# Patient Record
Sex: Female | Born: 1945
Health system: Southern US, Community
[De-identification: ages and names within clinical notes are randomized; demographics above are authoritative.]

## PROBLEM LIST (undated history)

## (undated) DIAGNOSIS — E669 Obesity, unspecified: Secondary | ICD-10-CM

## (undated) DIAGNOSIS — I350 Nonrheumatic aortic (valve) stenosis: Secondary | ICD-10-CM

## (undated) DIAGNOSIS — Z953 Presence of xenogenic heart valve: Secondary | ICD-10-CM

## (undated) DIAGNOSIS — M810 Age-related osteoporosis without current pathological fracture: Secondary | ICD-10-CM

## (undated) DIAGNOSIS — I10 Essential (primary) hypertension: Secondary | ICD-10-CM

## (undated) DIAGNOSIS — R0600 Dyspnea, unspecified: Secondary | ICD-10-CM

## (undated) DIAGNOSIS — E119 Type 2 diabetes mellitus without complications: Secondary | ICD-10-CM

## (undated) DIAGNOSIS — H269 Unspecified cataract: Secondary | ICD-10-CM

## (undated) HISTORY — DX: Essential (primary) hypertension: I10

## (undated) HISTORY — PX: WRIST SURGERY: SHX841

## (undated) HISTORY — DX: Age-related osteoporosis without current pathological fracture: M81.0

## (undated) HISTORY — PX: FRACTURE SURGERY: SHX138

## (undated) HISTORY — DX: Type 2 diabetes mellitus without complications: E11.9

## (undated) HISTORY — DX: Nonrheumatic aortic (valve) stenosis: I35.0

## (undated) HISTORY — DX: Unspecified cataract: H26.9

## (undated) HISTORY — PX: ABDOMINAL HYSTERECTOMY: SHX81

## (undated) HISTORY — PX: APPENDECTOMY: SHX54

## (undated) HISTORY — PX: TOTAL ABDOMINAL HYSTERECTOMY: SHX209

---

## 1998-06-06 ENCOUNTER — Encounter: Admission: RE | Admit: 1998-06-06 | Discharge: 1998-09-04 | Payer: Self-pay | Admitting: Orthopedic Surgery

## 1999-05-01 ENCOUNTER — Other Ambulatory Visit: Admission: RE | Admit: 1999-05-01 | Discharge: 1999-05-01 | Payer: Self-pay | Admitting: Obstetrics and Gynecology

## 2000-05-16 ENCOUNTER — Other Ambulatory Visit: Admission: RE | Admit: 2000-05-16 | Discharge: 2000-05-16 | Payer: Self-pay | Admitting: Family Medicine

## 2002-05-20 ENCOUNTER — Other Ambulatory Visit: Admission: RE | Admit: 2002-05-20 | Discharge: 2002-05-20 | Payer: Self-pay | Admitting: Family Medicine

## 2003-07-13 ENCOUNTER — Other Ambulatory Visit: Admission: RE | Admit: 2003-07-13 | Discharge: 2003-07-13 | Payer: Self-pay | Admitting: Family Medicine

## 2006-05-06 ENCOUNTER — Other Ambulatory Visit: Admission: RE | Admit: 2006-05-06 | Discharge: 2006-05-06 | Payer: Self-pay | Admitting: Family Medicine

## 2008-12-09 ENCOUNTER — Encounter: Admission: RE | Admit: 2008-12-09 | Discharge: 2008-12-09 | Payer: Self-pay | Admitting: Orthopedic Surgery

## 2008-12-24 ENCOUNTER — Ambulatory Visit (HOSPITAL_COMMUNITY): Admission: RE | Admit: 2008-12-24 | Discharge: 2008-12-26 | Payer: Self-pay | Admitting: Orthopedic Surgery

## 2009-02-08 ENCOUNTER — Encounter: Admission: RE | Admit: 2009-02-08 | Discharge: 2009-05-09 | Payer: Self-pay | Admitting: Orthopedic Surgery

## 2009-05-10 ENCOUNTER — Encounter: Admission: RE | Admit: 2009-05-10 | Discharge: 2009-05-18 | Payer: Self-pay | Admitting: Orthopedic Surgery

## 2010-06-05 ENCOUNTER — Encounter: Admission: RE | Admit: 2010-06-05 | Discharge: 2010-06-05 | Payer: Self-pay | Admitting: Orthopedic Surgery

## 2010-07-11 ENCOUNTER — Encounter: Admission: RE | Admit: 2010-07-11 | Discharge: 2010-10-09 | Payer: Self-pay | Admitting: Orthopedic Surgery

## 2011-03-06 LAB — GLUCOSE, CAPILLARY
Glucose-Capillary: 147 mg/dL — ABNORMAL HIGH (ref 70–99)
Glucose-Capillary: 163 mg/dL — ABNORMAL HIGH (ref 70–99)
Glucose-Capillary: 176 mg/dL — ABNORMAL HIGH (ref 70–99)
Glucose-Capillary: 181 mg/dL — ABNORMAL HIGH (ref 70–99)
Glucose-Capillary: 187 mg/dL — ABNORMAL HIGH (ref 70–99)
Glucose-Capillary: 197 mg/dL — ABNORMAL HIGH (ref 70–99)

## 2011-03-06 LAB — CBC
HCT: 37.9 % (ref 36.0–46.0)
Hemoglobin: 11.4 g/dL — ABNORMAL LOW (ref 12.0–15.0)
Hemoglobin: 12.8 g/dL (ref 12.0–15.0)
MCV: 91.6 fL (ref 78.0–100.0)
RBC: 4.19 MIL/uL (ref 3.87–5.11)
RDW: 13.7 % (ref 11.5–15.5)

## 2011-03-06 LAB — URINALYSIS, ROUTINE W REFLEX MICROSCOPIC
Hgb urine dipstick: NEGATIVE
Ketones, ur: 15 mg/dL — AB
Nitrite: NEGATIVE
Protein, ur: NEGATIVE mg/dL
Specific Gravity, Urine: 1.03 (ref 1.005–1.030)
pH: 7 (ref 5.0–8.0)

## 2011-03-06 LAB — COMPREHENSIVE METABOLIC PANEL
AST: 16 U/L (ref 0–37)
BUN: 17 mg/dL (ref 6–23)
Sodium: 141 mEq/L (ref 135–145)

## 2011-03-06 LAB — BASIC METABOLIC PANEL
BUN: 14 mg/dL (ref 6–23)
CO2: 29 mEq/L (ref 19–32)
Calcium: 8.8 mg/dL (ref 8.4–10.5)
Creatinine, Ser: 0.8 mg/dL (ref 0.4–1.2)
Creatinine, Ser: 0.85 mg/dL (ref 0.4–1.2)
GFR calc Af Amer: >60 mL/min (ref >60)
Glucose, Bld: 201 mg/dL — ABNORMAL HIGH (ref 70–99)
Sodium: 130 mEq/L — ABNORMAL LOW (ref 135–145)

## 2011-03-06 LAB — TYPE AND SCREEN: ABO/RH(D): A POS

## 2011-03-06 LAB — PROTIME-INR
INR: 1 (ref 0.00–1.49)
Prothrombin Time: 13.9 seconds (ref 11.6–15.2)

## 2011-03-06 LAB — APTT: aPTT: 31 seconds (ref 24–37)

## 2011-03-30 ENCOUNTER — Other Ambulatory Visit (INDEPENDENT_AMBULATORY_CARE_PROVIDER_SITE_OTHER): Payer: Medicare Other

## 2011-03-30 DIAGNOSIS — R0989 Other specified symptoms and signs involving the circulatory and respiratory systems: Secondary | ICD-10-CM

## 2011-04-03 NOTE — Op Note (Signed)
NAMEXIANNA, SIVERLING NO.:  [id number]   MEDICAL RECORD NO.:  [id number]          PATIENT TYPE:  INP   LOCATION:  5016                         FACILITY:  [facility]   PHYSICIAN:  [name], M.D. DATE OF BIRTH:  05-30-1946   DATE OF PROCEDURE:  12/24/2008  DATE OF DISCHARGE:                               OPERATIVE REPORT   PREOPERATIVE DIAGNOSIS:  Right shoulder proximal humerus fracture,  nonunion.   POSTOPERATIVE DIAGNOSIS:  Right shoulder proximal humerus fracture,  nonunion.   PROCEDURE PERFORMED:  Right shoulder hemiarthroplasty using [employer]  Fx System.   ATTENDING SURGEON:  [name], MD.   ASSISTANT:  Donnie Coffin. Dixon, PA-C   ANESTHESIA:  General anesthesia was used plus interscalene block.   ESTIMATED BLOOD LOSS:  Minimal.   FLUID REPLACEMENT:  1500 mL of crystalloid.   INSTRUMENT COUNTS:  Correct.   COMPLICATIONS:  None.   Preoperative antibiotics were given.   INDICATIONS:  The patient is a 65 year old female with a history of  right shoulder injury 10 years ago.  The patient has had a manipulation  under anesthesia.  At that time, has had some pain in the shoulder and  difficulty lying on since that time, but recently fell last week and had  an immediate decline in her function and had significant increase in her  pain where she is essentially debilitated by her pain.  She presented to  Orthopedics where she was noted to have what looked to be a nonunion of  the proximal humerus fracture with significant erosion of the  subchondral bone up under the humeral head and malrotation of the  humeral head both in cocking into  more varus and also into  retroversion.  Based on the poor position of the patient's humeral head  and the fact that it appeared she had an established nonunion, we talked  to the patient regarding options for treatment and recommended surgical  management.  She agreed and informed consent was obtained.   DESCRIPTION OF PROCEDURE:  After adequate level of anesthesia was  achieved, the patient was positioned in the modified beach chair  position.  The right shoulder was sterilely prepped and draped in the  usual manner.  We utilized a deltopectoral approach, starting at the  coracoid process, extended down to the anterior humeral shaft using a 10  blade.  Dissection down through subcutaneous using Bovie.  Cephalic vein  identified, taken laterally with the deltoid, pectoralis taken medially,  __________ released.  Conjoined tendon taken medially and identified the  bicipital groove.  We incised the soft tissue before the biceps groove  and then osteotomized the lesser tuberosity with the subscapularis  attached to it.  We freed up the subscapularis from surrounding capsular  tissue and the undersurface of the coracoid.  Placed two #2 FiberWire  sutures in the modified W stitch with suture limbs coming out over the  top of the greater tuberosity at the junction of the tendon and the  bone.  At this point, we went ahead and identified the greater  tuberosity and osteotomized that from the humeral head and placed two #2  FiberWire sutures in the modified W stitch in that at the tendon bone  junction as well, coming out over the top of the greater tuberosity.  Next, we went ahead and removed the humeral head.  We sized it to size  44.18.  We then prepared the humerus with sequential reaming up to size  10 and then placed a 10 body with a 44.18 head, and noted that to be  appropriately balanced and appropriate length at 5-1/2 laser marks to  the bicipital groove bone interface.  At this point, we went ahead and  thoroughly irrigated and removed all extraneous debris.  We went ahead  at this point and cemented the stem into place after placing two #2  FiberWire sutures through drill holes in the anterior humeral shaft.  Once the cement was allowed to harden, we placed the 44.18 head and  impacted  that into place.  The anterior fin had been aligned with the  biceps groove.  We then extensively bone grafted the anterior portion of  prosthesis.  We brought thearound the world suture through the medial  fin and lateral to the greater tuberosity and medial to the lesser  tuberosity.  We then placed two #2 FiberWire sutures in a figure-of-  eight into the rotator interval to secure the rotator interval and to  reinforce the upper portion of the tuberosity-to-tuberosity repair.  We  tied our tuberosity-to-tuberosity sutures.  We placed our shaft sutures  up through the subscapularis into the rotator cuff.  We tied around the  world stitch and we tied our figure-of-eight sutures, reinforcing the  rotator interval area.  We were happy with the soft tissue closure,  which was very tight.  Everything moved as a unit with arm in extension.  In external rotation, I could externally rotate to 30 degrees with no  problem.  I can forward flex up easily pass 90 degrees and internal  rotation easily to the abdomen.  We thoroughly irrigated and closed the  deltopectoral interval with 0 Vicryl suture followed by 2-0 Vicryl  subcutaneous closure and 4-0 Monocryl for skin.  Steri-Strips were  applied followed by sterile dressing.  The patient tolerated the surgery  well.      [name], M.D.  Electronically Signed     SRN/MEDQ  D:  12/24/2008  T:  12/25/2008  Job:  [id number]

## 2011-04-03 NOTE — H&P (Signed)
Toni Cooper, Toni Cooper NO.:  [id number]   MEDICAL RECORD NO.:  [id number]          PATIENT TYPE:  INP   LOCATION:  NA                           FACILITY:  [facility]   PHYSICIAN:  Almedia Balls. Toni Cooper, M.D. DATE OF BIRTH:  28-Jun-1946   DATE OF ADMISSION:  DATE OF DISCHARGE:                              HISTORY & PHYSICAL   CHIEF COMPLAINT:  Right shoulder pain.   HISTORY OF PRESENT ILLNESS:  The patient is a pleasant 65 year old  female with history of several injuries to the right shoulder.  The  patient states she had a recent fall on December 02, 2008, when she  landed directly on her right shoulder, complained about immediate pain  and inability to move that right shoulder secondary to pain, was  referred to our office, seen by Dr. Lequita Cooper and seen by Dr. Ranell Cooper.  After a CT scan demonstrated nonunion of a proximal humerus fracture  along with a recent humerus fracture, the patient elected to have  surgical management for that right shoulder.   MEDICAL HISTORY:  Hypertension and diabetes.   SURGICAL HISTORY:  The patient has a history of hysterectomy and  appendectomy.   CURRENT MEDICATIONS:  Lisinopril, hydrochlorothiazide, Glucophage,  Niaspan, Lipitor, Flonase, lorazepam, insulin, Ecotrin, and Ocuvite.   ALLERGIES:  SULFA.   SOCIAL HISTORY:  The patient does not smoke or use alcohol.  The patient  of [facility].   REVIEW OF SYSTEMS:  Pain with range of motion of the right upper  extremity.   PHYSICAL EXAMINATION:  Pulse 90, respirations 18, and blood pressure  142/82 here in the office today.  The patient is a healthy and alert,  appropriate 65 year old female, in no acute distress.  Pleasant mood and  affect, oriented x3.  Examination of the cervical spine shows no  tenderness to palpation over the paraspinal muscle or spinous process.  She has full range of motion and difficulty.  Cranial nerves II-XII  grossly intact.  Examination of  the right upper extremity shows moderate  edema and ecchymosis extending down to the right elbow and right hand  secondary to known fracture.  This was not taken through tremendous  range of motion secondary to known fracture.  Capillary fill was 2  seconds.  Breath sounds are equal bilaterally.  Abdomen is nontender and  nondistended with active bowel sounds.  She has no rashes or edema.  The  rest of physical exam is negative.   X-ray showed nonunion of an old proximal humerus fracture along with a  recent fracture of the nonunion area.   IMPRESSION:  Nonunion right proximal humerus fracture.   PLAN:  Dr.  Ranell Cooper __________ treatment plan which is recommending  surgical management for this humerus.  The patient elected to have  surgery, and we will get her cleared by her medical doctor and plan on  having surgery on December 24, 2008.      Toni Cooper, P.A.      Almedia Balls. Toni Cooper, M.D.  Electronically Signed    TBD/MEDQ  D:  12/16/2008  T:  12/16/2008  Job:  [id number]

## 2011-04-05 NOTE — Procedures (Unsigned)
CAROTID DUPLEX EXAM  INDICATION:  Bilateral carotid bruits.  HISTORY: Diabetes:  Yes. Cardiac:  Murmur. Hypertension:  Yes. Smoking:  No. Previous Surgery:  No. CV History:  Currently asymptomatic. Amaurosis Fugax No, Paresthesias No, Hemiparesis No                                      RIGHT             LEFT Brachial systolic pressure:         158               148 Brachial Doppler waveforms:         Normal            Normal Vertebral direction of flow:        Antegrade         Antegrade DUPLEX VELOCITIES (cm/sec) CCA peak systolic                   65                68 ECA peak systolic                   94                92 ICA peak systolic                   50                62 ICA end diastolic                   14                14 PLAQUE MORPHOLOGY: PLAQUE AMOUNT:                      None              None PLAQUE LOCATION:  IMPRESSION:  No hemodynamically significant stenoses of the bilateral carotid arteries noted.  ___________________________________________ Larina Earthly, M.D.  CH/MEDQ  D:  03/30/2011  T:  03/30/2011  Job:  [id number]

## 2012-08-28 LAB — HM DEXA SCAN

## 2012-09-16 ENCOUNTER — Ambulatory Visit: Payer: Medicare Other | Attending: Orthopedic Surgery | Admitting: Physical Therapy

## 2012-09-16 DIAGNOSIS — M25539 Pain in unspecified wrist: Secondary | ICD-10-CM | POA: Insufficient documentation

## 2012-09-16 DIAGNOSIS — IMO0001 Reserved for inherently not codable concepts without codable children: Secondary | ICD-10-CM | POA: Insufficient documentation

## 2012-09-16 DIAGNOSIS — S42309S Unspecified fracture of shaft of humerus, unspecified arm, sequela: Secondary | ICD-10-CM | POA: Insufficient documentation

## 2012-09-16 DIAGNOSIS — W19XXXS Unspecified fall, sequela: Secondary | ICD-10-CM | POA: Insufficient documentation

## 2012-09-16 DIAGNOSIS — R5381 Other malaise: Secondary | ICD-10-CM | POA: Insufficient documentation

## 2012-09-18 ENCOUNTER — Ambulatory Visit: Payer: Medicare Other | Admitting: Physical Therapy

## 2012-09-22 ENCOUNTER — Ambulatory Visit: Payer: Medicare Other | Attending: Orthopedic Surgery | Admitting: Physical Therapy

## 2012-09-22 DIAGNOSIS — W19XXXS Unspecified fall, sequela: Secondary | ICD-10-CM | POA: Insufficient documentation

## 2012-09-22 DIAGNOSIS — IMO0001 Reserved for inherently not codable concepts without codable children: Secondary | ICD-10-CM | POA: Insufficient documentation

## 2012-09-22 DIAGNOSIS — M25539 Pain in unspecified wrist: Secondary | ICD-10-CM | POA: Insufficient documentation

## 2012-09-22 DIAGNOSIS — R5381 Other malaise: Secondary | ICD-10-CM | POA: Insufficient documentation

## 2012-09-25 ENCOUNTER — Ambulatory Visit: Payer: Medicare Other | Admitting: Physical Therapy

## 2012-09-29 ENCOUNTER — Ambulatory Visit: Payer: Medicare Other | Admitting: Physical Therapy

## 2012-10-01 ENCOUNTER — Ambulatory Visit: Payer: Medicare Other | Admitting: Physical Therapy

## 2012-10-06 ENCOUNTER — Ambulatory Visit: Payer: Medicare Other | Admitting: Physical Therapy

## 2012-10-09 ENCOUNTER — Ambulatory Visit: Payer: Medicare Other | Admitting: Physical Therapy

## 2012-10-13 ENCOUNTER — Ambulatory Visit: Payer: Medicare Other | Admitting: Physical Therapy

## 2012-10-15 ENCOUNTER — Encounter: Payer: Medicare Other | Admitting: Physical Therapy

## 2012-10-20 ENCOUNTER — Ambulatory Visit: Payer: Medicare Other | Attending: Orthopedic Surgery | Admitting: Physical Therapy

## 2012-10-20 DIAGNOSIS — IMO0001 Reserved for inherently not codable concepts without codable children: Secondary | ICD-10-CM | POA: Insufficient documentation

## 2012-10-20 DIAGNOSIS — M25539 Pain in unspecified wrist: Secondary | ICD-10-CM | POA: Insufficient documentation

## 2012-10-20 DIAGNOSIS — R5381 Other malaise: Secondary | ICD-10-CM | POA: Insufficient documentation

## 2012-10-20 DIAGNOSIS — W19XXXS Unspecified fall, sequela: Secondary | ICD-10-CM | POA: Insufficient documentation

## 2012-10-23 ENCOUNTER — Ambulatory Visit: Payer: Medicare Other | Admitting: Physical Therapy

## 2012-10-27 ENCOUNTER — Ambulatory Visit: Payer: Medicare Other | Admitting: Physical Therapy

## 2012-10-29 ENCOUNTER — Ambulatory Visit: Payer: Medicare Other | Admitting: Physical Therapy

## 2012-11-03 ENCOUNTER — Encounter: Payer: Medicare Other | Admitting: Physical Therapy

## 2012-11-06 ENCOUNTER — Encounter: Payer: Medicare Other | Admitting: Physical Therapy

## 2013-02-17 ENCOUNTER — Encounter: Payer: Self-pay | Admitting: Family Medicine

## 2013-02-26 ENCOUNTER — Encounter: Payer: Self-pay | Admitting: Nurse Practitioner

## 2013-03-02 ENCOUNTER — Other Ambulatory Visit: Payer: Self-pay

## 2013-08-13 ENCOUNTER — Other Ambulatory Visit: Payer: Self-pay | Admitting: Nurse Practitioner

## 2013-08-14 NOTE — Telephone Encounter (Signed)
Lat seen 10/13  MMM 

## 2013-09-07 ENCOUNTER — Ambulatory Visit (INDEPENDENT_AMBULATORY_CARE_PROVIDER_SITE_OTHER): Payer: PRIVATE HEALTH INSURANCE | Admitting: Nurse Practitioner

## 2013-09-07 ENCOUNTER — Encounter (INDEPENDENT_AMBULATORY_CARE_PROVIDER_SITE_OTHER): Payer: Self-pay

## 2013-09-07 ENCOUNTER — Encounter: Payer: Self-pay | Admitting: Nurse Practitioner

## 2013-09-07 VITALS — BP 144/73 | HR 75 | Temp 98.6°F | Ht 65.0 in | Wt 209.0 lb

## 2013-09-07 DIAGNOSIS — Z23 Encounter for immunization: Secondary | ICD-10-CM

## 2013-09-07 DIAGNOSIS — E785 Hyperlipidemia, unspecified: Secondary | ICD-10-CM

## 2013-09-07 DIAGNOSIS — E1169 Type 2 diabetes mellitus with other specified complication: Secondary | ICD-10-CM | POA: Insufficient documentation

## 2013-09-07 DIAGNOSIS — I10 Essential (primary) hypertension: Secondary | ICD-10-CM

## 2013-09-07 DIAGNOSIS — E119 Type 2 diabetes mellitus without complications: Secondary | ICD-10-CM | POA: Insufficient documentation

## 2013-09-07 DIAGNOSIS — R0989 Other specified symptoms and signs involving the circulatory and respiratory systems: Secondary | ICD-10-CM

## 2013-09-07 DIAGNOSIS — M810 Age-related osteoporosis without current pathological fracture: Secondary | ICD-10-CM

## 2013-09-07 MED ORDER — ALENDRONATE SODIUM 70 MG PO TABS: 70.0000 mg | ORAL_TABLET | ORAL | Status: DC

## 2013-09-07 NOTE — Patient Instructions (Signed)

## 2013-09-07 NOTE — Addendum Note (Signed)
Addended by: Azalee Course on: 09/07/2013 09:29 AM   Modules accepted: Orders

## 2013-09-07 NOTE — Progress Notes (Addendum)
Subjective:    Patient ID: Toni Cooper, female    DOB: 1946/10/26, 67 y.o.   MRN: [id number]  Hypertension This is a chronic problem. The current episode started more than 1 year ago. The problem is unchanged. The problem is controlled. Pertinent negatives include no blurred vision, chest pain, headaches, neck pain, palpitations, peripheral edema or shortness of breath. There are no associated agents to hypertension. Risk factors for coronary artery disease include diabetes mellitus, dyslipidemia, family history, obesity and post-menopausal state. Past treatments include ACE inhibitors and diuretics. The current treatment provides no improvement. There are no compliance problems.   Hyperlipidemia This is a chronic problem. The current episode started more than 1 year ago. The problem is controlled. Recent lipid tests were reviewed and are normal. Exacerbating diseases include diabetes and obesity. She has no history of hypothyroidism. There are no known factors aggravating her hyperlipidemia. Pertinent negatives include no chest pain or shortness of breath. Current antihyperlipidemic treatment includes statins. The current treatment provides moderate improvement of lipids. Compliance problems include adherence to diet and adherence to exercise.  Risk factors for coronary artery disease include diabetes mellitus, family history, hypertension, obesity and post-menopausal.  Diabetes She presents for her follow-up diabetic visit. She has type 2 diabetes mellitus. No MedicAlert identification noted. Her disease course has been fluctuating. Pertinent negatives for hypoglycemia include no headaches. Pertinent negatives for diabetes include no blurred vision and no chest pain. Symptoms are improving. Risk factors for coronary artery disease include dyslipidemia, family history, hypertension, obesity and post-menopausal. Current diabetic treatment includes oral agent (monotherapy). Her weight is stable. She is  following a diabetic diet. When asked about meal planning, she reported none. She has not had a previous visit with a dietician. She rarely participates in exercise. There is no change in her home blood glucose trend. Her breakfast blood glucose is taken between 8-9 am. Her breakfast blood glucose range is generally 110-130 mg/dl. Her lunch blood glucose range is generally 130-140 mg/dl. Her dinner blood glucose range is generally 130-140 mg/dl. Her highest blood glucose is >200 mg/dl. Her overall blood glucose range is 130-140 mg/dl. She does not see a podiatrist.Eye exam is current.  Osteoporosis Doing well on fosamax   Review of Systems  Eyes: Negative for blurred vision.  Respiratory: Negative for shortness of breath.   Cardiovascular: Negative for chest pain and palpitations.  Musculoskeletal: Negative for neck pain.  Neurological: Negative for headaches.       Objective:   Physical Exam  Constitutional: She is oriented to person, place, and time. She appears well-developed and well-nourished.  HENT:  Nose: Nose normal.  Mouth/Throat: Oropharynx is clear and moist.  Eyes: EOM are normal.  Neck: Trachea normal, normal range of motion and full passive range of motion without pain. Neck supple. No JVD present. Carotid bruit is not present. No thyromegaly present.  bil 2/6 carotid bruits  Cardiovascular: Normal rate, regular rhythm and intact distal pulses.  Exam reveals no gallop and no friction rub.   Murmur (2/6 systolic) heard. Pulmonary/Chest: Effort normal and breath sounds normal.  Abdominal: Soft. Bowel sounds are normal. She exhibits no distension and no mass. There is no tenderness.  Musculoskeletal: Normal range of motion.  Lymphadenopathy:    She has no cervical adenopathy.  Neurological: She is alert and oriented to person, place, and time. She has normal reflexes.  Skin: Skin is warm and dry.  Psychiatric: She has a normal mood and affect. Her behavior is normal.  Judgment  and thought content normal.     BP 144/73  Pulse 75  Temp(Src) 98.6 F (37 C) (Oral)  Ht 5\' 5"  (1.651 m)  Wt 209 lb (94.802 kg)  BMI 34.78 kg/m2      Assessment & Plan:   1. Hypertension   2. Hyperlipidemia   3. Type II or unspecified type diabetes mellitus without mention of complication, not stated as uncontrolled   4. Osteoporosis   5. Carotid bruit    Orders Placed This Encounter  Procedures  . Carotid duplex    Standing Status: Future     Number of Occurrences:      Standing Expiration Date: 09/07/2014    Order Specific Question:  Laterality    Answer:  Bilateral    Order Specific Question:  Where should this test be performed:    Answer:  [facility]    Continue all meds Labs reviewed - brought copy of labs to visit Diet and exercise encouraged Health maintenance reviewed Follow up in 3 months  Mary-Margaret Daphine Deutscher, FNP

## 2013-09-07 NOTE — Addendum Note (Signed)
Addended by: Bennie Pierini on: 09/07/2013 09:15 AM   Modules accepted: Orders

## 2013-10-27 ENCOUNTER — Other Ambulatory Visit: Payer: Self-pay | Admitting: Dermatology

## 2013-11-19 DIAGNOSIS — I350 Nonrheumatic aortic (valve) stenosis: Secondary | ICD-10-CM

## 2013-11-19 HISTORY — DX: Nonrheumatic aortic (valve) stenosis: I35.0

## 2013-12-08 ENCOUNTER — Encounter: Payer: Self-pay | Admitting: General Practice

## 2013-12-08 ENCOUNTER — Ambulatory Visit (INDEPENDENT_AMBULATORY_CARE_PROVIDER_SITE_OTHER): Payer: PRIVATE HEALTH INSURANCE | Admitting: General Practice

## 2013-12-08 VITALS — BP 133/70 | HR 82 | Temp 98.1°F | Ht 65.0 in | Wt 208.5 lb

## 2013-12-08 DIAGNOSIS — J019 Acute sinusitis, unspecified: Secondary | ICD-10-CM

## 2013-12-08 MED ORDER — AZITHROMYCIN 250 MG PO TABS: ORAL_TABLET | ORAL | Status: DC

## 2013-12-08 NOTE — Patient Instructions (Signed)

## 2013-12-08 NOTE — Progress Notes (Signed)
   Subjective:    Patient ID: Toni Cooper, female    DOB: Sep 10, 1946, 68 y.o.   MRN: [id number]  Cough This is a new problem. The current episode started in the past 7 days. The problem has been unchanged. The cough is productive of sputum. Associated symptoms include nasal congestion and postnasal drip. Pertinent negatives include no chest pain, chills, fever, headaches, sore throat, shortness of breath or wheezing. The symptoms are aggravated by lying down. She has tried OTC cough suppressant for the symptoms. Her past medical history is significant for bronchitis. There is no history of asthma or pneumonia.  Sinusitis This is a new problem. The current episode started in the past 7 days. The problem has been gradually worsening since onset. Associated symptoms include congestion, coughing and sinus pressure. Pertinent negatives include no chills, headaches, shortness of breath or sore throat. Past treatments include acetaminophen. The treatment provided mild relief.      Review of Systems  Constitutional: Negative for fever and chills.  HENT: Positive for congestion, postnasal drip and sinus pressure. Negative for sore throat.   Respiratory: Positive for cough. Negative for chest tightness, shortness of breath and wheezing.   Cardiovascular: Negative for chest pain and palpitations.  Neurological: Negative for dizziness, weakness and headaches.       Objective:   Physical Exam  Constitutional: She appears well-developed and well-nourished.  HENT:  Head: Normocephalic and atraumatic.  Nose: Right sinus exhibits maxillary sinus tenderness and frontal sinus tenderness. Left sinus exhibits maxillary sinus tenderness and frontal sinus tenderness.  Mouth/Throat: Posterior oropharyngeal erythema present.  Cardiovascular: Normal rate, regular rhythm and normal heart sounds.   No murmur heard. Pulmonary/Chest: Effort normal and breath sounds normal. No respiratory distress. She exhibits no  tenderness.  Neurological: She is alert.  Skin: Skin is warm and dry. No rash noted.  Psychiatric: She has a normal mood and affect.          Assessment & Plan:  1. Sinusitis, acute azithromycin (ZITHROMAX) 250 MG tablet; Take as directed  Dispense: 6 tablet; Refill: 0 Increase fluids RTO if symptoms worsen or unresolved Patient verbalized understanding Erby Pian, FNP-C

## 2014-02-25 LAB — HEMOGLOBIN A1C: HEMOGLOBIN A1C: 6.9 % — AB (ref 4.0–6.0)

## 2014-02-25 LAB — LIPID PANEL WITH LDL/HDL RATIO
ALT: 32
AST: 33 U/L
BUN / CREAT RATIO: 27
BUN, Bld: 27
Cholesterol: 100 mg/dL (ref 0–200)
Creatine, Serum: 1
GLUCOSE: 90
HDL: 41 mg/dL (ref 35–70)
LDL Calculated: 44 mg/dL
LDL/HDL RATIO: 1.1
Potassium: 4.3 mmol/L
Sodium: 142 mmol/L (ref 137–147)
Triglycerides: 74

## 2014-03-08 ENCOUNTER — Encounter: Payer: Self-pay | Admitting: Nurse Practitioner

## 2014-03-08 ENCOUNTER — Ambulatory Visit (INDEPENDENT_AMBULATORY_CARE_PROVIDER_SITE_OTHER): Payer: PRIVATE HEALTH INSURANCE | Admitting: Nurse Practitioner

## 2014-03-08 VITALS — BP 144/76 | HR 76 | Temp 98.0°F | Ht 65.0 in | Wt 211.0 lb

## 2014-03-08 DIAGNOSIS — E785 Hyperlipidemia, unspecified: Secondary | ICD-10-CM

## 2014-03-08 DIAGNOSIS — E119 Type 2 diabetes mellitus without complications: Secondary | ICD-10-CM

## 2014-03-08 DIAGNOSIS — M810 Age-related osteoporosis without current pathological fracture: Secondary | ICD-10-CM

## 2014-03-08 DIAGNOSIS — I10 Essential (primary) hypertension: Secondary | ICD-10-CM

## 2014-03-08 DIAGNOSIS — R011 Cardiac murmur, unspecified: Secondary | ICD-10-CM

## 2014-03-08 NOTE — Patient Instructions (Signed)

## 2014-03-08 NOTE — Progress Notes (Addendum)
Subjective:    Patient ID: Toni Cooper, female    DOB: 03/24/1946, 68 y.o.   MRN: [id number]  Patient here today for follow up of chronic medical problems. Patient just saw endocrinologist and had labs work done.   Hypertension This is a chronic problem. The current episode started more than 1 year ago. The problem is unchanged. The problem is controlled. Pertinent negatives include no blurred vision, chest pain, headaches, neck pain, palpitations, peripheral edema or shortness of breath. There are no associated agents to hypertension. Risk factors for coronary artery disease include diabetes mellitus, dyslipidemia, family history, obesity and post-menopausal state. Past treatments include ACE inhibitors and diuretics. The current treatment provides no improvement. There are no compliance problems.   Hyperlipidemia This is a chronic problem. The current episode started more than 1 year ago. The problem is controlled. Recent lipid tests were reviewed and are normal. Exacerbating diseases include diabetes and obesity. She has no history of hypothyroidism. There are no known factors aggravating her hyperlipidemia. Pertinent negatives include no chest pain or shortness of breath. Current antihyperlipidemic treatment includes statins. The current treatment provides moderate improvement of lipids. Compliance problems include adherence to diet and adherence to exercise.  Risk factors for coronary artery disease include diabetes mellitus, family history, hypertension, obesity and post-menopausal.  Diabetes She presents for her follow-up diabetic visit. She has type 2 diabetes mellitus. No MedicAlert identification noted. Her disease course has been fluctuating. Pertinent negatives for hypoglycemia include no headaches. Pertinent negatives for diabetes include no blurred vision and no chest pain. Symptoms are improving. Risk factors for coronary artery disease include dyslipidemia, family history, hypertension,  obesity and post-menopausal. Current diabetic treatment includes oral agent (monotherapy). Her weight is stable. She is following a diabetic diet. When asked about meal planning, she reported none. She has not had a previous visit with a dietician. She rarely participates in exercise. There is no change in her home blood glucose trend. Her breakfast blood glucose is taken between 8-9 am. Her breakfast blood glucose range is generally 110-130 mg/dl. Her lunch blood glucose range is generally 130-140 mg/dl. Her dinner blood glucose range is generally 130-140 mg/dl. Her highest blood glucose is >200 mg/dl. Her overall blood glucose range is 130-140 mg/dl. She does not see a podiatrist.Eye exam is current.  Osteoporosis Doing well on fosamax   Review of Systems  Eyes: Negative for blurred vision.  Respiratory: Negative for shortness of breath.   Cardiovascular: Negative for chest pain and palpitations.  Musculoskeletal: Negative for neck pain.  Neurological: Negative for headaches.       Objective:   Physical Exam  Constitutional: She is oriented to person, place, and time. She appears well-developed and well-nourished.  HENT:  Nose: Nose normal.  Mouth/Throat: Oropharynx is clear and moist.  Eyes: EOM are normal.  Neck: Trachea normal, normal range of motion and full passive range of motion without pain. Neck supple. No JVD present. Carotid bruit is not present. No thyromegaly present.  bil 2/6 carotid bruits  Cardiovascular: Normal rate, regular rhythm and intact distal pulses.  Exam reveals no gallop and no friction rub.   Murmur (3/6 systolic) heard. Pulmonary/Chest: Effort normal and breath sounds normal.  Abdominal: Soft. Bowel sounds are normal. She exhibits no distension and no mass. There is no tenderness.  Musculoskeletal: Normal range of motion.  Lymphadenopathy:    She has no cervical adenopathy.  Neurological: She is alert and oriented to person, place, and time. She has normal  reflexes.  Skin: Skin is warm and dry.  Psychiatric: She has a normal mood and affect. Her behavior is normal. Judgment and thought content normal.     BP 144/76  Pulse 76  Temp(Src) 98 F (36.7 C) (Oral)  Ht 5\' 5"  (1.651 m)  Wt 211 lb (95.709 kg)  BMI 35.11 kg/m2      Assessment & Plan:   1. Type II or unspecified type diabetes mellitus without mention of complication, not stated as uncontrolled   2. Osteoporosis   3. Hypertension   4. Hyperlipidemia    Cardiac referral Labs reviewed at [place] maintenance reviewed Diet and exercise encouraged Continue all meds Follow up  In 3 months   [place], FNP

## 2014-03-08 NOTE — Addendum Note (Signed)
Addended by: [name] on: 03/08/2014 09:12 AM   Modules accepted: Orders

## 2014-05-04 ENCOUNTER — Encounter: Payer: Self-pay | Admitting: *Deleted

## 2014-05-12 ENCOUNTER — Encounter: Payer: Self-pay | Admitting: Cardiology

## 2014-05-12 ENCOUNTER — Ambulatory Visit (INDEPENDENT_AMBULATORY_CARE_PROVIDER_SITE_OTHER): Payer: Medicare Other | Admitting: Cardiology

## 2014-05-12 VITALS — BP 140/77 | HR 69 | Ht 64.5 in | Wt 202.0 lb

## 2014-05-12 DIAGNOSIS — R011 Cardiac murmur, unspecified: Secondary | ICD-10-CM | POA: Insufficient documentation

## 2014-05-12 DIAGNOSIS — E785 Hyperlipidemia, unspecified: Secondary | ICD-10-CM

## 2014-05-12 DIAGNOSIS — I2510 Atherosclerotic heart disease of native coronary artery without angina pectoris: Secondary | ICD-10-CM | POA: Insufficient documentation

## 2014-05-12 NOTE — Patient Instructions (Signed)
The current medical regimen is effective;  continue present plan and medications.  Your physician has requested that you have an echocardiogram. Echocardiography is a painless test that uses sound waves to create images of your heart. It provides your doctor with information about the size and shape of your heart and how well your heart's chambers and valves are working. This procedure takes approximately one hour. There are no restrictions for this procedure.  Follow up in 1 year with Dr Percival Spanish in [place].  You will receive a letter in the mail 2 months before you are due.  Please call us when you receive this letter to schedule your follow up appointment.

## 2014-05-12 NOTE — Progress Notes (Signed)
HPI The patient presents for evaluation of possible carotid bruit. She does have a heart murmur. She said she had carotid Dopplers three or four years ago that demonstrated no disease.  She has been told she's had a heart murmur.  She does not report any echocardiogram. She's never been told she had any coronary disease and doesn't report that she's ever had a stress test. She denies any cardiovascular symptoms. The patient denies any new symptoms such as chest discomfort, neck or arm discomfort. There has been no new shortness of breath, PND or orthopnea. There have been no reported palpitations, presyncope or syncope.  Allergies  Allergen Reactions  . Sulfa Antibiotics     Current Outpatient Prescriptions  Medication Sig Dispense Refill  . alendronate (FOSAMAX) 70 MG tablet Take 1 tablet (70 mg total) by mouth every 7 (seven) days. Take with a full glass of water on an empty stomach.  12 tablet  3  . aspirin 325 MG EC tablet Take 325 mg by mouth daily.      [name] atorvastatin (LIPITOR) 10 MG tablet 10 mg. Take one half pill daily      . beta carotene w/minerals (OCUVITE) tablet Take 1 tablet by mouth daily.      . calcium carbonate (OS-CAL) 600 MG TABS tablet Take 600 mg by mouth 2 (two) times daily with a meal.      . cholecalciferol (VITAMIN D) 1000 UNITS tablet Take 1,000 Units by mouth daily.      [name] diltiazem (CARDIZEM CD) 120 MG 24 hr capsule Take 120 mg by mouth daily.       [name] HUMALOG 100 UNIT/ML injection Using sliding scale.      . hydrochlorothiazide (HYDRODIURIL) 25 MG tablet Take 25 mg by mouth daily.       [name] LANTUS 100 UNIT/ML injection Inject 35 Units into the skin at bedtime.       [name] lisinopril (PRINIVIL,ZESTRIL) 40 MG tablet Take 40 mg by mouth daily.       . metFORMIN (GLUCOPHAGE-XR) 500 MG 24 hr tablet Take 500 mg by mouth 2 (two) times daily.       . Multiple Vitamin (MULTIVITAMIN) capsule Take 1 capsule by mouth daily.      . Omega 3 1000 MG CAPS Take 1,000 mg by mouth  daily.      [name] pyridOXINE (VITAMIN B-6) 100 MG tablet Take 100 mg by mouth daily.      [name] Specialty Vitamins Products (MAGNESIUM, AMINO ACID CHELATE,) 133 MG tablet Take 1 tablet by mouth 2 (two) times daily.      . vitamin C (ASCORBIC ACID) 500 MG tablet Take 500 mg by mouth daily.      [name] TEST test strip Test four times daily.       No current facility-administered medications for this visit.    Past Medical History  Diagnosis Date  . Hypertension   . Diabetes mellitus without complication     Past Surgical History  Procedure Laterality Date  . Abdominal hysterectomy    . Appendectomy    . Fracture surgery      No family history on file.  History   Social History  . Marital Status: Married    Spouse Name: N/A    Number of Children: N/A  . Years of Education: N/A   Occupational History  . Not on file.   Social History Main Topics  . Smoking status: Never Smoker   . Smokeless tobacco:  Not on file  . Alcohol Use: No  . Drug Use: No  . Sexual Activity: Not on file   Other Topics Concern  . Not on file   Social History Narrative  . No narrative on file    ROS:  I saw him, reflux. Otherwise as stated in the HPI and negative for all other systems.  PHYSICAL EXAM BP 140/77  Pulse 69  Ht 5' 4.5" (1.638 m)  Wt 202 lb (91.627 kg)  BMI 34.15 kg/m2 GENERAL:  Well appearing HEENT:  Pupils equal round and reactive, fundi not visualized, oral mucosa unremarkable NECK:  No jugular venous distention, waveform within normal limits, carotid upstroke brisk and symmetric, no bruits, no thyromegaly LYMPHATICS:  No cervical, inguinal adenopathy LUNGS:  Clear to auscultation bilaterally BACK:  No CVA tenderness CHEST:  Unremarkable HEART:  PMI not displaced or sustained,S1 and S2 within normal limits, no S3, no S4, no clicks, no rubs, apical systolic murmur 3/6, mid peaking, radiating out the aortic outflow tract and into the carotids, no change with Valsalva or  position, no diastolicmurmurs ABD:  Flat, positive bowel sounds normal in frequency in pitch, no bruits, no rebound, no guarding, no midline pulsatile mass, no hepatomegaly, no splenomegaly EXT:  2 plus pulses throughout, no edema, no cyanosis no clubbing SKIN:  No rashes no nodules NEURO:  Cranial nerves II through XII grossly intact, motor grossly intact throughout PSYCH:  Cognitively intact, oriented to person place and time   EKG:  Sinus rhythm, rate 65, axis within normal limits, intervals within normal limits, no acute ST-T wave changes.  05/12/2014   ASSESSMENT AND PLAN  MURMUR:  I don't suspect carotid stenosis. This is probably radiation of her murmur. I suspect some aortic stenosis. There was no dynamic change so this is less likely to be hypertrophic cardiomyopathy. She doesn't really have any symptoms at this point. I will likely follow this clinically based on the results of the echo.  OVERWEIGHT:  We did discuss treatment for this. I gave her specific instructions about activity. She has lost several pounds by cutting back on her calories.  HTN:  This is borderline and I would like to threat this with weight loss and activity.

## 2014-06-02 ENCOUNTER — Ambulatory Visit (HOSPITAL_COMMUNITY): Payer: Medicare Other | Attending: Cardiology | Admitting: Radiology

## 2014-06-02 DIAGNOSIS — I079 Rheumatic tricuspid valve disease, unspecified: Secondary | ICD-10-CM | POA: Insufficient documentation

## 2014-06-02 DIAGNOSIS — E119 Type 2 diabetes mellitus without complications: Secondary | ICD-10-CM | POA: Insufficient documentation

## 2014-06-02 DIAGNOSIS — R011 Cardiac murmur, unspecified: Secondary | ICD-10-CM | POA: Insufficient documentation

## 2014-06-02 DIAGNOSIS — I10 Essential (primary) hypertension: Secondary | ICD-10-CM | POA: Insufficient documentation

## 2014-06-02 DIAGNOSIS — E669 Obesity, unspecified: Secondary | ICD-10-CM | POA: Insufficient documentation

## 2014-06-02 DIAGNOSIS — I517 Cardiomegaly: Secondary | ICD-10-CM | POA: Insufficient documentation

## 2014-06-02 NOTE — Progress Notes (Signed)
Echocardiogram performed.  

## 2014-06-09 ENCOUNTER — Telehealth: Payer: Self-pay | Admitting: Cardiology

## 2014-06-09 NOTE — Telephone Encounter (Signed)
I will see her in [place].

## 2014-06-09 NOTE — Telephone Encounter (Signed)
This pt. Saw you yesterday in [place] and was told you wanted to see her back in 4-6 weeks, but the scheduler told her you don't have anything over there until October. Do you want have her wait that long or do you want to see her in the [place] office sooner than October, I talked to the pt. And she stated she was willing to make the travel over here

## 2014-06-09 NOTE — Telephone Encounter (Signed)
Please make this pt. An appt.  To see Dr. Percival Spanish here in the [place] office sometime in the next 4 to 6 weeks; she normally sees him in [place] but he didn't have anything until OCT.

## 2014-06-09 NOTE — Telephone Encounter (Signed)
New message         Pt returning call about f/u visit for echo in [place]

## 2014-06-11 ENCOUNTER — Telehealth: Payer: Self-pay | Admitting: Cardiology

## 2014-06-11 NOTE — Telephone Encounter (Signed)
Closed encounter °

## 2014-07-06 ENCOUNTER — Telehealth: Payer: Self-pay | Admitting: Nurse Practitioner

## 2014-07-06 NOTE — Telephone Encounter (Signed)
Pt sees Dr Chalmers Cater Endocrinologist for diabetes

## 2014-07-14 ENCOUNTER — Encounter: Payer: Self-pay | Admitting: *Deleted

## 2014-07-29 ENCOUNTER — Telehealth: Payer: Self-pay | Admitting: Nurse Practitioner

## 2014-07-29 NOTE — Telephone Encounter (Signed)
Please schedule for dexa

## 2014-07-30 ENCOUNTER — Ambulatory Visit (INDEPENDENT_AMBULATORY_CARE_PROVIDER_SITE_OTHER): Payer: Medicare Other | Admitting: Cardiology

## 2014-07-30 VITALS — BP 137/66 | HR 61 | Ht 64.0 in | Wt 207.1 lb

## 2014-07-30 DIAGNOSIS — R011 Cardiac murmur, unspecified: Secondary | ICD-10-CM

## 2014-07-30 NOTE — Progress Notes (Signed)
HPI Toni Cooper presented for evaluation of possible carotid bruit in June 2015. Was noted to have a heart murmur at that time. She said she had carotid Dopplers three or four years ago that demonstrated no disease.  She does not report any echocardiogram. She's never been told she had any coronary disease and doesn't report that she's ever had a stress test.   She presents today for follow up. ECHO 7/15 showed EF 60-65 with grade 1 diastolic dysfunction and severe AS (possibly bicuspid valve) with mildly dialted left atrium. Denies any new symptoms such as chest discomfort, neck or arm discomfort. There has been no new shortness of breath, PND or orthopnea. There have been no reported palpitations, presyncope or syncope. Does not lay flat on her back for MSK problems. Otherwise feels like her normal self. Does not take BP at home. Weight is unchanged from previous. Does a lot of gardening and housework. Does not walk on a regular basis.   ECHO 06/02/14 Aortic valve- VTI ratio of LVOT to aortic valve: 0.21. Valve area (VTI): 0.67 cm^2. Indexed valve area (VTI): 0.32 cm^2/m^2. Valve area (Vmax): 0.72 cm^2. Indexed valve area (Vmax): 0.35 cm^2/m^2. Mean gradient (S): 51 mm Hg. Peak gradient (S): 82 mm Hg.   Allergies  Allergen Reactions  . Sulfa Antibiotics     Current Outpatient Prescriptions  Medication Sig Dispense Refill  . alendronate (FOSAMAX) 70 MG tablet Take 1 tablet (70 mg total) by mouth every 7 (seven) days. Take with a full glass of water on an empty stomach.  12 tablet  3  . aspirin 325 MG EC tablet Take 325 mg by mouth daily.      [name] atorvastatin (LIPITOR) 10 MG tablet 10 mg. Take one half pill daily      . beta carotene w/minerals (OCUVITE) tablet Take 1 tablet by mouth daily.      . calcium carbonate (OS-CAL) 600 MG TABS tablet Take 600 mg by mouth 2 (two) times daily with a meal.      . cholecalciferol (VITAMIN D) 1000 UNITS tablet Take 1,000 Units by mouth daily.      [name]  diltiazem (CARDIZEM CD) 120 MG 24 hr capsule Take 120 mg by mouth daily.       [name] HUMALOG 100 UNIT/ML injection Using sliding scale.      . hydrochlorothiazide (HYDRODIURIL) 25 MG tablet Take 25 mg by mouth daily.       [name] LANTUS 100 UNIT/ML injection Inject 35 Units into the skin at bedtime.       [name] lisinopril (PRINIVIL,ZESTRIL) 40 MG tablet Take 40 mg by mouth daily.       . Magnesium 250 MG TABS Take 1 tablet by mouth daily.      . metFORMIN (GLUCOPHAGE-XR) 500 MG 24 hr tablet Take 500 mg by mouth 2 (two) times daily.       . Multiple Vitamin (MULTIVITAMIN) capsule Take 1 capsule by mouth daily.      . Omega 3 1000 MG CAPS Take 1,000 mg by mouth daily.      [name] pyridOXINE (VITAMIN B-6) 100 MG tablet Take 100 mg by mouth daily.      [name] TEST test strip Test four times daily.      . vitamin C (ASCORBIC ACID) 500 MG tablet Take 500 mg by mouth daily.       No current facility-administered medications for this visit.    Past Medical History  Diagnosis Date  .  Hypertension   . Diabetes mellitus without complication     Past Surgical History  Procedure Laterality Date  . Abdominal hysterectomy    . Appendectomy    . Fracture surgery      PHYSICAL EXAM BP 137/66  Pulse 61  Ht 5\' 4"  (1.626 m)  Wt 207 lb 1.6 oz (93.94 kg)  BMI 35.53 kg/m2 GENERAL:  Well appearing HEENT:  Pupils equal round and reactive, fundi not visualized, oral mucosa unremarkable NECK:  No JVD, carotid upstroke brisk and symmetric, no bruits, no thyromegaly LYMPHATICS:  No cervical, inguinal adenopathy LUNGS:  Clear to auscultation bilaterally BACK:  No CVA tenderness CHEST:  Unremarkable HEART:  PMI not displaced or sustained,S1 and S2 within normal limits, no S3, no S4, no clicks, no rubs, apical systolic murmur 3/6 cres-desc radiating to the carotids bilat (not worsened with valsalva)  ABD:  Flat, positive bowel sounds normal in frequency in pitch, no bruits, no rebound, no guarding, no midline  pulsatile mass, no hepatomegaly, no splenomegaly EXT:  2 plus pulses throughout, no edema, no cyanosis no clubbing SKIN:  No rashes no nodules NEURO:  Cranial nerves II through XII grossly intact, motor grossly intact throughout PSYCH:  Cognitively intact, oriented to person place and time  EKG:  Sinus rhythm, rate 65, axis within normal limits, intervals within normal limits, no acute ST-T wave changes.  (04/2014)   ASSESSMENT AND PLAN  Severe AS: Largely symptomatic at this time no associated HF; will follow with serial ECHOs; eventually may need to consider valve replacement      Obesity:  We did discuss treatment for this at last visit; Weight is up 5 lbs since that time. Recommend again cutting back on calories and increasing physical activity .Needs to attempt to walk (with ultimate goal of 30 min 5 days a week)   HTN: Systolic 315Q (does not monitor at home). Currently on lisinopril and hctz. Would be aggressive with BP control. Currently as goal.  Diabetes: Last A1C 6.9 Average 117. Managed by Endo.   Follow up in 6 months for repeat ECHO  History and all data above reviewed.  Patient examined.  I agree with the findings as above.   She has no new SOB.  She denies all symptoms.  The patient exam reveals COR:RRR, systolic murmur unchanged  ,  Lungs: Clear  ,  Abd: Positive bowel sounds, no rebound no guarding, Ext No edema   .  All available labs, radiology testing, previous records reviewed. Agree with documented assessment and plan. AS:  She has a significant gradient on his initial echo but she denies any symptoms. We had a long discussion about her AS. At this point I would suggest repeating an echo in 6 months. Indeed she does have a gradient that measures with a mean of of 50 I would likely put her on a treadmill to make sure she is is asymptomatic as she think she is. We gave up about her eventual need for valve replacement.  Jeneen Rinks Hochrein  1:14 PM  07/30/2014

## 2014-07-30 NOTE — Patient Instructions (Signed)
Your physician wants you to follow-up in: 6 months You will receive a reminder letter in the mail two months in advance. If you don't receive a letter, please call our office to schedule the follow-up appointment.  We have ordered an Echo

## 2014-08-03 LAB — HEMOGLOBIN A1C: Hgb A1c MFr Bld: 6.3 % — AB (ref 4.0–6.0)

## 2014-08-04 LAB — HM DIABETES EYE EXAM

## 2014-08-04 LAB — FECAL OCCULT BLOOD, GUAIAC: Fecal Occult Blood: NEGATIVE

## 2014-08-30 ENCOUNTER — Encounter: Payer: Self-pay | Admitting: *Deleted

## 2014-09-13 ENCOUNTER — Ambulatory Visit (INDEPENDENT_AMBULATORY_CARE_PROVIDER_SITE_OTHER): Payer: PRIVATE HEALTH INSURANCE | Admitting: Nurse Practitioner

## 2014-09-13 ENCOUNTER — Encounter: Payer: Self-pay | Admitting: Nurse Practitioner

## 2014-09-13 VITALS — BP 147/67 | HR 74 | Temp 97.8°F | Ht 64.0 in | Wt 208.6 lb

## 2014-09-13 DIAGNOSIS — I10 Essential (primary) hypertension: Secondary | ICD-10-CM

## 2014-09-13 DIAGNOSIS — M810 Age-related osteoporosis without current pathological fracture: Secondary | ICD-10-CM

## 2014-09-13 DIAGNOSIS — R011 Cardiac murmur, unspecified: Secondary | ICD-10-CM

## 2014-09-13 DIAGNOSIS — Z23 Encounter for immunization: Secondary | ICD-10-CM

## 2014-09-13 DIAGNOSIS — E785 Hyperlipidemia, unspecified: Secondary | ICD-10-CM

## 2014-09-13 DIAGNOSIS — E119 Type 2 diabetes mellitus without complications: Secondary | ICD-10-CM

## 2014-09-13 NOTE — Patient Instructions (Signed)

## 2014-09-13 NOTE — Progress Notes (Signed)
Subjective:    Patient ID: Toni Cooper, female    DOB: Nov 06, 1946, 68 y.o.   MRN: [id number]  Patient here today for follow up of chronic medical problems.   Hypertension This is a chronic problem. The current episode started more than 1 year ago. The problem is unchanged. The problem is controlled. Pertinent negatives include no blurred vision, chest pain, headaches, neck pain, palpitations, peripheral edema or shortness of breath. There are no associated agents to hypertension. Risk factors for coronary artery disease include diabetes mellitus, dyslipidemia, family history, obesity and post-menopausal state. Past treatments include ACE inhibitors and diuretics. The current treatment provides no improvement. There are no compliance problems.   Hyperlipidemia This is a chronic problem. The current episode started more than 1 year ago. The problem is controlled. Recent lipid tests were reviewed and are normal. Exacerbating diseases include diabetes and obesity. She has no history of hypothyroidism. There are no known factors aggravating her hyperlipidemia. Pertinent negatives include no chest pain or shortness of breath. Current antihyperlipidemic treatment includes statins. The current treatment provides moderate improvement of lipids. Compliance problems include adherence to diet and adherence to exercise.  Risk factors for coronary artery disease include diabetes mellitus, family history, hypertension, obesity and post-menopausal.  Diabetes She presents for her follow-up diabetic visit. She has type 2 diabetes mellitus. No MedicAlert identification noted. Her disease course has been fluctuating. Pertinent negatives for hypoglycemia include no headaches. Pertinent negatives for diabetes include no blurred vision and no chest pain. Symptoms are improving. Risk factors for coronary artery disease include dyslipidemia, family history, hypertension, obesity and post-menopausal. Current diabetic treatment  includes oral agent (monotherapy). Her weight is stable. She is following a diabetic diet. When asked about meal planning, she reported none. She has not had a previous visit with a dietician. She rarely participates in exercise. There is no change in her home blood glucose trend. Her breakfast blood glucose is taken between 8-9 am. Her breakfast blood glucose range is generally 110-130 mg/dl. Her lunch blood glucose range is generally 130-140 mg/dl. Her dinner blood glucose range is generally 130-140 mg/dl. Her highest blood glucose is >200 mg/dl. Her overall blood glucose range is 130-140 mg/dl. She does not see a podiatrist.Eye exam is current.  Osteoporosis Doing well on fosamax   Review of Systems  Eyes: Negative for blurred vision.  Respiratory: Negative for shortness of breath.   Cardiovascular: Negative for chest pain and palpitations.  Musculoskeletal: Negative for neck pain.  Neurological: Negative for headaches.       Objective:   Physical Exam  Constitutional: She is oriented to person, place, and time. She appears well-developed and well-nourished.  HENT:  Nose: Nose normal.  Mouth/Throat: Oropharynx is clear and moist.  Eyes: EOM are normal.  Neck: Trachea normal, normal range of motion and full passive range of motion without pain. Neck supple. No JVD present. Carotid bruit is not present. No thyromegaly present.  bil 2/6 carotid bruits  Cardiovascular: Normal rate, regular rhythm and intact distal pulses.  Exam reveals no gallop and no friction rub.   Murmur (2/6 systolic) heard. Pulmonary/Chest: Effort normal and breath sounds normal.  Abdominal: Soft. Bowel sounds are normal. She exhibits no distension and no mass. There is no tenderness.  Musculoskeletal: Normal range of motion.  Lymphadenopathy:    She has no cervical adenopathy.  Neurological: She is alert and oriented to person, place, and time. She has normal reflexes.  Skin: Skin is warm and dry.  Psychiatric:  She has a normal mood and affect. Her behavior is normal. Judgment and thought content normal.     BP 147/67  Pulse 74  Temp(Src) 97.8 F (36.6 C) (Oral)  Ht 5\' 4"  (1.626 m)  Wt 208 lb 9.6 oz (94.62 kg)  BMI 35.79 kg/m2  Results for orders placed in visit on 07/29/14  HM DIABETES EYE EXAM      Result Value Ref Range   HM Diabetic Eye Exam No Retinopathy  No Retinopathy         Assessment & Plan:   1. Type 2 diabetes mellitus without complication Count carbs  2. Hyperlipidemia Low fat diet  3. Essential hypertension Low NA diet  4. Osteoporosis Keep appointment for dexa scan  5. Murmur Keep follow up with Dr. Warren Lacy    Labs pending Health maintenance reviewed Diet and exercise encouraged Continue all meds Follow up  In 3 month   [place], Ava

## 2014-09-15 ENCOUNTER — Encounter: Payer: Self-pay | Admitting: Pharmacist

## 2014-09-15 ENCOUNTER — Ambulatory Visit (INDEPENDENT_AMBULATORY_CARE_PROVIDER_SITE_OTHER): Payer: PRIVATE HEALTH INSURANCE

## 2014-09-15 ENCOUNTER — Ambulatory Visit (INDEPENDENT_AMBULATORY_CARE_PROVIDER_SITE_OTHER): Payer: PRIVATE HEALTH INSURANCE | Admitting: Pharmacist

## 2014-09-15 VITALS — Ht 64.0 in | Wt 208.0 lb

## 2014-09-15 DIAGNOSIS — M810 Age-related osteoporosis without current pathological fracture: Secondary | ICD-10-CM

## 2014-09-15 LAB — HM DEXA SCAN

## 2014-09-15 NOTE — Progress Notes (Signed)
Patient ID: Toni Cooper, female   DOB: 08/16/1946, 68 y.o.   MRN: [id number] Subjective:    Toni Cooper is a 68 y.o. female who presents for Medicare Initial Wellness Visit and Review DEXA.  However upon further questioning patient reports that in September 2015 a nurse from [employer] visited her in her home and believes that she performed an annual wellness visit.  Preventive Screening-Counseling & Management  Tobacco History  Smoking status  . Never Smoker   Smokeless tobacco  . Not on file     Current Problems (verified) Patient Active Problem List   Diagnosis Date Noted  . Murmur 05/12/2014  . Hypertension 09/07/2013  . Hyperlipidemia 09/07/2013  . Diabetes 09/07/2013  . Osteoporosis 09/07/2013    Medications Prior to Visit Current Outpatient Prescriptions on File Prior to Visit  Medication Sig Dispense Refill  . alendronate (FOSAMAX) 70 MG tablet Take 1 tablet (70 mg total) by mouth every 7 (seven) days. Take with a full glass of water on an empty stomach.  12 tablet  3  . aspirin 325 MG EC tablet Take 325 mg by mouth daily.      [name] atorvastatin (LIPITOR) 10 MG tablet Take 5 mg by mouth daily at 6 PM. Take one half pill daily      . beta carotene w/minerals (OCUVITE) tablet Take 1 tablet by mouth daily.      . calcium carbonate (OS-CAL) 600 MG TABS tablet Take 600 mg by mouth 2 (two) times daily with a meal.      . cholecalciferol (VITAMIN D) 1000 UNITS tablet Take 1,000 Units by mouth daily.      [name] diltiazem (CARDIZEM CD) 120 MG 24 hr capsule Take 120 mg by mouth daily.       [name] LANTUS 100 UNIT/ML injection Inject 35 Units into the skin at bedtime.       [name] lisinopril (PRINIVIL,ZESTRIL) 40 MG tablet Take 40 mg by mouth daily.       . Magnesium 250 MG TABS Take 1 tablet by mouth daily.      . metFORMIN (GLUCOPHAGE-XR) 500 MG 24 hr tablet Take 500 mg by mouth 2 (two) times daily.       . Multiple Vitamin (MULTIVITAMIN) capsule Take 1 capsule by mouth daily.      . Omega 3  1000 MG CAPS Take 1,000 mg by mouth daily.      [name] pyridOXINE (VITAMIN B-6) 100 MG tablet Take 100 mg by mouth daily.      [name] TEST test strip Test four times daily.      . vitamin C (ASCORBIC ACID) 500 MG tablet Take 500 mg by mouth daily.      [name] HUMALOG 100 UNIT/ML injection 3 (three) times daily with meals. Using sliding scale.      . hydrochlorothiazide (HYDRODIURIL) 25 MG tablet Take 25 mg by mouth daily.        No current facility-administered medications on file prior to visit.    Current Medications (verified) Current Outpatient Prescriptions  Medication Sig Dispense Refill  . alendronate (FOSAMAX) 70 MG tablet Take 1 tablet (70 mg total) by mouth every 7 (seven) days. Take with a full glass of water on an empty stomach.  12 tablet  3  . aspirin 325 MG EC tablet Take 325 mg by mouth daily.      [name] atorvastatin (LIPITOR) 10 MG tablet Take 5 mg by mouth daily at 6 PM. Take one half  pill daily      . beta carotene w/minerals (OCUVITE) tablet Take 1 tablet by mouth daily.      . calcium carbonate (OS-CAL) 600 MG TABS tablet Take 600 mg by mouth 2 (two) times daily with a meal.      . cholecalciferol (VITAMIN D) 1000 UNITS tablet Take 1,000 Units by mouth daily.      [name] diltiazem (CARDIZEM CD) 120 MG 24 hr capsule Take 120 mg by mouth daily.       [name] LANTUS 100 UNIT/ML injection Inject 35 Units into the skin at bedtime.       [name] lisinopril (PRINIVIL,ZESTRIL) 40 MG tablet Take 40 mg by mouth daily.       . Magnesium 250 MG TABS Take 1 tablet by mouth daily.      . metFORMIN (GLUCOPHAGE-XR) 500 MG 24 hr tablet Take 500 mg by mouth 2 (two) times daily.       . Multiple Vitamin (MULTIVITAMIN) capsule Take 1 capsule by mouth daily.      . Omega 3 1000 MG CAPS Take 1,000 mg by mouth daily.      [name] pyridOXINE (VITAMIN B-6) 100 MG tablet Take 100 mg by mouth daily.      [name] TEST test strip Test four times daily.      . vitamin C (ASCORBIC ACID) 500 MG tablet Take 500 mg by mouth  daily.      [name] HUMALOG 100 UNIT/ML injection 3 (three) times daily with meals. Using sliding scale.      . hydrochlorothiazide (HYDRODIURIL) 25 MG tablet Take 25 mg by mouth daily.        No current facility-administered medications for this visit.     Allergies (verified) Sulfa antibiotics   PAST HISTORY  Family History Family History  Problem Relation Age of Onset  . Stroke Father 81  . Diabetes Father   . Retinopathy of prematurity Father   . Heart failure Mother   . Diabetes Paternal Aunt   . Diabetes Paternal Uncle   . Heart attack Maternal Grandmother   . Heart attack Paternal Grandmother     Social History History  Substance Use Topics  . Smoking status: Never Smoker   . Smokeless tobacco: Not on file  . Alcohol Use: No      HPI: Patient with osteoporosis currently taking alendronate 70mg  1 tablet weekly.  She has taken alendronate since 03/2010.  Tried Forteo but was unable to tolerate - caused bone pain.  She has a history of shoulder fracture, wrist fracture 05/2010 and another wrist fracture 06/2012.  Back Pain?  Yes       Kyphosis?  No                                                              Calcium Assessment Calcium Intake  # of servings/day  Calcium mg  Milk (8 oz) 0.2  x  300  = 150mg   Yogurt (4 oz) 1 x  200 = 200mg   Cheese (1 oz) 1 x  200 = 200mg   Other Calcium sources Almonds, oatmeal, broccoli  300mg   Ca supplement Calcium supplement 600mg  BID and MVI = 1600mg    Estimated calcium intake per day 2450mg       Objective:  Body  mass index is 35.69 kg/(m^2). Ht 5\' 4"  (1.626 m)  Wt 208 lb (94.348 kg)  BMI 35.69 kg/m2    DEXA Results Date of Test T-Score for AP Spine L1-L4 T-Score for Total Left Hip T-Score for Total Right Hip  09/15/2014 -2.9 -0.9 -1.2  08/20/2012 -2.1 -0.8 -1.3  02/22/2010 -3.1 -1.1 -1.9          Assessment:   Assessment: Osteoporosis - decreased in BMD over last 2 years despite treatment with alendronate.  Also has  been on alendroante approaching 5 years.       Plan:     Recommendations: 1.  Continue alendronate (FOSAMAX) 70mg  1 tablet weekly for now. Will consult with patient's endocrinologist about possible treatment alternatives.   2.  recommend calcium 1200mg  daily through supplementation or diet. Since patient is taking MVI and gets calcium from dietary sources recommended she stop calcium supplement but continue MVI and dietary calcium 3.  recommend weight bearing exercise - 30 minutes at least 4 days per week.   4.  Counseled and educated about fall risk and prevention.  Recheck DEXA:  2 years  Time spent counseling patient:  40 minutes  Cherre Robins, PharmD, CPP

## 2014-09-15 NOTE — Patient Instructions (Signed)
Fall Prevention and Home Safety Falls cause injuries and can affect all age groups. It is possible to use preventive measures to significantly decrease the likelihood of falls. There are many simple measures which can make your home safer and prevent falls. OUTDOORS  Repair cracks and edges of walkways and driveways.  Remove high doorway thresholds.  Trim shrubbery on the main path into your home.  Have good outside lighting.  Clear walkways of tools, rocks, debris, and clutter.  Check that handrails are not broken and are securely fastened. Both sides of steps should have handrails.  Have leaves, snow, and ice cleared regularly.  Use sand or salt on walkways during winter months.  In the garage, clean up grease or oil spills. BATHROOM  Install night lights.  Install grab bars by the toilet and in the tub and shower.  Use non-skid mats or decals in the tub or shower.  Place a plastic non-slip stool in the shower to sit on, if needed.  Keep floors dry and clean up all water on the floor immediately.  Remove soap buildup in the tub or shower on a regular basis.  Secure bath mats with non-slip, double-sided rug tape.  Remove throw rugs and tripping hazards from the floors. BEDROOMS  Install night lights.  Make sure a bedside light is easy to reach.  Do not use oversized bedding.  Keep a telephone by your bedside.  Have a firm chair with side arms to use for getting dressed.  Remove throw rugs and tripping hazards from the floor. KITCHEN  Keep handles on pots and pans turned toward the center of the stove. Use back burners when possible.  Clean up spills quickly and allow time for drying.  Avoid walking on wet floors.  Avoid hot utensils and knives.  Position shelves so they are not too high or low.  Place commonly used objects within easy reach.  If necessary, use a sturdy step stool with a grab bar when reaching.  Keep electrical cables out of the  way.  Do not use floor polish or wax that makes floors slippery. If you must use wax, use non-skid floor wax.  Remove throw rugs and tripping hazards from the floor. STAIRWAYS  Never leave objects on stairs.  Place handrails on both sides of stairways and use them. Fix any loose handrails. Make sure handrails on both sides of the stairways are as long as the stairs.  Check carpeting to make sure it is firmly attached along stairs. Make repairs to worn or loose carpet promptly.  Avoid placing throw rugs at the top or bottom of stairways, or properly secure the rug with carpet tape to prevent slippage. Get rid of throw rugs, if possible.  Have an electrician put in a light switch at the top and bottom of the stairs. OTHER FALL PREVENTION TIPS  Wear low-heel or rubber-soled shoes that are supportive and fit well. Wear closed toe shoes.  When using a stepladder, make sure it is fully opened and both spreaders are firmly locked. Do not climb a closed stepladder.  Add color or contrast paint or tape to grab bars and handrails in your home. Place contrasting color strips on first and last steps.  Learn and use mobility aids as needed. Install an electrical emergency response system.  Turn on lights to avoid dark areas. Replace light bulbs that burn out immediately. Get light switches that glow.  Arrange furniture to create clear pathways. Keep furniture in the same place.    Firmly attach carpet with non-skid or double-sided tape.  Eliminate uneven floor surfaces.  Select a carpet pattern that does not visually hide the edge of steps.  Be aware of all pets. OTHER HOME SAFETY TIPS  Set the water temperature for 120 F (48.8 C).  Keep emergency numbers on or near the telephone.  Keep smoke detectors on every level of the home and near sleeping areas. Document Released: 10/26/2002 Document Revised: 05/06/2012 Document Reviewed: 01/25/2012 ExitCare Patient Information 2015  [employer]. This information is not intended to replace advice given to you by your health care provider. Make sure you discuss any questions you have with your health care provider.                Exercise for Strong Bones  Exercise is important to build and maintain strong bones / bone density.  There are 2 types of exercises that are important to building and maintaining strong bones:  Weight- bearing and muscle-stregthening.  Weight-bearing Exercises  These exercises include activities that make you move against gravity while staying upright. Weight-bearing exercises can be high-impact or low-impact.  High-impact weight-bearing exercises help build bones and keep them strong. If you have broken a bone due to osteoporosis or are at risk of breaking a bone, you may need to avoid high-impact exercises. If you're not sure, you should check with your healthcare provider.  Examples of high-impact weight-bearing exercises are: Dancing  Doing high-impact aerobics  Hiking  Jogging/running  Jumping Rope  Stair climbing  Tennis  Low-impact weight-bearing exercises can also help keep bones strong and are a safe alternative if you cannot do high-impact exercises.   Examples of low-impact weight-bearing exercises are: Using elliptical training machines  Doing low-impact aerobics  Using stair-step machines  Fast walking on a treadmill or outside   Muscle-Strengthening Exercises These exercises include activities where you move your body, a weight or some other resistance against gravity. They are also known as resistance exercises and include: Lifting weights  Using elastic exercise bands  Using weight machines  Lifting your own body weight  Functional movements, such as standing and rising up on your toes  Yoga and Pilates can also improve strength, balance and flexibility. However, certain positions may not be safe for people with osteoporosis or those at increased risk of broken  bones. For example, exercises that have you bend forward may increase the chance of breaking a bone in the spine.   Non-Impact Exercises There are other types of exercises that can help prevent falls.  Non-impact exercises can help you to improve balance, posture and how well you move in everyday activities. Some of these exercises include: Balance exercises that strengthen your legs and test your balance, such as Tai Chi, can decrease your risk of falls.  Posture exercises that improve your posture and reduce rounded or "sloping" shoulders can help you decrease the chance of breaking a bone, especially in the spine.  Functional exercises that improve how well you move can help you with everyday activities and decrease your chance of falling and breaking a bone. For example, if you have trouble getting up from a chair or climbing stairs, you should do these activities as exercises.   **A physical therapist can teach you balance, posture and functional exercises. He/she can also help you learn which exercises are safe and appropriate for you.  [facility] has a physical therapy office in [place] in front of our office and referrals can be made for assessments   and treatment as needed and strength and balance training.  If you would like to have an assessment with [facility] and our physical therapy team please let a nurse or provider know.    

## 2014-10-15 ENCOUNTER — Ambulatory Visit (INDEPENDENT_AMBULATORY_CARE_PROVIDER_SITE_OTHER): Payer: PRIVATE HEALTH INSURANCE

## 2014-10-15 DIAGNOSIS — Z23 Encounter for immunization: Secondary | ICD-10-CM

## 2015-01-04 ENCOUNTER — Inpatient Hospital Stay (HOSPITAL_COMMUNITY): Admission: RE | Admit: 2015-01-04 | Payer: Medicare Other | Source: Ambulatory Visit

## 2015-01-06 ENCOUNTER — Telehealth (HOSPITAL_COMMUNITY): Payer: Self-pay | Admitting: *Deleted

## 2015-01-10 ENCOUNTER — Ambulatory Visit (HOSPITAL_COMMUNITY)
Admission: RE | Admit: 2015-01-10 | Discharge: 2015-01-10 | Disposition: A | Discharge status: Home / Self Care | Payer: Medicare Other | Source: Ambulatory Visit | Attending: Cardiology | Admitting: Cardiology

## 2015-01-10 DIAGNOSIS — R011 Cardiac murmur, unspecified: Secondary | ICD-10-CM | POA: Insufficient documentation

## 2015-01-10 NOTE — Progress Notes (Signed)
2D Echo Performed 01/10/2015    Marygrace Drought, RCS

## 2015-01-27 ENCOUNTER — Telehealth: Payer: Self-pay | Admitting: Cardiology

## 2015-01-27 NOTE — Telephone Encounter (Signed)
Pt would like echo results from 01-10-15 please.

## 2015-01-27 NOTE — Telephone Encounter (Signed)
Informed pt I don't see result note on this yet, would defer to [place].

## 2015-01-28 NOTE — Telephone Encounter (Signed)
Pt. Called and informed of her results of her echo

## 2015-02-01 ENCOUNTER — Telehealth: Payer: Self-pay | Admitting: Cardiology

## 2015-02-01 NOTE — Telephone Encounter (Signed)
Closed encounter °

## 2015-02-14 ENCOUNTER — Encounter: Payer: Self-pay | Admitting: Cardiology

## 2015-02-14 ENCOUNTER — Ambulatory Visit (INDEPENDENT_AMBULATORY_CARE_PROVIDER_SITE_OTHER): Payer: Medicare Other | Admitting: Cardiology

## 2015-02-14 VITALS — BP 140/70 | HR 66 | Ht 64.0 in | Wt 214.7 lb

## 2015-02-14 DIAGNOSIS — I10 Essential (primary) hypertension: Secondary | ICD-10-CM

## 2015-02-14 NOTE — Progress Notes (Signed)
HPI The patient presents for evaluation of heart murmur. At the last appt she was found to have severe AS on echo. I repeated his echocardiogram from September to February of this year and her mean gradient though elevated it is unchanged. She continues to deny any new symptoms. She goes up and down stairs in her home routinely and she has no new dyspnea. She was just doing yard work without shortness of breath chest discomfort or presyncope. She has no PND or orthopnea. She has no palpitations.  Allergies  Allergen Reactions  . Sulfa Antibiotics     Current Outpatient Prescriptions  Medication Sig Dispense Refill  . alendronate (FOSAMAX) 70 MG tablet Take 1 tablet (70 mg total) by mouth every 7 (seven) days. Take with a full glass of water on an empty stomach. 12 tablet 3  . aspirin 325 MG EC tablet Take 325 mg by mouth daily.    [name] atorvastatin (LIPITOR) 10 MG tablet Take 5 mg by mouth daily at 6 PM. Take one half pill daily    . beta carotene w/minerals (OCUVITE) tablet Take 1 tablet by mouth daily.    . calcium carbonate (OS-CAL) 600 MG TABS tablet Take 600 mg by mouth daily with breakfast.     . cholecalciferol (VITAMIN D) 1000 UNITS tablet Take 1,000 Units by mouth daily.    [name] diltiazem (CARDIZEM CD) 120 MG 24 hr capsule Take 120 mg by mouth daily.     [name] HUMALOG 100 UNIT/ML injection 3 (three) times daily with meals. Using sliding scale.    . hydrochlorothiazide (HYDRODIURIL) 25 MG tablet Take 25 mg by mouth daily.     [name] LANTUS 100 UNIT/ML injection Inject 35 Units into the skin at bedtime.     [name] lisinopril (PRINIVIL,ZESTRIL) 40 MG tablet Take 40 mg by mouth daily.     . Magnesium 250 MG TABS Take 1 tablet by mouth daily.    . metFORMIN (GLUCOPHAGE-XR) 500 MG 24 hr tablet Take 500 mg by mouth 2 (two) times daily.     . Multiple Vitamin (MULTIVITAMIN) capsule Take 1 capsule by mouth daily.    . Omega 3 1000 MG CAPS Take 1,000 mg by mouth daily.    [name] pyridOXINE (VITAMIN B-6) 100 MG  tablet Take 100 mg by mouth daily.    [name] TEST test strip Test four times daily.    . vitamin C (ASCORBIC ACID) 500 MG tablet Take 500 mg by mouth daily.     No current facility-administered medications for this visit.    Past Medical History  Diagnosis Date  . Hypertension   . Diabetes mellitus without complication   . Aortic stenosis, severe 2015  . Osteoporosis   . Cataract     Past Surgical History  Procedure Laterality Date  . Abdominal hysterectomy    . Appendectomy    . Fracture surgery      shoulder   ROS:  As  stated in the HPI and negative for all other systems.  PHYSICAL EXAM BP 140/70 mmHg  Pulse 66  Ht 5\' 4"  (1.626 m)  Wt 214 lb 11.2 oz (97.387 kg)  BMI 36.83 kg/m2 GENERAL:  Well appearing HEENT:  Pupils equal round and reactive, fundi not visualized, oral mucosa unremarkable NECK:  No jugular venous distention, waveform within normal limits, carotid upstroke brisk and symmetric, no bruits, no thyromegaly LYMPHATICS:  No cervical, inguinal adenopathy LUNGS:  Clear to auscultation bilaterally BACK:  No CVA tenderness CHEST:  Unremarkable HEART:  PMI not displaced or sustained,S1 and S2 within normal limits, no S3, no S4, no clicks, no rubs, apical systolic murmur 3/6, mid to late peaking, radiating out the aortic outflow tract and into the carotids, no change with Valsalva or position, no diastolicmurmurs ABD:  Flat, positive bowel sounds normal in frequency in pitch, no bruits, no rebound, no guarding, no midline pulsatile mass, no hepatomegaly, no splenomegaly EXT:  2 plus pulses throughout, no edema, no cyanosis no clubbing  EKG:  Sinus rhythm, rate 66, axis within normal limits, intervals within normal limits, no acute ST-T wave changes.  02/14/2015   ASSESSMENT AND PLAN  AORTIC STENOSIS:  I had a long discussion with her today again. She is asymptomatic with this and although she has severe stenosis with gradient has not changed. We will continue  to follow this clinically. I will see her back in 6 months and probably repeat echocardiography will be performed in 12 months.   OVERWEIGHT:  We did discuss treatment for this. I gave her specific instructions about activity.   HTN:  This is borderline and I would like to treat this with weight loss and activity.  She will continue meds as above.

## 2015-02-14 NOTE — Patient Instructions (Signed)
Your physician wants you to follow-up in: 6 MONTHS WITH DR. HOCHREIN. You will receive a reminder letter in the mail two months in advance. If you don't receive a letter, please call our office to schedule the follow-up appointment.  

## 2015-03-11 LAB — HEMOGLOBIN A1C
Hgb A1c MFr Bld: 6.9 % — AB (ref 4.0–6.0)
Hgb A1c MFr Bld: 7.2 % — AB (ref 4.0–6.0)

## 2015-03-16 ENCOUNTER — Encounter: Payer: Self-pay | Admitting: Nurse Practitioner

## 2015-03-16 ENCOUNTER — Ambulatory Visit (INDEPENDENT_AMBULATORY_CARE_PROVIDER_SITE_OTHER): Payer: 59 | Admitting: Nurse Practitioner

## 2015-03-16 VITALS — BP 122/88 | HR 73 | Temp 98.0°F | Ht 64.0 in | Wt 212.0 lb

## 2015-03-16 DIAGNOSIS — M810 Age-related osteoporosis without current pathological fracture: Secondary | ICD-10-CM

## 2015-03-16 DIAGNOSIS — Z0000 Encounter for general adult medical examination without abnormal findings: Secondary | ICD-10-CM

## 2015-03-16 DIAGNOSIS — E119 Type 2 diabetes mellitus without complications: Secondary | ICD-10-CM

## 2015-03-16 DIAGNOSIS — I10 Essential (primary) hypertension: Secondary | ICD-10-CM

## 2015-03-16 DIAGNOSIS — E785 Hyperlipidemia, unspecified: Secondary | ICD-10-CM

## 2015-03-16 MED ORDER — LISINOPRIL 40 MG PO TABS: 40.0000 mg | ORAL_TABLET | Freq: Every day | ORAL | Status: DC

## 2015-03-16 MED ORDER — ATORVASTATIN CALCIUM 10 MG PO TABS: 5.0000 mg | ORAL_TABLET | Freq: Every day | ORAL | Status: DC

## 2015-03-16 MED ORDER — HYDROCHLOROTHIAZIDE 25 MG PO TABS: 25.0000 mg | ORAL_TABLET | Freq: Every day | ORAL | Status: DC

## 2015-03-16 MED ORDER — ATORVASTATIN CALCIUM 10 MG PO TABS
5.0000 mg | ORAL_TABLET | Freq: Every day | ORAL | Status: DC
Start: 2015-03-16 — End: 2015-03-16

## 2015-03-16 MED ORDER — ALENDRONATE SODIUM 70 MG PO TABS: 70.0000 mg | ORAL_TABLET | ORAL | Status: DC

## 2015-03-16 MED ORDER — LISINOPRIL 40 MG PO TABS
40.0000 mg | ORAL_TABLET | Freq: Every day | ORAL | Status: DC
Start: 2015-03-16 — End: 2015-10-25

## 2015-03-16 MED ORDER — DILTIAZEM HCL ER COATED BEADS 120 MG PO CP24: 120.0000 mg | ORAL_CAPSULE | Freq: Every day | ORAL | Status: DC

## 2015-03-16 NOTE — Patient Instructions (Signed)
Bone Health Our bones do many things. They provide structure, protect organs, anchor muscles, and store calcium. Adequate calcium in your diet and weight-bearing physical activity help build strong bones, improve bone amounts, and may reduce the risk of weakening of bones (osteoporosis) later in life. PEAK BONE MASS By age 70, the average woman has acquired most of her skeletal bone mass. A large decline occurs in older adults which increases the risk of osteoporosis. In women this occurs around the time of menopause. It is important for young girls to reach their peak bone mass in order to maintain bone health throughout life. A person with high bone mass as a young adult will be more likely to have a higher bone mass later in life. Not enough calcium consumption and physical activity early on could result in a failure to achieve optimum bone mass in adulthood. OSTEOPOROSIS Osteoporosis is a disease of the bones. It is defined as low bone mass with deterioration of bone structure. Osteoporosis leads to an increase risk of fractures with falls. These fractures commonly happen in the wrist, hip, and spine. While men and women of all ages and background can develop osteoporosis, some of the risk factors for osteoporosis are:  Female.  White.  Postmenopausal.  Older adults.  Small in body size.  Eating a diet low in calcium.  Physically inactive.  Smoking.  Use of some medications.  Family history. CALCIUM Calcium is a mineral needed by the body for healthy bones, teeth, and proper function of the heart, muscles, and nerves. The body cannot produce calcium so it must be absorbed through food. Good sources of calcium include:  Dairy products (low fat or nonfat milk, cheese, and yogurt).  Dark green leafy vegetables (bok choy and broccoli).  Calcium fortified foods (orange juice, cereal, bread, soy beverages, and tofu products).  Nuts (almonds). Recommended amounts of calcium vary  for individuals. RECOMMENDED CALCIUM INTAKES Age and Amount in mg per day  Children 1 to 3 years / 700 mg  Children 4 to 8 years / 1,000 mg  Children 9 to 13 years / 1,300 mg  Teens 14 to 18 years / 1,300 mg  Adults 19 to 50 years / 1,000 mg  Adult women 51 to 70 years / 1,200 mg  Adults 71 years and older / 1,200 mg  Pregnant and breastfeeding teens / 1,300 mg  Pregnant and breastfeeding adults / 1,000 mg Vitamin D also plays an important role in healthy bone development. Vitamin D helps in the absorption of calcium. WEIGHT-BEARING PHYSICAL ACTIVITY Regular physical activity has many positive health benefits. Benefits include strong bones. Weight-bearing physical activity early in life is important in reaching peak bone mass. Weight-bearing physical activities cause muscles and bones to work against gravity. Some examples of weight bearing physical activities include:  Walking, jogging, or running.  Field Hockey.  Jumping rope.  Dancing.  Soccer.  Tennis or Racquetball.  Stair climbing.  Basketball.  Hiking.  Weight lifting.  Aerobic fitness classes. Including weight-bearing physical activity into an exercise plan is a great way to keep bones healthy. Adults: Engage in at least 30 minutes of moderate physical activity on most, preferably all, days of the week. Children: Engage in at least 60 minutes of moderate physical activity on most, preferably all, days of the week. FOR MORE INFORMATION [place] Department of Agriculture, Center for Nutrition Policy and Promotion: www.cnpp.usda.gov National Osteoporosis Foundation: www.nof.org Document Released: 01/26/2004 Document Revised: 03/02/2013 Document Reviewed: 04/27/2009 ExitCare Patient Information   2015 [employer]. This information is not intended to replace advice given to you by your health care provider. Make sure you discuss any questions you have with your health care provider.  

## 2015-03-16 NOTE — Progress Notes (Signed)
Subjective:    Patient ID: Toni Cooper, female    DOB: Nov 28, 1945, 69 y.o.   MRN: [id number]  Patient here today for annual physical exam and follow up of chronic medical problems. No complaints today. Had labs drawn in [place] and HGBA1c was 7.2.   Hypertension This is a chronic problem. The current episode started more than 1 year ago. The problem is unchanged. The problem is controlled. Pertinent negatives include no chest pain, headaches, neck pain, palpitations or shortness of breath. Risk factors for coronary artery disease include diabetes mellitus, dyslipidemia, obesity, post-menopausal state and sedentary lifestyle. Past treatments include ACE inhibitors and diuretics. The current treatment provides moderate improvement. Compliance problems include diet and exercise.  Hypertensive end-organ damage includes CAD/MI.  Hyperlipidemia This is a chronic problem. The current episode started more than 1 year ago. Recent lipid tests were reviewed and are variable. Exacerbating diseases include diabetes and obesity. She has no history of hypothyroidism. Pertinent negatives include no chest pain or shortness of breath. Current antihyperlipidemic treatment includes statins. The current treatment provides moderate improvement of lipids. Compliance problems include adherence to diet and adherence to exercise.  Risk factors for coronary artery disease include diabetes mellitus, dyslipidemia, hypertension, obesity and post-menopausal.  Diabetes She presents for her follow-up diabetic visit. She has type 2 diabetes mellitus. No MedicAlert identification noted. Her disease course has been stable. Pertinent negatives for hypoglycemia include no headaches. There are no diabetic associated symptoms. Pertinent negatives for diabetes include no chest pain. Her weight is stable. When asked about meal planning, she reported none. She has not had a previous visit with a dietitian. She rarely participates in exercise.  Her breakfast blood glucose is taken between 9-10 am. Her breakfast blood glucose range is generally 130-140 mg/dl. Her overall blood glucose range is 130-140 mg/dl. An ACE inhibitor/angiotensin II receptor blocker is being taken. She does not see a podiatrist.Eye exam is not current.  Osteoporosis Doing well on fosamax CAD Saw Dr. Warren Lacy 1 month ago- EKG unchanged and echo cardiogram was normal- on diltiazem without side effects.   Review of Systems  Constitutional: Negative.   HENT: Negative.   Respiratory: Negative for shortness of breath.   Cardiovascular: Negative for chest pain and palpitations.  Endocrine: Negative.   Genitourinary: Negative.   Musculoskeletal: Negative for neck pain.  Neurological: Negative for headaches.  Psychiatric/Behavioral: Negative.        Objective:   Physical Exam  Constitutional: She is oriented to person, place, and time. She appears well-developed and well-nourished.  HENT:  Head: Normocephalic.  Right Ear: Hearing, tympanic membrane, external ear and ear canal normal.  Left Ear: Hearing, tympanic membrane, external ear and ear canal normal.  Nose: Nose normal.  Mouth/Throat: Uvula is midline, oropharynx is clear and moist and mucous membranes are normal.  Eyes: Conjunctivae and EOM are normal. Pupils are equal, round, and reactive to light.  Neck: Trachea normal, normal range of motion and full passive range of motion without pain. Neck supple. No JVD present. Carotid bruit is not present. No thyromegaly present.  bil 2/6 carotid bruits  Cardiovascular: Normal rate, regular rhythm and intact distal pulses.  Exam reveals no gallop and no friction rub.   Murmur (2/6 systolic) heard. Pulmonary/Chest: Effort normal and breath sounds normal. She has no wheezes. She has no rales.   Right breast exhibits no inverted nipple, no mass, no nipple discharge, no skin change and no tenderness. Left breast exhibits no inverted nipple, no mass, no nipple  discharge, no skin change and no tenderness.  Abdominal: Soft. Bowel sounds are normal. She exhibits no distension and no mass. There is no tenderness.  Musculoskeletal: Normal range of motion.  Lymphadenopathy:    She has no cervical adenopathy.  Neurological: She is alert and oriented to person, place, and time. She has normal reflexes.  Skin: Skin is warm and dry.  Psychiatric: She has a normal mood and affect. Her behavior is normal. Judgment and thought content normal.    BP 122/88 mmHg  Pulse 73  Temp(Src) 98 F (36.7 C) (Oral)  Ht 5\' 4"  (1.626 m)  Wt 212 lb (96.163 kg)  BMI 36.37 kg/m2            Assessment & Plan:   1. Essential hypertension Do not add salt to diet - lisinopril (PRINIVIL,ZESTRIL) 40 MG tablet; Take 1 tablet (40 mg total) by mouth daily.  Dispense: 90 tablet; Refill: 1 - hydrochlorothiazide (HYDRODIURIL) 25 MG tablet; Take 1 tablet (25 mg total) by mouth daily.  Dispense: 90 tablet; Refill: 1 - diltiazem (CARDIZEM CD) 120 MG 24 hr capsule; Take 1 capsule (120 mg total) by mouth daily.  Dispense: 90 capsule; Refill: 1  2. Type 2 diabetes mellitus without complication *2continue carb counting Keep follow up appointmnet with Dr. Chalmers Cater  3. Osteoporosis Will discuss stopping fosamax with clinical pharmacist - alendronate (FOSAMAX) 70 MG tablet; Take 1 tablet (70 mg total) by mouth every 7 (seven) days. Take with a full glass of water on an empty stomach.  Dispense: 12 tablet; Refill: 3  4. Hyperlipidemia Low fta idte - atorvastatin (LIPITOR) 10 MG tablet; Take 0.5 tablets (5 mg total) by mouth daily at 6 PM. Take one half pill daily  Dispense: 90 tablet; Refill: 1  5. Annual physical exam    Labs pending Health maintenance reviewed Diet and exercise encouraged Continue all meds Follow up  In 3 months   [place], FNP

## 2015-04-13 ENCOUNTER — Encounter: Payer: Self-pay | Admitting: Family Medicine

## 2015-07-27 ENCOUNTER — Encounter: Payer: Self-pay | Admitting: Cardiology

## 2015-07-27 ENCOUNTER — Ambulatory Visit (INDEPENDENT_AMBULATORY_CARE_PROVIDER_SITE_OTHER): Payer: Medicare Other | Admitting: Cardiology

## 2015-07-27 VITALS — BP 106/62 | HR 73 | Ht 64.0 in | Wt 208.0 lb

## 2015-07-27 DIAGNOSIS — R011 Cardiac murmur, unspecified: Secondary | ICD-10-CM | POA: Diagnosis not present

## 2015-07-27 DIAGNOSIS — I10 Essential (primary) hypertension: Secondary | ICD-10-CM | POA: Diagnosis not present

## 2015-07-27 NOTE — Patient Instructions (Signed)
Medication Instructions:  Your physician recommends that you continue on your current medications as directed. Please refer to the Current Medication list given to you today.  Testing/Procedures: Your physician has requested that you have an echocardiogram. Echocardiography is a painless test that uses sound waves to create images of your heart. It provides your doctor with information about the size and shape of your heart and how well your heart's chambers and valves are working. This procedure takes approximately one hour. There are no restrictions for this procedure.  Follow-Up: Follow up in February 2017 with Dr Percival Spanish in [place].  Thank you for choosing [facility]!!

## 2015-07-27 NOTE — Progress Notes (Signed)
HPI The patient presents for follow up of severe AS on echo.  She continues to deny any new symptoms. She goes up and down 12 stairs in her home routinely and she has no new dyspnea. She was just doing yard work without shortness of breath chest discomfort or presyncope.  She walks on a treadmill and an elliptical. She has no PND or orthopnea. She has no palpitations.   Allergies  Allergen Reactions  . Sulfa Antibiotics     Current Outpatient Prescriptions  Medication Sig Dispense Refill  . aspirin 325 MG EC tablet Take 325 mg by mouth daily.    [name] atorvastatin (LIPITOR) 10 MG tablet Take 0.5 tablets (5 mg total) by mouth daily at 6 PM. Take one half pill daily 90 tablet 1  . beta carotene w/minerals (OCUVITE) tablet Take 1 tablet by mouth daily.    . calcium carbonate (OS-CAL) 600 MG TABS tablet Take 600 mg by mouth daily with breakfast.     . cholecalciferol (VITAMIN D) 1000 UNITS tablet Take 1,000 Units by mouth daily.    [name] diltiazem (CARDIZEM CD) 120 MG 24 hr capsule Take 1 capsule (120 mg total) by mouth daily. 90 capsule 1  . HUMALOG 100 UNIT/ML injection 3 (three) times daily with meals. Using sliding scale.    . hydrochlorothiazide (HYDRODIURIL) 25 MG tablet Take 1 tablet (25 mg total) by mouth daily. 90 tablet 1  . LANTUS 100 UNIT/ML injection Inject 35 Units into the skin at bedtime.     [name] lisinopril (PRINIVIL,ZESTRIL) 40 MG tablet Take 1 tablet (40 mg total) by mouth daily. 90 tablet 1  . Magnesium 250 MG TABS Take 1 tablet by mouth daily.    . metFORMIN (GLUCOPHAGE-XR) 500 MG 24 hr tablet Take 500 mg by mouth 2 (two) times daily.     . Multiple Vitamin (MULTIVITAMIN) capsule Take 1 capsule by mouth daily.    . Omega 3 1000 MG CAPS Take 1,000 mg by mouth daily.    [name] pyridOXINE (VITAMIN B-6) 100 MG tablet Take 100 mg by mouth daily.    [name] TEST test strip Test four times daily.    . vitamin C (ASCORBIC ACID) 500 MG tablet Take 500 mg by mouth daily.     No current  facility-administered medications for this visit.    Past Medical History  Diagnosis Date  . Hypertension   . Diabetes mellitus without complication   . Aortic stenosis, severe 2015  . Osteoporosis   . Cataract     Past Surgical History  Procedure Laterality Date  . Abdominal hysterectomy    . Appendectomy    . Fracture surgery      shoulder   ROS:  As  stated in the HPI and negative for all other systems.  PHYSICAL EXAM BP 106/62 mmHg  Pulse 73  Ht 5\' 4"  (1.626 m)  Wt 208 lb (94.348 kg)  BMI 35.69 kg/m2  SpO2 95% GENERAL:  Well appearing NECK:  No jugular venous distention, waveform within normal limits, carotid upstroke brisk and symmetric, no bruits, no thyromegaly LUNGS:  Clear to auscultation bilaterally HEART:  PMI not displaced or sustained,S1 and S2 within normal limits, no S3, no S4, no clicks, no rubs, apical systolic murmur 3/6, mid to late peaking, radiating out the aortic outflow tract and into the carotids, no change with Valsalva or position, no diastolicmurmurs ABD:  Flat, positive bowel sounds normal in frequency in pitch, no bruits, no rebound, no guarding,  no midline pulsatile mass, no hepatomegaly, no splenomegaly EXT:  2 plus pulses throughout, no edema, no cyanosis no clubbing  EKG:  Sinus rhythm, rate 70, axis within normal limits, intervals within normal limits, no acute ST-T wave changes.  07/27/2015   ASSESSMENT AND PLAN  AORTIC STENOSIS:  I had a long discussion with her today again. She is asymptomatic with this as above.  I will see her back in 6 months and repeat an echo at that time.   OVERWEIGHT: I gave her specific instructions about activity.   HTN:  The blood pressure is at target. No change in medications is indicated. We will continue with therapeutic lifestyle changes (TLC).

## 2015-08-10 ENCOUNTER — Telehealth: Payer: Self-pay | Admitting: Nurse Practitioner

## 2015-09-16 ENCOUNTER — Ambulatory Visit: Payer: 59 | Admitting: Nurse Practitioner

## 2015-09-29 ENCOUNTER — Encounter: Payer: Self-pay | Admitting: Nurse Practitioner

## 2015-09-29 ENCOUNTER — Ambulatory Visit (INDEPENDENT_AMBULATORY_CARE_PROVIDER_SITE_OTHER): Payer: Medicare Other | Admitting: Nurse Practitioner

## 2015-09-29 VITALS — BP 133/64 | HR 63 | Temp 97.5°F | Ht 64.0 in | Wt 208.0 lb

## 2015-09-29 DIAGNOSIS — I10 Essential (primary) hypertension: Secondary | ICD-10-CM

## 2015-09-29 DIAGNOSIS — E119 Type 2 diabetes mellitus without complications: Secondary | ICD-10-CM

## 2015-09-29 DIAGNOSIS — E785 Hyperlipidemia, unspecified: Secondary | ICD-10-CM

## 2015-09-29 DIAGNOSIS — M810 Age-related osteoporosis without current pathological fracture: Secondary | ICD-10-CM

## 2015-09-29 NOTE — Progress Notes (Signed)
  Subjective:    Patient ID: LEMON SUPPA, female    DOB: 1946/05/15, 69 y.o.   MRN: [id number]  Patient here today for follow up of chronic medical problems. States she recently saw her cardiologist and endocrinologist. No complaints today   Hypertension Pertinent negatives include no chest pain, headaches, neck pain, palpitations or shortness of breath.  Hyperlipidemia Pertinent negatives include no chest pain or shortness of breath.  Diabetes Pertinent negatives for hypoglycemia include no headaches. Pertinent negatives for diabetes include no chest pain.  Osteoporosis Not currently taking fosamax but does Oscal 500/200 daily   Review of Systems  Respiratory: Negative for shortness of breath.   Cardiovascular: Negative for chest pain and palpitations.  Musculoskeletal: Negative for neck pain.  Neurological: Negative for headaches.       Objective:   Physical Exam  Constitutional: She is oriented to person, place, and time. She appears well-developed and well-nourished.  HENT:  Nose: Nose normal.  Mouth/Throat: Oropharynx is clear and moist.  Eyes: EOM are normal.  Neck: Trachea normal, normal range of motion and full passive range of motion without pain. Neck supple. No JVD present. Carotid bruit is not present. No thyromegaly present.  bil 2/6 carotid bruits  Cardiovascular: Normal rate, regular rhythm and intact distal pulses.  Exam reveals no gallop and no friction rub.   Murmur (2/6 systolic) heard. Pulmonary/Chest: Effort normal and breath sounds normal.  Abdominal: Soft. Bowel sounds are normal. She exhibits no distension and no mass. There is no tenderness.  Musculoskeletal: Normal range of motion.  Lymphadenopathy:    She has no cervical adenopathy.  Neurological: She is alert and oriented to person, place, and time. She has normal reflexes.  Skin: Skin is warm and dry.  Psychiatric: She has a normal mood and affect. Her behavior is normal. Judgment and thought  content normal.    BP 133/64 mmHg  Pulse 63  Temp(Src) 97.5 F (36.4 C) (Oral)  Ht 5\' 4"  (1.626 m)  Wt 208 lb (94.348 kg)  BMI 35.69 kg/m2      Assessment & Plan:  1. Essential hypertension No added salt to diet, and continue with exercise  2. Type 2 diabetes mellitus without complication, without long-term current use of insulin ([place]) Recent visit to Endocrinologist who manages diabetic medications  3. Osteoporosis Weight bearing exercises, and OTC medications  4. Hyperlipidemia Low fat diet  *Labs completed last week at different office, copies brought to office at this visit   Continue all meds Health Maintenance reviewed Diet and exercise encouraged RTO 3 months  Mary-Margaret Hassell Done, FNP

## 2015-09-29 NOTE — Patient Instructions (Signed)

## 2015-10-19 ENCOUNTER — Encounter: Payer: Self-pay | Admitting: Nurse Practitioner

## 2015-10-25 ENCOUNTER — Other Ambulatory Visit: Payer: Self-pay | Admitting: Nurse Practitioner

## 2016-01-11 ENCOUNTER — Encounter: Payer: Self-pay | Admitting: Cardiology

## 2016-01-11 ENCOUNTER — Ambulatory Visit (INDEPENDENT_AMBULATORY_CARE_PROVIDER_SITE_OTHER): Payer: Medicare Other | Admitting: Cardiology

## 2016-01-11 VITALS — BP 128/60 | HR 72 | Ht 65.0 in | Wt 212.0 lb

## 2016-01-11 DIAGNOSIS — I350 Nonrheumatic aortic (valve) stenosis: Secondary | ICD-10-CM | POA: Diagnosis not present

## 2016-01-11 DIAGNOSIS — R011 Cardiac murmur, unspecified: Secondary | ICD-10-CM

## 2016-01-11 DIAGNOSIS — I10 Essential (primary) hypertension: Secondary | ICD-10-CM

## 2016-01-11 NOTE — Progress Notes (Signed)
HPI The patient presents for follow up of severe AS on echo.  She continues to deny any new symptoms. She still goes up and down 12 stairs in her home routinely and she has no new dyspnea.  The patient denies any new symptoms such as chest discomfort, neck or arm discomfort. There has been no new shortness of breath, PND or orthopnea. There have been no reported palpitations, presyncope or syncope.  Allergies  Allergen Reactions  . Sulfa Antibiotics     Current Outpatient Prescriptions  Medication Sig Dispense Refill  . aspirin 325 MG EC tablet Take 325 mg by mouth daily.    [name] atorvastatin (LIPITOR) 10 MG tablet Take 0.5 tablets (5 mg total) by mouth daily at 6 PM. Take one half pill daily 90 tablet 1  . beta carotene w/minerals (OCUVITE) tablet Take 1 tablet by mouth daily.    . calcium carbonate (OS-CAL) 600 MG TABS tablet Take 600 mg by mouth daily with breakfast.     . cholecalciferol (VITAMIN D) 1000 UNITS tablet Take 1,000 Units by mouth daily.    [name] diltiazem (CARDIZEM CD) 120 MG 24 hr capsule Take 1 capsule by mouth  daily 90 capsule 1  . HUMALOG 100 UNIT/ML injection 3 (three) times daily with meals. Using sliding scale.    . hydrochlorothiazide (HYDRODIURIL) 25 MG tablet Take 1 tablet by mouth  daily 90 tablet 1  . LANTUS 100 UNIT/ML injection Inject 35 Units into the skin at bedtime.     [name] lisinopril (PRINIVIL,ZESTRIL) 40 MG tablet Take 1 tablet by mouth  daily 90 tablet 1  . Magnesium 250 MG TABS Take 1 tablet by mouth daily.    . metFORMIN (GLUCOPHAGE-XR) 500 MG 24 hr tablet Take 500 mg by mouth 2 (two) times daily.     . Multiple Vitamin (MULTIVITAMIN) capsule Take 1 capsule by mouth daily.    . Omega 3 1000 MG CAPS Take 1,000 mg by mouth daily.    [name] pyridOXINE (VITAMIN B-6) 100 MG tablet Take 100 mg by mouth daily.    . vitamin C (ASCORBIC ACID) 500 MG tablet Take 500 mg by mouth daily.    [name] TEST test strip Test four times daily.     No current  facility-administered medications for this visit.    Past Medical History  Diagnosis Date  . Hypertension   . Diabetes mellitus without complication ([place])   . Aortic stenosis, severe 2015  . Osteoporosis   . Cataract     Past Surgical History  Procedure Laterality Date  . Abdominal hysterectomy    . Appendectomy    . Fracture surgery      shoulder   ROS:  As  stated in the HPI and negative for all other systems.  PHYSICAL EXAM BP 128/60 mmHg  Pulse 72  Ht 5\' 5"  (1.651 m)  Wt 212 lb (96.163 kg)  BMI 35.28 kg/m2 GENERAL:  Well appearing NECK:  No jugular venous distention, waveform within normal limits, carotid upstroke brisk and symmetric, no bruits, no thyromegaly LUNGS:  Clear to auscultation bilaterally HEART:  PMI not displaced or sustained,S1 and S2 within normal limits, no S3, no S4, no clicks, no rubs, apical systolic murmur 3/6, mid to late peaking, radiating out the aortic outflow tract and into the carotids, no change with Valsalva or position, no diastolic murmurs ABD:  Flat, positive bowel sounds normal in frequency in pitch, no bruits, no rebound, no guarding, no midline pulsatile mass, no  hepatomegaly, no splenomegaly EXT:  2 plus pulses throughout, trace ankle edema, no cyanosis no clubbing  EKG:  Sinus rhythm, rate 69  axis within normal limits, intervals within normal limits, no acute ST-T wave changes.  01/11/2016   ASSESSMENT AND PLAN  AORTIC STENOSIS:  I had a long discussion with her today again. She is asymptomatic with this as above.  I will see her back in 6 months. I will repeat an echo now as it has been one year.    OVERWEIGHT:   The patient understands the need to lose weight with diet and exercise. We have discussed specific strategies for this.  HTN:  The blood pressure is at target. No change in medications is indicated. We will continue with therapeutic lifestyle changes (TLC).

## 2016-01-11 NOTE — Patient Instructions (Signed)
Medication Instructions:  The current medical regimen is effective;  continue present plan and medications.  Testing/Procedures: Your physician has requested that you have an echocardiogram. Echocardiography is a painless test that uses sound waves to create images of your heart. It provides your doctor with information about the size and shape of your heart and how well your heart's chambers and valves are working. This procedure takes approximately one hour. There are no restrictions for this procedure.  Follow-Up: Follow up in 6 months with Dr. Percival Spanish in [place].  You will receive a letter in the mail 2 months before you are due.  Please call us when you receive this letter to schedule your follow up appointment.  If you need a refill on your cardiac medications before your next appointment, please call your pharmacy.  Thank you for choosing [facility]!!

## 2016-01-12 ENCOUNTER — Encounter: Payer: Self-pay | Admitting: Cardiology

## 2016-01-12 DIAGNOSIS — I350 Nonrheumatic aortic (valve) stenosis: Secondary | ICD-10-CM | POA: Insufficient documentation

## 2016-01-24 ENCOUNTER — Ambulatory Visit (HOSPITAL_COMMUNITY): Payer: Medicare Other | Attending: Cardiology

## 2016-01-24 ENCOUNTER — Other Ambulatory Visit: Payer: Self-pay

## 2016-01-24 DIAGNOSIS — R011 Cardiac murmur, unspecified: Secondary | ICD-10-CM

## 2016-01-24 DIAGNOSIS — E119 Type 2 diabetes mellitus without complications: Secondary | ICD-10-CM | POA: Diagnosis not present

## 2016-01-24 DIAGNOSIS — I119 Hypertensive heart disease without heart failure: Secondary | ICD-10-CM | POA: Diagnosis not present

## 2016-01-24 DIAGNOSIS — I350 Nonrheumatic aortic (valve) stenosis: Secondary | ICD-10-CM | POA: Diagnosis not present

## 2016-01-24 DIAGNOSIS — I10 Essential (primary) hypertension: Secondary | ICD-10-CM

## 2016-01-30 ENCOUNTER — Telehealth: Payer: Self-pay | Admitting: Cardiology

## 2016-01-30 NOTE — Telephone Encounter (Signed)
Returning call about her Echo test results . Please call   Thanks

## 2016-01-30 NOTE — Telephone Encounter (Signed)
Reviewed results with pt and schedule appt for f/u 5/24 in [place] with Dr Percival Spanish.

## 2016-01-30 NOTE — Telephone Encounter (Signed)
Message routed to P. Raul Del, RN

## 2016-02-15 LAB — HM DIABETES EYE EXAM

## 2016-02-22 ENCOUNTER — Other Ambulatory Visit: Payer: Self-pay | Admitting: Nurse Practitioner

## 2016-02-28 ENCOUNTER — Other Ambulatory Visit: Payer: Self-pay | Admitting: Nurse Practitioner

## 2016-03-21 LAB — HM MAMMOGRAPHY

## 2016-03-22 ENCOUNTER — Encounter: Payer: Self-pay | Admitting: *Deleted

## 2016-04-02 ENCOUNTER — Encounter: Payer: Self-pay | Admitting: Nurse Practitioner

## 2016-04-02 ENCOUNTER — Ambulatory Visit (INDEPENDENT_AMBULATORY_CARE_PROVIDER_SITE_OTHER): Payer: Medicare Other | Admitting: Nurse Practitioner

## 2016-04-02 VITALS — BP 142/80 | HR 65 | Temp 97.3°F | Ht 65.0 in | Wt 193.0 lb

## 2016-04-02 DIAGNOSIS — Z1212 Encounter for screening for malignant neoplasm of rectum: Secondary | ICD-10-CM

## 2016-04-02 DIAGNOSIS — I10 Essential (primary) hypertension: Secondary | ICD-10-CM | POA: Diagnosis not present

## 2016-04-02 DIAGNOSIS — I350 Nonrheumatic aortic (valve) stenosis: Secondary | ICD-10-CM

## 2016-04-02 DIAGNOSIS — E119 Type 2 diabetes mellitus without complications: Secondary | ICD-10-CM | POA: Diagnosis not present

## 2016-04-02 DIAGNOSIS — Z1159 Encounter for screening for other viral diseases: Secondary | ICD-10-CM | POA: Diagnosis not present

## 2016-04-02 DIAGNOSIS — E785 Hyperlipidemia, unspecified: Secondary | ICD-10-CM

## 2016-04-02 LAB — BAYER DCA HB A1C WAIVED: HB A1C: 7 % — AB (ref <7.0)

## 2016-04-02 MED ORDER — ATORVASTATIN CALCIUM 10 MG PO TABS: ORAL_TABLET | ORAL | Status: DC

## 2016-04-02 MED ORDER — DILTIAZEM HCL ER COATED BEADS 120 MG PO CP24: ORAL_CAPSULE | ORAL | Status: DC

## 2016-04-02 MED ORDER — HYDROCHLOROTHIAZIDE 25 MG PO TABS: ORAL_TABLET | ORAL | Status: DC

## 2016-04-02 MED ORDER — LISINOPRIL 40 MG PO TABS: ORAL_TABLET | ORAL | Status: DC

## 2016-04-02 NOTE — Progress Notes (Signed)
Subjective:    Patient ID: Toni Cooper, female    DOB: Sep 23, 1946, 70 y.o.   MRN: [id number]  Patient here today for follow up of chronic medical problems.  Outpatient Encounter Prescriptions as of 04/02/2016  Medication Sig  . aspirin 325 MG EC tablet Take 325 mg by mouth daily.  [name] atorvastatin (LIPITOR) 10 MG tablet Take 1/2 tablet by mouth  daily at 6pm  . beta carotene w/minerals (OCUVITE) tablet Take 1 tablet by mouth daily.  . calcium carbonate (OS-CAL) 600 MG TABS tablet Take 600 mg by mouth daily with breakfast.   . cholecalciferol (VITAMIN D) 1000 UNITS tablet Take 1,000 Units by mouth daily.  [name] diltiazem (CARDIZEM CD) 120 MG 24 hr capsule Take 1 capsule by mouth  daily  . HUMALOG 100 UNIT/ML injection 3 (three) times daily with meals. Using sliding scale.  . hydrochlorothiazide (HYDRODIURIL) 25 MG tablet Take 1 tablet by mouth  daily  . LANTUS 100 UNIT/ML injection Inject 35 Units into the skin at bedtime.   [name] lisinopril (PRINIVIL,ZESTRIL) 40 MG tablet Take 1 tablet by mouth  daily  . Magnesium 250 MG TABS Take 1 tablet by mouth daily.  . metFORMIN (GLUCOPHAGE-XR) 500 MG 24 hr tablet Take 500 mg by mouth 2 (two) times daily.   . Multiple Vitamin (MULTIVITAMIN) capsule Take 1 capsule by mouth daily.  . Omega 3 1000 MG CAPS Take 1,000 mg by mouth daily.  [name] pyridOXINE (VITAMIN B-6) 100 MG tablet Take 100 mg by mouth daily.  [name] TEST test strip Test four times daily.  . vitamin C (ASCORBIC ACID) 500 MG tablet Take 500 mg by mouth daily.   No facility-administered encounter medications on file as of 04/02/2016.    * Is seeing cardiologist with aortic valve stenosis- had a change on her last echo 3  Months ago and she has follow up with him next week.  Hypertension This is a chronic problem. The problem has been waxing and waning since onset. The problem is resistant. Pertinent negatives include no chest pain, headaches, neck pain, palpitations or shortness of breath. Risk  factors for coronary artery disease include dyslipidemia, diabetes mellitus, obesity, post-menopausal state and sedentary lifestyle. Past treatments include ACE inhibitors, diuretics and calcium channel blockers. The current treatment provides significant improvement. Compliance problems include diet and exercise.  There is no history of CAD/MI or CVA.  Hyperlipidemia This is a chronic problem. The current episode started more than 1 year ago. Recent lipid tests were reviewed and are variable. Factors aggravating her hyperlipidemia include thiazides. Pertinent negatives include no chest pain or shortness of breath. Current antihyperlipidemic treatment includes statins. The current treatment provides moderate improvement of lipids. Compliance problems include adherence to diet and adherence to exercise.  Risk factors for coronary artery disease include diabetes mellitus, dyslipidemia, hypertension, post-menopausal and obesity.  Diabetes She presents for her follow-up diabetic visit. She has type 2 (sees Dr.Balan) diabetes mellitus. No MedicAlert identification noted. Her disease course has been stable. Pertinent negatives for hypoglycemia include no headaches. Pertinent negatives for diabetes include no chest pain. Pertinent negatives for diabetic complications include no CVA. Risk factors for coronary artery disease include diabetes mellitus, dyslipidemia, hypertension, obesity, post-menopausal and sedentary lifestyle. Current diabetic treatment includes oral agent (dual therapy) and insulin injections. She is compliant with treatment some of the time. Her weight is stable. She is following a generally healthy diet. When asked about meal planning, she reported none. She has not had a previous visit  with a dietitian. She rarely participates in exercise. Home blood sugar record trend: patient does not check blood sugars at home. An ACE inhibitor/angiotensin II receptor blocker is being taken. She does not see a  podiatrist.Eye exam is not current.  Osteoporosis Not currently taking fosamax but does Oscal 500/200 daily   Review of Systems  Constitutional: Negative.   HENT: Negative.   Respiratory: Negative for shortness of breath.   Cardiovascular: Negative for chest pain and palpitations.  Genitourinary: Negative.   Musculoskeletal: Negative for neck pain.  Neurological: Negative for headaches.  Psychiatric/Behavioral: Negative.   All other systems reviewed and are negative.      Objective:   Physical Exam  Constitutional: She is oriented to person, place, and time. She appears well-developed and well-nourished.  HENT:  Nose: Nose normal.  Mouth/Throat: Oropharynx is clear and moist.  Eyes: EOM are normal.  Neck: Trachea normal, normal range of motion and full passive range of motion without pain. Neck supple. No JVD present. Carotid bruit is not present. No thyromegaly present.  bil 2/6 carotid bruits  Cardiovascular: Normal rate, regular rhythm and intact distal pulses.  Exam reveals no gallop and no friction rub.   Murmur (3/6 systolic) heard. Pulmonary/Chest: Effort normal and breath sounds normal.  Abdominal: Soft. Bowel sounds are normal. She exhibits no distension and no mass. There is no tenderness.  Musculoskeletal: Normal range of motion.  Lymphadenopathy:    She has no cervical adenopathy.  Neurological: She is alert and oriented to person, place, and time. She has normal reflexes.  Skin: Skin is warm and dry.  Psychiatric: She has a normal mood and affect. Her behavior is normal. Judgment and thought content normal.    BP 142/80 mmHg  Pulse 65  Temp(Src) 97.3 F (36.3 C) (Oral)  Ht _0  (1.651 m)  Wt 193 lb (87.544 kg)  BMI 32.12 kg/m2  HgBA1c- 7.0% today- up from 6.9% in November    Assessment & Plan:  1. Essential hypertension Do not add saltto diet - CMP14+EGFR - lisinopril (PRINIVIL,ZESTRIL) 40 MG tablet; Take 1 tablet by mouth  daily  Dispense: 90  tablet; Refill: 1 - hydrochlorothiazide (HYDRODIURIL) 25 MG tablet; Take 1 tablet by mouth  daily  Dispense: 90 tablet; Refill: 1  2. Type 2 diabetes mellitus without complication, without long-term current use of insulin ([facility]) Continue to watch carbs in diet - Bayer DCA Hb A1c Waived - Microalbumin / creatinine urine ratio  3. Hyperlipidemia Low fat diet - Lipid panel - atorvastatin (LIPITOR) 10 MG tablet; Take 1/2 tablet by mouth  daily at 6pm  Dispense: 45 tablet; Refill: 1  4. Aortic stenosis Keep follow up with cardiologist - diltiazem (CARDIZEM CD) 120 MG 24 hr capsule; Take 1 capsule by mouth  daily  Dispense: 90 capsule; Refill: 1    Labs pending Health maintenance reviewed Diet and exercise encouraged Continue all meds Follow up  In 3 months   [place]ell [place], FNP

## 2016-04-02 NOTE — Addendum Note (Signed)
Addended by: [name] on: 04/02/2016 08:57 AM   Modules accepted: Orders

## 2016-04-02 NOTE — Patient Instructions (Signed)
Aortic Stenosis Aortic stenosis is a narrowing of the aortic valve. The aortic valve is a gate-like structure that is located between the lower left chamber of the heart (left ventricle) and the blood vessel that leads away from the heart (aorta). When the aortic valve is narrowed, it does not open all the way. This makes it hard for the heart to pump blood into the aorta and causes the heart to work harder. The extra work can weaken the heart over time and lead to heart failure. CAUSES  Causes of aortic stenosis include:  Calcium deposits on the aortic valve that have made the valve stiff. This condition generally affects those over the age of 8. It is the most common cause of aortic stenosis.  A birth defect.  Rheumatic fever. This is a problem that may occur after a strep throat infection that was not treated adequately. Rheumatic fever can cause permanent damage to heart valves. SIGNS AND SYMPTOMS  People with aortic stenosis usually have no symptoms until the condition becomes severe. It may take 10-20 years for mild or moderate aortic stenosis to become severe. Symptoms may include:   Shortness of breath, especially with physical activity.   Feeling weak and tired (fatigued) or getting tired easily.  Chest discomfort (angina). This may occur with minimal activity if the aortic stenosis is severe.  An irregular or faster-than-normal heartbeat.  Dizziness or fainting that happens with exertion or after taking certain heart medicines (such as nitroglycerin). DIAGNOSIS  Aortic stenosis is usually diagnosed with a physical exam and with a type of imaging test called echocardiography. During echocardiography, sound waves are used to evaluate how blood flows through the heart. If your health care provider suspects aortic stenosis but the test does not clearly show it, a procedure called cardiac catheterization may be done to diagnose the condition. Tests may also be done to evaluate heart  function. They may include:  Electrocardiography. During this test, the electrical impulses of the heart are recorded while you are lying down and sticky patches are placed on your chest, arms, and legs.  Stress tests. There is more than one type of stress test. If a stress test is needed, ask your health care provider about which type is best for you.  Blood tests. TREATMENT  Treatment depends on how severe the aortic stenosis is, your symptoms, and the problems it is causing.   Observation. If the aortic stenosis is mild, no treatment may be needed. However, you will need to have the condition checked regularly to make sure it is not getting worse or causing serious problems.  Surgery. Surgery to repair or replace the aortic valve is the most common treatment for aortic stenosis. Several types of surgeries are available. The most common are open-heart surgery and transcutaneous aortic valve replacement (TAVR). TAVR does not require that the chest be opened. It is usually performed on elderly patients and those who are not able to have open-heart surgery.  Medicines. Medicines may be given to keep symptoms from getting worse. Medicines cannot reverse aortic stenosis. HOME CARE INSTRUCTIONS   You may need to avoid certain types of physical activity. If your aortic stenosis is mild, you may need to avoid only strenuous activity. The more severe your aortic stenosis, the more activities you will need to avoid. Talk with your health care provider about the types of activity you should avoid.  Take medicines only as directed by your health care provider.  If you are a woman with  aortic valve stenosis and want to get pregnant, talk to your health care provider before you become pregnant.  If you are a woman with aortic valve stenosis and are pregnant, keep all follow-up visits with all recommended health care providers.  Keep all follow-up visits for tests, exams, and treatments as directed by  your health care provider. SEEK IMMEDIATE MEDICAL CARE IF:  You develop chest pain or tightness.   You develop shortness of breath or difficulty breathing.   You develop light-headedness or faint.   It feels like your heartbeat is irregular or faster than normal.  You have a fever.   This information is not intended to replace advice given to you by your health care provider. Make sure you discuss any questions you have with your health care provider.   Document Released: 08/04/2003 Document Revised: 07/27/2015 Document Reviewed: 10/30/2012 Elsevier Interactive Patient Education [employer].

## 2016-04-03 ENCOUNTER — Telehealth: Payer: Self-pay | Admitting: *Deleted

## 2016-04-03 LAB — CMP14+EGFR
ALT: 18 IU/L (ref 0–32)
AST: 15 IU/L (ref 0–40)
Albumin/Globulin Ratio: 1.8 (ref 1.2–2.2)
Albumin: 4.1 g/dL (ref 3.5–4.8)
Alkaline Phosphatase: 70 IU/L (ref 39–117)
BILIRUBIN TOTAL: 0.5 mg/dL (ref 0.0–1.2)
BUN/Creatinine Ratio: 25 (ref 12–28)
BUN: 20 mg/dL (ref 8–27)
CALCIUM: 9.5 mg/dL (ref 8.7–10.3)
CHLORIDE: 103 mmol/L (ref 96–106)
CO2: 25 mmol/L (ref 18–29)
Creatinine, Ser: 0.8 mg/dL (ref 0.57–1.00)
GFR calc Af Amer: 86 mL/min/{1.73_m2} (ref >59)
GFR calc non Af Amer: 75 mL/min/{1.73_m2} (ref >59)
GLUCOSE: 127 mg/dL — AB (ref 65–99)
Globulin, Total: 2.3 g/dL (ref 1.5–4.5)
Potassium: 4.6 mmol/L (ref 3.5–5.2)
Sodium: 143 mmol/L (ref 134–144)
Total Protein: 6.4 g/dL (ref 6.0–8.5)

## 2016-04-03 LAB — HEPATITIS C ANTIBODY: Hep C Virus Ab: <0.1 s/co ratio (ref 0.0–0.9)

## 2016-04-03 LAB — MICROALBUMIN / CREATININE URINE RATIO
Creatinine, Urine: 100.7 mg/dL
MICROALB/CREAT RATIO: 6.9 mg/g creat (ref 0.0–30.0)
Microalbumin, Urine: 6.9 ug/mL

## 2016-04-03 LAB — LIPID PANEL
CHOLESTEROL TOTAL: 103 mg/dL (ref 100–199)
Chol/HDL Ratio: 2.8 ratio units (ref 0.0–4.4)
HDL: 37 mg/dL — AB (ref >39)
LDL Calculated: 47 mg/dL (ref 0–99)
Triglycerides: 96 mg/dL (ref 0–149)
VLDL CHOLESTEROL CAL: 19 mg/dL (ref 5–40)

## 2016-04-03 NOTE — Telephone Encounter (Signed)
-----   Message from [facility], [place] sent at 04/03/2016  7:49 AM EDT ----- Kidney and liver function stable Cholesterol looks good Hep c negative Hgba1c discussed at appointment Continue current meds- low fat diet and exercise and recheck in 6 months

## 2016-04-04 ENCOUNTER — Other Ambulatory Visit: Payer: Medicare Other

## 2016-04-04 DIAGNOSIS — Z1212 Encounter for screening for malignant neoplasm of rectum: Secondary | ICD-10-CM

## 2016-04-06 LAB — FECAL OCCULT BLOOD, IMMUNOCHEMICAL: FECAL OCCULT BLD: NEGATIVE

## 2016-04-11 ENCOUNTER — Encounter: Payer: Self-pay | Admitting: *Deleted

## 2016-04-11 ENCOUNTER — Ambulatory Visit (INDEPENDENT_AMBULATORY_CARE_PROVIDER_SITE_OTHER): Payer: Medicare Other | Admitting: Cardiology

## 2016-04-11 ENCOUNTER — Encounter: Payer: Self-pay | Admitting: Cardiology

## 2016-04-11 VITALS — BP 130/70 | HR 76 | Ht 64.0 in | Wt 212.0 lb

## 2016-04-11 DIAGNOSIS — I350 Nonrheumatic aortic (valve) stenosis: Secondary | ICD-10-CM | POA: Diagnosis not present

## 2016-04-11 NOTE — Progress Notes (Signed)
HPI The patient presents for follow up of severe AS on echo.  She had an echo this year that has progressed since last year with a mean gradient now in the 60s.  This was in the 80s last year.  She now does reports some symptoms of dyspnea walking up an incline.  This is new.  However,she still goes up and down 12 stairs in her home routinely and she has no new dyspnea.  The patient denies any new symptoms such as chest discomfort, neck or arm discomfort. There has been no new PND or orthopnea. There have been no reported palpitations, presyncope or syncope.  She is riding an exercise bike and walking outside for exercise.   Allergies  Allergen Reactions  . Sulfa Antibiotics Nausea And Vomiting    Current Outpatient Prescriptions  Medication Sig Dispense Refill  . aspirin 325 MG EC tablet Take 325 mg by mouth daily.    [name] atorvastatin (LIPITOR) 10 MG tablet Take 1/2 tablet by mouth  daily at 6pm 45 tablet 1  . beta carotene w/minerals (OCUVITE) tablet Take 1 tablet by mouth daily.    . calcium carbonate (OS-CAL) 600 MG TABS tablet Take 600 mg by mouth daily with breakfast.     . cholecalciferol (VITAMIN D) 1000 UNITS tablet Take 1,000 Units by mouth daily.    [name] diltiazem (CARDIZEM CD) 120 MG 24 hr capsule Take 1 capsule by mouth  daily 90 capsule 1  . HUMALOG 100 UNIT/ML injection 3 (three) times daily with meals. Using sliding scale.    . hydrochlorothiazide (HYDRODIURIL) 25 MG tablet Take 1 tablet by mouth  daily 90 tablet 1  . LANTUS 100 UNIT/ML injection Inject 35 Units into the skin at bedtime.     [name] lisinopril (PRINIVIL,ZESTRIL) 40 MG tablet Take 1 tablet by mouth  daily 90 tablet 1  . Magnesium 250 MG TABS Take 1 tablet by mouth daily.    . metFORMIN (GLUCOPHAGE-XR) 500 MG 24 hr tablet Take 500 mg by mouth 2 (two) times daily.     . Multiple Vitamin (MULTIVITAMIN) capsule Take 1 capsule by mouth daily.    . Omega 3 1000 MG CAPS Take 1,000 mg by mouth daily.    [name] pyridOXINE (VITAMIN  B-6) 100 MG tablet Take 100 mg by mouth daily.    . vitamin C (ASCORBIC ACID) 500 MG tablet Take 500 mg by mouth daily.    [name] TEST test strip Test four times daily.     No current facility-administered medications for this visit.    Past Medical History  Diagnosis Date  . Hypertension   . Diabetes mellitus without complication ([place])   . Aortic stenosis, severe 2015  . Osteoporosis   . Cataract     Past Surgical History  Procedure Laterality Date  . Abdominal hysterectomy    . Appendectomy    . Fracture surgery      shoulder   ROS:  As  stated in the HPI and negative for all other systems.  PHYSICAL EXAM BP 130/70 mmHg  Pulse 76  Ht 5\' 4"  (1.626 m)  Wt 212 lb (96.163 kg)  BMI 36.37 kg/m2 GENERAL:  Well appearing NECK:  No jugular venous distention, waveform within normal limits, carotid upstroke reduced and delayed and symmetric, no bruits, no thyromegaly LUNGS:  Clear to auscultation bilaterally HEART:  PMI not displaced or sustained,S1 and S2 within normal limits, no S3, no S4, no clicks, no rubs, apical systolic murmur 3/6,  mid to late peaking, radiating out the aortic outflow tract and into the carotids, no change with Valsalva or position, no diastolic murmurs ABD:  Flat, positive bowel sounds normal in frequency in pitch, no bruits, no rebound, no guarding, no midline pulsatile mass, no hepatomegaly, no splenomegaly EXT:  2 plus pulses throughout, trace ankle edema, no cyanosis no clubbing   ASSESSMENT AND PLAN  AORTIC STENOSIS:  I had a long discussion with her today again. She has had significant progression in her valve gradient and she has some symptoms.  I think she will need AVR this year.  I will start further evaluation with a TEE  OVERWEIGHT:   The patient understands the need to lose weight with diet and exercise.  I am glad that she has started some increased activity.   HTN:  The blood pressure is at target. No change in medications is indicated.  We will continue with therapeutic lifestyle changes (TLC).

## 2016-04-11 NOTE — Patient Instructions (Signed)
Medication Instructions:  The current medical regimen is effective;  continue present plan and medications.  Testing/Procedures: Your physician has requested that you have a TEE. During a TEE, sound waves are used to create images of your heart. It provides your doctor with information about the size and shape of your heart and how well your heart's chambers and valves are working. In this test, a transducer is attached to the end of a flexible tube that's guided down your throat and into your esophagus (the tube leading from you mouth to your stomach) to get a more detailed image of your heart. You are not awake for the procedure. Please see the instruction sheet given to you today. For further information please visit [email or web address].  Follow-Up: Follow up will be scheduled after your TEE.  If you need a refill on your cardiac medications before your next appointment, please call your pharmacy.  Thank you for choosing [facility]!!

## 2016-04-16 ENCOUNTER — Other Ambulatory Visit: Payer: Self-pay | Admitting: Cardiology

## 2016-04-16 DIAGNOSIS — I350 Nonrheumatic aortic (valve) stenosis: Secondary | ICD-10-CM

## 2016-04-18 ENCOUNTER — Ambulatory Visit (HOSPITAL_BASED_OUTPATIENT_CLINIC_OR_DEPARTMENT_OTHER): Payer: Medicare Other

## 2016-04-18 ENCOUNTER — Encounter (HOSPITAL_COMMUNITY)
Admission: RE | Disposition: A | Discharge status: Home / Self Care | Payer: Self-pay | Source: Ambulatory Visit | Attending: Cardiovascular Disease

## 2016-04-18 ENCOUNTER — Ambulatory Visit (HOSPITAL_COMMUNITY)
Admission: RE | Admit: 2016-04-18 | Discharge: 2016-04-18 | Disposition: A | Discharge status: Home / Self Care | Payer: Medicare Other | Source: Ambulatory Visit | Attending: Cardiovascular Disease | Admitting: Cardiovascular Disease

## 2016-04-18 ENCOUNTER — Encounter (HOSPITAL_COMMUNITY): Payer: Self-pay

## 2016-04-18 DIAGNOSIS — Z794 Long term (current) use of insulin: Secondary | ICD-10-CM | POA: Diagnosis not present

## 2016-04-18 DIAGNOSIS — E119 Type 2 diabetes mellitus without complications: Secondary | ICD-10-CM | POA: Diagnosis not present

## 2016-04-18 DIAGNOSIS — I350 Nonrheumatic aortic (valve) stenosis: Secondary | ICD-10-CM

## 2016-04-18 DIAGNOSIS — Q211 Atrial septal defect: Secondary | ICD-10-CM | POA: Diagnosis not present

## 2016-04-18 DIAGNOSIS — Z79899 Other long term (current) drug therapy: Secondary | ICD-10-CM | POA: Insufficient documentation

## 2016-04-18 DIAGNOSIS — E663 Overweight: Secondary | ICD-10-CM | POA: Diagnosis not present

## 2016-04-18 DIAGNOSIS — Z6836 Body mass index (BMI) 36.0-36.9, adult: Secondary | ICD-10-CM | POA: Insufficient documentation

## 2016-04-18 DIAGNOSIS — M810 Age-related osteoporosis without current pathological fracture: Secondary | ICD-10-CM | POA: Diagnosis not present

## 2016-04-18 DIAGNOSIS — Z7982 Long term (current) use of aspirin: Secondary | ICD-10-CM | POA: Insufficient documentation

## 2016-04-18 DIAGNOSIS — I10 Essential (primary) hypertension: Secondary | ICD-10-CM | POA: Insufficient documentation

## 2016-04-18 DIAGNOSIS — I358 Other nonrheumatic aortic valve disorders: Secondary | ICD-10-CM | POA: Diagnosis not present

## 2016-04-18 HISTORY — PX: TEE WITHOUT CARDIOVERSION: SHX5443

## 2016-04-18 LAB — GLUCOSE, CAPILLARY: Glucose-Capillary: 114 mg/dL — ABNORMAL HIGH (ref 65–99)

## 2016-04-18 SURGERY — ECHOCARDIOGRAM, TRANSESOPHAGEAL
Anesthesia: Moderate Sedation

## 2016-04-18 MED ORDER — MIDAZOLAM HCL 10 MG/2ML IJ SOLN
INTRAMUSCULAR | Status: DC | PRN
  Administered 2016-04-18: 2 mg via INTRAVENOUS
  Administered 2016-04-18: 1 mg via INTRAVENOUS

## 2016-04-18 MED ORDER — FENTANYL CITRATE (PF) 100 MCG/2ML IJ SOLN
INTRAMUSCULAR | Status: AC
  Filled 2016-04-18: qty 2

## 2016-04-18 MED ORDER — SODIUM CHLORIDE 0.9 % IV SOLN
INTRAVENOUS | Status: DC
  Administered 2016-04-18: 08:00:00 via INTRAVENOUS

## 2016-04-18 MED ORDER — MIDAZOLAM HCL 5 MG/ML IJ SOLN
INTRAMUSCULAR | Status: AC
  Filled 2016-04-18: qty 2

## 2016-04-18 MED ORDER — BUTAMBEN-TETRACAINE-BENZOCAINE 2-2-14 % EX AERO
INHALATION_SPRAY | CUTANEOUS | Status: DC | PRN
  Administered 2016-04-18: 2 via TOPICAL

## 2016-04-18 MED ORDER — FENTANYL CITRATE (PF) 100 MCG/2ML IJ SOLN
INTRAMUSCULAR | Status: DC | PRN
  Administered 2016-04-18 (×2): 25 ug via INTRAVENOUS

## 2016-04-18 NOTE — Progress Notes (Signed)
Echocardiogram Echocardiogram Transesophageal has been performed.  Tresa Res 04/18/2016, 11:01 AM

## 2016-04-18 NOTE — Discharge Instructions (Signed)

## 2016-04-18 NOTE — Interval H&P Note (Signed)
History and Physical Interval Note:  04/18/2016 9:11 AM  Toni Cooper  has presented today for surgery, with the diagnosis of aortic stenosis  The various methods of treatment have been discussed with the patient and family. After consideration of risks, benefits and other options for treatment, the patient has consented to  Procedure(s): TRANSESOPHAGEAL ECHOCARDIOGRAM (TEE) (N/A) as a surgical intervention .  The patient's history has been reviewed, patient examined, no change in status, stable for surgery.  I have reviewed the patient's chart and labs.  Questions were answered to the patient's satisfaction.     Mertie Moores

## 2016-04-18 NOTE — CV Procedure (Addendum)
    Transesophageal Echocardiogram Note  Toni Cooper [id number] 08-18-1946  Procedure: Transesophageal Echocardiogram Indications: aortic stenosis   Procedure Details Consent: Obtained Time Out: Verified patient identification, verified procedure, site/side was marked, verified correct patient position, special equipment/implants available, Radiology Safety Procedures followed,  medications/allergies/relevent history reviewed, required imaging and test results available.  Performed  Medications:  During this procedure the patient is administered a total of Versed 3  mg and Fentanyl 50  mcg  to achieve and maintain moderate conscious sedation.  The patient's heart rate, blood pressure, and oxygen saturation are monitored continuously during the procedure. The period of conscious sedation is 30 minutes  minutes, of which I was present face-to-face 100% of this time.  Left Ventrical:  Normal LV function.   + LVH  Mitral Valve: mild - moderate MR   Aortic Valve: severely calcified.   Limited leaflet mobility.  Was not able to measure a gradient but there is severe aortic stenosis   Tricuspid Valve: mild-moderate TR  Pulmonic Valve: normal   Left Atrium/ Left atrial appendage: no thrombus  Atrial septum: bows left to right.   + PFO with evidence of left to right shunting by color doppler   Aorta:  Mild calcification    Complications: No apparent complications Patient did tolerate procedure well.   Thayer Headings, Brooke Bonito., MD, [facility] 04/18/2016, 9:34 AM

## 2016-04-18 NOTE — H&P (View-Only) (Signed)
HPI The patient presents for follow up of severe AS on echo.  She had an echo this year that has progressed since last year with a mean gradient now in the 60s.  This was in the 55s last year.  She now does reports some symptoms of dyspnea walking up an incline.  This is new.  However,she still goes up and down 12 stairs in her home routinely and she has no new dyspnea.  The patient denies any new symptoms such as chest discomfort, neck or arm discomfort. There has been no new PND or orthopnea. There have been no reported palpitations, presyncope or syncope.  She is riding an exercise bike and walking outside for exercise.   Allergies  Allergen Reactions  . Sulfa Antibiotics Nausea And Vomiting    Current Outpatient Prescriptions  Medication Sig Dispense Refill  . aspirin 325 MG EC tablet Take 325 mg by mouth daily.    [name] atorvastatin (LIPITOR) 10 MG tablet Take 1/2 tablet by mouth  daily at 6pm 45 tablet 1  . beta carotene w/minerals (OCUVITE) tablet Take 1 tablet by mouth daily.    . calcium carbonate (OS-CAL) 600 MG TABS tablet Take 600 mg by mouth daily with breakfast.     . cholecalciferol (VITAMIN D) 1000 UNITS tablet Take 1,000 Units by mouth daily.    [name] diltiazem (CARDIZEM CD) 120 MG 24 hr capsule Take 1 capsule by mouth  daily 90 capsule 1  . HUMALOG 100 UNIT/ML injection 3 (three) times daily with meals. Using sliding scale.    . hydrochlorothiazide (HYDRODIURIL) 25 MG tablet Take 1 tablet by mouth  daily 90 tablet 1  . LANTUS 100 UNIT/ML injection Inject 35 Units into the skin at bedtime.     [name] lisinopril (PRINIVIL,ZESTRIL) 40 MG tablet Take 1 tablet by mouth  daily 90 tablet 1  . Magnesium 250 MG TABS Take 1 tablet by mouth daily.    . metFORMIN (GLUCOPHAGE-XR) 500 MG 24 hr tablet Take 500 mg by mouth 2 (two) times daily.     . Multiple Vitamin (MULTIVITAMIN) capsule Take 1 capsule by mouth daily.    . Omega 3 1000 MG CAPS Take 1,000 mg by mouth daily.    [name] pyridOXINE (VITAMIN  B-6) 100 MG tablet Take 100 mg by mouth daily.    . vitamin C (ASCORBIC ACID) 500 MG tablet Take 500 mg by mouth daily.    [name] TEST test strip Test four times daily.     No current facility-administered medications for this visit.    Past Medical History  Diagnosis Date  . Hypertension   . Diabetes mellitus without complication ([place])   . Aortic stenosis, severe 2015  . Osteoporosis   . Cataract     Past Surgical History  Procedure Laterality Date  . Abdominal hysterectomy    . Appendectomy    . Fracture surgery      shoulder   ROS:  As  stated in the HPI and negative for all other systems.  PHYSICAL EXAM BP 130/70 mmHg  Pulse 76  Ht 5\' 4"  (1.626 m)  Wt 212 lb (96.163 kg)  BMI 36.37 kg/m2 GENERAL:  Well appearing NECK:  No jugular venous distention, waveform within normal limits, carotid upstroke reduced and delayed and symmetric, no bruits, no thyromegaly LUNGS:  Clear to auscultation bilaterally HEART:  PMI not displaced or sustained,S1 and S2 within normal limits, no S3, no S4, no clicks, no rubs, apical systolic murmur 3/6,  mid to late peaking, radiating out the aortic outflow tract and into the carotids, no change with Valsalva or position, no diastolic murmurs ABD:  Flat, positive bowel sounds normal in frequency in pitch, no bruits, no rebound, no guarding, no midline pulsatile mass, no hepatomegaly, no splenomegaly EXT:  2 plus pulses throughout, trace ankle edema, no cyanosis no clubbing   ASSESSMENT AND PLAN  AORTIC STENOSIS:  I had a long discussion with her today again. She has had significant progression in her valve gradient and she has some symptoms.  I think she will need AVR this year.  I will start further evaluation with a TEE  OVERWEIGHT:   The patient understands the need to lose weight with diet and exercise.  I am glad that she has started some increased activity.   HTN:  The blood pressure is at target. No change in medications is indicated.  We will continue with therapeutic lifestyle changes (TLC).

## 2016-07-09 NOTE — Progress Notes (Signed)
HPI The patient presents for follow up of severe AS on echo.  She had an echo this year that has progressed since last year with a mean gradient now in the 60s.  This was in the 73s last year.  She now does reports some symptoms of dyspnea walking up an incline in the yard.  However,she still goes up and down 12 stairs in her home routinely and she has no new dyspnea.  I sent her for a TEE. This demonstrated severe AS. It was not critical.   I opted to follow her.  She returns for follow up.   She still reports that she is having no significant symptoms.  She also had ulcer stage III bicycle and she says she hasn't had any new symptoms with this.  Allergies  Allergen Reactions  . Sulfa Antibiotics Nausea And Vomiting    Current Outpatient Prescriptions  Medication Sig Dispense Refill  . aspirin 325 MG EC tablet Take 325 mg by mouth daily.    [name] atorvastatin (LIPITOR) 10 MG tablet Take 1/2 tablet by mouth  daily at 6pm 45 tablet 1  . calcium carbonate (OS-CAL) 600 MG TABS tablet Take 600 mg by mouth daily with breakfast.     . cholecalciferol (VITAMIN D) 1000 UNITS tablet Take 1,000 Units by mouth daily.    [name] diltiazem (CARDIZEM CD) 120 MG 24 hr capsule Take 1 capsule by mouth  daily 90 capsule 1  . HUMALOG 100 UNIT/ML injection 3 (three) times daily with meals. Using sliding scale.    . hydrochlorothiazide (HYDRODIURIL) 25 MG tablet Take 1 tablet by mouth  daily 90 tablet 1  . LANTUS 100 UNIT/ML injection Inject 35 Units into the skin at bedtime.     [name] lisinopril (PRINIVIL,ZESTRIL) 40 MG tablet Take 1 tablet by mouth  daily 90 tablet 1  . Magnesium 250 MG TABS Take 1 tablet by mouth daily.    . metFORMIN (GLUCOPHAGE-XR) 500 MG 24 hr tablet Take 500 mg by mouth 2 (two) times daily.     . Multiple Vitamin (MULTIVITAMIN) capsule Take 1 capsule by mouth daily.    . Omega 3 1000 MG CAPS Take 1,000 mg by mouth daily.    [name] pyridOXINE (VITAMIN B-6) 100 MG tablet Take 100 mg by mouth daily.    [name] TEST test strip Test four times daily.    . vitamin C (ASCORBIC ACID) 500 MG tablet Take 500 mg by mouth daily.    . beta carotene w/minerals (OCUVITE) tablet Take 1 tablet by mouth daily.     No current facility-administered medications for this visit.     Past Medical History:  Diagnosis Date  . Aortic stenosis, severe 2015  . Cataract   . Diabetes mellitus without complication ([place])   . Hypertension   . Osteoporosis     Past Surgical History:  Procedure Laterality Date  . ABDOMINAL HYSTERECTOMY    . APPENDECTOMY    . FRACTURE SURGERY     shoulder  . TEE WITHOUT CARDIOVERSION N/A 04/18/2016   Procedure: TRANSESOPHAGEAL ECHOCARDIOGRAM (TEE);  Surgeon: Thayer Headings, MD;  Location: [facility];  Service: Cardiovascular;  Laterality: N/A;   ROS:  Sprained ankle.   As  stated in the HPI and negative for all other systems.  PHYSICAL EXAM BP (!) 154/75   Pulse 73   Ht 5\' 4"  (1.626 m)   Wt 208 lb (94.3 kg)   BMI 35.70 kg/m  GENERAL:  Well appearing  NECK:  No jugular venous distention, waveform within normal limits, carotid upstroke reduced and delayed and symmetric, no bruits, no thyromegaly LUNGS:  Clear to auscultation bilaterally HEART:  PMI not displaced or sustained,S1 and S2 within normal limits, no S3, no S4, no clicks, no rubs, apical systolic murmur 3/6, mid to late peaking, radiating out the aortic outflow tract and into the carotids, no change with Valsalva or position, no diastolic murmurs ABD:  Flat, positive bowel sounds normal in frequency in pitch, no bruits, no rebound, no guarding, no midline pulsatile mass, no hepatomegaly, no splenomegaly EXT:  2 plus pulses throughout, trace ankle edema, no cyanosis no clubbing  EKG:  Sinus rhythm, rate 73, axis within normal limits, intervals within normal limits, no acute ST-T wave changes.  ASSESSMENT AND PLAN  AORTIC STENOSIS:  This is severe but not critical and she doesn't have any symptoms that would  make me suggest that surgical intervention is indicated but I'm going to follow very closely. We can gauge whether she is getting more short of breath walking or stairs refilling her stationary bicycle  OVERWEIGHT:   The patient understands the need to lose weight with diet and exercise.  I  HTN:  The blood pressure is slightly elevated.  She's going to keep a blood pressure diary. Further management based on this.

## 2016-07-11 ENCOUNTER — Encounter: Payer: Self-pay | Admitting: Cardiology

## 2016-07-11 ENCOUNTER — Ambulatory Visit (INDEPENDENT_AMBULATORY_CARE_PROVIDER_SITE_OTHER): Payer: Medicare Other | Admitting: Cardiology

## 2016-07-11 VITALS — BP 154/75 | HR 73 | Ht 64.0 in | Wt 208.0 lb

## 2016-07-11 DIAGNOSIS — I350 Nonrheumatic aortic (valve) stenosis: Secondary | ICD-10-CM

## 2016-07-11 NOTE — Patient Instructions (Signed)
Medication Instructions:  The current medical regimen is effective;  continue present plan and medications.  Follow-Up: Follow up in 6 months with Dr. Hochrein.  You will receive a letter in the mail 2 months before you are due.  Please call us when you receive this letter to schedule your follow up appointment.  If you need a refill on your cardiac medications before your next appointment, please call your pharmacy.  Thank you for choosing [facility]!!       

## 2016-08-06 LAB — HM DIABETES EYE EXAM

## 2016-10-04 ENCOUNTER — Ambulatory Visit (INDEPENDENT_AMBULATORY_CARE_PROVIDER_SITE_OTHER): Payer: Medicare Other | Admitting: Nurse Practitioner

## 2016-10-04 ENCOUNTER — Encounter: Payer: Self-pay | Admitting: Nurse Practitioner

## 2016-10-04 VITALS — BP 136/72 | HR 68 | Temp 97.1°F | Ht 64.0 in | Wt 210.0 lb

## 2016-10-04 DIAGNOSIS — E782 Mixed hyperlipidemia: Secondary | ICD-10-CM

## 2016-10-04 DIAGNOSIS — I350 Nonrheumatic aortic (valve) stenosis: Secondary | ICD-10-CM

## 2016-10-04 DIAGNOSIS — I10 Essential (primary) hypertension: Secondary | ICD-10-CM

## 2016-10-04 DIAGNOSIS — M810 Age-related osteoporosis without current pathological fracture: Secondary | ICD-10-CM

## 2016-10-04 MED ORDER — HYDROCHLOROTHIAZIDE 25 MG PO TABS: ORAL_TABLET | ORAL | 1 refills | Status: DC

## 2016-10-04 MED ORDER — DILTIAZEM HCL ER COATED BEADS 120 MG PO CP24: ORAL_CAPSULE | ORAL | 1 refills | Status: DC

## 2016-10-04 MED ORDER — ATORVASTATIN CALCIUM 10 MG PO TABS: ORAL_TABLET | ORAL | 1 refills | Status: DC

## 2016-10-04 MED ORDER — LISINOPRIL 40 MG PO TABS: ORAL_TABLET | ORAL | 1 refills | Status: DC

## 2016-10-04 NOTE — Progress Notes (Addendum)
Subjective:    Patient ID: Toni Cooper, female    DOB: [birth year], 70 y.o.   MRN: [id number]  Patient here today for follow up of chronic medical problems.  Outpatient Encounter Prescriptions as of 10/04/2016  Medication Sig  . aspirin 325 MG EC tablet Take 325 mg by mouth daily.  [name] atorvastatin (LIPITOR) 10 MG tablet Take 1/2 tablet by mouth  daily at 6pm  . beta carotene w/minerals (OCUVITE) tablet Take 1 tablet by mouth daily.  . calcium carbonate (OS-CAL) 600 MG TABS tablet Take 600 mg by mouth daily with breakfast.   . cholecalciferol (VITAMIN D) 1000 UNITS tablet Take 1,000 Units by mouth daily.  [name] diltiazem (CARDIZEM CD) 120 MG 24 hr capsule Take 1 capsule by mouth  daily  . HUMALOG 100 UNIT/ML injection 3 (three) times daily with meals. Using sliding scale.  . hydrochlorothiazide (HYDRODIURIL) 25 MG tablet Take 1 tablet by mouth  daily  . LANTUS 100 UNIT/ML injection Inject 35 Units into the skin at bedtime.   [name] lisinopril (PRINIVIL,ZESTRIL) 40 MG tablet Take 1 tablet by mouth  daily  . Magnesium 250 MG TABS Take 1 tablet by mouth daily.  . metFORMIN (GLUCOPHAGE-XR) 500 MG 24 hr tablet Take 500 mg by mouth 2 (two) times daily.   . Multiple Vitamin (MULTIVITAMIN) capsule Take 1 capsule by mouth daily.  . Omega 3 1000 MG CAPS Take 1,000 mg by mouth daily.  [name] pyridOXINE (VITAMIN B-6) 100 MG tablet Take 100 mg by mouth daily.  [name] TEST test strip Test four times daily.  . vitamin C (ASCORBIC ACID) 500 MG tablet Take 500 mg by mouth daily.   No facility-administered encounter medications on file as of 10/04/2016.       Hypertension  This is a chronic problem. The problem has been waxing and waning since onset. The problem is resistant. Pertinent negatives include no chest pain, headaches, neck pain, palpitations or shortness of breath. Risk factors for coronary artery disease include dyslipidemia, diabetes mellitus, obesity, post-menopausal state and sedentary lifestyle. Past  treatments include ACE inhibitors, diuretics and calcium channel blockers. The current treatment provides significant improvement. Compliance problems include diet and exercise.  There is no history of CAD/MI or CVA.  Hyperlipidemia  This is a chronic problem. The current episode started more than 1 year ago. Recent lipid tests were reviewed and are variable. Factors aggravating her hyperlipidemia include thiazides. Pertinent negatives include no chest pain or shortness of breath. Current antihyperlipidemic treatment includes statins. The current treatment provides moderate improvement of lipids. Compliance problems include adherence to diet and adherence to exercise.  Risk factors for coronary artery disease include diabetes mellitus, dyslipidemia, hypertension, post-menopausal and obesity.  Diabetes  She presents for her follow-up diabetic visit. She has type 2 (sees Dr.Balan) diabetes mellitus. No MedicAlert identification noted. Her disease course has been stable. Pertinent negatives for hypoglycemia include no headaches. Pertinent negatives for diabetes include no chest pain. Pertinent negatives for diabetic complications include no CVA. Risk factors for coronary artery disease include diabetes mellitus, dyslipidemia, hypertension, obesity, post-menopausal and sedentary lifestyle. Current diabetic treatment includes oral agent (dual therapy) and insulin injections. She is compliant with treatment some of the time. Her weight is stable. She is following a generally healthy diet. When asked about meal planning, she reported none. She has not had a previous visit with a dietitian. She rarely participates in exercise. An ACE inhibitor/angiotensin II receptor blocker is being taken. She does not see a podiatrist.Eye  exam is not current.  Osteoporosis Not currently taking fosamax but does Oscal 500/200 daily Aortic Valve stenosis She sees DR. Hochrien every 6 months- she will eventually have to have surgery.  SHe is feeling fine- no fatigue.   Review of Systems  Constitutional: Negative.   HENT: Negative.   Respiratory: Negative for shortness of breath.   Cardiovascular: Negative for chest pain and palpitations.  Genitourinary: Negative.   Musculoskeletal: Negative for neck pain.  Neurological: Negative for headaches.  Psychiatric/Behavioral: Negative.   All other systems reviewed and are negative.      Objective:   Physical Exam  Constitutional: She is oriented to person, place, and time. She appears well-developed and well-nourished.  HENT:  Nose: Nose normal.  Mouth/Throat: Oropharynx is clear and moist.  Eyes: EOM are normal.  Neck: Trachea normal, normal range of motion and full passive range of motion without pain. Neck supple. No JVD present. Carotid bruit is not present. No thyromegaly present.  bil 2/6 carotid bruits  Cardiovascular: Normal rate, regular rhythm and intact distal pulses.  Exam reveals no gallop and no friction rub.   Murmur (3/6 systolic) heard. Pulmonary/Chest: Effort normal and breath sounds normal.  Abdominal: Soft. Bowel sounds are normal. She exhibits no distension and no mass. There is no tenderness.  Musculoskeletal: Normal range of motion.  Lymphadenopathy:    She has no cervical adenopathy.  Neurological: She is alert and oriented to person, place, and time. She has normal reflexes.  Skin: Skin is warm and dry.  Psychiatric: She has a normal mood and affect. Her behavior is normal. Judgment and thought content normal.   BP 136/72 (BP Location: Left Arm, Cuff Size: Normal)   Pulse 68   Temp 97.1 F (36.2 C) (Oral)   Ht 5\' 4"  (1.626 m)   Wt 210 lb (95.3 kg)   BMI 36.05 kg/m    HgBA1c-  7.3 % at Dr. Porfirio Oar office   Assessment & Plan:  1. Nonrheumatic aortic valve stenosis Keep follow up with Dr. Warren Lacy - diltiazem (CARDIZEM CD) 120 MG 24 hr capsule; Take 1 capsule by mouth  daily  Dispense: 90 capsule; Refill: 1  2. Essential  hypertension Do nt add salt to diet - lisinopril (PRINIVIL,ZESTRIL) 40 MG tablet; Take 1 tablet by mouth  daily  Dispense: 90 tablet; Refill: 1 - hydrochlorothiazide (HYDRODIURIL) 25 MG tablet; Take 1 tablet by mouth  daily  Dispense: 90 tablet; Refill: 1  3. Mixed hyperlipidemia Low fat diet - atorvastatin (LIPITOR) 10 MG tablet; Take 1/2 tablet by mouth  daily at 6pm  Dispense: 45 tablet; Refill: 1    Labs discussed at appointment- results brought in from Dr. Porfirio Oar office Health maintenance reviewed Diet and exercise encouraged Continue all meds Follow up  In 3 months   [place], FNP

## 2016-10-04 NOTE — Patient Instructions (Signed)
Bone Health Introduction Bones protect organs, store calcium, and anchor muscles. Good health habits, such as eating nutritious foods and exercising regularly, are important for maintaining healthy bones. They can also help to prevent a condition that causes bones to lose density and become weak and brittle (osteoporosis). Why is bone mass important? Bone mass refers to the amount of bone tissue that you have. The higher your bone mass, the stronger your bones. An important step toward having healthy bones throughout life is to have strong and dense bones during childhood. A young adult who has a high bone mass is more likely to have a high bone mass later in life. Bone mass at its greatest it is called peak bone mass. A large decline in bone mass occurs in older adults. In women, it occurs about the time of menopause. During this time, it is important to practice good health habits, because if more bone is lost than what is replaced, the bones will become less healthy and more likely to break (fracture). If you find that you have a low bone mass, you may be able to prevent osteoporosis or further bone loss by changing your diet and lifestyle. How can I find out if my bone mass is low? Bone mass can be measured with an X-ray test that is called a bone mineral density (BMD) test. This test is recommended for all women who are age 65 or older. It may also be recommended for men who are age 70 or older, or for people who are more likely to develop osteoporosis due to:  Having bones that break easily.  Having a long-term disease that weakens bones, such as kidney disease or rheumatoid arthritis.  Having menopause earlier than normal.  Taking medicine that weakens bones, such as steroids, thyroid hormones, or hormone treatment for breast cancer or prostate cancer.  Smoking.  Drinking three or more alcoholic drinks each day. What are the nutritional recommendations for healthy bones? To have healthy  bones, you need to get enough of the right minerals and vitamins. Most nutrition experts recommend getting these nutrients from the foods that you eat. Nutritional recommendations vary from person to person. Ask your health care provider what is healthy for you. Here are some general guidelines. Calcium Recommendations  Calcium is the most important (essential) mineral for bone health. Most people can get enough calcium from their diet, but supplements may be recommended for people who are at risk for osteoporosis. Good sources of calcium include:  Dairy products, such as low-fat or nonfat milk, cheese, and yogurt.  Dark green leafy vegetables, such as bok choy and broccoli.  Calcium-fortified foods, such as orange juice, cereal, bread, soy beverages, and tofu products.  Nuts, such as almonds. Follow these recommended amounts for daily calcium intake:  Children, age 1?3: 700 mg.  Children, age 4?8: 1,000 mg.  Children, age 9?13: 1,300 mg.  Teens, age 14?18: 1,300 mg.  Adults, age 19?50: 1,000 mg.  Adults, age 51?70:  Men: 1,000 mg.  Women: 1,200 mg.  Adults, age 71 or older: 1,200 mg.  Pregnant and breastfeeding females:  Teens: 1,300 mg.  Adults: 1,000 mg. Vitamin D Recommendations  Vitamin D is the most essential vitamin for bone health. It helps the body to absorb calcium. Sunlight stimulates the skin to make vitamin D, so be sure to get enough sunlight. If you live in a cold climate or you do not get outside often, your health care provider may recommend that you take vitamin   D supplements. Good sources of vitamin D in your diet include:  Egg yolks.  Saltwater fish.  Milk and cereal fortified with vitamin D. Follow these recommended amounts for daily vitamin D intake:  Children and teens, age 1?18: 600 international units.  Adults, age 50 or younger: 400-800 international units.  Adults, age 51 or older: 800-1,000 international units. Other Nutrients  Other  nutrients for bone health include:  Phosphorus. This mineral is found in meat, poultry, dairy foods, nuts, and legumes. The recommended daily intake for adult men and adult women is 700 mg.  Magnesium. This mineral is found in seeds, nuts, dark green vegetables, and legumes. The recommended daily intake for adult men is 400?420 mg. For adult women, it is 310?320 mg.  Vitamin K. This vitamin is found in green leafy vegetables. The recommended daily intake is 120 mg for adult men and 90 mg for adult women. What type of physical activity is best for building and maintaining healthy bones? Weight-bearing and strength-building activities are important for building and maintaining peak bone mass. Weight-bearing activities cause muscles and bones to work against gravity. Strength-building activities increases muscle strength that supports bones. Weight-bearing and muscle-building activities include:  Walking and hiking.  Jogging and running.  Dancing.  Gym exercises.  Lifting weights.  Tennis and racquetball.  Climbing stairs.  Aerobics. Adults should get at least 30 minutes of moderate physical activity on most days. Children should get at least 60 minutes of moderate physical activity on most days. Ask your health care provide what type of exercise is best for you. Where can I find more information? For more information, check out the following websites:  National Osteoporosis Foundation: http://nof.org/learn/basics  National Institutes of Health: http://www.niams.nih.gov/Health_Info/Bone/Bone_Health/bone_health_for_life.asp This information is not intended to replace advice given to you by your health care provider. Make sure you discuss any questions you have with your health care provider. Document Released: 01/26/2004 Document Revised: 05/25/2016 Document Reviewed: 11/10/2014  2017 Elsevier  

## 2016-11-28 ENCOUNTER — Ambulatory Visit (INDEPENDENT_AMBULATORY_CARE_PROVIDER_SITE_OTHER): Payer: Medicare Other

## 2016-11-28 DIAGNOSIS — M810 Age-related osteoporosis without current pathological fracture: Secondary | ICD-10-CM

## 2016-12-06 ENCOUNTER — Ambulatory Visit: Payer: Medicare Other | Admitting: Pharmacist

## 2016-12-10 ENCOUNTER — Ambulatory Visit (INDEPENDENT_AMBULATORY_CARE_PROVIDER_SITE_OTHER): Payer: Medicare Other | Admitting: Pharmacist

## 2016-12-10 DIAGNOSIS — M810 Age-related osteoporosis without current pathological fracture: Secondary | ICD-10-CM

## 2016-12-10 MED ORDER — CALCIUM CARBONATE-VITAMIN D 500-200 MG-UNIT PO TABS: 1.0000 | ORAL_TABLET | Freq: Two times a day (BID) | ORAL | Status: DC

## 2016-12-10 NOTE — Progress Notes (Signed)
Patient ID: Toni Cooper, female   DOB: June 05, 1946, 71 y.o.   MRN: [id number]   HPI: Patient with osteoporosis.  She previously took alendronate 70mg  1 tablet weekly from 03/2010 until 11/2014.   She tried Forteo but was unable to tolerate - caused bone pain.   She has a history of shoulder fracture, wrist fracture 05/2010 and another wrist fracture 06/2012.  Back Pain?  Yes       Kyphosis?  No                                                               PMH: Hysterectomy?  Yes Oophorectomy?  Yes HRT? No Steroid Use?  No Thyroid med?  No History of cancer?  No History of digestive disorders (ie Crohn's)?  No Current or previous eating disorders?  No Last Vitamin D Result:  31.0 (03/14/2015) Last GFR Result:  75 (04/02/2016)   FH/SH: Family history of osteoporosis?  No Parent with history of hip fracture?  No Family history of breast cancer?  No Exercise?  Yes - stationary bike 15 minutes 1 or 2 times daily Smoking?  No Alcohol?  No      Calcium Assessment       Calcium Intake  # of servings/day  Calcium mg   Milk (8 oz) 0  x  300  = 0   Yogurt (4 oz) 0 x  200 = 0   Cheese (1 oz) 0 x  200 = 0   Other Calcium sources   250mg    Ca supplement Calcium supplement 600mg  qam = 600mg     Estimated calcium intake per day 850mg       Objective:  Body mass index is 35.69 kg/(m^2). Ht 5\' 4"  (1.626 m)  Wt 208 lb (94.348 kg)  BMI 35.69 kg/m2    DEXA Results Date of Test T-Score for AP Spine L1-L2 T-Score for Neck of Right Hip  11/28/2016 -3.4 -1.6  09/15/2014 -4.1 -1.7  08/20/2012 -2.6 -1.5  02/22/2010 -2.7 -2.3         Assessment:   Assessment: Osteoporosis - BMD improved over last 2.5 years.      Plan:     Recommendations: 1.  Continue to hold pharmacotherapy for osteoporosis. Will consult with patient's endocrinologist about treatment 2.  recommend calcium 1200mg  daily through supplementation or diet. Change to calcium 500mg  take 1 tablet BID  (patient is taking MVI but is Mulitbetic MVI which does not contain calcium. 3.  recommend weight bearing exercise - 30 minutes at least 4 days per week. Recommended she consider balance classes at [facility] to strengthen core and legs. 4.  Counseled and educated about fall risk and prevention.  Recheck DEXA:  2 years  Time spent counseling patient:  30 minutes  Cherre Robins, PharmD, CPP

## 2016-12-10 NOTE — Patient Instructions (Signed)
Exercise for Strong Bones  Exercise is important to build and maintain strong bones / bone density.  There are 2 types of exercises that are important to building and maintaining strong bones:  Weight- bearing and muscle-stregthening.  Weight-bearing Exercises  These exercises include activities that make you move against gravity while staying upright. Weight-bearing exercises can be high-impact or low-impact.  High-impact weight-bearing exercises help build bones and keep them strong. If you have broken a bone due to osteoporosis or are at risk of breaking a bone, you may need to avoid high-impact exercises. If you're not sure, you should check with your healthcare provider.  Examples of high-impact weight-bearing exercises are: Dancing  Doing high-impact aerobics  Hiking  Jogging/running  Jumping Rope  Stair climbing  Tennis  Low-impact weight-bearing exercises can also help keep bones strong and are a safe alternative if you cannot do high-impact exercises.   Examples of low-impact weight-bearing exercises are: Using elliptical training machines  Doing low-impact aerobics  Using stair-step machines  Fast walking on a treadmill or outside   Muscle-Strengthening Exercises These exercises include activities where you move your body, a weight or some other resistance against gravity. They are also known as resistance exercises and include: Lifting weights  Using elastic exercise bands  Using weight machines  Lifting your own body weight  Functional movements, such as standing and rising up on your toes  Yoga and Pilates can also improve strength, balance and flexibility. However, certain positions may not be safe for people with osteoporosis or those at increased risk of broken bones. For example, exercises that have you bend forward may increase the chance of breaking a bone in the spine.   Non-Impact Exercises There are other types of exercises that can help  prevent falls.  Non-impact exercises can help you to improve balance, posture and how well you move in everyday activities. Some of these exercises include: Balance exercises that strengthen your legs and test your balance, such as Tai Chi, can decrease your risk of falls.  Posture exercises that improve your posture and reduce rounded or "sloping" shoulders can help you decrease the chance of breaking a bone, especially in the spine.  Functional exercises that improve how well you move can help you with everyday activities and decrease your chance of falling and breaking a bone. For example, if you have trouble getting up from a chair or climbing stairs, you should do these activities as exercises.   **A physical therapist can teach you balance, posture and functional exercises. He/she can also help you learn which exercises are safe and appropriate for you.  [facility] has a physical therapy office in [place] in front of our office and referrals can be made for assessments and treatment as needed and strength and balance training.  If you would like to have an assessment with [facility] and our physical therapy team please let a nurse or provider know.   Calcium & Vitamin D: The Facts  Why is calcium and vitamin D consumption important? Calcium: . Most Americans do not consume adequate amounts of calcium! Calcium is required for proper muscle function, nerve communication, bone support, and many other functions in the body.  . The body uses bones as a source of calcium. Bones 'remodel' themselves continuously - the body constantly breaks bone down to release calcium and rebuilds bones by replacing calcium in the bone later.  . As we get older, the rate of bone breakdown occurs faster than bone rebuilding which  could lead to osteopenia, osteoporosis, and possible fractures.   Vitamin D: . People naturally make vitamin D in the body when sunlight hits the skin and triggers a process that leads to  vitamin D production. This natural vitamin D production requires about 10-15 minutes of sun exposure on the hands, arms, and face at least 2-3 times per week. However, due to decreased sun exposure and the use of sunscreen, most people will need to get additional vitamin D from foods or supplements. Your doctor can measure your body's vitamin D level through a simple blood test to determine your daily vitamin D needs.  . Vitamin D is used to help the body absorb calcium, maintain bone health, help the immune system, and reduce inflammation. It also plays a role in muscle performance, balance and risk of falling.  . Vitamin D deficiency can lead to osteomalacia or softening of the bones, bone pain, and muscle weakness.   The recommended daily allowance of Calcium and Vitamin D varies for different age groups. Age group Calcium (mg) Vitamin D (IU)  Females and Males: Age 35-50 1000 mg 600 IU  Females: Age 23- 43 1200 mg 600 IU  Males: Age 86-70 1000 mg 600 IU  Females and Males: Age 18+ 1200 mg 800 IU  Pregnant/lactating Females age 33-50 1000 mg 600 IU   How much Calcium do you get in your diet? Calcium Intake # of servings per day  Total calcium (mg)  Skim milk, 2% milk (1 cup) _________ x 300 mg   Yogurt (1 small container) _________ x 200 mg   Cheese (1oz) _________ x 200 mg   Cottage Cheese (1 cup)             ________ x 150 mg   Almond milk (1 cup) _________ x 450 mg   Fortified Orange Juice (1 cup) _________ x 300 mg   Broccoli or spinach ( 1 cup) _________ x 100 mg   Salmon (3 oz) _________ x 150 mg    Almonds (1/4 cup) _______ x 90 mg      How do we get Calcium and Vitamin D in our diet? Calcium: . Obtaining calcium from the diet is the most preferred way to reach the recommended daily goal. If this goal is not reached through diet, calcium supplements are available.  . Calcium is found in many foods including: dairy products, dark leafy vegetables (like broccoli, kale, and  spinach), fish, and fortified products like juices and cereals.  . The food label will have a %DV (percent daily value) listed showing the amount of calcium per serving. To determine the total mg per serving, simply replace the % with zero (0).  For example, Almond Breeze almond milk contains 45% DV of calcium or 471m per 1 cup.  . You can increase the amount of calcium in your diet by using more calcium products in your daily meals. Use yogurt and fruit to make smoothies or use yogurt to top baked potatoes or make whipped potatoes. Sprinkle low fat cheese onto salads or into egg white omelets. You can even add non-fat dry milk powder (3039mcalcium per 1/3 cup) to hot cereals, meat loaf, soups, or potatoes.  . Calcium supplements come in many forms including tablets, chewables, and gummies. Be sure to read the label to determine the correct number of tablets per serving and whether or not to take the supplement with food.  . Calcium carbonate products (Oscal, Caltrate, and Viactiv) are generally better  absorbed when taken with food while calcium citrate products like Citracal can be taken with or without food.  . The body can only absorb about 600 mg of calcium at one time. It is recommended to take calcium supplements in small amounts several times per day.  However, taking it all at once is better than not taking it at all. . Increasing your intake of calcium is essential for bone health, but may also lead to some side effects like constipation, increased gas, bloating or abdominal cramping. To help reduce these side effects, start with 1 tablet per day and slowly increase your intake of the supplement to the recommended doses. It is also recommended that you drink plenty of water each day. Vitamin D: . Very few foods naturally contain vitamin D. However, it is found in saltwater fish (like tuna, salmon and mackerel), beef liver, egg yolks, cheese and vitamin D fortified foods (like yogurt, cereals,  orange juice and milk) . The amount of vitamin D in each food or product is listed as %DV on the product label. To determine the total amount of vitamin D per serving, drop the % sign and multiply the number by 4. For example, 1 cup of Almond Breeze almond milk contains 25% DV vitamin D or 100 IU per serving (25 x 4 =100). . Vitamin D is also found in multivitamins and supplements and may be listed as ergocalciferol (vitamin D2) or cholecalciferol (vitamin D3). Each of these forms of vitamin D are equivalent and the daily recommended intake will vary based on your age and the vitamin D levels in your body. Follow your doctor's recommendation for vitamin D intake.        Fall Prevention in the Home Falls can cause injuries and can affect people from all age groups. There are many simple things that you can do to make your home safe and to help prevent falls. What can I do on the outside of my home? Regularly repair the edges of walkways and driveways and fix any cracks. Remove high doorway thresholds. Trim any shrubbery on the main path into your home. Use bright outdoor lighting. Clear walkways of debris and clutter, including tools and rocks. Regularly check that handrails are securely fastened and in good repair. Both sides of any steps should have handrails. Install guardrails along the edges of any raised decks or porches. Have leaves, snow, and ice cleared regularly. Use sand or salt on walkways during winter months. In the garage, clean up any spills right away, including grease or oil spills. What can I do in the bathroom? Use night lights. Install grab bars by the toilet and in the tub and shower. Do not use towel bars as grab bars. Use non-skid mats or decals on the floor of the tub or shower. If you need to sit down while you are in the shower, use a plastic, non-slip stool. Keep the floor dry. Immediately clean up any water that spills on the floor. Remove soap buildup in the tub  or shower on a regular basis. Attach bath mats securely with double-sided non-slip rug tape. Remove throw rugs and other tripping hazards from the floor. What can I do in the bedroom? Use night lights. Make sure that a bedside light is easy to reach. Do not use oversized bedding that drapes onto the floor. Have a firm chair that has side arms to use for getting dressed. Remove throw rugs and other tripping hazards from the floor. What can I  do in the kitchen? Clean up any spills right away. Avoid walking on wet floors. Place frequently used items in easy-to-reach places. If you need to reach for something above you, use a sturdy step stool that has a grab bar. Keep electrical cables out of the way. Do not use floor polish or wax that makes floors slippery. If you have to use wax, make sure that it is non-skid floor wax. Remove throw rugs and other tripping hazards from the floor. What can I do in the stairways? Do not leave any items on the stairs. Make sure that there are handrails on both sides of the stairs. Fix handrails that are broken or loose. Make sure that handrails are as long as the stairways. Check any carpeting to make sure that it is firmly attached to the stairs. Fix any carpet that is loose or worn. Avoid having throw rugs at the top or bottom of stairways, or secure the rugs with carpet tape to prevent them from moving. Make sure that you have a light switch at the top of the stairs and the bottom of the stairs. If you do not have them, have them installed. What are some other fall prevention tips? Wear closed-toe shoes that fit well and support your feet. Wear shoes that have rubber soles or low heels. When you use a stepladder, make sure that it is completely opened and that the sides are firmly locked. Have someone hold the ladder while you are using it. Do not climb a closed stepladder. Add color or contrast paint or tape to grab bars and handrails in your home. Place  contrasting color strips on the first and last steps. Use mobility aids as needed, such as canes, walkers, scooters, and crutches. Turn on lights if it is dark. Replace any light bulbs that burn out. Set up furniture so that there are clear paths. Keep the furniture in the same spot. Fix any uneven floor surfaces. Choose a carpet design that does not hide the edge of steps of a stairway. Be aware of any and all pets. Review your medicines with your healthcare provider. Some medicines can cause dizziness or changes in blood pressure, which increase your risk of falling. Talk with your health care provider about other ways that you can decrease your risk of falls. This may include working with a physical therapist or trainer to improve your strength, balance, and endurance. This information is not intended to replace advice given to you by your health care provider. Make sure you discuss any questions you have with your health care provider. Document Released: 10/26/2002 Document Revised: 04/03/2016 Document Reviewed: 12/10/2014 Elsevier Interactive Patient Education  2017 [employer].

## 2017-02-05 NOTE — Progress Notes (Signed)
HPI The patient presents for follow up of severe AS on echo.  She had an echo this year that has progressed since last year with a mean gradient now in the 60s.  This was in the 22s last year.  She now does reports some symptoms of dyspnea walking up an incline in the yard.  However,she still goes up and down 12 stairs in her home routinely and she has no new dyspnea.  I sent her for a TEE. This demonstrated severe AS. It was not critical.   I opted to follow her.  She returns for follow up. Since I last saw her she has done well.  She rides her stationary bike twice each day and has to go up and down the stairs and she denies any symptoms with this .  She did have to walk up a significant incline to go to a softball game and she had some dyspnea with this but she recovered quickly.    She denies any chest pain, presyncope or syncope.  Allergies  Allergen Reactions  . Sulfa Antibiotics Nausea And Vomiting    Current Outpatient Prescriptions  Medication Sig Dispense Refill  . aspirin 325 MG EC tablet Take 325 mg by mouth daily.    [name] atorvastatin (LIPITOR) 10 MG tablet Take 1/2 tablet by mouth  daily at 6pm 45 tablet 1  . beta carotene w/minerals (OCUVITE) tablet Take 1 tablet by mouth daily.    . calcium-vitamin D (OSCAL 500/200 D-3) 500-200 MG-UNIT tablet Take 1 tablet by mouth 2 (two) times daily.    . cholecalciferol (VITAMIN D) 1000 UNITS tablet Take 1,000 Units by mouth daily.    [name] diltiazem (CARDIZEM CD) 120 MG 24 hr capsule Take 1 capsule by mouth  daily 90 capsule 1  . HUMALOG 100 UNIT/ML injection 3 (three) times daily with meals. Using sliding scale.    . hydrochlorothiazide (HYDRODIURIL) 25 MG tablet Take 1 tablet by mouth  daily 90 tablet 1  . LANTUS 100 UNIT/ML injection Inject 35 Units into the skin at bedtime.     [name] lisinopril (PRINIVIL,ZESTRIL) 40 MG tablet Take 1 tablet by mouth  daily 90 tablet 1  . Magnesium 250 MG TABS Take 1 tablet by mouth daily.    . metFORMIN  (GLUCOPHAGE-XR) 500 MG 24 hr tablet Take 500 mg by mouth 2 (two) times daily.     . Multiple Vitamin (MULTIVITAMIN) capsule Take 1 capsule by mouth daily.    . Omega 3 1000 MG CAPS Take 1,000 mg by mouth daily.    [name] pyridOXINE (VITAMIN B-6) 100 MG tablet Take 100 mg by mouth daily.    . vitamin C (ASCORBIC ACID) 500 MG tablet Take 500 mg by mouth daily.    [name] TEST test strip Test four times daily.     No current facility-administered medications for this visit.     Past Medical History:  Diagnosis Date  . Aortic stenosis, severe 2015  . Cataract   . Diabetes mellitus without complication ([place])   . Hypertension   . Osteoporosis     Past Surgical History:  Procedure Laterality Date  . ABDOMINAL HYSTERECTOMY    . APPENDECTOMY    . FRACTURE SURGERY     shoulder  . TEE WITHOUT CARDIOVERSION N/A 04/18/2016   Procedure: TRANSESOPHAGEAL ECHOCARDIOGRAM (TEE);  Surgeon: Thayer Headings, MD;  Location: [facility] ENDOSCOPY;  Service: Cardiovascular;  Laterality: N/A;   ROS:  GERD.   As  stated in  the HPI and negative for all other systems.  PHYSICAL EXAM BP 124/62   Pulse 76   Ht 5\' 4"  (1.626 m)   Wt 214 lb (97.1 kg)   BMI 36.73 kg/m  GENERAL:  Well appearing NECK:  No jugular venous distention, waveform within normal limits, carotid upstroke reduced and delayed and symmetric, no bruits, no thyromegaly LUNGS:  Clear to auscultation bilaterally HEART:  PMI not displaced or sustained,S1 and S2 within normal limits, no S3, no S4, no clicks, no rubs, apical systolic murmur 3/6, mid to late peaking, radiating out the aortic outflow tract and into the carotids, no change with Valsalva or position, no diastolic murmurs ABD:  Flat, positive bowel sounds normal in frequency in pitch, no bruits, no rebound, no guarding, no midline pulsatile mass, no hepatomegaly, no splenomegaly EXT:  2 plus pulses throughout, trace ankle edema, no cyanosis no clubbing  EKG:  Sinus rhythm, rate 76, axis within  normal limits, intervals within normal limits, no acute ST-T wave changes.  ASSESSMENT AND PLAN  AORTIC STENOSIS:  This is severe but not critical and she doesn't have any symptoms that would make me suggest that surgical intervention is indicated but I'm going to follow very closely.   I will check an echo in May which will be one year from the most recent echo.   OVERWEIGHT:   The patient understands the need to lose weight with diet and exercise.  I discussed this with her today.   HTN:  The blood pressure is OK.  She will continue with the meds as listed.

## 2017-02-06 ENCOUNTER — Ambulatory Visit (INDEPENDENT_AMBULATORY_CARE_PROVIDER_SITE_OTHER): Payer: Medicare Other | Admitting: Cardiology

## 2017-02-06 ENCOUNTER — Encounter: Payer: Self-pay | Admitting: Cardiology

## 2017-02-06 VITALS — BP 124/62 | HR 76 | Ht 64.0 in | Wt 214.0 lb

## 2017-02-06 DIAGNOSIS — I350 Nonrheumatic aortic (valve) stenosis: Secondary | ICD-10-CM

## 2017-02-06 NOTE — Patient Instructions (Signed)
Medication Instructions:  The current medical regimen is effective;  continue present plan and medications.  Testing/Procedures: Your physician has requested that you have an echocardiogram. Echocardiography is a painless test that uses sound waves to create images of your heart. It provides your doctor with information about the size and shape of your heart and how well your heart's chambers and valves are working. This procedure takes approximately one hour. There are no restrictions for this procedure.  Follow-Up: Follow up in May with Dr Percival Spanish.  If you need a refill on your cardiac medications before your next appointment, please call your pharmacy.  Thank you for choosing [facility]!!

## 2017-02-07 ENCOUNTER — Encounter: Payer: Self-pay | Admitting: Cardiology

## 2017-03-21 ENCOUNTER — Other Ambulatory Visit: Payer: Self-pay | Admitting: Nurse Practitioner

## 2017-03-21 DIAGNOSIS — I10 Essential (primary) hypertension: Secondary | ICD-10-CM

## 2017-03-21 DIAGNOSIS — E782 Mixed hyperlipidemia: Secondary | ICD-10-CM

## 2017-03-21 DIAGNOSIS — I350 Nonrheumatic aortic (valve) stenosis: Secondary | ICD-10-CM

## 2017-04-04 ENCOUNTER — Ambulatory Visit (INDEPENDENT_AMBULATORY_CARE_PROVIDER_SITE_OTHER): Payer: Medicare Other | Admitting: Nurse Practitioner

## 2017-04-04 ENCOUNTER — Encounter: Payer: Self-pay | Admitting: Nurse Practitioner

## 2017-04-04 VITALS — BP 137/70 | HR 70 | Temp 97.1°F | Ht 64.0 in | Wt 213.0 lb

## 2017-04-04 DIAGNOSIS — E119 Type 2 diabetes mellitus without complications: Secondary | ICD-10-CM | POA: Diagnosis not present

## 2017-04-04 DIAGNOSIS — M810 Age-related osteoporosis without current pathological fracture: Secondary | ICD-10-CM | POA: Diagnosis not present

## 2017-04-04 DIAGNOSIS — I350 Nonrheumatic aortic (valve) stenosis: Secondary | ICD-10-CM

## 2017-04-04 DIAGNOSIS — I10 Essential (primary) hypertension: Secondary | ICD-10-CM

## 2017-04-04 DIAGNOSIS — E785 Hyperlipidemia, unspecified: Secondary | ICD-10-CM | POA: Diagnosis not present

## 2017-04-04 LAB — BAYER DCA HB A1C WAIVED: HB A1C: 7.1 % — AB (ref <7.0)

## 2017-04-04 NOTE — Patient Instructions (Signed)
Echocardiogram An echocardiogram, or echocardiography, uses sound waves (ultrasound) to produce an image of your heart. The echocardiogram is simple, painless, obtained within a short period of time, and offers valuable information to your health care provider. The images from an echocardiogram can provide information such as:  Evidence of coronary artery disease (CAD).  Heart size.  Heart muscle function.  Heart valve function.  Aneurysm detection.  Evidence of a past heart attack.  Fluid buildup around the heart.  Heart muscle thickening.  Assess heart valve function.  Tell a health care provider about:  Any allergies you have.  All medicines you are taking, including vitamins, herbs, eye drops, creams, and over-the-counter medicines.  Any problems you or family members have had with anesthetic medicines.  Any blood disorders you have.  Any surgeries you have had.  Any medical conditions you have.  Whether you are pregnant or may be pregnant. What happens before the procedure? No special preparation is needed. Eat and drink normally. What happens during the procedure?  In order to produce an image of your heart, gel will be applied to your chest and a wand-like tool (transducer) will be moved over your chest. The gel will help transmit the sound waves from the transducer. The sound waves will harmlessly bounce off your heart to allow the heart images to be captured in real-time motion. These images will then be recorded.  You may need an IV to receive a medicine that improves the quality of the pictures. What happens after the procedure? You may return to your normal schedule including diet, activities, and medicines, unless your health care provider tells you otherwise. This information is not intended to replace advice given to you by your health care provider. Make sure you discuss any questions you have with your health care provider. Document Released: 11/02/2000  Document Revised: 06/23/2016 Document Reviewed: 07/13/2013 Elsevier Interactive Patient Education  2017 Elsevier Inc.  

## 2017-04-04 NOTE — Progress Notes (Signed)
Subjective:    Patient ID: Toni Cooper, female    DOB: [birth year], 71 y.o.   MRN: [id number]  HPI  Toni Cooper is here today for follow up of chronic medical problem.  Outpatient Encounter Prescriptions as of 04/04/2017  Medication Sig  . aspirin 325 MG EC tablet Take 325 mg by mouth daily.  [name] atorvastatin (LIPITOR) 10 MG tablet TAKE 1/2 TABLET BY MOUTH  DAILY AT 6PM  . beta carotene w/minerals (OCUVITE) tablet Take 1 tablet by mouth daily.  . calcium-vitamin D (OSCAL 500/200 D-3) 500-200 MG-UNIT tablet Take 1 tablet by mouth 2 (two) times daily.  [name] CARTIA XT 120 MG 24 hr capsule TAKE 1 CAPSULE BY MOUTH  DAILY  . cholecalciferol (VITAMIN D) 1000 UNITS tablet Take 1,000 Units by mouth daily.  [name] HUMALOG 100 UNIT/ML injection 3 (three) times daily with meals. Using sliding scale.  . hydrochlorothiazide (HYDRODIURIL) 25 MG tablet TAKE 1 TABLET BY MOUTH  DAILY  . LANTUS 100 UNIT/ML injection Inject 35 Units into the skin at bedtime.   [name] lisinopril (PRINIVIL,ZESTRIL) 40 MG tablet TAKE 1 TABLET BY MOUTH  DAILY  . Magnesium 250 MG TABS Take 1 tablet by mouth daily.  . metFORMIN (GLUCOPHAGE-XR) 500 MG 24 hr tablet Take 500 mg by mouth 2 (two) times daily.   . Multiple Vitamin (MULTIVITAMIN) capsule Take 1 capsule by mouth daily.  . Omega 3 1000 MG CAPS Take 1,000 mg by mouth daily.  [name] pyridOXINE (VITAMIN B-6) 100 MG tablet Take 100 mg by mouth daily.  [name] TEST test strip Test four times daily.  . vitamin C (ASCORBIC ACID) 500 MG tablet Take 500 mg by mouth daily.   No facility-administered encounter medications on file as of 04/04/2017.     1. Type 2 diabetes mellitus without complication, without long-term current use of insulin (HCC) last HGBA1C was 7.0%- fasting blood sugars between 110-120. No hypoglycemia. Sees DR. Balan.  2. Hyperlipidemia, unspecified hyperlipidemia type  On lipitor- does not watch diet very closely  3. Essential hypertension  No c/o chest pain, SOB or HA- does  not check blood pressures at home  4. Nonrheumatic aortic valve stenosis  Sees cardiologist every 3 months- has echo cardiogram scheduled for next week  5. Age-related osteoporosis without current pathological fracture  Does not do designated weight bearing exercise    New complaints: None today     Review of Systems  Constitutional: Negative for diaphoresis.  Eyes: Negative for pain.  Respiratory: Negative for shortness of breath.   Cardiovascular: Negative for chest pain, palpitations and leg swelling.  Gastrointestinal: Negative for abdominal pain.  Endocrine: Negative for polydipsia.  Skin: Negative for rash.  Neurological: Negative for dizziness, weakness and headaches.  Hematological: Does not bruise/bleed easily.       Objective:   Physical Exam  Constitutional: She is oriented to person, place, and time. She appears well-developed and well-nourished.  HENT:  Nose: Nose normal.  Mouth/Throat: Oropharynx is clear and moist.  Eyes: EOM are normal.  Neck: Trachea normal, normal range of motion and full passive range of motion without pain. Neck supple. No JVD present. Carotid bruit is not present. No thyromegaly present.  Cardiovascular: Normal rate, regular rhythm and intact distal pulses.  Exam reveals no gallop and no friction rub.   Murmur (3/6 systolic murmur) heard. Pulmonary/Chest: Effort normal and breath sounds normal.  Abdominal: Soft. Bowel sounds are normal. She exhibits no distension and no mass. There is no  tenderness.  Musculoskeletal: Normal range of motion.  Lymphadenopathy:    She has no cervical adenopathy.  Neurological: She is alert and oriented to person, place, and time. She has normal reflexes.  Skin: Skin is warm and dry.  Psychiatric: She has a normal mood and affect. Her behavior is normal. Judgment and thought content normal.   BP 137/70   Pulse 70   Temp 97.1 F (36.2 C) (Oral)   Ht 5\' 4"  (1.626 m)   Wt 213 lb (96.6 kg)   BMI 36.56  kg/m          Assessment & Plan:   1. Type 2 diabetes mellitus without complication, without long-term current use of insulin ([place])   2. Hyperlipidemia, unspecified hyperlipidemia type   3. Essential hypertension   4. Nonrheumatic aortic valve stenosis   5. Age-related osteoporosis without current pathological fracture    Continue all meds Labs pending Diet and exercise encouraged Keep follow up appointments with dr. Warren Lacy RTO in 6 months follow up  [place], [employer]

## 2017-04-05 LAB — CMP14+EGFR
A/G RATIO: 1.9 (ref 1.2–2.2)
ALBUMIN: 4 g/dL (ref 3.5–4.8)
ALK PHOS: 66 IU/L (ref 39–117)
ALT: 16 IU/L (ref 0–32)
AST: 17 IU/L (ref 0–40)
BILIRUBIN TOTAL: 0.4 mg/dL (ref 0.0–1.2)
BUN / CREAT RATIO: 7 — AB (ref 12–28)
BUN: 7 mg/dL — ABNORMAL LOW (ref 8–27)
CHLORIDE: 105 mmol/L (ref 96–106)
CO2: 25 mmol/L (ref 18–29)
Calcium: 9.1 mg/dL (ref 8.7–10.3)
Creatinine, Ser: 1.02 mg/dL — ABNORMAL HIGH (ref 0.57–1.00)
GFR calc non Af Amer: 55 mL/min/{1.73_m2} — ABNORMAL LOW (ref >59)
GFR, EST AFRICAN AMERICAN: 64 mL/min/{1.73_m2} (ref >59)
GLOBULIN, TOTAL: 2.1 g/dL (ref 1.5–4.5)
GLUCOSE: 146 mg/dL — AB (ref 65–99)
Potassium: 4.2 mmol/L (ref 3.5–5.2)
SODIUM: 145 mmol/L — AB (ref 134–144)
Total Protein: 6.1 g/dL (ref 6.0–8.5)

## 2017-04-05 LAB — LIPID PANEL
CHOLESTEROL TOTAL: 98 mg/dL — AB (ref 100–199)
Chol/HDL Ratio: 2.6 ratio (ref 0.0–4.4)
HDL: 38 mg/dL — ABNORMAL LOW (ref >39)
LDL Calculated: 49 mg/dL (ref 0–99)
Triglycerides: 54 mg/dL (ref 0–149)
VLDL Cholesterol Cal: 11 mg/dL (ref 5–40)

## 2017-04-05 LAB — MICROALBUMIN / CREATININE URINE RATIO
CREATININE, UR: 133.7 mg/dL
MICROALB/CREAT RATIO: 9.1 mg/g{creat} (ref 0.0–30.0)
MICROALBUM., U, RANDOM: 12.1 ug/mL

## 2017-04-09 ENCOUNTER — Ambulatory Visit (HOSPITAL_COMMUNITY): Payer: Medicare Other | Attending: Cardiovascular Disease

## 2017-04-09 ENCOUNTER — Other Ambulatory Visit: Payer: Self-pay

## 2017-04-09 DIAGNOSIS — I350 Nonrheumatic aortic (valve) stenosis: Secondary | ICD-10-CM

## 2017-04-09 DIAGNOSIS — I081 Rheumatic disorders of both mitral and tricuspid valves: Secondary | ICD-10-CM | POA: Diagnosis not present

## 2017-04-15 NOTE — Progress Notes (Signed)
HPI The patient presents for follow up of severe AS on echo.  The recent echo again suggests severe AS.  This has been slowly progressive.  Her last meeting gradient was 59. Velocity was 4.9 m/s. She does have some mild dyspnea walking up stairs. She's noticed this up a slight incline. She's not had any presyncope or syncope. She has not had any chest pressure, neck or arm discomfort. She denies any weight gain or edema.  I did follow-up the recent echocardiogram which is about the same as it was a year ago.   Allergies  Allergen Reactions  . Sulfa Antibiotics Nausea And Vomiting    Current Outpatient Prescriptions  Medication Sig Dispense Refill  . aspirin EC 81 MG tablet Take 81 mg by mouth daily.    [name] atorvastatin (LIPITOR) 10 MG tablet TAKE 1/2 TABLET BY MOUTH  DAILY AT 6PM 45 tablet 1  . beta carotene w/minerals (OCUVITE) tablet Take 1 tablet by mouth daily.    . calcium-vitamin D (OSCAL 500/200 D-3) 500-200 MG-UNIT tablet Take 1 tablet by mouth 2 (two) times daily.    [name] CARTIA XT 120 MG 24 hr capsule TAKE 1 CAPSULE BY MOUTH  DAILY 90 capsule 1  . cholecalciferol (VITAMIN D) 1000 UNITS tablet Take 1,000 Units by mouth daily.    [name] HUMALOG 100 UNIT/ML injection 3 (three) times daily with meals. Using sliding scale.    . hydrochlorothiazide (HYDRODIURIL) 25 MG tablet TAKE 1 TABLET BY MOUTH  DAILY 90 tablet 1  . LANTUS 100 UNIT/ML injection Inject 35 Units into the skin at bedtime.     [name] lisinopril (PRINIVIL,ZESTRIL) 40 MG tablet TAKE 1 TABLET BY MOUTH  DAILY 90 tablet 1  . Magnesium 250 MG TABS Take 1 tablet by mouth daily.    . metFORMIN (GLUCOPHAGE-XR) 500 MG 24 hr tablet Take 500 mg by mouth 2 (two) times daily.     . Multiple Vitamin (MULTIVITAMIN) capsule Take 1 capsule by mouth daily.    . Omega 3 1000 MG CAPS Take 1,000 mg by mouth daily.    [name] pyridOXINE (VITAMIN B-6) 100 MG tablet Take 100 mg by mouth daily.    . vitamin C (ASCORBIC ACID) 500 MG tablet Take 500 mg by mouth  daily.    [name] TEST test strip Test four times daily.     No current facility-administered medications for this visit.     Past Medical History:  Diagnosis Date  . Aortic stenosis, severe 2015  . Cataract   . Diabetes mellitus without complication ([place])   . Hypertension   . Osteoporosis     Past Surgical History:  Procedure Laterality Date  . ABDOMINAL HYSTERECTOMY    . APPENDECTOMY    . FRACTURE SURGERY     shoulder  . TEE WITHOUT CARDIOVERSION N/A 04/18/2016   Procedure: TRANSESOPHAGEAL ECHOCARDIOGRAM (TEE);  Surgeon: Thayer Headings, MD;  Location: [facility] ENDOSCOPY;  Service: Cardiovascular;  Laterality: N/A;   ROS:  Positive for GERD.  Otherwise as stated in the HPI and negative for all other systems.  PHYSICAL EXAM BP (!) 142/72   Pulse 76   Ht 5\' 4"  (1.626 m)   Wt 213 lb (96.6 kg)   BMI 36.56 kg/m   GENERAL:  Well appearing NECK:  No jugular venous distention, waveform within normal limits, carotid upstroke brisk and symmetric, Transmitted systolic murmur, no bruits bruits, no thyromegaly LUNGS:  Clear to auscultation bilaterally BACK:  No CVA tenderness CHEST:  Unremarkable  HEART:  PMI not displaced or sustained,S1 and S2 within normal limits, no S3, no S4, no clicks, no rubs, 3 out of 6 late peaking systolic murmur radiating out the aortic outflow tract and into the carotids, no diastolic murmurs ABD:  Flat, positive bowel sounds normal in frequency in pitch, no bruits, no rebound, no guarding, no midline pulsatile mass, no hepatomegaly, no splenomegaly EXT:  2 plus pulses throughout, mild ankle edema, no cyanosis no clubbing   ASSESSMENT AND PLAN  AORTIC STENOSIS:   This is severe but still class C1.  The peak velocity is 4.9.   We discussed these measurements are a class  IIa indication for valve replacement even in a relatively asymptomatic person. Actually she's having some dyspnea which she wasn't having before and I don't really think she is asymptomatic  at this point. Therefore, it's time to plan elective valve replacement and she would like to do this in the fall. We will contact each other in late August and I will schedule cardiac catheterization surgical referral.   OVERWEIGHT:    The patient understands the need to lose weight with diet and exercise.  I discussed this with her again today.   DM:  A1C 7.1.  This is followed by [name], FNP  HTN:  The blood pressure is upper limits.  I would like her to lose weight.  She will continue current meds.

## 2017-04-16 ENCOUNTER — Other Ambulatory Visit: Payer: Medicare Other

## 2017-04-16 DIAGNOSIS — Z1212 Encounter for screening for malignant neoplasm of rectum: Secondary | ICD-10-CM

## 2017-04-17 ENCOUNTER — Encounter: Payer: Self-pay | Admitting: Cardiology

## 2017-04-17 ENCOUNTER — Ambulatory Visit (INDEPENDENT_AMBULATORY_CARE_PROVIDER_SITE_OTHER): Payer: Medicare Other | Admitting: Cardiology

## 2017-04-17 VITALS — BP 142/72 | HR 76 | Ht 64.0 in | Wt 213.0 lb

## 2017-04-17 DIAGNOSIS — E663 Overweight: Secondary | ICD-10-CM

## 2017-04-17 DIAGNOSIS — I10 Essential (primary) hypertension: Secondary | ICD-10-CM | POA: Diagnosis not present

## 2017-04-17 DIAGNOSIS — I350 Nonrheumatic aortic (valve) stenosis: Secondary | ICD-10-CM | POA: Diagnosis not present

## 2017-04-17 NOTE — Patient Instructions (Signed)
Medication Instructions:  The current medical regimen is effective;  continue present plan and medications.  Follow-Up: Follow up in the fall of 2018.   If you need a refill on your cardiac medications before your next appointment, please call your pharmacy.  Thank you for choosing [facility]!!

## 2017-04-19 ENCOUNTER — Other Ambulatory Visit: Payer: Self-pay | Admitting: Pharmacist

## 2017-04-19 LAB — FECAL OCCULT BLOOD, IMMUNOCHEMICAL: Fecal Occult Bld: NEGATIVE

## 2017-04-19 MED ORDER — ALENDRONATE SODIUM 70 MG PO TABS: 70.0000 mg | ORAL_TABLET | ORAL | 3 refills | Status: DC

## 2017-04-22 ENCOUNTER — Ambulatory Visit (INDEPENDENT_AMBULATORY_CARE_PROVIDER_SITE_OTHER): Payer: Medicare Other | Admitting: Nurse Practitioner

## 2017-04-22 ENCOUNTER — Encounter: Payer: Self-pay | Admitting: Nurse Practitioner

## 2017-04-22 VITALS — BP 113/67 | HR 81 | Temp 97.1°F | Ht 64.0 in | Wt 211.0 lb

## 2017-04-22 DIAGNOSIS — W57XXXA Bitten or stung by nonvenomous insect and other nonvenomous arthropods, initial encounter: Secondary | ICD-10-CM | POA: Diagnosis not present

## 2017-04-22 DIAGNOSIS — S30860A Insect bite (nonvenomous) of lower back and pelvis, initial encounter: Secondary | ICD-10-CM | POA: Diagnosis not present

## 2017-04-22 MED ORDER — DOXYCYCLINE HYCLATE 100 MG PO TABS: 100.0000 mg | ORAL_TABLET | Freq: Two times a day (BID) | ORAL | 0 refills | Status: DC

## 2017-04-22 NOTE — Patient Instructions (Signed)
Tick Bite Information Introduction Ticks are insects that attach themselves to the skin. There are many types of ticks. Common types include wood ticks and deer ticks. Sometimes, ticks carry diseases that can make a person very ill. The most common places for ticks to attach themselves are the scalp, neck, armpits, waist, and groin. HOW CAN YOU PREVENT TICK BITES? Take these steps to help prevent tick bites when you are outdoors:  Wear long sleeves and long pants.  Wear white clothes so you can see ticks more easily.  Tuck your pant legs into your socks.  If walking on a trail, stay in the middle of the trail to avoid brushing against bushes.  Avoid walking through areas with long grass.  Put bug spray on all skin that is showing and along boot tops, pant legs, and sleeve cuffs.  Check clothes, hair, and skin often and before going inside.  Brush off any ticks that are not attached.  Take a shower or bath as soon as possible after being outdoors.  HOW SHOULD YOU REMOVE A TICK? Ticks should be removed as soon as possible to help prevent diseases. 1. If latex gloves are available, put them on before trying to remove a tick. 2. Use tweezers to grasp the tick as close to the skin as possible. You may also use curved forceps or a tick removal tool. Grasp the tick as close to its head as possible. Avoid grasping the tick on its body. 3. Pull gently upward until the tick lets go. Do not twist the tick or jerk it suddenly. This may break off the tick's head or mouth parts. 4. Do not squeeze or crush the tick's body. This could force disease-carrying fluids from the tick into your body. 5. After the tick is removed, wash the bite area and your hands with soap and water or alcohol. 6. Apply a small amount of antiseptic cream or ointment to the bite site. 7. Wash any tools that were used.  Do not try to remove a tick by applying a hot match, petroleum jelly, or fingernail polish to the tick.  These methods do not work. They may also increase the chances of disease being spread from the tick bite. WHEN SHOULD YOU SEEK HELP? Contact your health care provider if you are unable to remove a tick or if a part of the tick breaks off in the skin. After a tick bite, you need to watch for signs and symptoms of diseases that can be spread by ticks. Contact your health care provider if you develop any of the following:  Fever.  Rash.  Redness and puffiness (swelling) in the area of the tick bite.  Tender, puffy lymph glands.  Watery poop (diarrhea).  Weight loss.  Cough.  Feeling more tired than normal (fatigue).  Muscle, joint, or bone pain.  Belly (abdominal) pain.  Headache.  Change in your level of consciousness.  Trouble walking or moving your legs.  Loss of feeling (numbness) in the legs.  Loss of movement (paralysis).  Shortness of breath.  Confusion.  Throwing up (vomiting) many times.  This information is not intended to replace advice given to you by your health care provider. Make sure you discuss any questions you have with your health care provider. Document Released: 01/30/2010 Document Revised: 04/12/2016 Document Reviewed: 04/15/2013 Elsevier Interactive Patient Education  2018 Elsevier Inc.  

## 2017-04-22 NOTE — Progress Notes (Signed)
   Subjective:    Patient ID: Toni Cooper, female    DOB: 01-11-1946, 71 y.o.   MRN: [id number]  HPI Patient comes in today c/o tick bite. Left red ring on her back where she removed it. Not sure how long was attached.    Review of Systems  Constitutional: Negative.  Negative for fatigue and fever.  Respiratory: Negative.   Cardiovascular: Negative.   Genitourinary: Negative.   Neurological: Negative.   Psychiatric/Behavioral: Negative.   All other systems reviewed and are negative.      Objective:   Physical Exam  Constitutional: She appears well-developed and well-nourished.  Cardiovascular: Normal rate.   Murmur (4/6 systolic murmur) heard. Pulmonary/Chest: Effort normal.  Neurological: She is alert.  Psychiatric: She has a normal mood and affect. Her behavior is normal. Judgment and thought content normal.   BP 113/67   Pulse 81   Temp 97.1 F (36.2 C) (Oral)   Ht 5\' 4"  (1.626 m)   Wt 211 lb (95.7 kg)   BMI 36.22 kg/m      Assessment & Plan:   1. Tick bite of back, initial encounter    Meds ordered this encounter  Medications  . doxycycline (VIBRA-TABS) 100 MG tablet    Sig: Take 1 tablet (100 mg total) by mouth 2 (two) times daily. 1 po bid    Dispense:  28 tablet    Refill:  0    Order Specific Question:   Supervising Provider    Answer:   Evette Doffing, CAROL L [[id number]]   Do not pick or scratch at area RTO prn  Mary-Margaret Hassell Done, FNP

## 2017-07-16 NOTE — Progress Notes (Signed)
HPI The patient presents for follow up of severe AS on echo.  The recent echo again suggests severe AS.  This has been slowly progressive.  Her last meeting gradient was 59. Velocity was 4.9 m/s.  She has at least a class IIa indication for aortic valve replacement.  She comes back to talk about further work up prior to elective valve surgery.  She had had continued dyspnea walking up an incline.  She has recently had two episodes of her heart racing.  One was on an exercise bike but faster than she thought it would be and one was at rest.  They lasted about 5 minutes.  She did not have worse SOB or pain with this.  She has a vague chest pressure only at night.  The patient denies any new symptoms such neck or arm discomfort. There has been no new PND or orthopnea. There has been no presyncope or syncope.   Allergies  Allergen Reactions  . Sulfa Antibiotics Nausea And Vomiting    Current Outpatient Prescriptions  Medication Sig Dispense Refill  . alendronate (FOSAMAX) 70 MG tablet Take 1 tablet (70 mg total) by mouth every 7 (seven) days. Take with a full glass of water on an empty stomach. 12 tablet 3  . aspirin EC 81 MG tablet Take 81 mg by mouth daily.    [name] atorvastatin (LIPITOR) 10 MG tablet TAKE 1/2 TABLET BY MOUTH  DAILY AT 6PM 45 tablet 1  . beta carotene w/minerals (OCUVITE) tablet Take 1 tablet by mouth daily.    . calcium-vitamin D (OSCAL 500/200 D-3) 500-200 MG-UNIT tablet Take 1 tablet by mouth 2 (two) times daily.    [name] CARTIA XT 120 MG 24 hr capsule TAKE 1 CAPSULE BY MOUTH  DAILY 90 capsule 1  . cholecalciferol (VITAMIN D) 1000 UNITS tablet Take 1,000 Units by mouth daily.    [name] HUMALOG 100 UNIT/ML injection 3 (three) times daily with meals. Using sliding scale.    . hydrochlorothiazide (HYDRODIURIL) 25 MG tablet TAKE 1 TABLET BY MOUTH  DAILY 90 tablet 1  . LANTUS 100 UNIT/ML injection Inject 35 Units into the skin at bedtime.     [name] lisinopril (PRINIVIL,ZESTRIL) 40 MG tablet  TAKE 1 TABLET BY MOUTH  DAILY 90 tablet 1  . Magnesium 250 MG TABS Take 1 tablet by mouth daily.    . metFORMIN (GLUCOPHAGE-XR) 500 MG 24 hr tablet Take 500 mg by mouth 2 (two) times daily.     . Multiple Vitamin (MULTIVITAMIN) capsule Take 1 capsule by mouth daily.    . Omega 3 1000 MG CAPS Take 1,000 mg by mouth daily.    [name] pyridOXINE (VITAMIN B-6) 100 MG tablet Take 100 mg by mouth daily.    . vitamin C (ASCORBIC ACID) 500 MG tablet Take 500 mg by mouth daily.    [name] TEST test strip Test four times daily.     No current facility-administered medications for this visit.     Past Medical History:  Diagnosis Date  . Aortic stenosis, severe 2015  . Cataract   . Diabetes mellitus without complication ([place])   . Hypertension   . Osteoporosis     Past Surgical History:  Procedure Laterality Date  . ABDOMINAL HYSTERECTOMY    . APPENDECTOMY    . FRACTURE SURGERY     shoulder  . TEE WITHOUT CARDIOVERSION N/A 04/18/2016   Procedure: TRANSESOPHAGEAL ECHOCARDIOGRAM (TEE);  Surgeon: Thayer Headings, MD;  Location: [facility]  ENDOSCOPY;  Service: Cardiovascular;  Laterality: N/A;   ROS:  Positive for GERD.   Otherwise as stated in the HPI and negative for all other systems.    PHYSICAL EXAM BP 122/60   Pulse 76   Ht 5\' 4"  (1.626 m)   Wt 212 lb (96.2 kg)   BMI 36.39 kg/m   GENERAL:  Well appearing NECK:  No jugular venous distention, waveform within normal limits, carotid upstroke brisk and symmetric, no bruits, no thyromegaly LUNGS:  Clear to auscultation bilaterally CHEST:  Unremarkable HEART:  PMI not displaced or sustained,S1 and S2 within normal limits, no S3, no S4, no clicks, no rubs, 3/6 apical systolic murmur radiating out the outflow tract, no diastolic murmurs ABD:  Flat, positive bowel sounds normal in frequency in pitch, no bruits, no rebound, no guarding, no midline pulsatile mass, no hepatomegaly, no splenomegaly EXT:  2 plus pulses throughout, no edema, no cyanosis no  clubbing   EKG:  NSR, rate 75, axis WNL intervals WNL, no acute ST T wave changes.    ASSESSMENT AND PLAN  AORTIC STENOSIS:    She has a class IIa indication for valve replacement.  I think she is starting to get some symptoms.  I am going to send her for right and left heart cath.  The patient understands that risks included but are not limited to stroke (1 in 1000), death (1 in 30), kidney failure [usually temporary] (1 in 500), bleeding (1 in 200), allergic reaction [possibly serious] (1 in 200).  The patient understands and agrees to proceed.   OVERWEIGHT:  She understands the reasons to lose weight with diet and exercise.   DM:  A1C 7.1.   No change in therapy.  This is followed by [name], FNP  HTN:  The blood pressure is WNL.  No change in therapy.   TACHYCARDIA:  I don't know the mechanism .  It is not frequent enough yet to even suggest event monitor.  I asked her to consider an Alive Cor

## 2017-07-17 ENCOUNTER — Ambulatory Visit (INDEPENDENT_AMBULATORY_CARE_PROVIDER_SITE_OTHER): Payer: Medicare Other | Admitting: Cardiology

## 2017-07-17 ENCOUNTER — Encounter: Payer: Self-pay | Admitting: Cardiology

## 2017-07-17 ENCOUNTER — Other Ambulatory Visit: Payer: Medicare Other

## 2017-07-17 VITALS — BP 122/60 | HR 76 | Ht 64.0 in | Wt 212.0 lb

## 2017-07-17 DIAGNOSIS — Z01818 Encounter for other preprocedural examination: Secondary | ICD-10-CM

## 2017-07-17 DIAGNOSIS — I350 Nonrheumatic aortic (valve) stenosis: Secondary | ICD-10-CM

## 2017-07-17 DIAGNOSIS — R002 Palpitations: Secondary | ICD-10-CM | POA: Diagnosis not present

## 2017-07-17 NOTE — Patient Instructions (Addendum)
Medication Instructions:  The current medical regimen is effective;  continue present plan and medications.  Labwork: Please have blood work today or in the next couple of days. (BMP, CBC and PT/INR)  Testing/Procedures: Your physician has requested that you have a cardiac catheterization. Cardiac catheterization is used to diagnose and/or treat various heart conditions. Doctors may recommend this procedure for a number of different reasons. The most common reason is to evaluate chest pain. Chest pain can be a symptom of coronary artery disease (CAD), and cardiac catheterization can show whether plaque is narrowing or blocking your heart's arteries. This procedure is also used to evaluate the valves, as well as measure the blood flow and oxygen levels in different parts of your heart. For further information please visit [email or web address]. Please follow instruction sheet, as given.  Follow-Up: Follow up with Dr Percival Spanish after your cardiac cath.  If you need a refill on your cardiac medications before your next appointment, please call your pharmacy.  Thank you for choosing [facility]!!     [facility] - phone app    [address] [place] [address] Dept: [phone] Loc: [place]  07/17/2017  You are scheduled for a cardiac cath on Thursday, September 6th with Dr. Dr Burt Knack.  1. Please arrive at the [facility] (Main Entrance A) at [facility]: [place], [place] [address] at 8 am (two hours before your procedure to ensure your preparation). Free valet parking service is available.   Special note: Every effort is made to have your procedure done on time. Please understand that emergencies sometimes delay scheduled procedures.  2. Diet: Nothing to eat/drink after midnight the night before.  3. Labs: please have blood work at [facility] as  discussed.  4. Medication instructions in preparation for your procedure:  DO NOT TAKE METFORMIN 24 HOURS BEFORE AND 48 HOURS AFTER YOUR PROCEDURE.  You may take 1/2 your normal dose of insulin this AM  You may take all other medications as listed.  5. Plan for one night stay--bring personal belongings. 6. Bring a current list of your medications and current insurance cards. 7. You MUST have a responsible person to drive you home. 8. Someone MUST be with you the first 24 hours after you arrive home or your discharge will be delayed. 9. Please wear clothes that are easy to get on and off and wear slip-on shoes.  Thank you for allowing [place] to care for you!   -- [facility]

## 2017-07-18 LAB — BASIC METABOLIC PANEL
BUN/Creatinine Ratio: 24 (ref 12–28)
BUN: 22 mg/dL (ref 8–27)
CO2: 24 mmol/L (ref 20–29)
Calcium: 9.7 mg/dL (ref 8.7–10.3)
Chloride: 103 mmol/L (ref 96–106)
Creatinine, Ser: 0.93 mg/dL (ref 0.57–1.00)
GFR calc Af Amer: 71 mL/min/{1.73_m2} (ref >59)
GFR, EST NON AFRICAN AMERICAN: 62 mL/min/{1.73_m2} (ref >59)
GLUCOSE: 203 mg/dL — AB (ref 65–99)
POTASSIUM: 4.3 mmol/L (ref 3.5–5.2)
SODIUM: 143 mmol/L (ref 134–144)

## 2017-07-18 LAB — PROTIME-INR
INR: 1 (ref 0.8–1.2)
Prothrombin Time: 10.7 s (ref 9.1–12.0)

## 2017-07-18 LAB — CBC
Hematocrit: 37.7 % (ref 34.0–46.6)
Hemoglobin: 12.3 g/dL (ref 11.1–15.9)
MCH: 29.4 pg (ref 26.6–33.0)
MCHC: 32.6 g/dL (ref 31.5–35.7)
MCV: 90 fL (ref 79–97)
Platelets: 278 10*3/uL (ref 150–379)
RBC: 4.19 x10E6/uL (ref 3.77–5.28)
RDW: 13.6 % (ref 12.3–15.4)
WBC: 11.8 10*3/uL — AB (ref 3.4–10.8)

## 2017-07-24 ENCOUNTER — Telehealth: Payer: Self-pay

## 2017-07-24 NOTE — Telephone Encounter (Signed)
Patient contacted pre-catheterization at [facility] scheduled for:  07/25/2017 @ 1030 Verified arrival time and place:  [facility] @ 0800 Confirmed AM meds to be taken pre-cath with sip of water: Take ASA Hold HCTZ, hold all insulin day of procedure Night prior to cath take 1/2 amount Lantus Hold Metformin after Tues evening dose-confirmed with Pt Confirmed patient has responsible person to drive home post procedure and observe patient for 24 hours:  yes Addl concerns:  None noted

## 2017-07-25 ENCOUNTER — Encounter (HOSPITAL_COMMUNITY)
Admission: RE | Disposition: A | Discharge status: Home / Self Care | Payer: Self-pay | Source: Ambulatory Visit | Attending: Cardiovascular Disease

## 2017-07-25 ENCOUNTER — Ambulatory Visit (HOSPITAL_COMMUNITY)
Admission: RE | Admit: 2017-07-25 | Discharge: 2017-07-25 | Disposition: A | Discharge status: Home / Self Care | Payer: Medicare Other | Source: Ambulatory Visit | Attending: Cardiovascular Disease | Admitting: Cardiovascular Disease

## 2017-07-25 DIAGNOSIS — K219 Gastro-esophageal reflux disease without esophagitis: Secondary | ICD-10-CM | POA: Diagnosis not present

## 2017-07-25 DIAGNOSIS — I350 Nonrheumatic aortic (valve) stenosis: Secondary | ICD-10-CM | POA: Insufficient documentation

## 2017-07-25 DIAGNOSIS — I10 Essential (primary) hypertension: Secondary | ICD-10-CM | POA: Diagnosis not present

## 2017-07-25 DIAGNOSIS — Z6836 Body mass index (BMI) 36.0-36.9, adult: Secondary | ICD-10-CM | POA: Insufficient documentation

## 2017-07-25 DIAGNOSIS — M810 Age-related osteoporosis without current pathological fracture: Secondary | ICD-10-CM | POA: Diagnosis not present

## 2017-07-25 DIAGNOSIS — Z882 Allergy status to sulfonamides status: Secondary | ICD-10-CM | POA: Diagnosis not present

## 2017-07-25 DIAGNOSIS — E119 Type 2 diabetes mellitus without complications: Secondary | ICD-10-CM | POA: Insufficient documentation

## 2017-07-25 DIAGNOSIS — Z794 Long term (current) use of insulin: Secondary | ICD-10-CM | POA: Diagnosis not present

## 2017-07-25 DIAGNOSIS — Z7982 Long term (current) use of aspirin: Secondary | ICD-10-CM | POA: Diagnosis not present

## 2017-07-25 DIAGNOSIS — R000 Tachycardia, unspecified: Secondary | ICD-10-CM | POA: Diagnosis not present

## 2017-07-25 DIAGNOSIS — E663 Overweight: Secondary | ICD-10-CM | POA: Insufficient documentation

## 2017-07-25 LAB — POCT I-STAT 3, VENOUS BLOOD GAS (G3P V)
ACID-BASE EXCESS: 3 mmol/L — AB (ref 0.0–2.0)
BICARBONATE: 27.8 mmol/L (ref 20.0–28.0)
O2 SAT: 75 %
PO2 VEN: 40 mmHg (ref 32.0–45.0)
TCO2: 29 mmol/L (ref 22–32)
pCO2, Ven: 43.3 mmHg — ABNORMAL LOW (ref 44.0–60.0)
pH, Ven: 7.415 (ref 7.250–7.430)

## 2017-07-25 LAB — GLUCOSE, CAPILLARY
Glucose-Capillary: 181 mg/dL — ABNORMAL HIGH (ref 65–99)
Glucose-Capillary: 142 mg/dL — ABNORMAL HIGH (ref 65–99)

## 2017-07-25 SURGERY — RIGHT HEART CATH AND CORONARY ANGIOGRAPHY
Anesthesia: LOCAL

## 2017-07-25 MED ORDER — MIDAZOLAM HCL 2 MG/2ML IJ SOLN
INTRAMUSCULAR | Status: DC | PRN
  Administered 2017-07-25: 2 mg via INTRAVENOUS
  Administered 2017-07-25: 1 mg via INTRAVENOUS

## 2017-07-25 MED ORDER — SODIUM CHLORIDE 0.9% FLUSH: 3.0000 mL | Freq: Two times a day (BID) | INTRAVENOUS | Status: DC

## 2017-07-25 MED ORDER — MIDAZOLAM HCL 2 MG/2ML IJ SOLN
INTRAMUSCULAR | Status: AC
  Filled 2017-07-25: qty 2

## 2017-07-25 MED ORDER — HEPARIN (PORCINE) IN NACL 2-0.9 UNIT/ML-% IJ SOLN
INTRAMUSCULAR | Status: DC | PRN
  Administered 2017-07-25: 12:00:00

## 2017-07-25 MED ORDER — SODIUM CHLORIDE 0.9 % IV SOLN: 250.0000 mL | INTRAVENOUS | Status: DC | PRN

## 2017-07-25 MED ORDER — IOPAMIDOL (ISOVUE-370) INJECTION 76%
INTRAVENOUS | Status: AC
  Filled 2017-07-25: qty 100

## 2017-07-25 MED ORDER — SODIUM CHLORIDE 0.9 % WEIGHT BASED INFUSION
3.0000 mL/kg/h | INTRAVENOUS | Status: AC
  Administered 2017-07-25: 3 mL/kg/h via INTRAVENOUS

## 2017-07-25 MED ORDER — ONDANSETRON HCL 4 MG/2ML IJ SOLN
4.0000 mg | Freq: Four times a day (QID) | INTRAMUSCULAR | Status: DC | PRN
Start: 2017-07-25 — End: 2017-07-25

## 2017-07-25 MED ORDER — SODIUM CHLORIDE 0.9 % WEIGHT BASED INFUSION: 1.0000 mL/kg/h | INTRAVENOUS | Status: DC

## 2017-07-25 MED ORDER — ASPIRIN 81 MG PO CHEW: 81.0000 mg | CHEWABLE_TABLET | ORAL | Status: DC

## 2017-07-25 MED ORDER — HEPARIN SODIUM (PORCINE) 1000 UNIT/ML IJ SOLN
INTRAMUSCULAR | Status: DC | PRN
  Administered 2017-07-25: 5000 [IU] via INTRAVENOUS

## 2017-07-25 MED ORDER — FENTANYL CITRATE (PF) 100 MCG/2ML IJ SOLN
INTRAMUSCULAR | Status: DC | PRN
  Administered 2017-07-25 (×2): 25 ug via INTRAVENOUS

## 2017-07-25 MED ORDER — IOPAMIDOL (ISOVUE-370) INJECTION 76%
INTRAVENOUS | Status: DC | PRN
  Administered 2017-07-25: 45 mL

## 2017-07-25 MED ORDER — FENTANYL CITRATE (PF) 100 MCG/2ML IJ SOLN
INTRAMUSCULAR | Status: AC
  Filled 2017-07-25: qty 2

## 2017-07-25 MED ORDER — HEPARIN SODIUM (PORCINE) 1000 UNIT/ML IJ SOLN
INTRAMUSCULAR | Status: AC
  Filled 2017-07-25: qty 1

## 2017-07-25 MED ORDER — VERAPAMIL HCL 2.5 MG/ML IV SOLN
INTRAVENOUS | Status: DC | PRN
  Administered 2017-07-25: 10 mL via INTRA_ARTERIAL

## 2017-07-25 MED ORDER — SODIUM CHLORIDE 0.9% FLUSH: 3.0000 mL | INTRAVENOUS | Status: DC | PRN

## 2017-07-25 MED ORDER — HEPARIN (PORCINE) IN NACL 2-0.9 UNIT/ML-% IJ SOLN
INTRAMUSCULAR | Status: AC
  Filled 2017-07-25: qty 1000

## 2017-07-25 MED ORDER — ACETAMINOPHEN 325 MG PO TABS: 650.0000 mg | ORAL_TABLET | ORAL | Status: DC | PRN

## 2017-07-25 MED ORDER — LIDOCAINE HCL (PF) 1 % IJ SOLN
INTRAMUSCULAR | Status: DC | PRN
  Administered 2017-07-25 (×2): 2 mL

## 2017-07-25 MED ORDER — LIDOCAINE HCL (PF) 1 % IJ SOLN
INTRAMUSCULAR | Status: AC
  Filled 2017-07-25: qty 60

## 2017-07-25 MED ORDER — VERAPAMIL HCL 2.5 MG/ML IV SOLN
INTRAVENOUS | Status: AC
  Filled 2017-07-25: qty 2

## 2017-07-25 SURGICAL SUPPLY — 11 items

## 2017-07-25 NOTE — Interval H&P Note (Signed)
History and Physical Interval Note:  07/25/2017 11:15 AM  Toni Cooper  has presented today for surgery, with the diagnosis of arotic stenosis  The various methods of treatment have been discussed with the patient and family. After consideration of risks, benefits and other options for treatment, the patient has consented to  Procedure(s): RIGHT/LEFT HEART CATH AND CORONARY ANGIOGRAPHY (N/A) as a surgical intervention .  The patient's history has been reviewed, patient examined, no change in status, stable for surgery.  I have reviewed the patient's chart and labs.  Questions were answered to the patient's satisfaction.     Sherren Mocha

## 2017-07-25 NOTE — H&P (View-Only) (Signed)
HPI The patient presents for follow up of severe AS on echo.  The recent echo again suggests severe AS.  This has been slowly progressive.  Her last meeting gradient was 59. Velocity was 4.9 m/s.  She has at least a class IIa indication for aortic valve replacement.  She comes back to talk about further work up prior to elective valve surgery.  She had had continued dyspnea walking up an incline.  She has recently had two episodes of her heart racing.  One was on an exercise bike but faster than she thought it would be and one was at rest.  They lasted about 5 minutes.  She did not have worse SOB or pain with this.  She has a vague chest pressure only at night.  The patient denies any new symptoms such neck or arm discomfort. There has been no new PND or orthopnea. There has been no presyncope or syncope.   Allergies  Allergen Reactions  . Sulfa Antibiotics Nausea And Vomiting    Current Outpatient Prescriptions  Medication Sig Dispense Refill  . alendronate (FOSAMAX) 70 MG tablet Take 1 tablet (70 mg total) by mouth every 7 (seven) days. Take with a full glass of water on an empty stomach. 12 tablet 3  . aspirin EC 81 MG tablet Take 81 mg by mouth daily.    [name] atorvastatin (LIPITOR) 10 MG tablet TAKE 1/2 TABLET BY MOUTH  DAILY AT 6PM 45 tablet 1  . beta carotene w/minerals (OCUVITE) tablet Take 1 tablet by mouth daily.    . calcium-vitamin D (OSCAL 500/200 D-3) 500-200 MG-UNIT tablet Take 1 tablet by mouth 2 (two) times daily.    [name] CARTIA XT 120 MG 24 hr capsule TAKE 1 CAPSULE BY MOUTH  DAILY 90 capsule 1  . cholecalciferol (VITAMIN D) 1000 UNITS tablet Take 1,000 Units by mouth daily.    [name] HUMALOG 100 UNIT/ML injection 3 (three) times daily with meals. Using sliding scale.    . hydrochlorothiazide (HYDRODIURIL) 25 MG tablet TAKE 1 TABLET BY MOUTH  DAILY 90 tablet 1  . LANTUS 100 UNIT/ML injection Inject 35 Units into the skin at bedtime.     [name] lisinopril (PRINIVIL,ZESTRIL) 40 MG tablet  TAKE 1 TABLET BY MOUTH  DAILY 90 tablet 1  . Magnesium 250 MG TABS Take 1 tablet by mouth daily.    . metFORMIN (GLUCOPHAGE-XR) 500 MG 24 hr tablet Take 500 mg by mouth 2 (two) times daily.     . Multiple Vitamin (MULTIVITAMIN) capsule Take 1 capsule by mouth daily.    . Omega 3 1000 MG CAPS Take 1,000 mg by mouth daily.    [name] pyridOXINE (VITAMIN B-6) 100 MG tablet Take 100 mg by mouth daily.    . vitamin C (ASCORBIC ACID) 500 MG tablet Take 500 mg by mouth daily.    [name] TEST test strip Test four times daily.     No current facility-administered medications for this visit.     Past Medical History:  Diagnosis Date  . Aortic stenosis, severe 2015  . Cataract   . Diabetes mellitus without complication ([place])   . Hypertension   . Osteoporosis     Past Surgical History:  Procedure Laterality Date  . ABDOMINAL HYSTERECTOMY    . APPENDECTOMY    . FRACTURE SURGERY     shoulder  . TEE WITHOUT CARDIOVERSION N/A 04/18/2016   Procedure: TRANSESOPHAGEAL ECHOCARDIOGRAM (TEE);  Surgeon: Thayer Headings, MD;  Location: [facility]  ENDOSCOPY;  Service: Cardiovascular;  Laterality: N/A;   ROS:  Positive for GERD.   Otherwise as stated in the HPI and negative for all other systems.    PHYSICAL EXAM BP 122/60   Pulse 76   Ht 5\' 4"  (1.626 m)   Wt 212 lb (96.2 kg)   BMI 36.39 kg/m   GENERAL:  Well appearing NECK:  No jugular venous distention, waveform within normal limits, carotid upstroke brisk and symmetric, no bruits, no thyromegaly LUNGS:  Clear to auscultation bilaterally CHEST:  Unremarkable HEART:  PMI not displaced or sustained,S1 and S2 within normal limits, no S3, no S4, no clicks, no rubs, 3/6 apical systolic murmur radiating out the outflow tract, no diastolic murmurs ABD:  Flat, positive bowel sounds normal in frequency in pitch, no bruits, no rebound, no guarding, no midline pulsatile mass, no hepatomegaly, no splenomegaly EXT:  2 plus pulses throughout, no edema, no cyanosis no  clubbing   EKG:  NSR, rate 75, axis WNL intervals WNL, no acute ST T wave changes.    ASSESSMENT AND PLAN  AORTIC STENOSIS:    She has a class IIa indication for valve replacement.  I think she is starting to get some symptoms.  I am going to send her for right and left heart cath.  The patient understands that risks included but are not limited to stroke (1 in 1000), death (1 in 51), kidney failure [usually temporary] (1 in 500), bleeding (1 in 200), allergic reaction [possibly serious] (1 in 200).  The patient understands and agrees to proceed.   OVERWEIGHT:  She understands the reasons to lose weight with diet and exercise.   DM:  A1C 7.1.   No change in therapy.  This is followed by [name], FNP  HTN:  The blood pressure is WNL.  No change in therapy.   TACHYCARDIA:  I don't know the mechanism .  It is not frequent enough yet to even suggest event monitor.  I asked her to consider an Alive Cor

## 2017-07-25 NOTE — Discharge Instructions (Signed)

## 2017-08-21 NOTE — Progress Notes (Signed)
HPI The patient presents for follow up of severe AS on echo.  The recent echo again suggests severe AS.  This has been slowly progressive.  Her last mean gradient was 59. Velocity was 4.9 m/s.  She has an indication for aortic valve replacement.  Cardiac cath demonstrated NL coronary arteries.  She presents today to discuss the results.  She has no new cardiovascular complaints.  The patient denies any new symptoms such as chest discomfort, neck or arm discomfort. There has been no new shortness of breath, PND or orthopnea. There have been no reported palpitations, presyncope or syncope.  Her arm was bruised after the procedure .   Allergies  Allergen Reactions  . Sulfa Antibiotics Nausea And Vomiting    Current Outpatient Prescriptions  Medication Sig Dispense Refill  . acetaminophen (TYLENOL) 500 MG tablet Take 500-1,000 mg by mouth daily as needed for moderate pain or headache.    . alendronate (FOSAMAX) 70 MG tablet Take 1 tablet (70 mg total) by mouth every 7 (seven) days. Take with a full glass of water on an empty stomach. (Patient taking differently: Take 70 mg by mouth every Saturday. Take with a full glass of water on an empty stomach.) 12 tablet 3  . aspirin EC 81 MG tablet Take 81 mg by mouth daily.    [name] atorvastatin (LIPITOR) 10 MG tablet TAKE 1/2 TABLET BY MOUTH  DAILY AT 6PM 45 tablet 1  . beta carotene w/minerals (OCUVITE) tablet Take 1 tablet by mouth daily.    . calcium carbonate (TUMS - DOSED IN MG ELEMENTAL CALCIUM) 500 MG chewable tablet Chew 1 tablet by mouth daily as needed for indigestion or heartburn.    . calcium-vitamin D (OSCAL 500/200 D-3) 500-200 MG-UNIT tablet Take 1 tablet by mouth 2 (two) times daily.    [name] CARTIA XT 120 MG 24 hr capsule TAKE 1 CAPSULE BY MOUTH  DAILY 90 capsule 1  . cholecalciferol (VITAMIN D) 1000 UNITS tablet Take 1,000 Units by mouth daily.    . fluticasone (FLONASE) 50 MCG/ACT nasal spray Place 1 spray into both nostrils daily as  needed for allergies or rhinitis.    [name] HUMALOG 100 UNIT/ML injection Inject 6-20 Units into the skin 3 (three) times daily with meals. Using sliding scale.    . hydrochlorothiazide (HYDRODIURIL) 25 MG tablet TAKE 1 TABLET BY MOUTH  DAILY 90 tablet 1  . ibuprofen (ADVIL,MOTRIN) 200 MG tablet Take 200-600 mg by mouth daily as needed for headache or moderate pain.    [name] LANTUS 100 UNIT/ML injection Inject 35 Units into the skin at bedtime.     [name] lisinopril (PRINIVIL,ZESTRIL) 40 MG tablet TAKE 1 TABLET BY MOUTH  DAILY 90 tablet 1  . Magnesium 250 MG TABS Take 250 mg by mouth daily.     . metFORMIN (GLUCOPHAGE-XR) 500 MG 24 hr tablet Take 500 mg by mouth 2 (two) times daily.     . Multiple Vitamin (MULTIVITAMIN) capsule Take 1 capsule by mouth daily.    . Omega 3 1000 MG CAPS Take 1,000 mg by mouth daily.    [name] pyridOXINE (VITAMIN B-6) 100 MG tablet Take 100 mg by mouth daily.    Toni Cooper TEST test strip Test four times daily.    . vitamin C (ASCORBIC ACID) 500 MG tablet Take 500 mg by mouth daily.     No current facility-administered medications for this visit.     Past Medical History:  Diagnosis Date  .  Aortic stenosis, severe 2015  . Cataract   . Diabetes mellitus without complication ([place])   . Hypertension   . Osteoporosis     Past Surgical History:  Procedure Laterality Date  . ABDOMINAL HYSTERECTOMY    . APPENDECTOMY    . FRACTURE SURGERY     shoulder  . TEE WITHOUT CARDIOVERSION N/A 04/18/2016   Procedure: TRANSESOPHAGEAL ECHOCARDIOGRAM (TEE);  Surgeon: Thayer Headings, MD;  Location: [facility] ENDOSCOPY;  Service: Cardiovascular;  Laterality: N/A;    ROS:  As stated in the HPI and negative for all other systems.   PHYSICAL EXAM BP 128/65   Pulse 75   Ht 5\' 4"  (1.626 m)   Wt 212 lb 3.2 oz (96.3 kg)   BMI 36.42 kg/m   GENERAL:  Well appearing NECK:  No jugular venous distention, waveform within normal limits, carotid upstroke brisk and symmetric, no bruits, no  thyromegaly LUNGS:  Clear to auscultation bilaterally CHEST:  Unremarkable HEART:  PMI not displaced or sustained,S1 and S2 within normal limits, no S3, no S4, no clicks, no rubs, 3/6 late peaking systolic murmur at the apex and radiating out the outflow tract, no diastolic murmurs ABD:  Flat, positive bowel sounds normal in frequency in pitch, no bruits, no rebound, no guarding, no midline pulsatile mass, no hepatomegaly, no splenomegaly EXT:  2 plus pulses throughout, trace ankle edema, no cyanosis no clubbing   ASSESSMENT AND PLAN  AORTIC STENOSIS:   This was severe by echo.  She does have some symptoms now (Stage D).  Her STS mortality risk is 3.86.  I will send her to see Dr. [name] and I spoke with him about this.  He will further evaluate her for SAVR vs TAVR.  No change in therapy is indicated.   OVERWEIGHT:  We have talked about the need to lose weight in the past.  She has not been particularly successful with this.   DM:  A1C 7.1. This is managed by Toni Pretty, FNP   HTN:  The blood pressure is WNL.  No change in therapy.   TACHYCARDIA:  She has had no further palpitations which she described previously.  n Alive Cor

## 2017-08-22 ENCOUNTER — Encounter: Payer: Self-pay | Admitting: Cardiology

## 2017-08-22 ENCOUNTER — Ambulatory Visit (INDEPENDENT_AMBULATORY_CARE_PROVIDER_SITE_OTHER): Payer: Medicare Other | Admitting: Cardiology

## 2017-08-22 VITALS — BP 128/65 | HR 75 | Ht 64.0 in | Wt 212.2 lb

## 2017-08-22 DIAGNOSIS — I350 Nonrheumatic aortic (valve) stenosis: Secondary | ICD-10-CM | POA: Diagnosis not present

## 2017-08-22 NOTE — Patient Instructions (Signed)
Medication Instructions:  Continue current medications  If you need a refill on your cardiac medications before your next appointment, please call your pharmacy.  Labwork: None Ordered HERE IN OUR OFFICE AT [facility]  Testing/Procedures: None Ordered  Follow-Up: You have been referred to Dr Darylene Price   Thank you for choosing [facility] at [facility]!!

## 2017-08-23 ENCOUNTER — Encounter: Payer: Self-pay | Admitting: Cardiology

## 2017-08-28 ENCOUNTER — Other Ambulatory Visit: Payer: Self-pay

## 2017-08-28 DIAGNOSIS — I350 Nonrheumatic aortic (valve) stenosis: Secondary | ICD-10-CM

## 2017-09-06 ENCOUNTER — Encounter (HOSPITAL_COMMUNITY): Payer: Self-pay

## 2017-09-06 ENCOUNTER — Ambulatory Visit (HOSPITAL_COMMUNITY)
Admission: RE | Admit: 2017-09-06 | Discharge: 2017-09-06 | Disposition: A | Discharge status: Home / Self Care | Payer: Medicare Other | Source: Ambulatory Visit | Attending: Cardiovascular Disease | Admitting: Cardiovascular Disease

## 2017-09-06 DIAGNOSIS — I350 Nonrheumatic aortic (valve) stenosis: Secondary | ICD-10-CM

## 2017-09-06 DIAGNOSIS — K449 Diaphragmatic hernia without obstruction or gangrene: Secondary | ICD-10-CM | POA: Diagnosis not present

## 2017-09-06 DIAGNOSIS — I700 Atherosclerosis of aorta: Secondary | ICD-10-CM | POA: Insufficient documentation

## 2017-09-06 DIAGNOSIS — K8689 Other specified diseases of pancreas: Secondary | ICD-10-CM | POA: Insufficient documentation

## 2017-09-06 LAB — POCT I-STAT CREATININE: CREATININE: 1 mg/dL (ref 0.44–1.00)

## 2017-09-06 MED ORDER — IOPAMIDOL (ISOVUE-370) INJECTION 76%
INTRAVENOUS | Status: AC
  Administered 2017-09-06: 80 mL
  Filled 2017-09-06: qty 100

## 2017-09-10 ENCOUNTER — Other Ambulatory Visit: Payer: Self-pay | Admitting: Nurse Practitioner

## 2017-09-10 DIAGNOSIS — E782 Mixed hyperlipidemia: Secondary | ICD-10-CM

## 2017-09-10 DIAGNOSIS — I350 Nonrheumatic aortic (valve) stenosis: Secondary | ICD-10-CM

## 2017-09-10 DIAGNOSIS — I10 Essential (primary) hypertension: Secondary | ICD-10-CM

## 2017-09-16 ENCOUNTER — Encounter: Payer: Self-pay | Admitting: Thoracic Surgery (Cardiothoracic Vascular Surgery)

## 2017-09-16 ENCOUNTER — Other Ambulatory Visit: Payer: Self-pay

## 2017-09-16 ENCOUNTER — Institutional Professional Consult (permissible substitution) (INDEPENDENT_AMBULATORY_CARE_PROVIDER_SITE_OTHER): Payer: Medicare Other | Admitting: Thoracic Surgery (Cardiothoracic Vascular Surgery)

## 2017-09-16 VITALS — BP 139/76 | HR 76 | Resp 16 | Ht 64.0 in | Wt 212.0 lb

## 2017-09-16 DIAGNOSIS — I350 Nonrheumatic aortic (valve) stenosis: Secondary | ICD-10-CM

## 2017-09-16 NOTE — Progress Notes (Signed)
[facility]  MULTIDISCIPLINARY HEART VALVE CLINIC  CARDIOTHORACIC SURGERY CONSULTATION REPORT  Referring Provider is [name], MD PCP is [name], FNP  Chief Complaint  Patient presents with  . Aortic Stenosis    Surgical eval for TAVR vs AVR, review all studies    HPI:  Patient is a 71 year old moderately obese female with history of aortic stenosis, hypertension, insulin-dependent type 2 diabetes mellitus without complications, and osteoporosis with been referred for surgical consultation to discuss treatment options for management of severe symptomatic aortic stenosis. The patient states that she has known about the presence of a heart murmur for several years. Transthoracic echocardiograms have demonstrated the presence of aortic stenosis, and she has been followed by Dr. Percival Spanish for several years. Over the past 2-3 years the patient has developed symptoms of exertional shortness of breath, decreased exercise tolerance, and a tendency to "give out". Follow-up echocardiogram performed 04/09/2017 revealed significant progression in the severity of aortic stenosis with peak velocity across the aortic valve measured 4.9 m/s corresponding to mean transvalvular gradient estimated 59 mmHg and the DVI only 0.14. Left ventricular systolic function remained normal with ejection fraction estimated 60-65%.  The patient was seen in follow-up by Dr. Percival Spanish on 07/17/2017 and scheduled for left and right heart catheterization that was performed by Dr. Burt Knack on 07/25/2017.  Catheterization revealed normal coronary arteries with normal right-sided heart pressures and left ventricular end-diastolic pressure.  CT angiography was performed and the patient was subsequently referred for elective surgical consultation.  Patient is married and lives with her husband in [place].  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 She has remained reasonably active and functionally independent. She complains that over the past 2-3 years she has developed worsening exercise tolerance, increased fatigue, exertional shortness of breath, and a tendency to "give out". She states that with ordinary activities she feels well but she is not able to keep up with chores as easily as he used to in the past. She denies any exertional chest pain or chest tightness. She denies any shortness of breath with low-level activities around the house. She has never had any resting shortness of breath, PND, orthopnea, or lower extremity edema.  Her mobility is not limited to any significant degree.  She states that her diabetes has been under good control and she checks her blood sugars regularly at home.  Past Medical History:  Diagnosis Date  . Aortic stenosis, severe 2015  . Cataract   . Diabetes mellitus without complication ([place])   . Hypertension   . Osteoporosis     Past Surgical History:  Procedure Laterality Date  . ABDOMINAL HYSTERECTOMY    . APPENDECTOMY    . FRACTURE SURGERY     shoulder  . TEE WITHOUT CARDIOVERSION N/A 04/18/2016   Procedure: TRANSESOPHAGEAL ECHOCARDIOGRAM (TEE);  Surgeon: Thayer Headings, MD;  Location: [facility] ENDOSCOPY;  Service: Cardiovascular;  Laterality: N/A;    Family History  Problem Relation Age of Onset  . Stroke Father 25  . Diabetes Father   . Retinopathy of prematurity Father   . Heart failure Mother   . Heart attack Maternal Grandmother   . Heart attack Paternal Grandmother   . Diabetes Paternal Aunt   . Diabetes Paternal Uncle     Social History   Social History  . Marital status: Married    Spouse name: N/A  . Number of children: N/A  .  Years of education: N/A   Occupational History  . Not on file.   Social History Main Topics  . Smoking status: Never Smoker  . Smokeless tobacco: Never Used  . Alcohol use No  . Drug use: No  . Sexual activity: Not on file    Other Topics Concern  . Not on file   Social History Narrative   Lives at home with husband wife.      Current Outpatient Prescriptions  Medication Sig Dispense Refill  . acetaminophen (TYLENOL) 500 MG tablet Take 500-1,000 mg by mouth daily as needed for moderate pain or headache.    . alendronate (FOSAMAX) 70 MG tablet Take 1 tablet (70 mg total) by mouth every 7 (seven) days. Take with a full glass of water on an empty stomach. (Patient taking differently: Take 70 mg by mouth every Saturday. Take with a full glass of water on an empty stomach.) 12 tablet 3  . aspirin EC 81 MG tablet Take 81 mg by mouth daily.    [name] atorvastatin (LIPITOR) 10 MG tablet TAKE ONE-HALF TABLET BY  MOUTH DAILY AT 6PM 45 tablet 0  . beta carotene w/minerals (OCUVITE) tablet Take 1 tablet by mouth daily.    . calcium carbonate (TUMS - DOSED IN MG ELEMENTAL CALCIUM) 500 MG chewable tablet Chew 1 tablet by mouth daily as needed for indigestion or heartburn.    . calcium-vitamin D (OSCAL 500/200 D-3) 500-200 MG-UNIT tablet Take 1 tablet by mouth 2 (two) times daily.    . cholecalciferol (VITAMIN D) 1000 UNITS tablet Take 1,000 Units by mouth daily.    [name] diltiazem (CARDIZEM CD) 120 MG 24 hr capsule TAKE 1 CAPSULE BY MOUTH  DAILY 90 capsule 0  . fluticasone (FLONASE) 50 MCG/ACT nasal spray Place 1 spray into both nostrils daily as needed for allergies or rhinitis.    [name] HUMALOG 100 UNIT/ML injection Inject 6-20 Units into the skin 3 (three) times daily with meals. Using sliding scale.    . hydrochlorothiazide (HYDRODIURIL) 25 MG tablet TAKE 1 TABLET BY MOUTH  DAILY 90 tablet 0  . ibuprofen (ADVIL,MOTRIN) 200 MG tablet Take 200-600 mg by mouth daily as needed for headache or moderate pain.    [name] LANTUS 100 UNIT/ML injection Inject 35 Units into the skin at bedtime.     [name] lisinopril (PRINIVIL,ZESTRIL) 40 MG tablet TAKE 1 TABLET BY MOUTH  DAILY 90 tablet 0  . Magnesium 250 MG TABS Take 250 mg by mouth daily.     .  metFORMIN (GLUCOPHAGE-XR) 500 MG 24 hr tablet Take 500 mg by mouth 2 (two) times daily.     . Multiple Vitamin (MULTIVITAMIN) capsule Take 1 capsule by mouth daily.    . Omega 3 1000 MG CAPS Take 1,000 mg by mouth daily.    [name] pyridOXINE (VITAMIN B-6) 100 MG tablet Take 100 mg by mouth daily.    [name] TEST test strip Test four times daily.    . vitamin C (ASCORBIC ACID) 500 MG tablet Take 500 mg by mouth daily.     No current facility-administered medications for this visit.     Allergies  Allergen Reactions  . Sulfa Antibiotics Nausea And Vomiting      Review of Systems:   General:  normal appetite, decreased energy, no weight gain, no weight loss, no fever  Cardiac:  no chest pain with exertion, no chest pain at rest, + SOB with exertion, no resting SOB, no PND, no orthopnea, no palpitations,  no arrhythmia, no atrial fibrillation, no LE edema, no dizzy spells, no syncope  Respiratory:  + exertional shortness of breath, no home oxygen, no productive cough, no dry cough, no bronchitis, no wheezing, no hemoptysis, no asthma, no pain with inspiration or cough, no sleep apnea, no CPAP at night  GI:   no difficulty swallowing, no reflux, no frequent heartburn, no hiatal hernia, no abdominal pain, no constipation, no diarrhea, no hematochezia, no hematemesis, no melena  GU:   no dysuria,  no frequency, no urinary tract infection, no hematuria, no kidney stones, no kidney disease  Vascular:  no pain suggestive of claudication, no pain in feet, no leg cramps, + varicose veins, no DVT, no non-healing foot ulcer  Neuro:   no stroke, no TIA's, no seizures, no headaches, no temporary blindness one eye,  no slurred speech, no peripheral neuropathy, no chronic pain, no instability of gait, no memory/cognitive dysfunction  Musculoskeletal: + arthritis, no joint swelling, no myalgias, no difficulty walking, normal mobility   Skin:   no rash, no itching, no skin infections, no pressure sores or  ulcerations  Psych:   no anxiety, no depression, no nervousness, no unusual recent stress  Eyes:   no blurry vision, no floaters, no recent vision changes, + wears glasses or contacts  ENT:   no hearing loss, no loose or painful teeth, no dentures, last saw dentist 04/08/2017  Hematologic:  no easy bruising, no abnormal bleeding, no clotting disorder, no frequent epistaxis  Endocrine:  + diabetes, does check CBG's at home           Physical Exam:   BP 139/76 (BP Location: Right Arm, Patient Position: Sitting, Cuff Size: Large)   Pulse 76   Resp 16   Ht 5\' 4"  (1.626 m)   Wt 212 lb (96.2 kg)   SpO2 96% Comment: RA  BMI 36.39 kg/m   General:  Moderately obese,  well-appearing  HEENT:  Unremarkable   Neck:   no JVD, no bruits, no adenopathy   Chest:   clear to auscultation, symmetrical breath sounds, no wheezes, no rhonchi   CV:   RRR, grade IV/VI crescendo/decrescendo murmur heard best at RUSB,  no diastolic murmur  Abdomen:  soft, non-tender, no masses   Extremities:  warm, well-perfused, pulses diminished, no LE edema  Rectal/GU  Deferred  Neuro:   Grossly non-focal and symmetrical throughout  Skin:   Clean and dry, no rashes, no breakdown   Diagnostic Tests:  Transthoracic Echocardiography  (Report amended )  Patient:    Kenasia, Scheller MR #:       [id number] Study Date: 04/09/2017 Gender:     F Age:        41 Height:     162.6 cm Weight:     96.6 kg BSA:        2.13 m^2 Pt. Status: Room:   ORDERING     [name], MD  REFERRING    [name], MD  SONOGRAPHER  Blondell Reveal  [place], [place]  PERFORMING   [facility], Outpatient  ATTENDING    Skeet Latch, MD  cc:  ------------------------------------------------------------------- LV EF: 60% -   65%  ------------------------------------------------------------------- Indications:       (I35.0).  ------------------------------------------------------------------- History:   PMH:  Acquired from the patient and from the patient&'s chart.  Dyspnea.  Severe aortic stenosis.  Risk factors: Hypertension. Diabetes mellitus. Obese.  ------------------------------------------------------------------- Study Conclusions  - Left ventricle: The cavity size  was normal. There was moderate   concentric hypertrophy. Systolic function was normal. The   estimated ejection fraction was in the range of 60% to 65%. Wall   motion was normal; there were no regional wall motion   abnormalities. Doppler parameters are consistent with abnormal   left ventricular relaxation (grade 1 diastolic dysfunction).   Doppler parameters are consistent with high ventricular filling   pressure. - Aortic valve: Valve mobility was restricted. There was critical   stenosis. There was no regurgitation. Peak velocity (S): 491   cm/s. Mean gradient (S): 59 mm Hg. - Mitral valve: Transvalvular velocity was within the normal range.   There was no evidence for stenosis. There was trivial   regurgitation. - Left atrium: The atrium was mildly dilated. - Right ventricle: The cavity size was normal. Wall thickness was   normal. Systolic function was normal. - Tricuspid valve: There was trivial regurgitation. - Pulmonary arteries: Systolic pressure was within the normal   range. PA peak pressure: 27 mm Hg (S).  ------------------------------------------------------------------- Study data:  Comparison was made to the study of 01/24/2016.  Study status:  Routine.  Procedure:  The patient reported no pain pre or post test. Transthoracic echocardiography for left ventricular function evaluation and for assessment of valvular function. Image quality was adequate.  Study completion:  There were no complications.          Transthoracic echocardiography.  M-mode, complete 2D, spectral Doppler, and color Doppler.   Birthdate: Patient birthdate: February 28, 1946.  Age:  Patient is 71 yr old.  Sex: Gender: female.    BMI: 36.5 kg/m^2.  Blood pressure:     124/62 Patient status:  Outpatient.  Study date:  Study date: 04/09/2017. Study time: 10:36 AM.  Location:  [facility]  -------------------------------------------------------------------  ------------------------------------------------------------------- Left ventricle:  The cavity size was normal. There was moderate concentric hypertrophy. Systolic function was normal. The estimated ejection fraction was in the range of 60% to 65%. Wall motion was normal; there were no regional wall motion abnormalities. Doppler parameters are consistent with abnormal left ventricular relaxation (grade 1 diastolic dysfunction). Doppler parameters are consistent with high ventricular filling pressure.  ------------------------------------------------------------------- Aortic valve:   Trileaflet; severely thickened, severely calcified leaflets. Valve mobility was restricted.  Doppler:   There was critical stenosis.   There was no regurgitation.    VTI ratio of LVOT to aortic valve: 0.14. Valve area (VTI): 0.47 cm^2. Indexed valve area (VTI): 0.22 cm^2/m^2. Peak velocity ratio of LVOT to aortic valve: 0.17. Valve area (Vmax): 0.58 cm^2. Indexed valve area (Vmax): 0.27 cm^2/m^2. Mean velocity ratio of LVOT to aortic valve: 0.15. Valve area (Vmean): 0.53 cm^2. Indexed valve area (Vmean): 0.25 cm^2/m^2.    Mean gradient (S): 59 mm Hg. Peak gradient (S): 96 mm Hg.  ------------------------------------------------------------------- Aorta:  Aortic root: The aortic root was normal in size.  ------------------------------------------------------------------- Mitral valve:   Structurally normal valve.   Mobility was not restricted.  Doppler:  Transvalvular velocity was within the normal range. There was no evidence for stenosis. There was  trivial regurgitation.    Peak gradient (D): 2 mm Hg.  ------------------------------------------------------------------- Left atrium:  The atrium was mildly dilated.  ------------------------------------------------------------------- Right ventricle:  The cavity size was normal. Wall thickness was normal. Systolic function was normal.  ------------------------------------------------------------------- Pulmonic valve:    Structurally normal valve.   Cusp separation was normal.  Doppler:  Transvalvular velocity was within the normal range. There was no evidence for stenosis. There was no regurgitation.  -------------------------------------------------------------------  Tricuspid valve:   Structurally normal valve.    Doppler: Transvalvular velocity was within the normal range. There was trivial regurgitation.  ------------------------------------------------------------------- Pulmonary artery:   The main pulmonary artery was normal-sized. Systolic pressure was within the normal range.  ------------------------------------------------------------------- Right atrium:  The atrium was normal in size.  ------------------------------------------------------------------- Pericardium:  There was no pericardial effusion.  ------------------------------------------------------------------- Systemic veins: Inferior vena cava: The vessel was normal in size. The respirophasic diameter changes were in the normal range (= 50%), consistent with normal central venous pressure.  ------------------------------------------------------------------- Measurements   Left ventricle                            Value          Reference  LV ID, ED, PLAX chordal                   48.4  mm       43 - 52  LV ID, ES, PLAX chordal                   32.9  mm       23 - 38  LV fx shortening, PLAX chordal            32    %        >=29  LV PW thickness, ED                       13.8  mm        ---------  IVS/LV PW ratio, ED                       1.07           <=1.3  Stroke volume, 2D                         58    ml       ---------  Stroke volume/bsa, 2D                     27    ml/m^2   ---------  LV ejection fraction, 1-p A4C             61    %        ---------  LV end-diastolic volume, 2-p              78    ml       ---------  LV end-systolic volume, 2-p               28    ml       ---------  LV ejection fraction, 2-p                 64    %        ---------  Stroke volume, 2-p                        50    ml       ---------  LV end-diastolic volume/bsa, 2-p          37    ml/m^2   ---------  LV end-systolic volume/bsa, 2-p           13    ml/m^2   ---------  Stroke volume/bsa,  2-p                    23.4  ml/m^2   ---------  LV e&', lateral                            10    cm/s     ---------  LV E/e&', lateral                          7.31           ---------  LV e&', medial                             3.9   cm/s     ---------  LV E/e&', medial                           18.74          ---------  LV e&', average                            6.95  cm/s     ---------  LV E/e&', average                          10.52          ---------    Ventricular septum                        Value          Reference  IVS thickness, ED                         14.7  mm       ---------    LVOT                                      Value          Reference  LVOT ID, S                                21    mm       ---------  LVOT area                                 3.46  cm^2     ---------  LVOT ID                                   21    mm       ---------  LVOT peak velocity, S                     82.7  cm/s     ---------  LVOT mean velocity, S                     55.8  cm/s     ---------  LVOT VTI, S                               16.9  cm       ---------  LVOT peak gradient, S                     3     mm Hg    ---------  Stroke volume (SV), LVOT DP               58.5  ml        ---------  Stroke index (SV/bsa), LVOT DP            27.4  ml/m^2   ---------    Aortic valve                              Value          Reference  Aortic valve peak velocity, S             491   cm/s     ---------  Aortic valve mean velocity, S             362   cm/s     ---------  Aortic valve VTI, S                       125   cm       ---------  Aortic mean gradient, S                   59    mm Hg    ---------  Aortic peak gradient, S                   96    mm Hg    ---------  VTI ratio, LVOT/AV                        0.14           ---------  Aortic valve area, VTI                    0.47  cm^2     ---------  Aortic valve area/bsa, VTI                0.22  cm^2/m^2 ---------  Velocity ratio, peak, LVOT/AV             0.17           ---------  Aortic valve area, peak velocity          0.58  cm^2     ---------  Aortic valve area/bsa, peak               0.27  cm^2/m^2 ---------  velocity  Velocity ratio, mean, LVOT/AV             0.15           ---------  Aortic valve area, mean velocity          0.53  cm^2     ---------  Aortic valve area/bsa, mean               0.25  cm^2/m^2 ---------  velocity    Aorta  Value          Reference  Aortic root ID, ED                        32    mm       ---------  Ascending aorta ID, A-P, S                33    mm       ---------    Left atrium                               Value          Reference  LA ID, A-P, ES                            41    mm       ---------  LA ID/bsa, A-P                            1.92  cm/m^2   <=2.2  LA volume, S                              60    ml       ---------  LA volume/bsa, S                          28.1  ml/m^2   ---------  LA volume, ES, 1-p A4C                    61    ml       ---------  LA volume/bsa, ES, 1-p A4C                28.6  ml/m^2   ---------  LA volume, ES, 1-p A2C                    59    ml       ---------  LA volume/bsa, ES, 1-p A2C                27.7   ml/m^2   ---------    Mitral valve                              Value          Reference  Mitral E-wave peak velocity               73.1  cm/s     ---------  Mitral A-wave peak velocity               116   cm/s     ---------  Mitral deceleration time          (H)     405   ms       150 - 230  Mitral peak gradient, D                   2     mm Hg    ---------  Mitral E/A ratio, peak  0.6            ---------    Pulmonary arteries                        Value          Reference  PA pressure, S, DP                        27    mm Hg    <=30    Tricuspid valve                           Value          Reference  Tricuspid regurg peak velocity            246   cm/s     ---------  Tricuspid peak RV-RA gradient             24    mm Hg    ---------    Systemic veins                            Value          Reference  Estimated CVP                             3     mm Hg    ---------    Right ventricle                           Value          Reference  RV pressure, S, DP                        27    mm Hg    <=30  RV s&', lateral, S                         12.8  cm/s     ---------  Legend: (L)  and  (H)  mark values outside specified reference range.  ------------------------------------------------------------------- Neena Rhymes, MD [id number]:20:36   RIGHT HEART CATH AND CORONARY ANGIOGRAPHY  Conclusion   1. Widely patent, normal coronary arteries 2. Normal right heart hemodynamics and normal LVEDP 3. Severely calcified and restricted aortic valve by plain fluoroscopy with known severe aortic stenosis by noninvasive assessment.  Indications   Severe aortic stenosis [I35.0 (ICD-10-CM)]  Procedural Details/Technique   Technical Details INDICATION: Severe aortic stenosis. Pre-operative study.   PROCEDURAL DETAILS: There was an indwelling IV in a right antecubital vein. Using normal sterile technique, the IV was changed out for a 5 Fr brachial  sheath over a 0.018 inch wire. The right wrist was then prepped, draped, and anesthetized with 1% lidocaine. Using the modified Seldinger technique a 5/6 French Slender sheath was placed in the right radial artery. Intra-arterial verapamil was administered through the radial artery sheath. IV heparin was administered after a JR4 catheter was advanced into the central aorta. A Swan-Ganz catheter was used for the right heart catheterization. Standard protocol was followed for recording of right heart pressures and sampling of oxygen saturations. Fick cardiac output was calculated. Standard Judkins catheters were used for selective coronary angiography.  The aortic valve was not crossed because of known critical aortic stenosis. There were no immediate procedural complications. The patient was transferred to the post catheterization recovery area for further monitoring.     Estimated blood loss <50 mL.  During this procedure the patient was administered the following to achieve and maintain moderate conscious sedation: Versed 3 mg, Fentanyl 50 mcg, while the patient's heart rate, blood pressure, and oxygen saturation were continuously monitored. The period of conscious sedation was 37 minutes, of which I was present face-to-face 100% of this time.    Coronary Findings   Dominance: Right  Left Main  Vessel is angiographically normal.  Left Anterior Descending  Vessel is angiographically normal.  First Diagonal Branch  Vessel is angiographically normal.  Left Circumflex  Vessel is angiographically normal.  First Obtuse Marginal Branch  Vessel is angiographically normal.  Second Obtuse Marginal Branch  Vessel is small in size.  Right Coronary Artery  Vessel is angiographically normal. This is a large, dominant vessel. The PDA and PLA branches are widely patent. The vessel is smooth throughout its course without significant stenosis.  Coronary Diagrams   Diagnostic Diagram       Implants      No implant documentation for this case.  MERGE Images   Show images for CARDIAC CATHETERIZATION   Link to Procedure Log   Procedure Log    Hemo Data    Most Recent Value  Fick Cardiac Output 6.91 L/min  Fick Cardiac Output Index 3.46 (L/min)/BSA  RA A Wave 7 mmHg  RA V Wave 4 mmHg  RA Mean 4 mmHg  RV Systolic Pressure 26 mmHg  RV Diastolic Pressure 0 mmHg  RV EDP 6 mmHg  PA Systolic Pressure 29 mmHg  PA Diastolic Pressure 6 mmHg  PA Mean 18 mmHg  PW A Wave 15 mmHg  PW V Wave 11 mmHg  PW Mean 10 mmHg  AO Systolic Pressure 409 mmHg  AO Diastolic Pressure 60 mmHg  AO Mean 88 mmHg  QP/QS 1  TPVR Index 5.21 HRUI  TSVR Index 25.45 HRUI  PVR SVR Ratio 0.1  TPVR/TSVR Ratio 0.2    Cardiac TAVR CT  TECHNIQUE: The patient was scanned on a [employer]. A 120 kV retrospective scan was triggered in the descending thoracic aorta at 111 HU's. Gantry rotation speed was 250 msecs and collimation was .6 mm. No beta blockade or nitro were given. The 3D data set was reconstructed in 5% intervals of the R-R cycle. Systolic and diastolic phases were analyzed on a dedicated work station using MPR, MIP and VRT modes. The patient received 80 cc of contrast.  FINDINGS: Aortic Valve: Functionally bicuspid, severely thickened and calcified aortic valve with severely restricted leaflet opening. Calcifications are asymmetric predominantly in the non-coronary and right coronary cusps. Minimal calcifications are extending into the LVOT.  Aorta:  Normal size, minimal calcifications, no dissection.  Sinotubular Junction:  28 x 24 mm  Ascending Thoracic Aorta:  31 x 31 mm  Aortic Arch:  27 x 24 mm  Descending Thoracic Aorta:  24 x 24 mm  Sinus of Valsalva Measurements:  Non-coronary:  30 mm  Right -coronary:  29 mm  Left -coronary:  32 mm  Sinus Height:  Non-coronary:  18 mm  Right -coronary:  18 mm  Left -coronary:  21 mm  Coronary Artery  Height above Annulus:  Left Main:  14 mm  Right Coronary:  12 mm  Virtual Basal Annulus Measurements:  Maximum/Minimum  Diameter:  25 mm x 22.1 mm  Average diameter:  22.7 mm  Perimeter:  72.6 mm  Area:  405 mm2  Coronary Arteries:  Normal origin.  Optimum Fluoroscopic Angle for Delivery:  LAO 0 CAU 0  IMPRESSION: 1. Functionally bicuspid, severely thickened and calcified aortic valve with severely restricted leaflet opening. Calcifications are asymmetric predominantly in the non-coronary and right coronary cusps. Minimal calcifications are extending into the LVOT. Annular measurements suitable for a 23 mm Edwards-SAPIEN 3 valve or a 26 mm Medtronic Evolut valve.  2. Sufficient annulus to coronary distance.  3. Optimum Fluoroscopic Angle for Delivery:  LAO 0 CAU 0  4. No thrombus in the left atrial appendage.  5. Dilated pulmonary artery measuring 33 mm suggestive of pulmonary hypertension.  Ena Dawley   Electronically Signed   By: Ena Dawley   On: 09/09/2017 19:22   CT ANGIOGRAPHY CHEST, ABDOMEN AND PELVIS  TECHNIQUE: Multidetector CT imaging through the chest, abdomen and pelvis was performed using the standard protocol during bolus administration of intravenous contrast. Multiplanar reconstructed images and MIPs were obtained and reviewed to evaluate the vascular anatomy.  CONTRAST:  80 mL of Isovue 370.  COMPARISON:  No priors.  FINDINGS: CTA CHEST FINDINGS  Cardiovascular: Heart size is borderline enlarged. There is no significant pericardial fluid, thickening or pericardial calcification. Aortic atherosclerosis (mild), without evidence of aneurysm or dissection in the thoracic vasculature. No definite coronary artery calcifications. Severe thickening and calcification of the aortic valve, compatible with the reported clinical history of aortic stenosis.  Mediastinum/Lymph Nodes: No pathologically enlarged  mediastinal or hilar lymph nodes. Moderate-sized hiatal hernia. No axillary lymphadenopathy.  Lungs/Pleura: Mild linear scarring in the right upper lobe anteriorly and in the inferior segment of the lingula. No acute consolidative airspace disease. No pleural effusions. No suspicious appearing pulmonary nodules or masses.  Musculoskeletal/Soft Tissues: Chronic appearing compression fracture of T8 with approximately 25% loss of anterior vertebral body height. There are no aggressive appearing lytic or blastic lesions noted in the visualized portions of the skeleton. Right shoulder arthroplasty incompletely imaged.  CTA ABDOMEN AND PELVIS FINDINGS  Hepatobiliary: No cystic or solid hepatic lesions. No intra or extrahepatic biliary ductal dilatation. Gallbladder is normal in appearance.  Pancreas: Severe fatty atrophy of the pancreas which is nearly completely replaced with fat. No pancreatic mass. No pancreatic ductal dilatation. No pancreatic or peripancreatic fluid or inflammatory changes.  Spleen: Unremarkable.  Adrenals/Urinary Tract: Bilateral kidneys and bilateral adrenal glands are normal in appearance. There is no hydroureteronephrosis. Urinary bladder is normal in appearance.  Stomach/Bowel: Normal appearance of the intraabdominal portion of the stomach. There is no pathologic dilatation of small bowel or colon. A few scattered colonic diverticulae are noted, without surrounding inflammatory changes to suggest acute diverticulitis at this time. The appendix is not confidently identified and may be surgically absent. Regardless, there are no inflammatory changes noted adjacent to the cecum to suggest the presence of an acute appendicitis at this time.  Vascular/Lymphatic: Aortic atherosclerosis with vascular findings and measurements pertinent to potential TAVR procedure, as detailed below. Celiac axis, superior mesenteric artery and inferior mesenteric  artery are all widely patent without hemodynamically significant stenosis. Single renal arteries bilaterally are both widely patent without hemodynamically significant stenosis. No lymphadenopathy noted in the abdomen or pelvis.  Reproductive: Status posthysterectomy. Ovaries are not confidently identified may be surgically absent or atrophic.  Other: No significant volume of ascites. No pneumoperitoneum. Tiny umbilical hernia containing only omental fat.  Musculoskeletal: There are  no aggressive appearing lytic or blastic lesions noted in the visualized portions of the skeleton.  VASCULAR MEASUREMENTS PERTINENT TO TAVR:  AORTA:  Minimal Aortic Diameter -  15 x 15 mm  Severity of Aortic Calcification -  mild  RIGHT PELVIS:  Right Common Iliac Artery -  Minimal Diameter - 10.5 x 9.6 mm  Tortuosity - mild  Calcification - none  Right External Iliac Artery -  Minimal Diameter - 7.1 x 7.6 mm  Tortuosity - mild  Calcification - none  Right Common Femoral Artery -  Minimal Diameter - 7.6 x 6.8 mm  Tortuosity - mild  Calcification - none  LEFT PELVIS:  Left Common Iliac Artery -  Minimal Diameter - 10.4 x 10.0 mm  Tortuosity - mild  Calcification - none  Left External Iliac Artery -  Minimal Diameter - 7.6 x 7.9 mm  Tortuosity - mild  Calcification - none  Left Common Femoral Artery -  Minimal Diameter - 6.9 x 7.6 mm  Tortuosity - mild  Calcification - none  Review of the MIP images confirms the above findings.  IMPRESSION: 1. Vascular findings and measurements pertinent to potential TAVR procedure, as detailed above. This patient does have suitable pelvic arterial access bilaterally. 2. Severe thickening calcification of the aortic valve, compatible with the reported clinical history of severe aortic stenosis. 3. Aortic atherosclerosis. 4. Severe fatty atrophy of the pancreas. 5. Moderate sized hiatal  hernia. 6. Additional incidental findings, as above. Aortic Atherosclerosis (ICD10-I70.0).   Electronically Signed   By: Vinnie Langton M.D.   On: 09/06/2017 14:50   STS Risk Calculator Procedure: AV Replacement  Risk of Mortality: 2.815%  Morbidity or Mortality: 16.574%  Long Length of Stay: 7.716%  Short Length of Stay: 31.232%  Permanent Stroke: 1.216%  Prolonged Ventilation: 11.203%  DSW Infection: 0.488%  Renal Failure: 5.027%  Reoperation: 6.381%     Impression:  Patient has stage D severe symptomatic aortic stenosis. She presents with slow gradual progressive history of exertional shortness of breath and fatigue consistent with chronic diastolic congestive heart failure, New York Heart Association functional class II. I have personally reviewed the patient's most recent transthoracic echocardiogram, diagnostic cardiac catheterization, and CT angiograms. Echocardiogram demonstrates what appears to be functioning bicuspid aortic valve with severe thickening and restricted leaflet mobility involving all 3 leaflets. Peak velocity across the aortic valve measured 4.9 m/s corresponding to mean transvalvular gradient estimated 59 mmHg. Left ventricular systolic function remains normal. Diagnostic cardiac catheterization is notable for the absence of significant coronary artery disease. CT angiography reveals no sign of aneurysmal enlargement nor significant calcification of the aortic root.  The patient's aortic annulus appears to be adequate size for placement of a 21 mm stented bioprosthetic tissue valve without need for root enlargement. Risks associated with conventional surgical aortic valve replacement are likely relatively low. Patient might be reasonable candidate for minimally invasive approach for surgery, although I would favor partial upper sternotomy rather than mini thoracotomy due to the patient's body habitus and the position of the heart within the  chest.   Plan:  The patient and her family were counseled at length regarding treatment alternatives for management of severe aortic stenosis including continued medical therapy versus proceeding with aortic valve replacement in the near future.  The natural history of aortic stenosis was reviewed, as was long term prognosis with medical therapy alone.  Surgical options were discussed at length including conventional surgical aortic valve replacement through either a full median sternotomy or  using minimally invasive techniques.  Other alternatives including rapid-deployment bioprosthetic tissue valve replacement, transcatheter aortic valve replacement, patch enlargement of the aortic root, and stentless porcine aortic root replacement were discussed.  The specific indications for transcatheter aortic valve replacement as an alternative to conventional surgery were reviewed.  Discussion was held comparing the relative risks of mechanical valve replacement with need for lifelong anticoagulation versus use of a bioprosthetic tissue valve and the associated potential for late structural valve deterioration and failure.  This discussion was placed in the context of the patient's particular circumstances, and as a result the patient specifically requests that their valve be replaced using a bioprosthetic tissue valve.   The patient understands and accepts all potential associated risks of surgery including but not limited to risk of death, stroke, myocardial infarction, congestive heart failure, respiratory failure, renal failure, pneumonia, bleeding requiring blood transfusion and or reexploration, arrhythmia, heart block or bradycardia requiring permanent pacemaker, aortic dissection or other major vascular complication, pleural effusions or other delayed complications related to continued congestive heart failure, and other late complications related to valve replacement including structural valve deterioration  and failure, thrombosis, endocarditis, or paravalvular leak.  We plan to proceed with surgery on 10/16/2017. The patient will return to our office for follow-up prior to surgery on 10/14/2017.   I spent in excess of 90 minutes during the conduct of this office consultation and >50% of this time involved direct face-to-face encounter with the patient for counseling and/or coordination of their care.    [name]. [name], MD 09/16/2017 9:48 AM

## 2017-09-16 NOTE — Patient Instructions (Signed)
Continue all previous medications without any changes at this time  

## 2017-09-24 ENCOUNTER — Ambulatory Visit (INDEPENDENT_AMBULATORY_CARE_PROVIDER_SITE_OTHER): Payer: Medicare Other

## 2017-09-24 DIAGNOSIS — Z23 Encounter for immunization: Secondary | ICD-10-CM

## 2017-10-07 ENCOUNTER — Encounter: Payer: Self-pay | Admitting: Nurse Practitioner

## 2017-10-07 ENCOUNTER — Ambulatory Visit: Payer: Medicare Other | Admitting: Nurse Practitioner

## 2017-10-07 VITALS — BP 133/71 | HR 67 | Temp 97.0°F | Ht 64.0 in | Wt 214.0 lb

## 2017-10-07 DIAGNOSIS — E119 Type 2 diabetes mellitus without complications: Secondary | ICD-10-CM | POA: Diagnosis not present

## 2017-10-07 DIAGNOSIS — M810 Age-related osteoporosis without current pathological fracture: Secondary | ICD-10-CM

## 2017-10-07 DIAGNOSIS — E782 Mixed hyperlipidemia: Secondary | ICD-10-CM | POA: Diagnosis not present

## 2017-10-07 DIAGNOSIS — E785 Hyperlipidemia, unspecified: Secondary | ICD-10-CM | POA: Diagnosis not present

## 2017-10-07 DIAGNOSIS — I350 Nonrheumatic aortic (valve) stenosis: Secondary | ICD-10-CM | POA: Diagnosis not present

## 2017-10-07 DIAGNOSIS — I10 Essential (primary) hypertension: Secondary | ICD-10-CM | POA: Diagnosis not present

## 2017-10-07 MED ORDER — HYDROCHLOROTHIAZIDE 25 MG PO TABS: 25.0000 mg | ORAL_TABLET | Freq: Every day | ORAL | 1 refills | Status: DC

## 2017-10-07 MED ORDER — LISINOPRIL 40 MG PO TABS: 40.0000 mg | ORAL_TABLET | Freq: Every day | ORAL | 1 refills | Status: DC

## 2017-10-07 MED ORDER — METFORMIN HCL ER 500 MG PO TB24: 500.0000 mg | ORAL_TABLET | Freq: Two times a day (BID) | ORAL | 1 refills | Status: DC

## 2017-10-07 MED ORDER — DILTIAZEM HCL ER COATED BEADS 120 MG PO CP24: 120.0000 mg | ORAL_CAPSULE | Freq: Every day | ORAL | 1 refills | Status: DC

## 2017-10-07 MED ORDER — ATORVASTATIN CALCIUM 10 MG PO TABS: ORAL_TABLET | ORAL | 1 refills | Status: DC

## 2017-10-07 NOTE — Patient Instructions (Signed)
Bone Health Bones protect organs, store calcium, and anchor muscles. Good health habits, such as eating nutritious foods and exercising regularly, are important for maintaining healthy bones. They can also help to prevent a condition that causes bones to lose density and become weak and brittle (osteoporosis). Why is bone mass important? Bone mass refers to the amount of bone tissue that you have. The higher your bone mass, the stronger your bones. An important step toward having healthy bones throughout life is to have strong and dense bones during childhood. A young adult who has a high bone mass is more likely to have a high bone mass later in life. Bone mass at its greatest it is called peak bone mass. A large decline in bone mass occurs in older adults. In women, it occurs about the time of menopause. During this time, it is important to practice good health habits, because if more bone is lost than what is replaced, the bones will become less healthy and more likely to break (fracture). If you find that you have a low bone mass, you may be able to prevent osteoporosis or further bone loss by changing your diet and lifestyle. How can I find out if my bone mass is low? Bone mass can be measured with an X-ray test that is called a bone mineral density (BMD) test. This test is recommended for all women who are age 65 or older. It may also be recommended for men who are age 70 or older, or for people who are more likely to develop osteoporosis due to:  Having bones that break easily.  Having a long-term disease that weakens bones, such as kidney disease or rheumatoid arthritis.  Having menopause earlier than normal.  Taking medicine that weakens bones, such as steroids, thyroid hormones, or hormone treatment for breast cancer or prostate cancer.  Smoking.  Drinking three or more alcoholic drinks each day.  What are the nutritional recommendations for healthy bones? To have healthy bones, you  need to get enough of the right minerals and vitamins. Most nutrition experts recommend getting these nutrients from the foods that you eat. Nutritional recommendations vary from person to person. Ask your health care provider what is healthy for you. Here are some general guidelines. Calcium Recommendations Calcium is the most important (essential) mineral for bone health. Most people can get enough calcium from their diet, but supplements may be recommended for people who are at risk for osteoporosis. Good sources of calcium include:  Dairy products, such as low-fat or nonfat milk, cheese, and yogurt.  Dark green leafy vegetables, such as bok choy and broccoli.  Calcium-fortified foods, such as orange juice, cereal, bread, soy beverages, and tofu products.  Nuts, such as almonds.  Follow these recommended amounts for daily calcium intake:  Children, age 1?3: 700 mg.  Children, age 4?8: 1,000 mg.  Children, age 9?13: 1,300 mg.  Teens, age 14?18: 1,300 mg.  Adults, age 19?50: 1,000 mg.  Adults, age 51?70: ? Men: 1,000 mg. ? Women: 1,200 mg.  Adults, age 71 or older: 1,200 mg.  Pregnant and breastfeeding females: ? Teens: 1,300 mg. ? Adults: 1,000 mg.  Vitamin D Recommendations Vitamin D is the most essential vitamin for bone health. It helps the body to absorb calcium. Sunlight stimulates the skin to make vitamin D, so be sure to get enough sunlight. If you live in a cold climate or you do not get outside often, your health care provider may recommend that you take vitamin   D supplements. Good sources of vitamin D in your diet include:  Egg yolks.  Saltwater fish.  Milk and cereal fortified with vitamin D.  Follow these recommended amounts for daily vitamin D intake:  Children and teens, age 1?18: 600 international units.  Adults, age 50 or younger: 400-800 international units.  Adults, age 51 or older: 800-1,000 international units.  Other Nutrients Other nutrients  for bone health include:  Phosphorus. This mineral is found in meat, poultry, dairy foods, nuts, and legumes. The recommended daily intake for adult men and adult women is 700 mg.  Magnesium. This mineral is found in seeds, nuts, dark green vegetables, and legumes. The recommended daily intake for adult men is 400?420 mg. For adult women, it is 310?320 mg.  Vitamin K. This vitamin is found in green leafy vegetables. The recommended daily intake is 120 mg for adult men and 90 mg for adult women.  What type of physical activity is best for building and maintaining healthy bones? Weight-bearing and strength-building activities are important for building and maintaining peak bone mass. Weight-bearing activities cause muscles and bones to work against gravity. Strength-building activities increases muscle strength that supports bones. Weight-bearing and muscle-building activities include:  Walking and hiking.  Jogging and running.  Dancing.  Gym exercises.  Lifting weights.  Tennis and racquetball.  Climbing stairs.  Aerobics.  Adults should get at least 30 minutes of moderate physical activity on most days. Children should get at least 60 minutes of moderate physical activity on most days. Ask your health care provide what type of exercise is best for you. Where can I find more information? For more information, check out the following websites:  National Osteoporosis Foundation: http://nof.org/learn/basics  National Institutes of Health: http://www.niams.nih.gov/Health_Info/Bone/Bone_Health/bone_health_for_life.asp  This information is not intended to replace advice given to you by your health care provider. Make sure you discuss any questions you have with your health care provider. Document Released: 01/26/2004 Document Revised: 05/25/2016 Document Reviewed: 11/10/2014 Elsevier Interactive Patient Education  2018 Elsevier Inc.  

## 2017-10-07 NOTE — Progress Notes (Signed)
Subjective:    Patient ID: Toni Cooper, female    DOB: 05-Jul-1946, 71 y.o.   MRN: [id number]  HPI  Toni Cooper is here today for follow up of chronic medical problem.  Outpatient Encounter Medications as of 10/07/2017  Medication Sig  . acetaminophen (TYLENOL) 500 MG tablet Take 500 mg 2 (two) times daily as needed by mouth for moderate pain or headache.   . alendronate (FOSAMAX) 70 MG tablet Take 1 tablet (70 mg total) by mouth every 7 (seven) days. Take with a full glass of water on an empty stomach. (Patient taking differently: Take 70 mg every 7 (seven) days by mouth. Take with a full glass of water on an empty stomach. On Sundays)  . Ascorbic Acid (VITAMIN C) 1000 MG tablet Take 1,000 mg daily by mouth.  [name] aspirin EC 81 MG tablet Take 81 mg by mouth daily.  [name] atorvastatin (LIPITOR) 10 MG tablet TAKE ONE-HALF TABLET BY  MOUTH DAILY AT 6PM  . beta carotene w/minerals (OCUVITE) tablet Take 1 tablet by mouth daily.  . calcium carbonate (TUMS - DOSED IN MG ELEMENTAL CALCIUM) 500 MG chewable tablet Chew 1 tablet by mouth daily as needed for indigestion or heartburn.  . calcium-vitamin D (OSCAL 500/200 D-3) 500-200 MG-UNIT tablet Take 1 tablet by mouth 2 (two) times daily.  . cholecalciferol (VITAMIN D) 1000 UNITS tablet Take 1,000 Units by mouth daily.  [name] diltiazem (CARDIZEM CD) 120 MG 24 hr capsule TAKE 1 CAPSULE BY MOUTH  DAILY  . fluticasone (FLONASE) 50 MCG/ACT nasal spray Place 1 spray into both nostrils daily as needed for allergies or rhinitis.  [name] HUMALOG 100 UNIT/ML injection Inject 6-20 Units into the skin 3 (three) times daily with meals. Using sliding scale.  . hydrochlorothiazide (HYDRODIURIL) 25 MG tablet TAKE 1 TABLET BY MOUTH  DAILY  . ibuprofen (ADVIL,MOTRIN) 200 MG tablet Take 400 mg 2 (two) times daily as needed by mouth for headache or moderate pain.   [name] LANTUS 100 UNIT/ML injection Inject 35 Units into the skin at bedtime.   [name] lisinopril (PRINIVIL,ZESTRIL) 40 MG tablet TAKE  1 TABLET BY MOUTH  DAILY  . Magnesium 250 MG TABS Take 250 mg by mouth daily.   . metFORMIN (GLUCOPHAGE-XR) 500 MG 24 hr tablet Take 500 mg by mouth 2 (two) times daily.   . Multiple Vitamins-Minerals (MULTI-BETIC DIABETES) TABS Take 1 tablet daily by mouth.  . Omega-3 Fatty Acids (FISH OIL) 1200 MG CAPS Take 1,200 mg daily by mouth.  . pyridOXINE (VITAMIN B-6) 100 MG tablet Take 100 mg by mouth daily.  [name] TEST test strip Test four times daily.   No facility-administered encounter medications on file as of 10/07/2017.     1. Essential hypertension  No c/o chest pain or headace. She doe shave some SOB due to her aortic stenosis. She does not check blood pressure at home  2. Nonrheumatic aortic valve stenosis  She is scheduled for minimally invasive aortic valve replacement on 10/16/17. She is a little nervous about her proceduere but her fatigue and SOB really warrant her having it done.  3. Type 2 diabetes mellitus without complication, without long-term current use of insulin (HCC)  Last hgba1c was 7.1%. SHe has really been trying to watch diet since she is getting ready to have surgery.  4. Age-related osteoporosis without current pathological fracture  Right now she is not doing any exercise. Hopes to start after she has surgery. Sh has stopped her  fosamax and is not to start back on it for 3 months after surgery.  5. Hyperlipidemia, unspecified hyperlipidemia type  Avoiding fried foods    New complaints: None today  Social history: Lives with husband- daughter is a Designer, jewellery hospitalist    Review of Systems  Constitutional: Negative for activity change and appetite change.  HENT: Negative.   Eyes: Negative for pain.  Respiratory: Negative for shortness of breath.   Cardiovascular: Negative for chest pain, palpitations and leg swelling.  Gastrointestinal: Negative for abdominal pain.  Endocrine: Negative for polydipsia.  Genitourinary: Negative.   Skin:  Negative for rash.  Neurological: Negative for dizziness, weakness and headaches.  Hematological: Does not bruise/bleed easily.  Psychiatric/Behavioral: Negative.   All other systems reviewed and are negative.      Objective:   Physical Exam  Constitutional: She is oriented to person, place, and time. She appears well-developed and well-nourished.  HENT:  Nose: Nose normal.  Mouth/Throat: Oropharynx is clear and moist.  Eyes: EOM are normal.  Neck: Trachea normal, normal range of motion and full passive range of motion without pain. Neck supple. No JVD present. Carotid bruit is not present. No thyromegaly present.  Cardiovascular: Normal rate, regular rhythm and intact distal pulses. Exam reveals no gallop and no friction rub.  Murmur (3/6 systolic murmur) heard. Pulmonary/Chest: Effort normal and breath sounds normal.  Abdominal: Soft. Bowel sounds are normal. She exhibits no distension and no mass. There is no tenderness.  Musculoskeletal: Normal range of motion.  Lymphadenopathy:    She has no cervical adenopathy.  Neurological: She is alert and oriented to person, place, and time. She has normal reflexes.  Skin: Skin is warm and dry.  Psychiatric: She has a normal mood and affect. Her behavior is normal. Judgment and thought content normal.   BP 133/71   Pulse 67   Temp (!) 97 F (36.1 C) (Oral)   Ht 5\' 4"  (1.626 m)   Wt 214 lb (97.1 kg)   BMI 36.73 kg/m       Assessment & Plan:  1. Essential hypertension Low sodium diet - lisinopril (PRINIVIL,ZESTRIL) 40 MG tablet; Take 1 tablet (40 mg total) daily by mouth.  Dispense: 90 tablet; Refill: 1 - hydrochlorothiazide (HYDRODIURIL) 25 MG tablet; Take 1 tablet (25 mg total) daily by mouth.  Dispense: 90 tablet; Refill: 1  2. Nonrheumatic aortic valve stenosis - diltiazem (CARDIZEM CD) 120 MG 24 hr capsule; Take 1 capsule (120 mg total) daily by mouth.  Dispense: 90 capsule; Refill: 1  3. Type 2 diabetes mellitus without  complication, without long-term current use of insulin (HCC) continue to watch carbs in diet - metFORMIN (GLUCOPHAGE-XR) 500 MG 24 hr tablet; Take 1 tablet (500 mg total) 2 (two) times daily by mouth.  Dispense: 180 tablet; Refill: 1  4. Age-related osteoporosis without current pathological fracture Weight bearing exercises to start after has valve replacement  5.  Mixed hyperlipidemia Low fat diet - atorvastatin (LIPITOR) 10 MG tablet; TAKE ONE-HALF TABLET BY  MOUTH DAILY AT 6PM  Dispense: 45 tablet; Refill: 1    Labs pending Health maintenance reviewed Diet and exercise encouraged Continue all meds Follow up  In 3 months   [place], FNP

## 2017-10-14 ENCOUNTER — Ambulatory Visit: Payer: Medicare Other | Admitting: Thoracic Surgery (Cardiothoracic Vascular Surgery)

## 2017-10-14 ENCOUNTER — Encounter (HOSPITAL_COMMUNITY)
Admission: RE | Admit: 2017-10-14 | Discharge: 2017-10-14 | Disposition: A | Discharge status: Home / Self Care | Payer: Medicare Other | Source: Ambulatory Visit | Attending: Thoracic Surgery (Cardiothoracic Vascular Surgery) | Admitting: Thoracic Surgery (Cardiothoracic Vascular Surgery)

## 2017-10-14 ENCOUNTER — Ambulatory Visit (HOSPITAL_BASED_OUTPATIENT_CLINIC_OR_DEPARTMENT_OTHER)
Admission: RE | Admit: 2017-10-14 | Discharge: 2017-10-14 | Disposition: A | Discharge status: Home / Self Care | Payer: Medicare Other | Source: Ambulatory Visit | Attending: Thoracic Surgery (Cardiothoracic Vascular Surgery) | Admitting: Thoracic Surgery (Cardiothoracic Vascular Surgery)

## 2017-10-14 ENCOUNTER — Ambulatory Visit (HOSPITAL_COMMUNITY)
Admission: RE | Admit: 2017-10-14 | Discharge: 2017-10-14 | Disposition: A | Discharge status: Home / Self Care | Payer: Medicare Other | Source: Ambulatory Visit | Attending: Thoracic Surgery (Cardiothoracic Vascular Surgery) | Admitting: Thoracic Surgery (Cardiothoracic Vascular Surgery)

## 2017-10-14 ENCOUNTER — Other Ambulatory Visit: Payer: Self-pay

## 2017-10-14 ENCOUNTER — Encounter (HOSPITAL_COMMUNITY): Payer: Self-pay

## 2017-10-14 ENCOUNTER — Encounter: Payer: Self-pay | Admitting: Thoracic Surgery (Cardiothoracic Vascular Surgery)

## 2017-10-14 VITALS — BP 136/74 | HR 80 | Ht 64.0 in | Wt 212.0 lb

## 2017-10-14 DIAGNOSIS — E119 Type 2 diabetes mellitus without complications: Secondary | ICD-10-CM

## 2017-10-14 DIAGNOSIS — M810 Age-related osteoporosis without current pathological fracture: Secondary | ICD-10-CM | POA: Insufficient documentation

## 2017-10-14 DIAGNOSIS — Z01810 Encounter for preprocedural cardiovascular examination: Secondary | ICD-10-CM

## 2017-10-14 DIAGNOSIS — Z01812 Encounter for preprocedural laboratory examination: Secondary | ICD-10-CM

## 2017-10-14 DIAGNOSIS — I10 Essential (primary) hypertension: Secondary | ICD-10-CM

## 2017-10-14 DIAGNOSIS — Z01818 Encounter for other preprocedural examination: Secondary | ICD-10-CM

## 2017-10-14 DIAGNOSIS — K449 Diaphragmatic hernia without obstruction or gangrene: Secondary | ICD-10-CM | POA: Insufficient documentation

## 2017-10-14 DIAGNOSIS — I350 Nonrheumatic aortic (valve) stenosis: Secondary | ICD-10-CM | POA: Diagnosis not present

## 2017-10-14 HISTORY — DX: Dyspnea, unspecified: R06.00

## 2017-10-14 LAB — PULMONARY FUNCTION TEST
DL/VA % pred: 106 %
DL/VA: 5.13 ml/min/mmHg/L
DLCO UNC % PRED: 73 %
DLCO unc: 17.92 ml/min/mmHg
FEF 25-75 POST: 2.52 L/s
FEF 25-75 PRE: 2.04 L/s
FEF2575-%CHANGE-POST: 23 %
FEF2575-%PRED-POST: 140 %
FEF2575-%Pred-Pre: 114 %
FEV1-%Change-Post: 5 %
FEV1-%PRED-POST: 78 %
FEV1-%Pred-Pre: 74 %
FEV1-POST: 1.73 L
FEV1-Pre: 1.65 L
FEV1FVC-%Change-Post: 0 %
FEV1FVC-%PRED-PRE: 113 %
FEV6-%Change-Post: 4 %
FEV6-%Pred-Post: 72 %
FEV6-%Pred-Pre: 69 %
FEV6-POST: 2.02 L
FEV6-Pre: 1.93 L
FEV6FVC-%PRED-POST: 104 %
FEV6FVC-%Pred-Pre: 104 %
FVC-%CHANGE-POST: 4 %
FVC-%PRED-PRE: 66 %
FVC-%Pred-Post: 69 %
FVC-PRE: 1.93 L
FVC-Post: 2.02 L
PRE FEV1/FVC RATIO: 85 %
Post FEV1/FVC ratio: 86 %
Post FEV6/FVC ratio: 100 %
Pre FEV6/FVC Ratio: 100 %
RV % PRED: 143 %
RV: 3.2 L
TLC % PRED: 107 %
TLC: 5.42 L

## 2017-10-14 LAB — URINALYSIS, ROUTINE W REFLEX MICROSCOPIC
Bilirubin Urine: NEGATIVE
GLUCOSE, UA: NEGATIVE mg/dL
Hgb urine dipstick: NEGATIVE
KETONES UR: NEGATIVE mg/dL
LEUKOCYTES UA: NEGATIVE
NITRITE: NEGATIVE
PROTEIN: NEGATIVE mg/dL
Specific Gravity, Urine: 1.016 (ref 1.005–1.030)
pH: 7 (ref 5.0–8.0)

## 2017-10-14 LAB — BLOOD GAS, ARTERIAL
Acid-Base Excess: 3.1 mmol/L — ABNORMAL HIGH (ref 0.0–2.0)
BICARBONATE: 26.7 mmol/L (ref 20.0–28.0)
Drawn by: 421801
FIO2: 21
O2 Saturation: 93.5 %
PCO2 ART: 37.7 mmHg (ref 32.0–48.0)
PH ART: 7.463 — AB (ref 7.350–7.450)
PO2 ART: 82.3 mmHg — AB (ref 83.0–108.0)
Patient temperature: 98.6

## 2017-10-14 LAB — CBC
HEMATOCRIT: 39 % (ref 36.0–46.0)
Hemoglobin: 12.7 g/dL (ref 12.0–15.0)
MCH: 30.1 pg (ref 26.0–34.0)
MCHC: 32.6 g/dL (ref 30.0–36.0)
MCV: 92.4 fL (ref 78.0–100.0)
Platelets: 212 10*3/uL (ref 150–400)
RBC: 4.22 MIL/uL (ref 3.87–5.11)
RDW: 14.5 % (ref 11.5–15.5)
WBC: 10.7 10*3/uL — ABNORMAL HIGH (ref 4.0–10.5)

## 2017-10-14 LAB — APTT: aPTT: 27 seconds (ref 24–36)

## 2017-10-14 LAB — COMPREHENSIVE METABOLIC PANEL
ALK PHOS: 58 U/L (ref 38–126)
ALT: 18 U/L (ref 14–54)
ANION GAP: 8 (ref 5–15)
AST: 24 U/L (ref 15–41)
Albumin: 3.6 g/dL (ref 3.5–5.0)
BILIRUBIN TOTAL: 0.6 mg/dL (ref 0.3–1.2)
BUN: 21 mg/dL — AB (ref 6–20)
CALCIUM: 9.5 mg/dL (ref 8.9–10.3)
CO2: 24 mmol/L (ref 22–32)
Chloride: 108 mmol/L (ref 101–111)
Creatinine, Ser: 0.87 mg/dL (ref 0.44–1.00)
GFR calc Af Amer: >60 mL/min (ref >60)
Glucose, Bld: 134 mg/dL — ABNORMAL HIGH (ref 65–99)
POTASSIUM: 3.7 mmol/L (ref 3.5–5.1)
Sodium: 140 mmol/L (ref 135–145)
TOTAL PROTEIN: 6.3 g/dL — AB (ref 6.5–8.1)

## 2017-10-14 LAB — GLUCOSE, CAPILLARY: Glucose-Capillary: 135 mg/dL — ABNORMAL HIGH (ref 65–99)

## 2017-10-14 LAB — PROTIME-INR
INR: 1.01
PROTHROMBIN TIME: 13.2 s (ref 11.4–15.2)

## 2017-10-14 LAB — SURGICAL PCR SCREEN
MRSA, PCR: NEGATIVE
STAPHYLOCOCCUS AUREUS: NEGATIVE

## 2017-10-14 LAB — HEMOGLOBIN A1C
HEMOGLOBIN A1C: 6.9 % — AB (ref 4.8–5.6)
Mean Plasma Glucose: 151.33 mg/dL

## 2017-10-14 MED ORDER — ALBUTEROL SULFATE (2.5 MG/3ML) 0.083% IN NEBU
2.5000 mg | INHALATION_SOLUTION | Freq: Once | RESPIRATORY_TRACT | Status: AC
  Administered 2017-10-14: 2.5 mg via RESPIRATORY_TRACT

## 2017-10-14 NOTE — Pre-Procedure Instructions (Signed)
NAHJAE HOEG  10/14/2017      [facility] [address] [place] [address] [place] [address] Phone: [phone] Fax: [phone]  [place], [place] [address] Phone: [phone] Fax: [phone]  [place], [place] [facility] [address] [place] [address] [place] [address] Phone: [phone] Fax: [phone]    Your procedure is scheduled on 10/16/17.  Report to [facility] Admitting at 530 A.M.  Call this number if you have problems the morning of surgery:  [phone]   Remember:  Do not eat food or drink liquids after midnight.  Take these medicines the morning of surgery with A SIP OF WATER --tylenol,cardizem,aspirin  Do not wear jewelry, make-up or nail polish.  Do not wear lotions, powders, or perfumes, or deoderant.  Do not shave 48 hours prior to surgery.  Men may shave face and neck.  Do not bring valuables to the hospital.  [facility] is not responsible for any belongings or valuables.  Contacts, dentures or bridgework may not be worn into surgery.  Leave your suitcase in the car.  After surgery it may be brought to your room.  For patients admitted to the hospital, discharge time will be determined by your treatment team.  Patients discharged the day of surgery will not be allowed to drive home.   Name and phone number of your driver:   Special instructions:     How to Manage Your Diabetes Before and After Surgery  Why is it important to control my blood sugar before and after surgery? . Improving blood sugar levels before and after surgery helps healing and can limit problems. . A way of improving blood sugar control is eating a healthy diet by: o  Eating less sugar and carbohydrates o  Increasing activity/exercise o  Talking with your doctor about reaching your blood sugar goals . High blood  sugars (greater than 180 mg/dL) can raise your risk of infections and slow your recovery, so you will need to focus on controlling your diabetes during the weeks before surgery. . Make sure that the doctor who takes care of your diabetes knows about your planned surgery including the date and location.  How do I manage my blood sugar before surgery? . Check your blood sugar at least 4 times a day, starting 2 days before surgery, to make sure that the level is not too high or low. o Check your blood sugar the morning of your surgery when you wake up and every 2 hours until you get to the Short Stay unit. . If your blood sugar is less than 70 mg/dL, you will need to treat for low blood sugar: o Do not take insulin. o Treat a low blood sugar (less than 70 mg/dL) with  cup of clear juice (cranberry or apple), 4 glucose tablets, OR glucose gel. Recheck blood sugar in 15 minutes after treatment (to make sure it is greater than 70 mg/dL). If your blood sugar is not greater than 70 mg/dL on recheck, call [phone] o  for further instructions. . Report your blood sugar to the short stay nurse when you get to Short Stay.  . If you are admitted to the hospital after surgery: o Your blood sugar will be checked by the staff and you will probably be given insulin after surgery (instead of oral  diabetes medicines) to make sure you have good blood sugar levels. o The goal for blood sugar control after surgery is 80-180 mg/dL.              WHAT DO I DO ABOUT MY DIABETES MEDICATION?   [name] Do not take oral diabetes medicines (pills) the morning of surgery.  . THE NIGHT BEFORE SURGERY, take ___________ units of ___________insulin.       [name] HE MORNING OF SURGERY, take _____________ units of __________insulin.  . The day of surgery, do not take other diabetes injectables, including Byetta (exenatide), Bydureon (exenatide ER), Victoza (liraglutide), or Trulicity (dulaglutide).  . If your CBG is  greater than 220 mg/dL, you may take  of your sliding scale (correction) dose of insulin.  Other Instructions:          Patient Signature:  Date:   Nurse Signature:  Date:   Reviewed and Endorsed by [facility] Patient Education Committee, August 2015  Please read over the following fact sheets that you were given. MRSA Information

## 2017-10-14 NOTE — Patient Instructions (Addendum)
Stop taking aspirin and metformin.  Continue taking other current medications without change through the day before surgery.  Have nothing to eat or drink after midnight the night before surgery.  On the morning of surgery do not take any of your regularly scheduled medications

## 2017-10-14 NOTE — Progress Notes (Signed)
Pre-op Cardiac Surgery  Carotid Findings:  1-39% ICA plaquing. Vertebral artery flow is antegrade.   Upper Extremity Right Left  Brachial Pressures 138T 126T  Radial Waveforms T T  Ulnar Waveforms T T  Palmar Arch (Allen's Test) WNL WNL   Findings:      Lower  Extremity Right Left  Dorsalis Pedis    Anterior Tibial    Posterior Tibial    Ankle/Brachial Indices      Findings:

## 2017-10-14 NOTE — Progress Notes (Signed)
[address] [facility]       [place] [address]             [phone]     CARDIOTHORACIC SURGERY OFFICE NOTE  Referring Provider is [name], MD PCP is [name], FNP   HPI:  Patient returns to the office today for follow-up of severe symptomatic aortic stenosis with tentative plans to proceed with aortic valve replacement on Wednesday, October 16, 2017.  She was originally seen in consultation on September 16, 2017.  She reports no new problems or complaints over the last few weeks.  She specifically denies any fevers, chills, or productive cough.  Appetite is stable.  Bowel function is normal.  She describes stable symptoms of exertional shortness of breath and fatigue unchanged from previously.   Current Outpatient Medications  Medication Sig Dispense Refill  . acetaminophen (TYLENOL) 500 MG tablet Take 500 mg 2 (two) times daily as needed by mouth for moderate pain or headache.     . Ascorbic Acid (VITAMIN C) 1000 MG tablet Take 1,000 mg daily by mouth.    [name] aspirin EC 81 MG tablet Take 81 mg by mouth daily.    [name] atorvastatin (LIPITOR) 10 MG tablet TAKE ONE-HALF TABLET BY  MOUTH DAILY AT 6PM 45 tablet 1  . beta carotene w/minerals (OCUVITE) tablet Take 1 tablet by mouth daily.    . calcium carbonate (TUMS - DOSED IN MG ELEMENTAL CALCIUM) 500 MG chewable tablet Chew 1 tablet by mouth daily as needed for indigestion or heartburn.    . calcium-vitamin D (OSCAL 500/200 D-3) 500-200 MG-UNIT tablet Take 1 tablet by mouth 2 (two) times daily.    . cholecalciferol (VITAMIN D) 1000 UNITS tablet Take 1,000 Units by mouth daily.    [name] diltiazem (CARDIZEM CD) 120 MG 24 hr capsule Take 1 capsule (120 mg total) daily by mouth. 90 capsule 1  . fluticasone (FLONASE) 50 MCG/ACT nasal spray Place 1 spray into both nostrils daily as needed for allergies or rhinitis.    [name] HUMALOG 100 UNIT/ML injection Inject 6-20 Units into the skin 3 (three) times daily with meals.  Using sliding scale.    . hydrochlorothiazide (HYDRODIURIL) 25 MG tablet Take 1 tablet (25 mg total) daily by mouth. 90 tablet 1  . ibuprofen (ADVIL,MOTRIN) 200 MG tablet Take 400 mg 2 (two) times daily as needed by mouth for headache or moderate pain.     [name] LANTUS 100 UNIT/ML injection Inject 35 Units into the skin at bedtime.     [name] lisinopril (PRINIVIL,ZESTRIL) 40 MG tablet Take 1 tablet (40 mg total) daily by mouth. 90 tablet 1  . Magnesium 250 MG TABS Take 250 mg by mouth daily.     . metFORMIN (GLUCOPHAGE-XR) 500 MG 24 hr tablet Take 1 tablet (500 mg total) 2 (two) times daily by mouth. 180 tablet 1  . Multiple Vitamins-Minerals (MULTI-BETIC DIABETES) TABS Take 1 tablet daily by mouth.    . Omega-3 Fatty Acids (FISH OIL) 1200 MG CAPS Take 1,200 mg daily by mouth.    . pyridOXINE (VITAMIN B-6) 100 MG tablet Take 100 mg by mouth daily.    [name] TEST test strip Test four times daily.    [name] alendronate (FOSAMAX) 70 MG tablet Take 1 tablet (70 mg total) by mouth every 7 (seven) days. Take with a full glass of water on an empty stomach. (Patient not taking: Reported on 10/07/2017) 12 tablet 3   No current  facility-administered medications for this visit.       Physical Exam:   BP 136/74 (BP Location: Left Arm, Patient Position: Sitting, Cuff Size: Large)   Pulse 80   Ht 5\' 4"  (1.626 m)   Wt 212 lb (96.2 kg)   SpO2 97%   BMI 36.39 kg/m   General:  Well-appearing  Chest:   Clear to auscultation with symmetrical breath sounds  CV:   Regular rate and rhythm with prominent harsh systolic murmur  Incisions:  n/a  Abdomen:  Soft nontender  Extremities:  Warm and well perfused  Diagnostic Tests:  Cardiac TAVR CT  TECHNIQUE: The patient was scanned on a [employer]. A 120 kV retrospective scan was triggered in the descending thoracic aorta at 111 HU's. Gantry rotation speed was 250 msecs and collimation was .6 mm. No beta blockade or nitro were given. The 3D data set  was reconstructed in 5% intervals of the R-R cycle. Systolic and diastolic phases were analyzed on a dedicated work station using MPR, MIP and VRT modes. The patient received 80 cc of contrast.  FINDINGS: Aortic Valve: Functionally bicuspid, severely thickened and calcified aortic valve with severely restricted leaflet opening. Calcifications are asymmetric predominantly in the non-coronary and right coronary cusps. Minimal calcifications are extending into the LVOT.  Aorta:  Normal size, minimal calcifications, no dissection.  Sinotubular Junction:  28 x 24 mm  Ascending Thoracic Aorta:  31 x 31 mm  Aortic Arch:  27 x 24 mm  Descending Thoracic Aorta:  24 x 24 mm  Sinus of Valsalva Measurements:  Non-coronary:  30 mm  Right -coronary:  29 mm  Left -coronary:  32 mm  Sinus Height:  Non-coronary:  18 mm  Right -coronary:  18 mm  Left -coronary:  21 mm  Coronary Artery Height above Annulus:  Left Main:  14 mm  Right Coronary:  12 mm  Virtual Basal Annulus Measurements:  Maximum/Minimum Diameter:  25 mm x 22.1 mm  Average diameter:  22.7 mm  Perimeter:  72.6 mm  Area:  405 mm2  Coronary Arteries:  Normal origin.  Optimum Fluoroscopic Angle for Delivery:  LAO 0 CAU 0  IMPRESSION: 1. Functionally bicuspid, severely thickened and calcified aortic valve with severely restricted leaflet opening. Calcifications are asymmetric predominantly in the non-coronary and right coronary cusps. Minimal calcifications are extending into the LVOT. Annular measurements suitable for a 23 mm Edwards-SAPIEN 3 valve or a 26 mm Medtronic Evolut valve.  2. Sufficient annulus to coronary distance.  3. Optimum Fluoroscopic Angle for Delivery:  LAO 0 CAU 0  4. No thrombus in the left atrial appendage.  5. Dilated pulmonary artery measuring 33 mm suggestive of pulmonary hypertension.  Ena Dawley   Electronically Signed   By: Ena Dawley   On: 09/09/2017 19:22  CT ANGIOGRAPHY CHEST, ABDOMEN AND PELVIS  TECHNIQUE: Multidetector CT imaging through the chest, abdomen and pelvis was performed using the standard protocol during bolus administration of intravenous contrast. Multiplanar reconstructed images and MIPs were obtained and reviewed to evaluate the vascular anatomy.  CONTRAST:  80 mL of Isovue 370.  COMPARISON:  No priors.  FINDINGS: CTA CHEST FINDINGS  Cardiovascular: Heart size is borderline enlarged. There is no significant pericardial fluid, thickening or pericardial calcification. Aortic atherosclerosis (mild), without evidence of aneurysm or dissection in the thoracic vasculature. No definite coronary artery calcifications. Severe thickening and calcification of the aortic valve, compatible with the reported clinical history of aortic stenosis.  Mediastinum/Lymph Nodes:  No pathologically enlarged mediastinal or hilar lymph nodes. Moderate-sized hiatal hernia. No axillary lymphadenopathy.  Lungs/Pleura: Mild linear scarring in the right upper lobe anteriorly and in the inferior segment of the lingula. No acute consolidative airspace disease. No pleural effusions. No suspicious appearing pulmonary nodules or masses.  Musculoskeletal/Soft Tissues: Chronic appearing compression fracture of T8 with approximately 25% loss of anterior vertebral body height. There are no aggressive appearing lytic or blastic lesions noted in the visualized portions of the skeleton. Right shoulder arthroplasty incompletely imaged.  CTA ABDOMEN AND PELVIS FINDINGS  Hepatobiliary: No cystic or solid hepatic lesions. No intra or extrahepatic biliary ductal dilatation. Gallbladder is normal in appearance.  Pancreas: Severe fatty atrophy of the pancreas which is nearly completely replaced with fat. No pancreatic mass. No pancreatic ductal dilatation. No pancreatic or peripancreatic fluid or inflammatory  changes.  Spleen: Unremarkable.  Adrenals/Urinary Tract: Bilateral kidneys and bilateral adrenal glands are normal in appearance. There is no hydroureteronephrosis. Urinary bladder is normal in appearance.  Stomach/Bowel: Normal appearance of the intraabdominal portion of the stomach. There is no pathologic dilatation of small bowel or colon. A few scattered colonic diverticulae are noted, without surrounding inflammatory changes to suggest acute diverticulitis at this time. The appendix is not confidently identified and may be surgically absent. Regardless, there are no inflammatory changes noted adjacent to the cecum to suggest the presence of an acute appendicitis at this time.  Vascular/Lymphatic: Aortic atherosclerosis with vascular findings and measurements pertinent to potential TAVR procedure, as detailed below. Celiac axis, superior mesenteric artery and inferior mesenteric artery are all widely patent without hemodynamically significant stenosis. Single renal arteries bilaterally are both widely patent without hemodynamically significant stenosis. No lymphadenopathy noted in the abdomen or pelvis.  Reproductive: Status posthysterectomy. Ovaries are not confidently identified may be surgically absent or atrophic.  Other: No significant volume of ascites. No pneumoperitoneum. Tiny umbilical hernia containing only omental fat.  Musculoskeletal: There are no aggressive appearing lytic or blastic lesions noted in the visualized portions of the skeleton.  VASCULAR MEASUREMENTS PERTINENT TO TAVR:  AORTA:  Minimal Aortic Diameter -  15 x 15 mm  Severity of Aortic Calcification -  mild  RIGHT PELVIS:  Right Common Iliac Artery -  Minimal Diameter - 10.5 x 9.6 mm  Tortuosity - mild  Calcification - none  Right External Iliac Artery -  Minimal Diameter - 7.1 x 7.6 mm  Tortuosity - mild  Calcification - none  Right Common Femoral Artery  -  Minimal Diameter - 7.6 x 6.8 mm  Tortuosity - mild  Calcification - none  LEFT PELVIS:  Left Common Iliac Artery -  Minimal Diameter - 10.4 x 10.0 mm  Tortuosity - mild  Calcification - none  Left External Iliac Artery -  Minimal Diameter - 7.6 x 7.9 mm  Tortuosity - mild  Calcification - none  Left Common Femoral Artery -  Minimal Diameter - 6.9 x 7.6 mm  Tortuosity - mild  Calcification - none  Review of the MIP images confirms the above findings.  IMPRESSION: 1. Vascular findings and measurements pertinent to potential TAVR procedure, as detailed above. This patient does have suitable pelvic arterial access bilaterally. 2. Severe thickening calcification of the aortic valve, compatible with the reported clinical history of severe aortic stenosis. 3. Aortic atherosclerosis. 4. Severe fatty atrophy of the pancreas. 5. Moderate sized hiatal hernia. 6. Additional incidental findings, as above. Aortic Atherosclerosis (ICD10-I70.0).   Electronically Signed   By: Mauri Brooklyn.D.  On: 09/06/2017 14:50     Impression:  Patient has stage D severe symptomatic aortic stenosis. She presents with slow gradual progressive history of exertional shortness of breath and fatigue consistent with chronic diastolic congestive heart failure, [place] Heart Association functional class II. I have personally reviewed the patient's most recent transthoracic echocardiogram, diagnostic cardiac catheterization, and CT angiograms. Echocardiogram demonstrates what appears to be functioning bicuspid aortic valve with severe thickening and restricted leaflet mobility involving all 3 leaflets. Peak velocity across the aortic valve measured 4.9 m/s corresponding to mean transvalvular gradient estimated 59 mmHg. Left ventricular systolic function remains normal. Diagnostic cardiac catheterization is notable for the absence of significant coronary artery  disease. CT angiography reveals no sign of aneurysmal enlargement nor significant calcification of the aortic root.  The patient's aortic annulus appears to be adequate size for placement of a 21 mm stented bioprosthetic tissue valve without need for root enlargement. Risks associated with conventional surgical aortic valve replacement are likely relatively low. Patient might be reasonable candidate for minimally invasive approach for surgery, although I would favor partial upper sternotomy rather than mini thoracotomy due to the patient's body habitus and the position of the heart within the chest.  CT angiography reveals no complicating features.  Aortic valve is functionally bicuspid and severely stenotic with heavy calcification in the noncoronary and right coronary leaflet.  The aortic annulus is relatively small in size but should be suitable for implantation of at least a 21 mm stented bioprosthetic tissue valve, possibly a 23 mm valve.    Plan:  The patient and her husband were again counseled at length regarding treatment alternatives for management of severe aortic stenosis including continued medical therapy versus proceeding with aortic valve replacement in the near future.  The natural history of aortic stenosis was reviewed, as was long term prognosis with medical therapy alone.  Surgical options were discussed at length including conventional surgical aortic valve replacement through either a full median sternotomy or using minimally invasive techniques.  Other alternatives including rapid-deployment bioprosthetic tissue valve replacement, transcatheter aortic valve replacement, patch enlargement of the aortic root, stentless porcine aortic root replacement, valve repair, the Ross autograft procedure, and homograft aortic root replacement were discussed.  Discussion was held comparing the relative risks of mechanical valve replacement with need for lifelong anticoagulation versus use of a  bioprosthetic tissue valve and the associated potential for late structural valve deterioration and failure.  This discussion was placed in the context of the patient's particular circumstances, and as a result the patient specifically requests that their valve be replaced using a bioprosthetic tissue valve .  The potential advantages and disadvantages associated with use of a rapid-deployment bioprosthetic aortic valve were discussed, including the risks of paravalvular leak, need for permanent pacemaker placement, and expectations for long-term durability. Alternative surgical approaches have been discussed including a comparison between conventional sternotomy and minimally-invasive techniques.  The relative risks and benefits of each have been reviewed as they pertain to the patient's specific circumstances, and all of their questions have been addressed.  Specific risks potentially related to the minimally-invasive approach were discussed at length, including but not limited to risk of conversion to full or partial sternotomy, aortic dissection or other major vascular complication, unilateral acute lung injury or pulmonary edema, phrenic nerve dysfunction or paralysis, rib fracture, chronic pain, lung hernia, or lymphocele.   The patient understands and accepts all potential associated risks of surgery including but not limited to risk of death, stroke, myocardial  infarction, congestive heart failure, respiratory failure, renal failure, pneumonia, bleeding requiring blood transfusion and or reexploration, arrhythmia, heart block or bradycardia requiring permanent pacemaker, aortic dissection or other major vascular complication, pleural effusions or other delayed complications related to continued congestive heart failure, and other late complications related to valve replacement including structural valve deterioration and failure, thrombosis, endocarditis, or paravalvular leak.  The patient has been  instructed to stop taking metformin and aspirin.  She will remain on all other medications tomorrow.  On the morning of surgery she will not take any of her regularly scheduled medications.    I spent in excess of 15 minutes during the conduct of this office consultation and >50% of this time involved direct face-to-face encounter with the patient for counseling and/or coordination of their care.   [name]. [name], MD 10/14/2017 10:58 AM

## 2017-10-15 ENCOUNTER — Encounter (HOSPITAL_COMMUNITY): Payer: Self-pay | Admitting: Certified Registered Nurse Anesthetist

## 2017-10-15 MED ORDER — VANCOMYCIN HCL 1000 MG IV SOLR
INTRAVENOUS | Status: AC
  Administered 2017-10-16: 1000 mL
  Filled 2017-10-15: qty 1000

## 2017-10-15 MED ORDER — KENNESTONE BLOOD CARDIOPLEGIA VIAL
13.0000 mL | Freq: Once | Status: DC
  Filled 2017-10-15: qty 1

## 2017-10-15 MED ORDER — EPINEPHRINE PF 1 MG/ML IJ SOLN
0.0000 ug/min | INTRAMUSCULAR | Status: DC
  Filled 2017-10-15: qty 4

## 2017-10-15 MED ORDER — SODIUM CHLORIDE 0.9 % IV SOLN
INTRAVENOUS | Status: AC
  Administered 2017-10-16: 3.1 [IU]/h via INTRAVENOUS
  Filled 2017-10-15: qty 1

## 2017-10-15 MED ORDER — TRANEXAMIC ACID 1000 MG/10ML IV SOLN
1.5000 mg/kg/h | INTRAVENOUS | Status: AC
  Administered 2017-10-16: 1.5 mg/kg/h via INTRAVENOUS
  Filled 2017-10-15: qty 25

## 2017-10-15 MED ORDER — SODIUM CHLORIDE 0.9 % IV SOLN
INTRAVENOUS | Status: DC
  Filled 2017-10-15: qty 30

## 2017-10-15 MED ORDER — NITROGLYCERIN IN D5W 200-5 MCG/ML-% IV SOLN
2.0000 ug/min | INTRAVENOUS | Status: DC
  Filled 2017-10-15: qty 250

## 2017-10-15 MED ORDER — TRANEXAMIC ACID (OHS) PUMP PRIME SOLUTION
2.0000 mg/kg | INTRAVENOUS | Status: DC
  Filled 2017-10-15: qty 1.93

## 2017-10-15 MED ORDER — POTASSIUM CHLORIDE 2 MEQ/ML IV SOLN
80.0000 meq | INTRAVENOUS | Status: DC
  Filled 2017-10-15: qty 40

## 2017-10-15 MED ORDER — PLASMA-LYTE 148 IV SOLN
INTRAVENOUS | Status: DC
  Filled 2017-10-15: qty 2.5

## 2017-10-15 MED ORDER — DEXTROSE 5 % IV SOLN
750.0000 mg | INTRAVENOUS | Status: DC
  Filled 2017-10-15: qty 750

## 2017-10-15 MED ORDER — MAGNESIUM SULFATE 50 % IJ SOLN
40.0000 meq | INTRAMUSCULAR | Status: DC
  Filled 2017-10-15: qty 9.85

## 2017-10-15 MED ORDER — SODIUM CHLORIDE 0.9 % IV SOLN
1250.0000 mg | INTRAVENOUS | Status: AC
  Administered 2017-10-16: 1250 mg via INTRAVENOUS
  Filled 2017-10-15: qty 1250

## 2017-10-15 MED ORDER — TRANEXAMIC ACID (OHS) BOLUS VIA INFUSION
15.0000 mg/kg | INTRAVENOUS | Status: AC
  Administered 2017-10-16: 1444.5 mg via INTRAVENOUS
  Filled 2017-10-15: qty 1445

## 2017-10-15 MED ORDER — SODIUM CHLORIDE 0.9 % IV SOLN
30.0000 ug/min | INTRAVENOUS | Status: AC
  Administered 2017-10-16: 15 ug/min via INTRAVENOUS
  Filled 2017-10-15: qty 2

## 2017-10-15 MED ORDER — DEXMEDETOMIDINE HCL IN NACL 400 MCG/100ML IV SOLN
0.1000 ug/kg/h | INTRAVENOUS | Status: AC
  Administered 2017-10-16: .3 ug/kg/h via INTRAVENOUS
  Filled 2017-10-15: qty 100

## 2017-10-15 MED ORDER — KENNESTONE BLOOD CARDIOPLEGIA (KBC) MANNITOL SYRINGE (20%, 32ML)
32.0000 mL | Freq: Once | INTRAVENOUS | Status: DC
  Filled 2017-10-15: qty 1

## 2017-10-15 MED ORDER — DEXTROSE 5 % IV SOLN
1.5000 g | INTRAVENOUS | Status: AC
  Administered 2017-10-16: .75 g via INTRAVENOUS
  Administered 2017-10-16: 1.5 g via INTRAVENOUS
  Filled 2017-10-15: qty 1.5

## 2017-10-15 MED ORDER — DOPAMINE-DEXTROSE 3.2-5 MG/ML-% IV SOLN
0.0000 ug/kg/min | INTRAVENOUS | Status: DC
  Filled 2017-10-15: qty 250

## 2017-10-15 NOTE — H&P (Signed)
[address]       [place] [address]             [phone]          CARDIOTHORACIC SURGERY HISTORY AND PHYSICAL EXAM  Referring Provider is [name], MD PCP is [name], FNP      Chief Complaint  Patient presents with  . Aortic Stenosis    Surgical eval for TAVR vs AVR, review all studies    HPI:  Patient is a 71 year old moderately obese female with history of aortic stenosis, hypertension, insulin-dependent type 2 diabetes mellitus without complications, and osteoporosis with been referred for surgical consultation to discuss treatment options for management of severe symptomatic aortic stenosis. The patient states that she has known about the presence of a heart murmur for several years. Transthoracic echocardiograms have demonstrated the presence of aortic stenosis, and she has been followed by Dr. Percival Spanish for several years. Over the past 2-3 years the patient has developed symptoms of exertional shortness of breath, decreased exercise tolerance, and a tendency to "give out". Follow-up echocardiogram performed 04/09/2017 revealed significant progression in the severity of aortic stenosis with peak velocity across the aortic valve measured 4.9 m/s corresponding to mean transvalvular gradient estimated 59 mmHg and the DVI only 0.14. Left ventricular systolic function remained normal with ejection fraction estimated 60-65%.  The patient was seen in follow-up by Dr. Percival Spanish on 07/17/2017 and scheduled for left and right heart catheterization that was performed by Dr. Burt Knack on 07/25/2017.  Catheterization revealed normal coronary arteries with normal right-sided heart pressures and left ventricular end-diastolic pressure.  CT angiography was performed and the patient was subsequently referred for elective surgical consultation.  Patient is married and lives with her husband in [place].  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 She has remained reasonably active and functionally independent. She complains that over the past 2-3 years she has developed worsening exercise tolerance, increased fatigue, exertional shortness of breath, and a tendency to "give out". She states that with ordinary activities she feels well but she is not able to keep up with chores as easily as he used to in the past. She denies any exertional chest pain or chest tightness. She denies any shortness of breath with low-level activities around the house. She has never had any resting shortness of breath, PND, orthopnea, or lower extremity edema.  Her mobility is not limited to any significant degree.  She states that her diabetes has been under good control and she checks her blood sugars regularly at home.    Past Medical History:  Diagnosis Date  . Aortic stenosis, severe 2015  . Cataract   . Diabetes mellitus without complication ([place])   . Dyspnea   . Hypertension   . Osteoporosis     Past Surgical History:  Procedure Laterality Date  . ABDOMINAL HYSTERECTOMY    . APPENDECTOMY    . FRACTURE SURGERY     shoulder  . TEE WITHOUT CARDIOVERSION N/A 04/18/2016   Procedure: TRANSESOPHAGEAL ECHOCARDIOGRAM (TEE);  Surgeon: Thayer Headings, MD;  Location: [facility];  Service: Cardiovascular;  Laterality: N/A;  . WRIST SURGERY      Family History  Problem Relation Age of Onset  . Stroke Father 63  . Diabetes Father   . Retinopathy of prematurity Father   . Heart failure Mother   . Heart attack Maternal Grandmother   .  Heart attack Paternal Grandmother   . Diabetes Paternal Aunt   . Diabetes Paternal Uncle     Social History Social History   Tobacco Use  . Smoking status: Never Smoker  . Smokeless tobacco: Never Used  Substance Use Topics  . Alcohol use: No    Alcohol/week: 0.0 oz  . Drug use: No    Prior to Admission medications   Medication Sig Start  Date End Date Taking? Authorizing Provider  acetaminophen (TYLENOL) 500 MG tablet Take 500 mg 2 (two) times daily as needed by mouth for moderate pain or headache.    Yes [provider]  alendronate (FOSAMAX) 70 MG tablet Take 1 tablet (70 mg total) by mouth every 7 (seven) days. Take with a full glass of water on an empty stomach. Patient not taking: Reported on 10/07/2017 04/19/17  Yes Hassell Done, Mary-Margaret, FNP  Ascorbic Acid (VITAMIN C) 1000 MG tablet Take 1,000 mg daily by mouth.   Yes [provider]  aspirin EC 81 MG tablet Take 81 mg by mouth daily.   Yes [provider]  beta carotene w/minerals (OCUVITE) tablet Take 1 tablet by mouth daily.   Yes [provider]  calcium carbonate (TUMS - DOSED IN MG ELEMENTAL CALCIUM) 500 MG chewable tablet Chew 1 tablet by mouth daily as needed for indigestion or heartburn.   Yes [provider]  calcium-vitamin D (OSCAL 500/200 D-3) 500-200 MG-UNIT tablet Take 1 tablet by mouth 2 (two) times daily. 12/10/16  Yes Cherre Robins, PharmD  cholecalciferol (VITAMIN D) 1000 UNITS tablet Take 1,000 Units by mouth daily.   Yes [provider]  fluticasone (FLONASE) 50 MCG/ACT nasal spray Place 1 spray into both nostrils daily as needed for allergies or rhinitis.   Yes [provider]  HUMALOG 100 UNIT/ML injection Inject 6-20 Units into the skin 3 (three) times daily with meals. Using sliding scale. 08/27/13  Yes [provider]  ibuprofen (ADVIL,MOTRIN) 200 MG tablet Take 400 mg 2 (two) times daily as needed by mouth for headache or moderate pain.    Yes [provider]  LANTUS 100 UNIT/ML injection Inject 35 Units into the skin at bedtime.  07/25/13  Yes [provider]  Magnesium 250 MG TABS Take 250 mg by mouth daily.    Yes [provider]  Multiple Vitamins-Minerals (MULTI-BETIC DIABETES) TABS Take 1 tablet daily by mouth.   Yes [provider]  Omega-3  Fatty Acids (FISH OIL) 1200 MG CAPS Take 1,200 mg daily by mouth.   Yes [provider]  pyridOXINE (VITAMIN B-6) 100 MG tablet Take 100 mg by mouth daily.   Yes [provider]  atorvastatin (LIPITOR) 10 MG tablet TAKE ONE-HALF TABLET BY  MOUTH DAILY AT [facility] 10/07/17   [name], FNP  diltiazem (CARDIZEM CD) 120 MG 24 hr capsule Take 1 capsule (120 mg total) daily by mouth. 10/07/17   Hassell Done, Mary-Margaret, FNP  hydrochlorothiazide (HYDRODIURIL) 25 MG tablet Take 1 tablet (25 mg total) daily by mouth. 10/07/17   Hassell Done, Mary-Margaret, FNP  lisinopril (PRINIVIL,ZESTRIL) 40 MG tablet Take 1 tablet (40 mg total) daily by mouth. 10/07/17   Hassell Done, Mary-Margaret, FNP  metFORMIN (GLUCOPHAGE-XR) 500 MG 24 hr tablet Take 1 tablet (500 mg total) 2 (two) times daily by mouth. 10/07/17   Hassell Done, Mary-Margaret, FNP  TRUETRACK TEST test strip Test four times daily. 08/19/13   [provider]    Allergies  Allergen Reactions  . Sulfa Antibiotics Nausea And Vomiting  Review of Systems:              General:                      normal appetite, decreased energy, no weight gain, no weight loss, no fever             Cardiac:                       no chest pain with exertion, no chest pain at rest, + SOB with exertion, no resting SOB, no PND, no orthopnea, no palpitations, no arrhythmia, no atrial fibrillation, no LE edema, no dizzy spells, no syncope             Respiratory:                 + exertional shortness of breath, no home oxygen, no productive cough, no dry cough, no bronchitis, no wheezing, no hemoptysis, no asthma, no pain with inspiration or cough, no sleep apnea, no CPAP at night             GI:                               no difficulty swallowing, no reflux, no frequent heartburn, no hiatal hernia, no abdominal pain, no constipation, no diarrhea, no hematochezia, no hematemesis, no melena             GU:                              no dysuria,  no  frequency, no urinary tract infection, no hematuria, no kidney stones, no kidney disease             Vascular:                     no pain suggestive of claudication, no pain in feet, no leg cramps, + varicose veins, no DVT, no non-healing foot ulcer             Neuro:                         no stroke, no TIA's, no seizures, no headaches, no temporary blindness one eye,  no slurred speech, no peripheral neuropathy, no chronic pain, no instability of gait, no memory/cognitive dysfunction             Musculoskeletal:         + arthritis, no joint swelling, no myalgias, no difficulty walking, normal mobility              Skin:                            no rash, no itching, no skin infections, no pressure sores or ulcerations             Psych:                         no anxiety, no depression, no nervousness, no unusual recent stress             Eyes:  no blurry vision, no floaters, no recent vision changes, + wears glasses or contacts             ENT:                            no hearing loss, no loose or painful teeth, no dentures, last saw dentist 04/08/2017             Hematologic:               no easy bruising, no abnormal bleeding, no clotting disorder, no frequent epistaxis             Endocrine:                   + diabetes, does check CBG's at home                                                       Physical Exam:              BP 139/76 (BP Location: Right Arm, Patient Position: Sitting, Cuff Size: Large)   Pulse 76   Resp 16   Ht 5\' 4"  (1.626 m)   Wt 212 lb (96.2 kg)   SpO2 96% Comment: RA  BMI 36.39 kg/m              General:                      Moderately obese,  well-appearing             HEENT:                       Unremarkable              Neck:                           no JVD, no bruits, no adenopathy              Chest:                          clear to auscultation, symmetrical breath sounds, no wheezes, no rhonchi              CV:                               RRR, grade IV/VI crescendo/decrescendo murmur heard best at RUSB,  no diastolic murmur             Abdomen:                    soft, non-tender, no masses              Extremities:                 warm, well-perfused, pulses diminished, no LE edema             Rectal/GU                   Deferred  Neuro:                         Grossly non-focal and symmetrical throughout             Skin:                            Clean and dry, no rashes, no breakdown   Diagnostic Tests:  Transthoracic Echocardiography  (Report amended )  Patient: Toni Cooper, Toni Cooper MR #: [id number] Study Date: 04/09/2017 Gender: F Age: 82 Height: 162.6 cm Weight: 96.6 kg BSA: 2.13 m^2 Pt. Status: Room:  ORDERING [name], MD REFERRING [name], MD SONOGRAPHER Blondell Reveal [place], [place] PERFORMING [facility] Outpatient ATTENDING Skeet Latch, MD  cc:  ------------------------------------------------------------------- LV EF: 60% - 65%  ------------------------------------------------------------------- Indications: (I35.0).  ------------------------------------------------------------------- History: PMH: Acquired from the patient and from the patient&'s chart. Dyspnea. Severe aortic stenosis. Risk factors: Hypertension. Diabetes mellitus. Obese.  ------------------------------------------------------------------- Study Conclusions  - Left ventricle: The cavity size was normal. There was moderate concentric hypertrophy. Systolic function was normal. The estimated ejection fraction was in the range of 60% to 65%. Wall motion was normal; there were no regional wall motion abnormalities. Doppler parameters are consistent with abnormal left ventricular relaxation (grade 1 diastolic dysfunction). Doppler parameters are consistent with high  ventricular filling pressure. - Aortic valve: Valve mobility was restricted. There was critical stenosis. There was no regurgitation. Peak velocity (S): 491 cm/s. Mean gradient (S): 59 mm Hg. - Mitral valve: Transvalvular velocity was within the normal range. There was no evidence for stenosis. There was trivial regurgitation. - Left atrium: The atrium was mildly dilated. - Right ventricle: The cavity size was normal. Wall thickness was normal. Systolic function was normal. - Tricuspid valve: There was trivial regurgitation. - Pulmonary arteries: Systolic pressure was within the normal range. PA peak pressure: 27 mm Hg (S).  ------------------------------------------------------------------- Study data: Comparison was made to the study of 01/24/2016. Study status: Routine. Procedure: The patient reported no pain pre or post test. Transthoracic echocardiography for left ventricular function evaluation and for assessment of valvular function. Image quality was adequate. Study completion: There were no complications. Transthoracic echocardiography. M-mode, complete 2D, spectral Doppler, and color Doppler. Birthdate: Patient birthdate: September 12, 1946. Age: Patient is 71 yr old. Sex: Gender: female. BMI: 36.5 kg/m^2. Blood pressure: 124/62 Patient status: Outpatient. Study date: Study date: 04/09/2017. Study time: 10:36 AM. Location:  Site [facility]  -------------------------------------------------------------------  ------------------------------------------------------------------- Left ventricle: The cavity size was normal. There was moderate concentric hypertrophy. Systolic function was normal. The estimated ejection fraction was in the range of 60% to 65%. Wall motion was normal; there were no regional wall motion abnormalities. Doppler parameters are consistent with abnormal left ventricular relaxation (grade 1 diastolic  dysfunction). Doppler parameters are consistent with high ventricular filling pressure.  ------------------------------------------------------------------- Aortic valve: Trileaflet; severely thickened, severely calcified leaflets. Valve mobility was restricted. Doppler: There was critical stenosis. There was no regurgitation. VTI ratio of LVOT to aortic valve: 0.14. Valve area (VTI): 0.47 cm^2. Indexed valve area (VTI): 0.22 cm^2/m^2. Peak velocity ratio of LVOT to aortic valve: 0.17. Valve area (Vmax): 0.58 cm^2. Indexed valve area (Vmax): 0.27 cm^2/m^2. Mean velocity ratio of LVOT to aortic valve: 0.15. Valve area (Vmean): 0.53 cm^2. Indexed valve area (Vmean): 0.25 cm^2/m^2. Mean gradient (S): 59 mm Hg. Peak gradient (S): 96 mm Hg.  ------------------------------------------------------------------- Aorta: Aortic root: The aortic root was normal  in size.  ------------------------------------------------------------------- Mitral valve: Structurally normal valve. Mobility was not restricted. Doppler: Transvalvular velocity was within the normal range. There was no evidence for stenosis. There was trivial regurgitation. Peak gradient (D): 2 mm Hg.  ------------------------------------------------------------------- Left atrium: The atrium was mildly dilated.  ------------------------------------------------------------------- Right ventricle: The cavity size was normal. Wall thickness was normal. Systolic function was normal.  ------------------------------------------------------------------- Pulmonic valve: Structurally normal valve. Cusp separation was normal. Doppler: Transvalvular velocity was within the normal range. There was no evidence for stenosis. There was no regurgitation.  ------------------------------------------------------------------- Tricuspid valve: Structurally normal valve. Doppler: Transvalvular velocity was  within the normal range. There was trivial regurgitation.  ------------------------------------------------------------------- Pulmonary artery: The main pulmonary artery was normal-sized. Systolic pressure was within the normal range.  ------------------------------------------------------------------- Right atrium: The atrium was normal in size.  ------------------------------------------------------------------- Pericardium: There was no pericardial effusion.  ------------------------------------------------------------------- Systemic veins: Inferior vena cava: The vessel was normal in size. The respirophasic diameter changes were in the normal range (= 50%), consistent with normal central venous pressure.  ------------------------------------------------------------------- Measurements  Left ventricle Value Reference LV ID, ED, PLAX chordal 48.4 mm 43 - 52 LV ID, ES, PLAX chordal 32.9 mm 23 - 38 LV fx shortening, PLAX chordal 32 % >=29 LV PW thickness, ED 13.8 mm --------- IVS/LV PW ratio, ED 1.07 <=1.3 Stroke volume, 2D 58 ml --------- Stroke volume/bsa, 2D 27 ml/m^2 --------- LV ejection fraction, 1-p A4C 61 % --------- LV end-diastolic volume, 2-p 78 ml --------- LV end-systolic volume, 2-p 28 ml --------- LV ejection fraction, 2-p 64 % --------- Stroke volume, 2-p 50 ml --------- LV end-diastolic volume/bsa, 2-p 37 ml/m^2 --------- LV end-systolic volume/bsa, 2-p 13 ml/m^2 --------- Stroke volume/bsa, 2-p 23.4 ml/m^2 --------- LV e&', lateral  10 cm/s --------- LV E/e&', lateral 7.31 --------- LV e&', medial 3.9 cm/s --------- LV E/e&', medial 18.74 --------- LV e&', average 6.95 cm/s --------- LV E/e&', average 10.52 ---------  Ventricular septum Value Reference IVS thickness, ED 14.7 mm ---------  LVOT Value Reference LVOT ID, S 21 mm --------- LVOT area 3.46 cm^2 --------- LVOT ID 21 mm --------- LVOT peak velocity, S 82.7 cm/s --------- LVOT mean velocity, S 55.8 cm/s --------- LVOT VTI, S 16.9 cm --------- LVOT peak gradient, S 3 mm Hg --------- Stroke volume (SV), LVOT DP 58.5 ml --------- Stroke index (SV/bsa), LVOT DP 27.4 ml/m^2 ---------  Aortic valve Value Reference Aortic valve peak velocity, S 491 cm/s --------- Aortic valve mean velocity, S 362 cm/s --------- Aortic valve VTI, S 125 cm --------- Aortic mean gradient, S 59 mm Hg --------- Aortic peak gradient, S 96 mm Hg --------- VTI ratio, LVOT/AV 0.14 --------- Aortic valve area, VTI 0.47 cm^2 --------- Aortic valve area/bsa, VTI 0.22 cm^2/m^2 --------- Velocity ratio, peak, LVOT/AV 0.17 --------- Aortic valve area,  peak velocity 0.58 cm^2 --------- Aortic valve area/bsa, peak 0.27 cm^2/m^2 --------- velocity Velocity ratio, mean, LVOT/AV 0.15 --------- Aortic valve area, mean velocity 0.53 cm^2 --------- Aortic valve area/bsa, mean 0.25 cm^2/m^2 --------- velocity  Aorta Value Reference Aortic root ID, ED 32 mm --------- Ascending aorta ID, A-P, S 33 mm ---------  Left atrium Value Reference LA ID, A-P, ES 41 mm --------- LA ID/bsa, A-P 1.92 cm/m^2 <=2.2 LA volume, S 60 ml --------- LA volume/bsa, S 28.1 ml/m^2 --------- LA volume, ES, 1-p A4C 61 ml --------- LA volume/bsa, ES, 1-p A4C 28.6 ml/m^2 --------- LA volume, ES, 1-p A2C 59 ml --------- LA volume/bsa, ES, 1-p A2C 27.7 ml/m^2 ---------  Mitral valve Value Reference Mitral E-wave peak velocity  73.1 cm/s --------- Mitral A-wave peak velocity 116 cm/s --------- Mitral deceleration time (H) 405 ms 150 - 230 Mitral peak gradient, D 2 mm Hg --------- Mitral E/A ratio, peak 0.6 ---------  Pulmonary arteries Value Reference PA pressure, S, DP 27 mm Hg <=30  Tricuspid valve Value Reference Tricuspid regurg peak velocity 246 cm/s --------- Tricuspid peak RV-RA gradient 24 mm Hg  ---------  Systemic veins Value Reference Estimated CVP 3 mm Hg ---------  Right ventricle Value Reference RV pressure, S, DP 27 mm Hg <=30 RV s&', lateral, S 12.8 cm/s ---------  Legend: (L) and (H) mark values outside specified reference range.  ------------------------------------------------------------------- Neena Rhymes, MD [id number]:20:36   RIGHT HEART CATH AND CORONARY ANGIOGRAPHY  Conclusion   1. Widely patent, normal coronary arteries 2. Normal right heart hemodynamics and normal LVEDP 3. Severely calcified and restricted aortic valve by plain fluoroscopy with known severe aortic stenosis by noninvasive assessment.  Indications   Severe aortic stenosis [I35.0 (ICD-10-CM)]  Procedural Details/Technique   Technical Details INDICATION: Severe aortic stenosis. Pre-operative study.   PROCEDURAL DETAILS: There was an indwelling IV in a right antecubital vein. Using normal sterile technique, the IV was changed out for a 5 Fr brachial sheath over a 0.018 inch wire. The right wrist was then prepped, draped, and anesthetized with 1% lidocaine. Using the modified Seldinger technique a 5/6 French Slender sheath was placed in the right radial artery. Intra-arterial verapamil was administered through the radial artery sheath. IV heparin was administered after a JR4 catheter was advanced into the central aorta. A Swan-Ganz catheter was used for the right heart catheterization. Standard protocol was followed for recording of right heart pressures and sampling of oxygen saturations. Fick cardiac output was calculated. Standard Judkins catheters were used for selective coronary angiography. The aortic valve was not crossed because of known critical aortic stenosis. There were no immediate  procedural complications. The patient was transferred to the post catheterization recovery area for further monitoring.     Estimated blood loss <50 mL.  During this procedure the patient was administered the following to achieve and maintain moderate conscious sedation: Versed 3 mg, Fentanyl 50 mcg, while the patient's heart rate, blood pressure, and oxygen saturation were continuously monitored. The period of conscious sedation was 37 minutes, of which I was present face-to-face 100% of this time.    Coronary Findings   Dominance: Right  Left Main  Vessel is angiographically normal.  Left Anterior Descending  Vessel is angiographically normal.  First Diagonal Branch  Vessel is angiographically normal.  Left Circumflex  Vessel is angiographically normal.  First Obtuse Marginal Branch  Vessel is angiographically normal.  Second Obtuse Marginal Branch  Vessel is small in size.  Right Coronary Artery  Vessel is angiographically normal. This is a large, dominant vessel. The PDA and PLA branches are widely patent. The vessel is smooth throughout its course without significant stenosis.  Coronary Diagrams   Diagnostic Diagram       Implants        No implant documentation for this case.  MERGE Images   Show images for CARDIAC CATHETERIZATION   Link to Procedure Log   Procedure Log    Hemo Data    Most Recent Value  Fick Cardiac Output 6.91 L/min  Fick Cardiac Output Index 3.46 (L/min)/BSA  RA A Wave 7 mmHg  RA V Wave 4 mmHg  RA Mean 4 mmHg  RV Systolic Pressure 26 mmHg  RV Diastolic Pressure 0 mmHg  RV EDP 6 mmHg  PA Systolic Pressure 29 mmHg  PA Diastolic Pressure 6 mmHg  PA Mean 18 mmHg  PW A Wave 15 mmHg  PW V Wave 11 mmHg  PW Mean 10 mmHg  AO Systolic Pressure 829 mmHg  AO Diastolic Pressure 60 mmHg  AO Mean 88 mmHg  QP/QS 1  TPVR Index 5.21 HRUI  TSVR Index 25.45 HRUI  PVR SVR Ratio 0.1  TPVR/TSVR Ratio 0.2    Cardiac TAVR  CT  TECHNIQUE: The patient was scanned on a [employer]. A 120 kV retrospective scan was triggered in the descending thoracic aorta at 111 HU's. Gantry rotation speed was 250 msecs and collimation was .6 mm. No beta blockade or nitro were given. The 3D data set was reconstructed in 5% intervals of the R-R cycle. Systolic and diastolic phases were analyzed on a dedicated work station using MPR, MIP and VRT modes. The patient received 80 cc of contrast.  FINDINGS: Aortic Valve: Functionally bicuspid, severely thickened and calcified aortic valve with severely restricted leaflet opening. Calcifications are asymmetric predominantly in the non-coronary and right coronary cusps. Minimal calcifications are extending into the LVOT.  Aorta: Normal size, minimal calcifications, no dissection.  Sinotubular Junction: 28 x 24 mm  Ascending Thoracic Aorta: 31 x 31 mm  Aortic Arch: 27 x 24 mm  Descending Thoracic Aorta: 24 x 24 mm  Sinus of Valsalva Measurements:  Non-coronary: 30 mm  Right -coronary: 29 mm  Left -coronary: 32 mm  Sinus Height:  Non-coronary: 18 mm  Right -coronary: 18 mm  Left -coronary: 21 mm  Coronary Artery Height above Annulus:  Left Main: 14 mm  Right Coronary: 12 mm  Virtual Basal Annulus Measurements:  Maximum/Minimum Diameter: 25 mm x 22.1 mm  Average diameter: 22.7 mm  Perimeter: 72.6 mm  Area: 405 mm2  Coronary Arteries: Normal origin.  Optimum Fluoroscopic Angle for Delivery: LAO 0 CAU 0  IMPRESSION: 1. Functionally bicuspid, severely thickened and calcified aortic valve with severely restricted leaflet opening. Calcifications are asymmetric predominantly in the non-coronary and right coronary cusps. Minimal calcifications are extending into the LVOT. Annular measurements suitable for a 23 mm Edwards-SAPIEN 3 valve or a 26 mm Medtronic Evolut valve.  2. Sufficient annulus to  coronary distance.  3. Optimum Fluoroscopic Angle for Delivery: LAO 0 CAU 0  4. No thrombus in the left atrial appendage.  5. Dilated pulmonary artery measuring 33 mm suggestive of pulmonary hypertension.  Ena Dawley   Electronically Signed By: Ena Dawley On: 09/09/2017 19:22   CT ANGIOGRAPHY CHEST, ABDOMEN AND PELVIS  TECHNIQUE: Multidetector CT imaging through the chest, abdomen and pelvis was performed using the standard protocol during bolus administration of intravenous contrast. Multiplanar reconstructed images and MIPs were obtained and reviewed to evaluate the vascular anatomy.  CONTRAST: 80 mL of Isovue 370.  COMPARISON: No priors.  FINDINGS: CTA CHEST FINDINGS  Cardiovascular: Heart size is borderline enlarged. There is no significant pericardial fluid, thickening or pericardial calcification. Aortic atherosclerosis (mild), without evidence of aneurysm or dissection in the thoracic vasculature. No definite coronary artery calcifications. Severe thickening and calcification of the aortic valve, compatible with the reported clinical history of aortic stenosis.  Mediastinum/Lymph Nodes: No pathologically enlarged mediastinal or hilar lymph nodes. Moderate-sized hiatal hernia. No axillary lymphadenopathy.  Lungs/Pleura: Mild linear scarring in the right upper lobe anteriorly and in the inferior segment of the lingula. No acute consolidative airspace disease. No pleural effusions. No suspicious appearing pulmonary nodules or masses.  Musculoskeletal/Soft Tissues: Chronic appearing compression fracture of T8 with approximately 25% loss of anterior vertebral body height. There are no aggressive appearing lytic or blastic lesions noted in the visualized portions of the skeleton. Right shoulder arthroplasty incompletely imaged.  CTA ABDOMEN AND PELVIS FINDINGS  Hepatobiliary: No cystic or solid hepatic lesions. No intra  or extrahepatic biliary ductal dilatation. Gallbladder is normal in appearance.  Pancreas: Severe fatty atrophy of the pancreas which is nearly completely replaced with fat. No pancreatic mass. No pancreatic ductal dilatation. No pancreatic or peripancreatic fluid or inflammatory changes.  Spleen: Unremarkable.  Adrenals/Urinary Tract: Bilateral kidneys and bilateral adrenal glands are normal in appearance. There is no hydroureteronephrosis. Urinary bladder is normal in appearance.  Stomach/Bowel: Normal appearance of the intraabdominal portion of the stomach. There is no pathologic dilatation of small bowel or colon. A few scattered colonic diverticulae are noted, without surrounding inflammatory changes to suggest acute diverticulitis at this time. The appendix is not confidently identified and may be surgically absent. Regardless, there are no inflammatory changes noted adjacent to the cecum to suggest the presence of an acute appendicitis at this time.  Vascular/Lymphatic: Aortic atherosclerosis with vascular findings and measurements pertinent to potential TAVR procedure, as detailed below. Celiac axis, superior mesenteric artery and inferior mesenteric artery are all widely patent without hemodynamically significant stenosis. Single renal arteries bilaterally are both widely patent without hemodynamically significant stenosis. No lymphadenopathy noted in the abdomen or pelvis.  Reproductive: Status posthysterectomy. Ovaries are not confidently identified may be surgically absent or atrophic.  Other: No significant volume of ascites. No pneumoperitoneum. Tiny umbilical hernia containing only omental fat.  Musculoskeletal: There are no aggressive appearing lytic or blastic lesions noted in the visualized portions of the skeleton.  VASCULAR MEASUREMENTS PERTINENT TO TAVR:  AORTA:  Minimal Aortic Diameter - 15 x 15 mm  Severity of Aortic Calcification -  mild  RIGHT PELVIS:  Right Common Iliac Artery -  Minimal Diameter - 10.5 x 9.6 mm  Tortuosity - mild  Calcification - none  Right External Iliac Artery -  Minimal Diameter - 7.1 x 7.6 mm  Tortuosity - mild  Calcification - none  Right Common Femoral Artery -  Minimal Diameter - 7.6 x 6.8 mm  Tortuosity - mild  Calcification - none  LEFT PELVIS:  Left Common Iliac Artery -  Minimal Diameter - 10.4 x 10.0 mm  Tortuosity - mild  Calcification - none  Left External Iliac Artery -  Minimal Diameter - 7.6 x 7.9 mm  Tortuosity - mild  Calcification - none  Left Common Femoral Artery -  Minimal Diameter - 6.9 x 7.6 mm  Tortuosity - mild  Calcification - none  Review of the MIP images confirms the above findings.  IMPRESSION: 1. Vascular findings and measurements pertinent to potential TAVR procedure, as detailed above. This patient does have suitable pelvic arterial access bilaterally. 2. Severe thickening calcification of the aortic valve, compatible with the reported clinical history of severe aortic stenosis. 3. Aortic atherosclerosis. 4. Severe fatty atrophy of the pancreas. 5. Moderate sized hiatal hernia. 6. Additional incidental findings, as above. Aortic Atherosclerosis (ICD10-I70.0).   Electronically Signed By: Vinnie Langton M.D. On: 09/06/2017 14:50   STS Risk Calculator Procedure: AV Replacement  Risk of Mortality: 2.815%  Morbidity or Mortality: 16.574%  Long Length of Stay: 7.716%  Short Length of Stay: 31.232%  Permanent Stroke: 1.216%  Prolonged Ventilation: 11.203%  DSW Infection: 0.488%  Renal Failure: 5.027%  Reoperation: 6.381%  Cardiac TAVR CT  TECHNIQUE: The patient was scanned on a [employer]. A 120 kV retrospective scan was triggered in the descending thoracic aorta at 111 HU's. Gantry rotation speed was 250 msecs and collimation was .6 mm. No beta  blockade or nitro were given. The 3D data set was reconstructed in 5% intervals of the R-R cycle. Systolic and diastolic phases were analyzed on a dedicated work station using MPR, MIP and VRT modes. The patient received 80 cc of contrast.  FINDINGS: Aortic Valve: Functionally bicuspid, severely thickened and calcified aortic valve with severely restricted leaflet opening. Calcifications are asymmetric predominantly in the non-coronary and right coronary cusps. Minimal calcifications are extending into the LVOT.  Aorta: Normal size, minimal calcifications, no dissection.  Sinotubular Junction: 28 x 24 mm  Ascending Thoracic Aorta: 31 x 31 mm  Aortic Arch: 27 x 24 mm  Descending Thoracic Aorta: 24 x 24 mm  Sinus of Valsalva Measurements:  Non-coronary: 30 mm  Right -coronary: 29 mm  Left -coronary: 32 mm  Sinus Height:  Non-coronary: 18 mm  Right -coronary: 18 mm  Left -coronary: 21 mm  Coronary Artery Height above Annulus:  Left Main: 14 mm  Right Coronary: 12 mm  Virtual Basal Annulus Measurements:  Maximum/Minimum Diameter: 25 mm x 22.1 mm  Average diameter: 22.7 mm  Perimeter: 72.6 mm  Area: 405 mm2  Coronary Arteries: Normal origin.  Optimum Fluoroscopic Angle for Delivery: LAO 0 CAU 0  IMPRESSION: 1. Functionally bicuspid, severely thickened and calcified aortic valve with severely restricted leaflet opening. Calcifications are asymmetric predominantly in the non-coronary and right coronary cusps. Minimal calcifications are extending into the LVOT. Annular measurements suitable for a 23 mm Edwards-SAPIEN 3 valve or a 26 mm Medtronic Evolut valve.  2. Sufficient annulus to coronary distance.  3. Optimum Fluoroscopic Angle for Delivery: LAO 0 CAU 0  4. No thrombus in the left atrial appendage.  5. Dilated pulmonary artery measuring 33 mm suggestive of pulmonary hypertension.  Ena Dawley   Electronically Signed By: Ena Dawley On: 09/09/2017 19:22  CT ANGIOGRAPHY CHEST, ABDOMEN AND PELVIS  TECHNIQUE: Multidetector CT imaging through the chest, abdomen and pelvis was performed using the standard protocol during bolus administration of intravenous contrast. Multiplanar reconstructed images and MIPs were obtained and reviewed to evaluate the vascular anatomy.  CONTRAST: 80 mL of Isovue 370.  COMPARISON: No priors.  FINDINGS: CTA CHEST FINDINGS  Cardiovascular: Heart size is borderline enlarged. There is no significant pericardial fluid, thickening or pericardial calcification. Aortic atherosclerosis (mild), without evidence of aneurysm or dissection in the thoracic vasculature. No definite coronary artery calcifications. Severe thickening and calcification of the aortic valve, compatible with the reported clinical history of aortic stenosis.  Mediastinum/Lymph Nodes: No pathologically enlarged mediastinal or hilar lymph nodes. Moderate-sized hiatal hernia. No axillary lymphadenopathy.  Lungs/Pleura: Mild linear scarring in the right upper lobe anteriorly and in the inferior segment of the lingula. No acute consolidative airspace disease. No pleural effusions. No suspicious appearing pulmonary nodules or masses.  Musculoskeletal/Soft Tissues: Chronic appearing compression fracture of T8 with approximately 25% loss of anterior vertebral body height. There are no aggressive appearing lytic or blastic lesions noted in the visualized portions of the skeleton. Right shoulder arthroplasty incompletely imaged.  CTA ABDOMEN AND PELVIS FINDINGS  Hepatobiliary: No cystic or solid hepatic lesions. No intra or extrahepatic biliary ductal dilatation. Gallbladder is normal in appearance.  Pancreas: Severe fatty atrophy of the pancreas which is nearly completely replaced with fat. No  pancreatic mass. No pancreatic ductal dilatation.  No pancreatic or peripancreatic fluid or inflammatory changes.  Spleen: Unremarkable.  Adrenals/Urinary Tract: Bilateral kidneys and bilateral adrenal glands are normal in appearance. There is no hydroureteronephrosis. Urinary bladder is normal in appearance.  Stomach/Bowel: Normal appearance of the intraabdominal portion of the stomach. There is no pathologic dilatation of small bowel or colon. A few scattered colonic diverticulae are noted, without surrounding inflammatory changes to suggest acute diverticulitis at this time. The appendix is not confidently identified and may be surgically absent. Regardless, there are no inflammatory changes noted adjacent to the cecum to suggest the presence of an acute appendicitis at this time.  Vascular/Lymphatic: Aortic atherosclerosis with vascular findings and measurements pertinent to potential TAVR procedure, as detailed below. Celiac axis, superior mesenteric artery and inferior mesenteric artery are all widely patent without hemodynamically significant stenosis. Single renal arteries bilaterally are both widely patent without hemodynamically significant stenosis. No lymphadenopathy noted in the abdomen or pelvis.  Reproductive: Status posthysterectomy. Ovaries are not confidently identified may be surgically absent or atrophic.  Other: No significant volume of ascites. No pneumoperitoneum. Tiny umbilical hernia containing only omental fat.  Musculoskeletal: There are no aggressive appearing lytic or blastic lesions noted in the visualized portions of the skeleton.  VASCULAR MEASUREMENTS PERTINENT TO TAVR:  AORTA:  Minimal Aortic Diameter - 15 x 15 mm  Severity of Aortic Calcification - mild  RIGHT PELVIS:  Right Common Iliac Artery -  Minimal Diameter - 10.5 x 9.6 mm  Tortuosity - mild  Calcification - none  Right External Iliac Artery -  Minimal Diameter - 7.1 x 7.6 mm  Tortuosity -  mild  Calcification - none  Right Common Femoral Artery -  Minimal Diameter - 7.6 x 6.8 mm  Tortuosity - mild  Calcification - none  LEFT PELVIS:  Left Common Iliac Artery -  Minimal Diameter - 10.4 x 10.0 mm  Tortuosity - mild  Calcification - none  Left External Iliac Artery -  Minimal Diameter - 7.6 x 7.9 mm  Tortuosity - mild  Calcification - none  Left Common Femoral Artery -  Minimal Diameter - 6.9 x 7.6 mm  Tortuosity - mild  Calcification - none  Review of the MIP images confirms the above findings.  IMPRESSION: 1. Vascular findings and measurements pertinent to potential TAVR procedure, as detailed above. This patient does have suitable pelvic arterial access bilaterally. 2. Severe thickening calcification of the aortic valve, compatible with the reported clinical history of severe aortic stenosis. 3. Aortic atherosclerosis. 4. Severe fatty atrophy of the pancreas. 5. Moderate sized hiatal hernia. 6. Additional incidental findings, as above. Aortic Atherosclerosis (ICD10-I70.0).   Electronically Signed By: Vinnie Langton M.D. On: 09/06/2017 14:50     Impression:  Patient has stage D severe symptomatic aortic stenosis. She presents with slow gradual progressive history of exertional shortness of breath and fatigue consistent with chronic diastolic congestive heart failure, [place] Heart Association functional class II. I have personally reviewed the patient's most recent transthoracic echocardiogram, diagnostic cardiac catheterization, and CT angiograms. Echocardiogram demonstrates what appears to be functioning bicuspid aortic valve with severe thickening and restricted leaflet mobility involving all 3 leaflets. Peak velocity across the aortic valve measured 4.9 m/s corresponding to mean transvalvular gradient estimated 59 mmHg. Left ventricular systolic function remains normal. Diagnostic cardiac catheterization  is notable for the absence of significant coronary artery disease. CT angiography reveals no sign of aneurysmal enlargementnor significant calcification of the aortic root. The patient's aortic  annulus appears to be adequate size for placement of a 21 mm stented bioprosthetic tissue valve without need for root enlargement. Risks associated with conventional surgical aortic valve replacement are likely relatively low. Patient might be reasonable candidate for minimally invasive approach for surgery, although I would favor partial upper sternotomy rather than mini thoracotomy due to the patient's body habitus and the position of the heart within the chest.  CT angiography reveals no complicating features.  Aortic valve is functionally bicuspid and severely stenotic with heavy calcification in the noncoronary and right coronary leaflet.  The aortic annulus is relatively small in size but should be suitable for implantation of at least a 21 mm stented bioprosthetic tissue valve, possibly a 23 mm valve.    Plan:  The patient and her husband were again counseled at length regarding treatment alternatives for management of severe aortic stenosis including continued medical therapy versus proceeding with aortic valve replacement in the near future.  The natural history of aortic stenosis was reviewed, as was long term prognosis with medical therapy alone.  Surgical options were discussed at length including conventional surgical aortic valve replacement through either a full median sternotomy or using minimally invasive techniques.  Other alternatives including rapid-deployment bioprosthetic tissue valve replacement, transcatheter aortic valve replacement, patch enlargement of the aortic root, stentless porcine aortic root replacement, valve repair, the Ross autograft procedure, and homograft aortic root replacement were discussed.  Discussion was held comparing the relative risks of mechanical valve replacement  with need for lifelong anticoagulation versus use of a bioprosthetic tissue valve and the associated potential for late structural valve deterioration and failure.  This discussion was placed in the context of the patient's particular circumstances, and as a result the patient specifically requests that their valve be replaced using a bioprosthetic tissue valve .  The potential advantages and disadvantages associated with use of a rapid-deployment bioprosthetic aortic valve were discussed, including the risks of paravalvular leak, need for permanent pacemaker placement, and expectations for long-term durability. Alternative surgical approaches have been discussed including a comparison between conventional sternotomy and minimally-invasive techniques.  The relative risks and benefits of each have been reviewed as they pertain to the patient's specific circumstances, and all of their questions have been addressed.  Specific risks potentially related to the minimally-invasive approach were discussed at length, including but not limited to risk of conversion to full or partial sternotomy, aortic dissection or other major vascular complication, unilateral acute lung injury or pulmonary edema, phrenic nerve dysfunction or paralysis, rib fracture, chronic pain, lung hernia, or lymphocele.   The patient understands and accepts all potential associated risks of surgery including but not limited to risk of death, stroke, myocardial infarction, congestive heart failure, respiratory failure, renal failure, pneumonia, bleeding requiring blood transfusion and or reexploration, arrhythmia, heart block or bradycardia requiring permanent pacemaker, aortic dissection or other major vascular complication, pleural effusions or other delayed complications related to continued congestive heart failure, and other late complications related to valve replacement including structural valve deterioration and failure, thrombosis,  endocarditis, or paravalvular leak.  The patient has been instructed to stop taking metformin and aspirin.  She will remain on all other medications tomorrow.  On the morning of surgery she will not take any of her regularly scheduled medications.     [name]. [name], MD 10/14/2017 10:58 AM

## 2017-10-16 ENCOUNTER — Inpatient Hospital Stay (HOSPITAL_COMMUNITY): Payer: Medicare Other

## 2017-10-16 ENCOUNTER — Other Ambulatory Visit: Payer: Self-pay

## 2017-10-16 ENCOUNTER — Encounter (HOSPITAL_COMMUNITY)
Admission: RE | Disposition: A | Discharge status: Home / Self Care | Payer: Self-pay | Source: Ambulatory Visit | Attending: Thoracic Surgery (Cardiothoracic Vascular Surgery)

## 2017-10-16 ENCOUNTER — Inpatient Hospital Stay (HOSPITAL_COMMUNITY): Payer: Medicare Other | Admitting: Certified Registered Nurse Anesthetist

## 2017-10-16 ENCOUNTER — Inpatient Hospital Stay (HOSPITAL_COMMUNITY)
Admission: RE | Admit: 2017-10-16 | Discharge: 2017-10-21 | DRG: 220 | Disposition: A | Discharge status: Home / Self Care | Payer: Medicare Other | Source: Ambulatory Visit | Attending: Thoracic Surgery (Cardiothoracic Vascular Surgery) | Admitting: Thoracic Surgery (Cardiothoracic Vascular Surgery)

## 2017-10-16 ENCOUNTER — Encounter (HOSPITAL_COMMUNITY): Payer: Self-pay | Admitting: *Deleted

## 2017-10-16 ENCOUNTER — Ambulatory Visit (HOSPITAL_COMMUNITY)
Admission: RE | Admit: 2017-10-16 | Discharge: 2017-10-16 | Disposition: A | Discharge status: Home / Self Care | Payer: Medicare Other | Source: Ambulatory Visit | Attending: Thoracic Surgery (Cardiothoracic Vascular Surgery) | Admitting: Thoracic Surgery (Cardiothoracic Vascular Surgery)

## 2017-10-16 DIAGNOSIS — K449 Diaphragmatic hernia without obstruction or gangrene: Secondary | ICD-10-CM | POA: Diagnosis present

## 2017-10-16 DIAGNOSIS — E119 Type 2 diabetes mellitus without complications: Secondary | ICD-10-CM

## 2017-10-16 DIAGNOSIS — I700 Atherosclerosis of aorta: Secondary | ICD-10-CM | POA: Diagnosis present

## 2017-10-16 DIAGNOSIS — Z7982 Long term (current) use of aspirin: Secondary | ICD-10-CM | POA: Diagnosis not present

## 2017-10-16 DIAGNOSIS — E669 Obesity, unspecified: Secondary | ICD-10-CM | POA: Diagnosis present

## 2017-10-16 DIAGNOSIS — I350 Nonrheumatic aortic (valve) stenosis: Principal | ICD-10-CM | POA: Diagnosis present

## 2017-10-16 DIAGNOSIS — Z90710 Acquired absence of both cervix and uterus: Secondary | ICD-10-CM | POA: Diagnosis not present

## 2017-10-16 DIAGNOSIS — R490 Dysphonia: Secondary | ICD-10-CM | POA: Diagnosis not present

## 2017-10-16 DIAGNOSIS — M810 Age-related osteoporosis without current pathological fracture: Secondary | ICD-10-CM | POA: Diagnosis present

## 2017-10-16 DIAGNOSIS — Z7983 Long term (current) use of bisphosphonates: Secondary | ICD-10-CM | POA: Diagnosis not present

## 2017-10-16 DIAGNOSIS — Z794 Long term (current) use of insulin: Secondary | ICD-10-CM | POA: Diagnosis not present

## 2017-10-16 DIAGNOSIS — I440 Atrioventricular block, first degree: Secondary | ICD-10-CM | POA: Diagnosis not present

## 2017-10-16 DIAGNOSIS — Z6839 Body mass index (BMI) 39.0-39.9, adult: Secondary | ICD-10-CM

## 2017-10-16 DIAGNOSIS — I447 Left bundle-branch block, unspecified: Secondary | ICD-10-CM | POA: Diagnosis not present

## 2017-10-16 DIAGNOSIS — I10 Essential (primary) hypertension: Secondary | ICD-10-CM | POA: Diagnosis present

## 2017-10-16 DIAGNOSIS — Z953 Presence of xenogenic heart valve: Secondary | ICD-10-CM

## 2017-10-16 DIAGNOSIS — Z8249 Family history of ischemic heart disease and other diseases of the circulatory system: Secondary | ICD-10-CM

## 2017-10-16 DIAGNOSIS — Z7951 Long term (current) use of inhaled steroids: Secondary | ICD-10-CM | POA: Diagnosis not present

## 2017-10-16 DIAGNOSIS — D62 Acute posthemorrhagic anemia: Secondary | ICD-10-CM | POA: Diagnosis not present

## 2017-10-16 DIAGNOSIS — E1136 Type 2 diabetes mellitus with diabetic cataract: Secondary | ICD-10-CM | POA: Diagnosis present

## 2017-10-16 DIAGNOSIS — Z881 Allergy status to other antibiotic agents status: Secondary | ICD-10-CM

## 2017-10-16 DIAGNOSIS — Z833 Family history of diabetes mellitus: Secondary | ICD-10-CM

## 2017-10-16 DIAGNOSIS — I110 Hypertensive heart disease with heart failure: Secondary | ICD-10-CM | POA: Diagnosis present

## 2017-10-16 DIAGNOSIS — Z79899 Other long term (current) drug therapy: Secondary | ICD-10-CM

## 2017-10-16 DIAGNOSIS — I5032 Chronic diastolic (congestive) heart failure: Secondary | ICD-10-CM | POA: Diagnosis present

## 2017-10-16 DIAGNOSIS — K8689 Other specified diseases of pancreas: Secondary | ICD-10-CM | POA: Diagnosis present

## 2017-10-16 DIAGNOSIS — D696 Thrombocytopenia, unspecified: Secondary | ICD-10-CM | POA: Diagnosis not present

## 2017-10-16 DIAGNOSIS — J9811 Atelectasis: Secondary | ICD-10-CM

## 2017-10-16 HISTORY — DX: Obesity, unspecified: E66.9

## 2017-10-16 HISTORY — PX: TEE WITHOUT CARDIOVERSION: SHX5443

## 2017-10-16 HISTORY — DX: Presence of xenogenic heart valve: Z95.3

## 2017-10-16 LAB — POCT I-STAT, CHEM 8
BUN: 26 mg/dL — ABNORMAL HIGH (ref 6–20)
BUN: 24 mg/dL — AB (ref 6–20)
BUN: 20 mg/dL (ref 6–20)
BUN: 24 mg/dL — ABNORMAL HIGH (ref 6–20)
BUN: 23 mg/dL — ABNORMAL HIGH (ref 6–20)
BUN: 19 mg/dL (ref 6–20)
CALCIUM ION: 1.26 mmol/L (ref 1.15–1.40)
CALCIUM ION: 0.95 mmol/L — AB (ref 1.15–1.40)
CALCIUM ION: 1.12 mmol/L — AB (ref 1.15–1.40)
CHLORIDE: 105 mmol/L (ref 101–111)
CHLORIDE: 105 mmol/L (ref 101–111)
CHLORIDE: 102 mmol/L (ref 101–111)
Calcium, Ion: 1.26 mmol/L (ref 1.15–1.40)
Calcium, Ion: 1 mmol/L — ABNORMAL LOW (ref 1.15–1.40)
Calcium, Ion: 1.09 mmol/L — ABNORMAL LOW (ref 1.15–1.40)
Chloride: 101 mmol/L (ref 101–111)
Chloride: 102 mmol/L (ref 101–111)
Chloride: 106 mmol/L (ref 101–111)
Creatinine, Ser: 0.7 mg/dL (ref 0.44–1.00)
Creatinine, Ser: 0.7 mg/dL (ref 0.44–1.00)
Creatinine, Ser: 0.5 mg/dL (ref 0.44–1.00)
Creatinine, Ser: 0.6 mg/dL (ref 0.44–1.00)
Creatinine, Ser: 0.7 mg/dL (ref 0.44–1.00)
Creatinine, Ser: 0.7 mg/dL (ref 0.44–1.00)
GLUCOSE: 217 mg/dL — AB (ref 65–99)
GLUCOSE: 156 mg/dL — AB (ref 65–99)
GLUCOSE: 137 mg/dL — AB (ref 65–99)
Glucose, Bld: 192 mg/dL — ABNORMAL HIGH (ref 65–99)
Glucose, Bld: 153 mg/dL — ABNORMAL HIGH (ref 65–99)
Glucose, Bld: 187 mg/dL — ABNORMAL HIGH (ref 65–99)
HCT: 23 % — ABNORMAL LOW (ref 36.0–46.0)
HCT: 24 % — ABNORMAL LOW (ref 36.0–46.0)
HEMATOCRIT: 31 % — AB (ref 36.0–46.0)
HEMATOCRIT: 30 % — AB (ref 36.0–46.0)
HEMATOCRIT: 22 % — AB (ref 36.0–46.0)
HEMATOCRIT: 33 % — AB (ref 36.0–46.0)
HEMOGLOBIN: 10.5 g/dL — AB (ref 12.0–15.0)
HEMOGLOBIN: 7.8 g/dL — AB (ref 12.0–15.0)
HEMOGLOBIN: 8.2 g/dL — AB (ref 12.0–15.0)
HEMOGLOBIN: 7.5 g/dL — AB (ref 12.0–15.0)
HEMOGLOBIN: 11.2 g/dL — AB (ref 12.0–15.0)
Hemoglobin: 10.2 g/dL — ABNORMAL LOW (ref 12.0–15.0)
POTASSIUM: 3.9 mmol/L (ref 3.5–5.1)
POTASSIUM: 3.8 mmol/L (ref 3.5–5.1)
POTASSIUM: 4.4 mmol/L (ref 3.5–5.1)
Potassium: 3.7 mmol/L (ref 3.5–5.1)
Potassium: 3.9 mmol/L (ref 3.5–5.1)
Potassium: 4.3 mmol/L (ref 3.5–5.1)
SODIUM: 141 mmol/L (ref 135–145)
SODIUM: 142 mmol/L (ref 135–145)
SODIUM: 140 mmol/L (ref 135–145)
SODIUM: 140 mmol/L (ref 135–145)
Sodium: 142 mmol/L (ref 135–145)
Sodium: 141 mmol/L (ref 135–145)
TCO2: 29 mmol/L (ref 22–32)
TCO2: 28 mmol/L (ref 22–32)
TCO2: 26 mmol/L (ref 22–32)
TCO2: 29 mmol/L (ref 22–32)
TCO2: 26 mmol/L (ref 22–32)
TCO2: 24 mmol/L (ref 22–32)

## 2017-10-16 LAB — APTT: aPTT: 36 seconds (ref 24–36)

## 2017-10-16 LAB — POCT I-STAT 3, ART BLOOD GAS (G3+)
ACID-BASE DEFICIT: 3 mmol/L — AB (ref 0.0–2.0)
ACID-BASE DEFICIT: 3 mmol/L — AB (ref 0.0–2.0)
Acid-Base Excess: 2 mmol/L (ref 0.0–2.0)
Acid-Base Excess: 5 mmol/L — ABNORMAL HIGH (ref 0.0–2.0)
Acid-base deficit: 4 mmol/L — ABNORMAL HIGH (ref 0.0–2.0)
BICARBONATE: 26.4 mmol/L (ref 20.0–28.0)
BICARBONATE: 22.6 mmol/L (ref 20.0–28.0)
Bicarbonate: 25.7 mmol/L (ref 20.0–28.0)
Bicarbonate: 29.8 mmol/L — ABNORMAL HIGH (ref 20.0–28.0)
Bicarbonate: 23.1 mmol/L (ref 20.0–28.0)
Bicarbonate: 23.4 mmol/L (ref 20.0–28.0)
O2 SAT: 97 %
O2 SAT: 96 %
O2 Saturation: 100 %
O2 Saturation: 99 %
O2 Saturation: 98 %
O2 Saturation: 94 %
PCO2 ART: 37.5 mmHg (ref 32.0–48.0)
PCO2 ART: 43.5 mmHg (ref 32.0–48.0)
PCO2 ART: 46.9 mmHg (ref 32.0–48.0)
PCO2 ART: 43.1 mmHg (ref 32.0–48.0)
PCO2 ART: 44.9 mmHg (ref 32.0–48.0)
PH ART: 7.444 (ref 7.350–7.450)
PH ART: 7.354 (ref 7.350–7.450)
PH ART: 7.334 — AB (ref 7.350–7.450)
PH ART: 7.313 — AB (ref 7.350–7.450)
PO2 ART: 335 mmHg — AB (ref 83.0–108.0)
PO2 ART: 128 mmHg — AB (ref 83.0–108.0)
PO2 ART: 88 mmHg (ref 83.0–108.0)
PO2 ART: 78 mmHg — AB (ref 83.0–108.0)
Patient temperature: 36.1
Patient temperature: 36.6
TCO2: 27 mmol/L (ref 22–32)
TCO2: 31 mmol/L (ref 22–32)
TCO2: 28 mmol/L (ref 22–32)
TCO2: 24 mmol/L (ref 22–32)
TCO2: 25 mmol/L (ref 22–32)
TCO2: 24 mmol/L (ref 22–32)
pCO2 arterial: 44.4 mmHg (ref 32.0–48.0)
pH, Arterial: 7.444 (ref 7.350–7.450)
pH, Arterial: 7.323 — ABNORMAL LOW (ref 7.350–7.450)
pO2, Arterial: 111 mmHg — ABNORMAL HIGH (ref 83.0–108.0)
pO2, Arterial: 84 mmHg (ref 83.0–108.0)

## 2017-10-16 LAB — PROTIME-INR
INR: 1.51
Prothrombin Time: 18.1 seconds — ABNORMAL HIGH (ref 11.4–15.2)

## 2017-10-16 LAB — CBC
HCT: 35.8 % — ABNORMAL LOW (ref 36.0–46.0)
HEMATOCRIT: 26.9 % — AB (ref 36.0–46.0)
HEMOGLOBIN: 8.8 g/dL — AB (ref 12.0–15.0)
Hemoglobin: 11.8 g/dL — ABNORMAL LOW (ref 12.0–15.0)
MCH: 30 pg (ref 26.0–34.0)
MCH: 29.7 pg (ref 26.0–34.0)
MCHC: 32.7 g/dL (ref 30.0–36.0)
MCHC: 33 g/dL (ref 30.0–36.0)
MCV: 91.8 fL (ref 78.0–100.0)
MCV: 90.2 fL (ref 78.0–100.0)
Platelets: 127 10*3/uL — ABNORMAL LOW (ref 150–400)
Platelets: 119 10*3/uL — ABNORMAL LOW (ref 150–400)
RBC: 2.93 MIL/uL — ABNORMAL LOW (ref 3.87–5.11)
RBC: 3.97 MIL/uL (ref 3.87–5.11)
RDW: 14.4 % (ref 11.5–15.5)
RDW: 14.5 % (ref 11.5–15.5)
WBC: 17.9 10*3/uL — AB (ref 4.0–10.5)
WBC: 17.1 10*3/uL — ABNORMAL HIGH (ref 4.0–10.5)

## 2017-10-16 LAB — GLUCOSE, CAPILLARY
GLUCOSE-CAPILLARY: 136 mg/dL — AB (ref 65–99)
GLUCOSE-CAPILLARY: 132 mg/dL — AB (ref 65–99)
GLUCOSE-CAPILLARY: 122 mg/dL — AB (ref 65–99)
GLUCOSE-CAPILLARY: 123 mg/dL — AB (ref 65–99)
GLUCOSE-CAPILLARY: 119 mg/dL — AB (ref 65–99)
GLUCOSE-CAPILLARY: 165 mg/dL — AB (ref 65–99)
GLUCOSE-CAPILLARY: 143 mg/dL — AB (ref 65–99)
Glucose-Capillary: 184 mg/dL — ABNORMAL HIGH (ref 65–99)
Glucose-Capillary: 117 mg/dL — ABNORMAL HIGH (ref 65–99)

## 2017-10-16 LAB — PREPARE RBC (CROSSMATCH)

## 2017-10-16 LAB — POCT I-STAT 4, (NA,K, GLUC, HGB,HCT)
GLUCOSE: 143 mg/dL — AB (ref 65–99)
HCT: 23 % — ABNORMAL LOW (ref 36.0–46.0)
Hemoglobin: 7.8 g/dL — ABNORMAL LOW (ref 12.0–15.0)
Potassium: 4.1 mmol/L (ref 3.5–5.1)
Sodium: 143 mmol/L (ref 135–145)

## 2017-10-16 LAB — HEMOGLOBIN AND HEMATOCRIT, BLOOD
HEMATOCRIT: 25.3 % — AB (ref 36.0–46.0)
HEMOGLOBIN: 8.4 g/dL — AB (ref 12.0–15.0)

## 2017-10-16 LAB — MAGNESIUM: MAGNESIUM: 3.1 mg/dL — AB (ref 1.7–2.4)

## 2017-10-16 LAB — CREATININE, SERUM
CREATININE: 0.67 mg/dL (ref 0.44–1.00)
GFR calc Af Amer: >60 mL/min (ref >60)
GFR calc non Af Amer: >60 mL/min (ref >60)

## 2017-10-16 LAB — PLATELET COUNT: PLATELETS: 132 10*3/uL — AB (ref 150–400)

## 2017-10-16 SURGERY — MINIMALLY INVASIVE AORTIC VALVE REPLACEMENT WITH PARTIAL STERNOTOMY
Anesthesia: General | Site: Chest

## 2017-10-16 MED ORDER — ONDANSETRON HCL 4 MG/2ML IJ SOLN
4.0000 mg | Freq: Four times a day (QID) | INTRAMUSCULAR | Status: DC | PRN
  Administered 2017-10-17: 4 mg via INTRAVENOUS
  Filled 2017-10-16: qty 2

## 2017-10-16 MED ORDER — SODIUM CHLORIDE 0.9 % IV SOLN: 250.0000 mL | INTRAVENOUS | Status: DC

## 2017-10-16 MED ORDER — METOPROLOL TARTRATE 25 MG/10 ML ORAL SUSPENSION: 12.5000 mg | Freq: Two times a day (BID) | ORAL | Status: DC

## 2017-10-16 MED ORDER — MIDAZOLAM HCL 2 MG/2ML IJ SOLN: 2.0000 mg | INTRAMUSCULAR | Status: DC | PRN

## 2017-10-16 MED ORDER — TRAMADOL HCL 50 MG PO TABS: 50.0000 mg | ORAL_TABLET | ORAL | Status: DC | PRN

## 2017-10-16 MED ORDER — ACETAMINOPHEN 160 MG/5ML PO SOLN: 1000.0000 mg | Freq: Four times a day (QID) | ORAL | Status: DC

## 2017-10-16 MED ORDER — LACTATED RINGERS IV SOLN: INTRAVENOUS | Status: DC

## 2017-10-16 MED ORDER — DOCUSATE SODIUM 100 MG PO CAPS
200.0000 mg | ORAL_CAPSULE | Freq: Every day | ORAL | Status: DC
  Administered 2017-10-17 – 2017-10-21 (×5): 200 mg via ORAL
  Filled 2017-10-16 (×5): qty 2

## 2017-10-16 MED ORDER — MAGNESIUM SULFATE 4 GM/100ML IV SOLN
4.0000 g | Freq: Once | INTRAVENOUS | Status: AC
  Administered 2017-10-16: 4 g via INTRAVENOUS
  Filled 2017-10-16: qty 100

## 2017-10-16 MED ORDER — PHENYLEPHRINE HCL 10 MG/ML IJ SOLN
0.0000 ug/min | INTRAMUSCULAR | Status: DC
  Filled 2017-10-16: qty 2

## 2017-10-16 MED ORDER — ORAL CARE MOUTH RINSE
15.0000 mL | Freq: Four times a day (QID) | OROMUCOSAL | Status: DC
  Administered 2017-10-16 – 2017-10-17 (×3): 15 mL via OROMUCOSAL

## 2017-10-16 MED ORDER — SODIUM CHLORIDE 0.45 % IV SOLN
INTRAVENOUS | Status: DC | PRN
  Administered 2017-10-16: 14:00:00 via INTRAVENOUS

## 2017-10-16 MED ORDER — CHLORHEXIDINE GLUCONATE 0.12 % MT SOLN
OROMUCOSAL | Status: AC
  Administered 2017-10-16: 15 mL via OROMUCOSAL
  Filled 2017-10-16: qty 15

## 2017-10-16 MED ORDER — BISACODYL 10 MG RE SUPP: 10.0000 mg | Freq: Every day | RECTAL | Status: DC

## 2017-10-16 MED ORDER — NITROGLYCERIN IN D5W 200-5 MCG/ML-% IV SOLN: 0.0000 ug/min | INTRAVENOUS | Status: DC

## 2017-10-16 MED ORDER — BISACODYL 5 MG PO TBEC
10.0000 mg | DELAYED_RELEASE_TABLET | Freq: Every day | ORAL | Status: DC
  Administered 2017-10-17 – 2017-10-21 (×5): 10 mg via ORAL
  Filled 2017-10-16 (×5): qty 2

## 2017-10-16 MED ORDER — FAMOTIDINE IN NACL 20-0.9 MG/50ML-% IV SOLN
20.0000 mg | Freq: Two times a day (BID) | INTRAVENOUS | Status: AC
  Administered 2017-10-16 (×2): 20 mg via INTRAVENOUS
  Filled 2017-10-16: qty 50

## 2017-10-16 MED ORDER — DEXTROSE 5 % IV SOLN
1.5000 g | Freq: Two times a day (BID) | INTRAVENOUS | Status: AC
  Administered 2017-10-16 – 2017-10-18 (×4): 1.5 g via INTRAVENOUS
  Filled 2017-10-16 (×4): qty 1.5

## 2017-10-16 MED ORDER — SODIUM CHLORIDE 0.9% FLUSH
10.0000 mL | Freq: Two times a day (BID) | INTRAVENOUS | Status: DC
  Administered 2017-10-16 – 2017-10-17 (×3): 10 mL

## 2017-10-16 MED ORDER — SODIUM CHLORIDE 0.9 % IV SOLN: Freq: Once | INTRAVENOUS | Status: AC

## 2017-10-16 MED ORDER — CHLORHEXIDINE GLUCONATE 0.12 % MT SOLN
15.0000 mL | Freq: Once | OROMUCOSAL | Status: AC
  Administered 2017-10-16: 15 mL via OROMUCOSAL

## 2017-10-16 MED ORDER — ASPIRIN 81 MG PO CHEW: 324.0000 mg | CHEWABLE_TABLET | Freq: Every day | ORAL | Status: DC

## 2017-10-16 MED ORDER — VANCOMYCIN HCL IN DEXTROSE 1-5 GM/200ML-% IV SOLN
1000.0000 mg | Freq: Once | INTRAVENOUS | Status: AC
  Administered 2017-10-16: 1000 mg via INTRAVENOUS
  Filled 2017-10-16: qty 200

## 2017-10-16 MED ORDER — MORPHINE SULFATE (PF) 2 MG/ML IV SOLN: 1.0000 mg | INTRAVENOUS | Status: DC | PRN

## 2017-10-16 MED ORDER — MIDAZOLAM HCL 10 MG/2ML IJ SOLN
INTRAMUSCULAR | Status: AC
  Filled 2017-10-16: qty 2

## 2017-10-16 MED ORDER — 0.9 % SODIUM CHLORIDE (POUR BTL) OPTIME
TOPICAL | Status: DC | PRN
  Administered 2017-10-16: 5000 mL

## 2017-10-16 MED ORDER — SODIUM CHLORIDE 0.9% FLUSH: 10.0000 mL | INTRAVENOUS | Status: DC | PRN

## 2017-10-16 MED ORDER — METOPROLOL TARTRATE 12.5 MG HALF TABLET: 12.5000 mg | ORAL_TABLET | Freq: Two times a day (BID) | ORAL | Status: DC

## 2017-10-16 MED ORDER — POTASSIUM CHLORIDE 10 MEQ/50ML IV SOLN: 10.0000 meq | INTRAVENOUS | Status: AC

## 2017-10-16 MED ORDER — HEPARIN SODIUM (PORCINE) 1000 UNIT/ML IJ SOLN
INTRAMUSCULAR | Status: DC | PRN
  Administered 2017-10-16: 28000 [IU] via INTRAVENOUS

## 2017-10-16 MED ORDER — ALBUMIN HUMAN 5 % IV SOLN
250.0000 mL | INTRAVENOUS | Status: AC | PRN
  Administered 2017-10-16: 250 mL via INTRAVENOUS

## 2017-10-16 MED ORDER — ACETAMINOPHEN 160 MG/5ML PO SOLN: 650.0000 mg | Freq: Once | ORAL | Status: AC

## 2017-10-16 MED ORDER — CHLORHEXIDINE GLUCONATE CLOTH 2 % EX PADS
6.0000 | MEDICATED_PAD | Freq: Every day | CUTANEOUS | Status: DC
  Administered 2017-10-16: 6 via TOPICAL

## 2017-10-16 MED ORDER — FENTANYL CITRATE (PF) 250 MCG/5ML IJ SOLN
INTRAMUSCULAR | Status: AC
  Filled 2017-10-16: qty 25

## 2017-10-16 MED ORDER — FENTANYL CITRATE (PF) 250 MCG/5ML IJ SOLN
INTRAMUSCULAR | Status: AC
  Filled 2017-10-16: qty 5

## 2017-10-16 MED ORDER — SODIUM CHLORIDE 0.9 % IV SOLN: INTRAVENOUS | Status: DC

## 2017-10-16 MED ORDER — SODIUM CHLORIDE 0.9% FLUSH
3.0000 mL | Freq: Two times a day (BID) | INTRAVENOUS | Status: DC
  Administered 2017-10-17 – 2017-10-21 (×3): 3 mL via INTRAVENOUS

## 2017-10-16 MED ORDER — FENTANYL CITRATE (PF) 250 MCG/5ML IJ SOLN
INTRAMUSCULAR | Status: DC | PRN
  Administered 2017-10-16: 100 ug via INTRAVENOUS
  Administered 2017-10-16: 50 ug via INTRAVENOUS
  Administered 2017-10-16: 250 ug via INTRAVENOUS
  Administered 2017-10-16: 900 ug via INTRAVENOUS
  Administered 2017-10-16: 50 ug via INTRAVENOUS
  Administered 2017-10-16: 150 ug via INTRAVENOUS

## 2017-10-16 MED ORDER — ASPIRIN EC 325 MG PO TBEC: 325.0000 mg | DELAYED_RELEASE_TABLET | Freq: Every day | ORAL | Status: DC

## 2017-10-16 MED ORDER — ALBUMIN HUMAN 5 % IV SOLN
INTRAVENOUS | Status: DC | PRN
  Administered 2017-10-16 (×2): via INTRAVENOUS

## 2017-10-16 MED ORDER — METOPROLOL TARTRATE 12.5 MG HALF TABLET
ORAL_TABLET | ORAL | Status: AC
  Administered 2017-10-16: 12.5 mg via ORAL
  Filled 2017-10-16: qty 1

## 2017-10-16 MED ORDER — SODIUM CHLORIDE 0.9 % IV SOLN
INTRAVENOUS | Status: DC
  Filled 2017-10-16 (×2): qty 1

## 2017-10-16 MED ORDER — SODIUM CHLORIDE 0.9% FLUSH: 10.0000 mL | Freq: Two times a day (BID) | INTRAVENOUS | Status: DC

## 2017-10-16 MED ORDER — MIDAZOLAM HCL 5 MG/5ML IJ SOLN
INTRAMUSCULAR | Status: DC | PRN
  Administered 2017-10-16 (×2): 2 mg via INTRAVENOUS
  Administered 2017-10-16: 1 mg via INTRAVENOUS
  Administered 2017-10-16: 2 mg via INTRAVENOUS

## 2017-10-16 MED ORDER — MORPHINE SULFATE (PF) 4 MG/ML IV SOLN: 1.0000 mg | INTRAVENOUS | Status: DC | PRN

## 2017-10-16 MED ORDER — CHLORHEXIDINE GLUCONATE 0.12% ORAL RINSE (MEDLINE KIT)
15.0000 mL | Freq: Two times a day (BID) | OROMUCOSAL | Status: DC
  Administered 2017-10-16: 15 mL via OROMUCOSAL

## 2017-10-16 MED ORDER — PROPOFOL 10 MG/ML IV BOLUS
INTRAVENOUS | Status: DC | PRN
  Administered 2017-10-16: 50 mg via INTRAVENOUS

## 2017-10-16 MED ORDER — CHLORHEXIDINE GLUCONATE 0.12 % MT SOLN
15.0000 mL | OROMUCOSAL | Status: AC
  Administered 2017-10-16: 15 mL via OROMUCOSAL

## 2017-10-16 MED ORDER — METOPROLOL TARTRATE 12.5 MG HALF TABLET
12.5000 mg | ORAL_TABLET | Freq: Once | ORAL | Status: AC
  Administered 2017-10-16: 12.5 mg via ORAL

## 2017-10-16 MED ORDER — METOPROLOL TARTRATE 5 MG/5ML IV SOLN: 2.5000 mg | INTRAVENOUS | Status: DC | PRN

## 2017-10-16 MED ORDER — SODIUM CHLORIDE 0.9 % IV SOLN
INTRAVENOUS | Status: AC
  Administered 2017-10-16: 14:00:00 via INTRAVENOUS

## 2017-10-16 MED ORDER — LACTATED RINGERS IV SOLN: 500.0000 mL | Freq: Once | INTRAVENOUS | Status: DC | PRN

## 2017-10-16 MED ORDER — OXYCODONE HCL 5 MG PO TABS
5.0000 mg | ORAL_TABLET | ORAL | Status: DC | PRN
  Administered 2017-10-17 – 2017-10-18 (×5): 5 mg via ORAL
  Filled 2017-10-16 (×5): qty 1

## 2017-10-16 MED ORDER — ROCURONIUM BROMIDE 10 MG/ML (PF) SYRINGE
PREFILLED_SYRINGE | INTRAVENOUS | Status: DC | PRN
  Administered 2017-10-16 (×3): 50 mg via INTRAVENOUS

## 2017-10-16 MED ORDER — MILRINONE LACTATE IN DEXTROSE 20-5 MG/100ML-% IV SOLN
0.1250 ug/kg/min | INTRAVENOUS | Status: DC
  Filled 2017-10-16: qty 100

## 2017-10-16 MED ORDER — ACETAMINOPHEN 650 MG RE SUPP
650.0000 mg | Freq: Once | RECTAL | Status: AC
  Administered 2017-10-16: 650 mg via RECTAL

## 2017-10-16 MED ORDER — GELATIN ABSORBABLE MT POWD
OROMUCOSAL | Status: DC | PRN
  Administered 2017-10-16 (×4): 4 mL via TOPICAL

## 2017-10-16 MED ORDER — CHLORHEXIDINE GLUCONATE CLOTH 2 % EX PADS: 6.0000 | MEDICATED_PAD | Freq: Every day | CUTANEOUS | Status: DC

## 2017-10-16 MED ORDER — INSULIN REGULAR BOLUS VIA INFUSION
0.0000 [IU] | Freq: Three times a day (TID) | INTRAVENOUS | Status: DC
  Filled 2017-10-16: qty 10

## 2017-10-16 MED ORDER — ACETAMINOPHEN 500 MG PO TABS
1000.0000 mg | ORAL_TABLET | Freq: Four times a day (QID) | ORAL | Status: DC
  Administered 2017-10-17 – 2017-10-21 (×18): 1000 mg via ORAL
  Filled 2017-10-16 (×19): qty 2

## 2017-10-16 MED ORDER — SODIUM CHLORIDE 0.9% FLUSH: 3.0000 mL | INTRAVENOUS | Status: DC | PRN

## 2017-10-16 MED ORDER — PROTAMINE SULFATE 10 MG/ML IV SOLN
INTRAVENOUS | Status: DC | PRN
  Administered 2017-10-16: 270 mg via INTRAVENOUS
  Administered 2017-10-16: 10 mg via INTRAVENOUS

## 2017-10-16 MED ORDER — LACTATED RINGERS IV SOLN
INTRAVENOUS | Status: DC | PRN
  Administered 2017-10-16: 07:00:00 via INTRAVENOUS

## 2017-10-16 MED ORDER — MORPHINE SULFATE (PF) 4 MG/ML IV SOLN
1.0000 mg | INTRAVENOUS | Status: DC | PRN
  Administered 2017-10-16: 1 mg via INTRAVENOUS
  Administered 2017-10-16: 2 mg via INTRAVENOUS
  Filled 2017-10-16 (×2): qty 1

## 2017-10-16 MED ORDER — CHLORHEXIDINE GLUCONATE 4 % EX LIQD: 30.0000 mL | CUTANEOUS | Status: DC

## 2017-10-16 MED ORDER — PANTOPRAZOLE SODIUM 40 MG PO TBEC
40.0000 mg | DELAYED_RELEASE_TABLET | Freq: Every day | ORAL | Status: DC
  Administered 2017-10-18 – 2017-10-21 (×4): 40 mg via ORAL
  Filled 2017-10-16 (×4): qty 1

## 2017-10-16 MED ORDER — SODIUM CHLORIDE 0.9 % IV SOLN
0.0000 ug/kg/h | INTRAVENOUS | Status: DC
  Administered 2017-10-16: 0.4 ug/kg/h via INTRAVENOUS
  Filled 2017-10-16 (×2): qty 2

## 2017-10-16 MED ORDER — LIDOCAINE 2% (20 MG/ML) 5 ML SYRINGE
INTRAMUSCULAR | Status: DC | PRN
  Administered 2017-10-16: 100 mg via INTRAVENOUS

## 2017-10-16 SURGICAL SUPPLY — 112 items
ADAPTER CARDIO PERF ANTE/RETRO (ADAPTER) ×3 IMPLANT
ADH SKN CLS APL DERMABOND .7 (GAUZE/BANDAGES/DRESSINGS) ×2
ADPR PRFSN 84XANTGRD RTRGD (ADAPTER) ×2
BAG DECANTER FOR FLEXI CONT (MISCELLANEOUS) ×3 IMPLANT
BLADE STRYKER (BLADE) ×1 IMPLANT
BLADE SURG 11 STRL SS (BLADE) ×1 IMPLANT
CANISTER SUCT 3000ML PPV (MISCELLANEOUS) ×3 IMPLANT
CANNULA AORTIC ROOT 9FR (CANNULA) ×4 IMPLANT
CANNULA EZ GLIDE AORTIC 21FR (CANNULA) ×3 IMPLANT
CANNULA FEM VENOUS REMOTE 22FR (CANNULA) ×1 IMPLANT
CANNULA GUNDRY RCSP 15FR (MISCELLANEOUS) ×3 IMPLANT
CANNULA SUMP PERICARDIAL (CANNULA) ×3 IMPLANT
CATH ENDOVENT PULMONARY (CATHETERS) IMPLANT
CATH HEART VENT LEFT (CATHETERS) ×2 IMPLANT
CATH RETROPLEGIA CORONARY 14FR (CATHETERS) ×1 IMPLANT
CATH THORACIC 36FR (CATHETERS) ×1 IMPLANT
CONN ST 1/4X3/8  BEN (MISCELLANEOUS) ×4
CONN ST 1/4X3/8 BEN (MISCELLANEOUS) ×4 IMPLANT
CONNECTOR 1/2X3/8X1/2 3 WAY (MISCELLANEOUS) ×2
CONNECTOR 1/2X3/8X1/2 3WAY (MISCELLANEOUS) ×2 IMPLANT
CONT SPEC 4OZ CLIKSEAL STRL BL (MISCELLANEOUS) ×4 IMPLANT
COVER BACK TABLE 24X17X13 BIG (DRAPES) ×3 IMPLANT
COVER PROBE W GEL 5X96 (DRAPES) ×1 IMPLANT
CRADLE DONUT ADULT HEAD (MISCELLANEOUS) ×3 IMPLANT
DERMABOND ADVANCED (GAUZE/BANDAGES/DRESSINGS) ×1
DERMABOND ADVANCED .7 DNX12 (GAUZE/BANDAGES/DRESSINGS) ×4 IMPLANT
DRAIN CHANNEL 32F RND 10.7 FF (WOUND CARE) ×6 IMPLANT
DRAPE BILATERAL SPLIT (DRAPES) ×3 IMPLANT
DRAPE CV SPLIT W-CLR ANES SCRN (DRAPES) ×3 IMPLANT
DRAPE INCISE IOBAN 66X45 STRL (DRAPES) ×9 IMPLANT
DRAPE SLUSH/WARMER DISC (DRAPES) ×3 IMPLANT
DRSG AQUACEL AG ADV 3.5X 6 (GAUZE/BANDAGES/DRESSINGS) ×1 IMPLANT
DRSG AQUACEL AG ADV 3.5X10 (GAUZE/BANDAGES/DRESSINGS) ×1 IMPLANT
ELECT BLADE 4.0 EZ CLEAN MEGAD (MISCELLANEOUS) ×3
ELECT BLADE 6.5 EXT (BLADE) ×3 IMPLANT
ELECT REM PT RETURN 9FT ADLT (ELECTROSURGICAL) ×6
ELECTRODE BLDE 4.0 EZ CLN MEGD (MISCELLANEOUS) ×2 IMPLANT
ELECTRODE REM PT RTRN 9FT ADLT (ELECTROSURGICAL) ×4 IMPLANT
FELT TEFLON 1X6 (MISCELLANEOUS) ×6 IMPLANT
FEMORAL VENOUS CANN RAP (CANNULA) ×1 IMPLANT
GAUZE SPONGE 4X4 12PLY STRL (GAUZE/BANDAGES/DRESSINGS) ×3 IMPLANT
GLOVE BIO SURGEON STRL SZ 6.5 (GLOVE) ×1 IMPLANT
GLOVE BIO SURGEON STRL SZ7 (GLOVE) ×1 IMPLANT
GLOVE BIO SURGEON STRL SZ7.5 (GLOVE) ×1 IMPLANT
GLOVE BIOGEL PI IND STRL 8.5 (GLOVE) IMPLANT
GLOVE BIOGEL PI INDICATOR 8.5 (GLOVE) ×1
GLOVE ORTHO TXT STRL SZ7.5 (GLOVE) ×9 IMPLANT
GLOVE SURG SS PI 6.0 STRL IVOR (GLOVE) ×1 IMPLANT
GOWN STRL REUS W/ TWL LRG LVL3 (GOWN DISPOSABLE) ×8 IMPLANT
GOWN STRL REUS W/TWL LRG LVL3 (GOWN DISPOSABLE) ×21
GRASPER SUT TROCAR 14GX15 (MISCELLANEOUS) ×1 IMPLANT
HEMOSTAT POWDER SURGIFOAM 1G (HEMOSTASIS) ×10 IMPLANT
IV NS IRRIG 3000ML ARTHROMATIC (IV SOLUTION) ×1 IMPLANT
KIT BASIN OR (CUSTOM PROCEDURE TRAY) ×3 IMPLANT
KIT CATH SUCT 8FR (CATHETERS) ×3 IMPLANT
KIT DILATOR VASC 18G NDL (KITS) ×3 IMPLANT
KIT DRAINAGE VACCUM ASSIST (KITS) ×1 IMPLANT
KIT ROOM TURNOVER OR (KITS) ×3 IMPLANT
KIT SUCTION CATH 14FR (SUCTIONS) ×3 IMPLANT
KIT SUT CK MINI COMBO 4X17 (Prosthesis & Implant Heart) ×1 IMPLANT
LEAD PACING MYOCARDI (MISCELLANEOUS) ×3 IMPLANT
LINE VENT (MISCELLANEOUS) ×1 IMPLANT
NS IRRIG 1000ML POUR BTL (IV SOLUTION) ×15 IMPLANT
PACK OPEN HEART (CUSTOM PROCEDURE TRAY) ×3 IMPLANT
PAD ARMBOARD 7.5X6 YLW CONV (MISCELLANEOUS) ×6 IMPLANT
PAD ELECT DEFIB RADIOL ZOLL (MISCELLANEOUS) ×3 IMPLANT
SET CANNULATION TOURNIQUET (MISCELLANEOUS) ×3 IMPLANT
SET CARDIOPLEGIA MPS 5001102 (MISCELLANEOUS) ×1 IMPLANT
SET IRRIG TUBING LAPAROSCOPIC (IRRIGATION / IRRIGATOR) ×4 IMPLANT
SOLUTION ANTI FOG 6CC (MISCELLANEOUS) ×4 IMPLANT
SPONGE LAP 18X18 X RAY DECT (DISPOSABLE) ×1 IMPLANT
SUT BONE WAX W31G (SUTURE) ×3 IMPLANT
SUT ETHIBON 2 0 V 52N 30 (SUTURE) ×1 IMPLANT
SUT ETHIBOND X763 2 0 SH 1 (SUTURE) ×5 IMPLANT
SUT MNCRL AB 3-0 PS2 18 (SUTURE) ×6 IMPLANT
SUT PDS AB 1 CTX 36 (SUTURE) ×8 IMPLANT
SUT PROLENE 3 0 SH DA (SUTURE) ×3 IMPLANT
SUT PROLENE 3 0 SH1 36 (SUTURE) ×8 IMPLANT
SUT PROLENE 4 0 RB 1 (SUTURE) ×18
SUT PROLENE 4 0 SH DA (SUTURE) ×1 IMPLANT
SUT PROLENE 4-0 RB1 .5 CRCL 36 (SUTURE) IMPLANT
SUT PROLENE 5 0 C 1 36 (SUTURE) ×1 IMPLANT
SUT PROLENE 6 0 C 1 30 (SUTURE) ×5 IMPLANT
SUT SILK  1 MH (SUTURE) ×7
SUT SILK 1 MH (SUTURE) IMPLANT
SUT SILK 1 TIES 10X30 (SUTURE) ×1 IMPLANT
SUT SILK 2 0 SH CR/8 (SUTURE) ×2 IMPLANT
SUT SILK 2 0 TIES 10X30 (SUTURE) ×1 IMPLANT
SUT SILK 2 0 TIES 17X18 (SUTURE) ×3
SUT SILK 2-0 18XBRD TIE BLK (SUTURE) IMPLANT
SUT SILK 3 0 SH CR/8 (SUTURE) ×1 IMPLANT
SUT SILK 4 0 TIE 10X30 (SUTURE) ×2 IMPLANT
SUT STEEL 5 MS CCS (SUTURE) ×1 IMPLANT
SUT STEEL SZ 6 DBL 3X14 BALL (SUTURE) ×2 IMPLANT
SUT TEM PAC WIRE 2 0 SH (SUTURE) ×8 IMPLANT
SUT VIC AB 1 CTX 36 (SUTURE) ×3
SUT VIC AB 1 CTX36XBRD ANBCTR (SUTURE) IMPLANT
SUT VIC AB 2-0 CTX 27 (SUTURE) ×8 IMPLANT
SUTURE E-PAK OPEN HEART (SUTURE) ×2 IMPLANT
SYSTEM SAHARA CHEST DRAIN ATS (WOUND CARE) ×3 IMPLANT
TAPE CLOTH SURG 4X10 WHT LF (GAUZE/BANDAGES/DRESSINGS) ×1 IMPLANT
TAPE PAPER 2X10 WHT MICROPORE (GAUZE/BANDAGES/DRESSINGS) ×1 IMPLANT
TOWEL GREEN STERILE FF (TOWEL DISPOSABLE) ×6 IMPLANT
TOWEL OR 17X24 6PK STRL BLUE (TOWEL DISPOSABLE) ×3 IMPLANT
TOWEL OR 17X26 10 PK STRL BLUE (TOWEL DISPOSABLE) ×3 IMPLANT
TRAY FOLEY SILVER 16FR TEMP (SET/KITS/TRAYS/PACK) ×3 IMPLANT
TUBE SUCT INTRACARD DLP 20F (MISCELLANEOUS) ×3 IMPLANT
UNDERPAD 30X30 (UNDERPADS AND DIAPERS) ×3 IMPLANT
VALVE SYSTEM EDWS INTUITY 23A (Prosthesis & Implant Heart) ×2 IMPLANT
VENT LEFT HEART 12002 (CATHETERS) ×3
WATER STERILE IRR 1000ML POUR (IV SOLUTION) ×6 IMPLANT
WIRE .035 3MM-J 145CM (WIRE) ×1 IMPLANT

## 2017-10-16 NOTE — Transfer of Care (Signed)
Immediate Anesthesia Transfer of Care Note  Patient: WINEFRED HILLESHEIM  Procedure(s) Performed: MINIMALLY INVASIVE AORTIC VALVE REPLACEMENT WITH PARTIAL STERNOTOMY - using Edwards Intuity size 57mm (N/A Chest) TRANSESOPHAGEAL ECHOCARDIOGRAM (TEE) (N/A )  Patient Location: SICU  Anesthesia Type:General  Level of Consciousness: Patient remains intubated per anesthesia plan  Airway & Oxygen Therapy: Patient remains intubated per anesthesia plan  Post-op Assessment: Report given to RN and Post -op Vital signs reviewed and stable  Post vital signs: Reviewed and stable  Last Vitals:  Vitals:   10/16/17 0549  BP: (!) 169/65  Pulse: 79  Resp: 18  Temp: 36.6 C  SpO2: 97%    Last Pain:  Vitals:   10/16/17 0549  TempSrc: Oral         Complications: No apparent anesthesia complications

## 2017-10-16 NOTE — Anesthesia Procedure Notes (Signed)
Arterial Line Insertion Start/End11/28/2018 7:12 AM, 10/16/2017 7:13 AM  Patient location: Pre-op. Preanesthetic checklist: patient identified, IV checked, site marked, risks and benefits discussed, surgical consent, monitors and equipment checked, pre-op evaluation, timeout performed and anesthesia consent Lidocaine 1% used for infiltration Left, radial was placed Catheter size: 20 Fr Hand hygiene performed  and maximum sterile barriers used   Attempts: 1 Procedure performed without using ultrasound guided technique. Following insertion, dressing applied. Post procedure assessment: normal and unchanged

## 2017-10-16 NOTE — Interval H&P Note (Signed)
History and Physical Interval Note:  10/16/2017 6:24 AM  Toni Cooper  has presented today for surgery, with the diagnosis of Aortic Stenosis  The various methods of treatment have been discussed with the patient and family. After consideration of risks, benefits and other options for treatment, the patient has consented to  Procedure(s): MINIMALLY INVASIVE AORTIC VALVE REPLACEMENT WITH PARTIAL STERNOTOMY (N/A) TRANSESOPHAGEAL ECHOCARDIOGRAM (TEE) (N/A) as a surgical intervention .  The patient's history has been reviewed, patient examined, no change in status, stable for surgery.  I have reviewed the patient's chart and labs.  Questions were answered to the patient's satisfaction.     Rexene Alberts

## 2017-10-16 NOTE — Brief Op Note (Signed)
10/16/2017  11:41 AM  PATIENT:  Toni Cooper  71 y.o. female  PRE-OPERATIVE DIAGNOSIS:  Aortic Stenosis  POST-OPERATIVE DIAGNOSIS:  Aortic Stenosis  PROCEDURE:  Procedure(s): MINIMALLY INVASIVE AORTIC VALVE REPLACEMENT WITH PARTIAL STERNOTOMY - using Edwards Intuity size 4mm (N/A) TRANSESOPHAGEAL ECHOCARDIOGRAM (TEE) (N/A)  SURGEON:  Surgeon(s) and Role:    * Rexene Alberts, MD - Primary  PHYSICIAN ASSISTANT:  Nicholes Rough, PA-C   ANESTHESIA:   general  EBL: tbd  BLOOD ADMINISTERED:none  DRAINS: ROUTINE   LOCAL MEDICATIONS USED:  NONE  SPECIMEN:  Source of Specimen:  AORTIC VALVE LEAFLETS  DISPOSITION OF SPECIMEN:  PATHOLOGY  COUNTS:  YES  TOURNIQUET:  * No tourniquets in log *  DICTATION: .Dragon Dictation  PLAN OF CARE: Admit to inpatient   PATIENT DISPOSITION:  ICU - intubated and hemodynamically stable.   Delay start of Pharmacological VTE agent (>24hrs) due to surgical blood loss or risk of bleeding: yes

## 2017-10-16 NOTE — Plan of Care (Signed)
adequate progression

## 2017-10-16 NOTE — Progress Notes (Signed)
CT surgery p.m. Rounds  Patient with stable recovery after aortic valve replacement Extubated with stable hemodynamics Neuro intact

## 2017-10-16 NOTE — Anesthesia Preprocedure Evaluation (Addendum)
Anesthesia Evaluation  Patient identified by MRN, date of birth, ID band Patient awake    Reviewed: Allergy & Precautions, NPO status , Patient's Chart, lab work & pertinent test results  Airway Mallampati: I  TM Distance: >3 FB Neck ROM: Full    Dental   Pulmonary    Pulmonary exam normal        Cardiovascular hypertension, Pt. on medications Normal cardiovascular exam+ Valvular Problems/Murmurs AS      Neuro/Psych    GI/Hepatic   Endo/Other  diabetes, Type 2, Insulin Dependent  Renal/GU      Musculoskeletal   Abdominal   Peds  Hematology   Anesthesia Other Findings   Reproductive/Obstetrics                            Anesthesia Physical Anesthesia Plan  ASA: III  Anesthesia Plan: General   Post-op Pain Management:    Induction: Intravenous  PONV Risk Score and Plan: 3  Airway Management Planned: Oral ETT  Additional Equipment: Arterial line, PA Cath, TEE, 3D TEE and Ultrasound Guidance Line Placement  Intra-op Plan:   Post-operative Plan: Post-operative intubation/ventilation  Informed Consent: I have reviewed the patients History and Physical, chart, labs and discussed the procedure including the risks, benefits and alternatives for the proposed anesthesia with the patient or authorized representative who has indicated his/her understanding and acceptance.     Plan Discussed with: CRNA and Surgeon  Anesthesia Plan Comments:         Anesthesia Quick Evaluation

## 2017-10-16 NOTE — Anesthesia Procedure Notes (Signed)
Procedure Name: Intubation Date/Time: 10/16/2017 7:56 AM Performed by: Clearnce Sorrel, CRNA Pre-anesthesia Checklist: Patient identified, Emergency Drugs available, Suction available, Patient being monitored and Timeout performed Patient Re-evaluated:Patient Re-evaluated prior to induction Oxygen Delivery Method: Circle system utilized Preoxygenation: Pre-oxygenation with 100% oxygen Induction Type: IV induction Ventilation: Mask ventilation without difficulty Laryngoscope Size: Mac and 3 Grade View: Grade I Tube type: Oral Tube size: 8.0 mm Number of attempts: 1 Airway Equipment and Method: Stylet Placement Confirmation: ETT inserted through vocal cords under direct vision,  positive ETCO2 and breath sounds checked- equal and bilateral Secured at: 22 cm Tube secured with: Tape Dental Injury: Teeth and Oropharynx as per pre-operative assessment

## 2017-10-16 NOTE — Op Note (Signed)
CARDIOTHORACIC SURGERY OPERATIVE NOTE  Date of Procedure:  10/16/2017  Preoperative Diagnosis: Severe Aortic Stenosis   Postoperative Diagnosis: Same   Procedure:    Minimally-Invasive Aortic Valve Replacement  Partial upper mini-sternotomy  Edwards Intuity Elite rapid deployment bovine pericardial tissue valve (size 23 mm, model # 8300AB, serial # [id number])   Surgeon: [name]. [name], MD  Assistant: Nicholes Rough, PA-C  Anesthesia: [name], MD  Operative Findings:  Bicuspid aortic valve [place] type I) with severe aortic stenosis  Normal left ventricular systolic function       BRIEF CLINICAL NOTE AND INDICATIONS FOR SURGERY  Patient is a 71 year old moderately obese female with history of aortic stenosis, hypertension, insulin-dependent type 2 diabetes mellitus without complications, and osteoporosis with been referred for surgical consultation to discuss treatment options for management of severe symptomatic aortic stenosis. The patient states that she has known about the presence of a heart murmur for several years. Transthoracic echocardiograms have demonstrated the presence of aortic stenosis, andshe has been followed by Dr. Percival Spanish for several years. Over the past 2-3 yearsthe patient has developed symptoms of exertional shortness of breath, decreased exercise tolerance, and a tendency to "give out". Follow-up echocardiogram performed 04/09/2017 revealed significant progression in the severity of aortic stenosis with peak velocity across the aortic valve measured 4.9 m/s corresponding to mean transvalvular gradient estimated 59 mmHg and the DVI only 0.14. Left ventricular systolic function remained normal with ejection fraction estimated 60-65%. The patient was seen in follow-up by Dr. Percival Spanish on 07/17/2017 and scheduled for left and right heart catheterization that was performed by Dr. Burt Knack on 07/25/2017. Catheterization revealed normal coronary arteries with  normal right-sided heart pressures and left ventricular end-diastolic pressure. CT angiography was performed and the patient was subsequently referred for elective surgical consultation.  The patient has been seen in consultation and counseled at length regarding the indications, risks and potential benefits of surgery.  All questions have been answered, and the patient provides full informed consent for the operation as described.    DETAILS OF THE OPERATIVE PROCEDURE  Preparation:  The patient is brought to the operating room on the above mentioned date and central monitoring was established by the anesthesia team including placement of Swan-Ganz catheter and radial arterial line. The patient is placed in the supine position on the operating table.  Intravenous antibiotics are administered. General endotracheal anesthesia is induced uneventfully. A Foley catheter is placed.  Baseline transesophageal echocardiogram was performed.  Findings were notable for bicuspid aortic valve with severe aortic stenosis.  There was normal left ventricular systolic function.  There was moderate left ventricular hypertrophy.  There was mild central mitral regurgitation.  The patient's chest, abdomen, both groins, and both lower extremities are prepared and draped in a sterile manner. A time out procedure is performed.   Surgical Approach:  A partial upper mini-sternotomy incision was performed.   The sternum was divided in the midline from the sternal notch to the level of the fourth intercostal space where it was J'd into the right fourth intercostal space.  And the pericardium is opened. The ascending aorta is normal in appearance.    Extracorporeal Cardiopulmonary Bypass and Myocardial Protection:  The right internal jugular vein is cannulated with Seldinger technique using ultrasound guidance and a guidewire advanced into the right atrium. The right common femoral vein is cannulated with the Seldinger  technique and a guidewire is advanced under transesophageal echocardiogram guidance through the right atrium.  The patient is heparinized systemically.  A 14 French pediatric femoral venous cannula is placed through the right internal jugular vein into the superior vena cava.    The femoral vein is cannulated with a long 22 French femoral venous cannula.  The ascending aorta is cannulated for cardiopulmonary bypass.  Adequate heparinization is verified.  The operative field was continuously flooded with carbon dioxide gas.  The entire pre-bypass portion of the operation was notable for stable hemodynamics.  Cardiopulmonary bypass was begun and a retrograde cardioplegia cannula is placed through the right atrium into the coronary sinus under TEE guidance.  A left ventricular vent is placed through the right superior pulmonary vein.  A cardioplegia cannula is placed in the ascending aorta.   The patient is cooled to 32C systemic temperature.  The aortic cross clamp is applied and cardioplegia is delivered initially in an antegrade fashion through the aortic root using modified [place] cold blood cardioplegia ([place] blood cardioplegia protocol).   The initial cardioplegic arrest is rapid with early diastolic arrest.   Myocardial protection was felt to be excellent.   Aortic Valve Replacement:  An oblique transverse aortotomy incision was performed.  The aortic valve was inspected and notable for [place] type I bicuspid aortic valve with a single raphe between the right and non-coronary leaflets.  There was severe aortic stenosis.  The aortic valve leaflets were excised sharply and the aortic annulus decalcified.  Decalcification was notably straightforward.  The aortic annulus was sized to accept a 23 mm prosthesis.  The aortic root and left ventricle were irrigated with copious cold saline solution.  Aortic valve replacement was performed using an [employer] rapid deployment pericardial  tissue valve.  The valve was rinsed in saline for manufacture recommendations. A total of 3 individual guiding sutures are placed through the aortic annulus, each at the corresponding nadir of each sinus of Valsalva.  The valve was attached to the delivery device and the 3 guiding sutures placed through the valve sewing cuff. The valve is lowered into place. Care is taken to insure that the valve is completely seated in the annulus. Each of the guide sutures are secured using Cor knot clips.  The valve stent is deployed by inflating the deployment balloon to 4.5 atm pressure and holding for 10 seconds. The balloon is deflated the valve holder sutures cut In the deployment system removed.   The valve is carefully inspected to make sure that it is seated appropriately. Endoscopic visualization through the valve is utilized to inspect the left ventricular outflow tract.  At this juncture it appears that there may be a fragment of the annulus caught beneath the bottom of the stent at the nadir of the left sinus of Valsalva.  A decision is made to remove this valve and replace it with a second valve to make certain to have perfect apposition of the valve with the annulus.  The first valve is removed uneventfully.  A second valve is rinsed in saline per protocol (size 23 mm, model #8300AB, serial # [id number]) . A total of 4 individual guiding sutures are placed through the aortic annulus, each at the corresponding nadir of each sinus of Valsalva with 2 sutures straddling the nadir under the left sinus of Valsalva.  The valve was attached to the delivery device and the 4 guiding sutures placed through the valve sewing cuff. The valve is lowered into place. Care is taken to insure that the valve is completely seated in the annulus. Each of the guide sutures  are secured using Cor knot clips.  The valve stent is deployed by inflating the deployment balloon to 4.5 atm pressure and holding for 10 seconds. The balloon is  deflated the valve holder sutures cut In the deployment system removed.   The valve is carefully inspected to make sure that it is seated appropriately. Endoscopic visualization through the valve is utilized to inspect the left ventricular outflow tract.   Aortic root was filled with saline to make sure the valve is competent. Rewarming is begun.   Procedure Completion:  The aortotomy was closed using a 2-layer closure of running 4-0 Prolene suture.  One final dose of warm retrograde "reanimation dose" cardioplegia was administered retrograde through the coronary sinus catheter while all air was evacuated through the aortic root.  The aortic cross clamp was removed after a total cross clamp time of 88 minutes.  Epicardial pacing wires are fixed to the right ventricular outflow tract and to the right atrial appendage. The patient is rewarmed to 37C temperature. The aortic and left ventricular vents are removed.  The patient is weaned and disconnected from cardiopulmonary bypass.  The patient's rhythm at separation from bypass was AV paced.  The patient was weaned from cardiopulmonary bypass without any inotropic support. Total cardiopulmonary bypass time for the operation was 131 minutes.  Followup transesophageal echocardiogram performed after separation from bypass revealed a well-seated aortic valve prosthesis that was functioning normally and without any sign of perivalvular leak.  Deep transgastric views are obtained from multiple angles to make certain the valve is well-seated.  Mean transvalvular gradient across the valve is estimated 3 mmHg.  Left ventricular function was unchanged from preoperatively.  The aortic and venous cannula were removed uneventfully. Protamine was administered to reverse the anticoagulation. The mediastinum and right pleural space were inspected for hemostasis and irrigated with saline solution. The mediastinum and right pleural space were drained using 2 chest tubes  placed through separate stab incisions inferiorly.  The soft tissues anterior to the aorta were reapproximated loosely. The sternum is closed with double strength sternal wire. The soft tissues anterior to the sternum were closed in multiple layers and the skin is closed with a running subcuticular skin closure.  The post-bypass portion of the operation was notable for stable rhythm and hemodynamics.  No blood products were administered during the operation.   Disposition:  The patient tolerated the procedure well and is transported to the surgical intensive care in stable condition. There are no intraoperative complications. All sponge instrument and needle counts are verified correct at completion of the operation.    [name]. [name] MD 10/16/2017 12:47 PM

## 2017-10-16 NOTE — Progress Notes (Signed)
RN observed pt's urinary catheter leaking at vent site. Second RN to the bedside to verify. Per manufacturer instructions catheter to be removed and replaced with a catheter without the vent. This RN and Chiropodist to replace catheter. Peri care done. Sterile technique maintained during entire procedure. Urine return seen immediately after insertion.

## 2017-10-16 NOTE — OR Nursing (Signed)
12:10 - 45 minute call to SICU 12:40 - 20 minute call to SICU

## 2017-10-17 ENCOUNTER — Inpatient Hospital Stay (HOSPITAL_COMMUNITY): Payer: Medicare Other

## 2017-10-17 ENCOUNTER — Encounter (HOSPITAL_COMMUNITY): Payer: Self-pay | Admitting: Thoracic Surgery (Cardiothoracic Vascular Surgery)

## 2017-10-17 DIAGNOSIS — E669 Obesity, unspecified: Secondary | ICD-10-CM | POA: Diagnosis present

## 2017-10-17 LAB — BASIC METABOLIC PANEL
Anion gap: 4 — ABNORMAL LOW (ref 5–15)
BUN: 18 mg/dL (ref 6–20)
CHLORIDE: 109 mmol/L (ref 101–111)
CO2: 25 mmol/L (ref 22–32)
CREATININE: 0.75 mg/dL (ref 0.44–1.00)
Calcium: 7 mg/dL — ABNORMAL LOW (ref 8.9–10.3)
GFR calc Af Amer: >60 mL/min (ref >60)
GFR calc non Af Amer: >60 mL/min (ref >60)
GLUCOSE: 112 mg/dL — AB (ref 65–99)
Potassium: 4.2 mmol/L (ref 3.5–5.1)
SODIUM: 138 mmol/L (ref 135–145)

## 2017-10-17 LAB — GLUCOSE, CAPILLARY
GLUCOSE-CAPILLARY: 120 mg/dL — AB (ref 65–99)
GLUCOSE-CAPILLARY: 109 mg/dL — AB (ref 65–99)
GLUCOSE-CAPILLARY: 106 mg/dL — AB (ref 65–99)
GLUCOSE-CAPILLARY: 110 mg/dL — AB (ref 65–99)
GLUCOSE-CAPILLARY: 113 mg/dL — AB (ref 65–99)
GLUCOSE-CAPILLARY: 108 mg/dL — AB (ref 65–99)
GLUCOSE-CAPILLARY: 171 mg/dL — AB (ref 65–99)
Glucose-Capillary: 105 mg/dL — ABNORMAL HIGH (ref 65–99)
Glucose-Capillary: 106 mg/dL — ABNORMAL HIGH (ref 65–99)
Glucose-Capillary: 106 mg/dL — ABNORMAL HIGH (ref 65–99)
Glucose-Capillary: 107 mg/dL — ABNORMAL HIGH (ref 65–99)
Glucose-Capillary: 107 mg/dL — ABNORMAL HIGH (ref 65–99)
Glucose-Capillary: 109 mg/dL — ABNORMAL HIGH (ref 65–99)
Glucose-Capillary: 115 mg/dL — ABNORMAL HIGH (ref 65–99)
Glucose-Capillary: 124 mg/dL — ABNORMAL HIGH (ref 65–99)
Glucose-Capillary: 141 mg/dL — ABNORMAL HIGH (ref 65–99)
Glucose-Capillary: 151 mg/dL — ABNORMAL HIGH (ref 65–99)
Glucose-Capillary: 202 mg/dL — ABNORMAL HIGH (ref 65–99)

## 2017-10-17 LAB — CBC
HEMATOCRIT: 34.8 % — AB (ref 36.0–46.0)
HEMATOCRIT: 34.1 % — AB (ref 36.0–46.0)
HEMOGLOBIN: 11.3 g/dL — AB (ref 12.0–15.0)
Hemoglobin: 11.1 g/dL — ABNORMAL LOW (ref 12.0–15.0)
MCH: 29.4 pg (ref 26.0–34.0)
MCH: 29.7 pg (ref 26.0–34.0)
MCHC: 32.5 g/dL (ref 30.0–36.0)
MCHC: 32.6 g/dL (ref 30.0–36.0)
MCV: 90.4 fL (ref 78.0–100.0)
MCV: 91.2 fL (ref 78.0–100.0)
Platelets: 126 10*3/uL — ABNORMAL LOW (ref 150–400)
Platelets: 121 10*3/uL — ABNORMAL LOW (ref 150–400)
RBC: 3.85 MIL/uL — ABNORMAL LOW (ref 3.87–5.11)
RBC: 3.74 MIL/uL — ABNORMAL LOW (ref 3.87–5.11)
RDW: 15 % (ref 11.5–15.5)
RDW: 15.3 % (ref 11.5–15.5)
WBC: 19.8 10*3/uL — ABNORMAL HIGH (ref 4.0–10.5)
WBC: 19.8 10*3/uL — ABNORMAL HIGH (ref 4.0–10.5)

## 2017-10-17 LAB — CREATININE, SERUM
Creatinine, Ser: 0.92 mg/dL (ref 0.44–1.00)
GFR calc Af Amer: >60 mL/min (ref >60)

## 2017-10-17 LAB — BPAM RBC
BLOOD PRODUCT EXPIRATION DATE: 201812192359
Blood Product Expiration Date: 201812192359
ISSUE DATE / TIME: 201811281402
ISSUE DATE / TIME: 201811281553
UNIT TYPE AND RH: 6200
Unit Type and Rh: 6200

## 2017-10-17 LAB — TYPE AND SCREEN
ABO/RH(D): A POS
Antibody Screen: NEGATIVE
UNIT DIVISION: 0
Unit division: 0

## 2017-10-17 LAB — BLOOD GAS, ARTERIAL
Acid-base deficit: 0.2 mmol/L (ref 0.0–2.0)
Bicarbonate: 24.6 mmol/L (ref 20.0–28.0)
Drawn by: 44166
O2 Content: 6 L/min
O2 Saturation: 94.4 %
PCO2 ART: 44.6 mmHg (ref 32.0–48.0)
PH ART: 7.36 (ref 7.350–7.450)
Patient temperature: 98.6
pO2, Arterial: 87 mmHg (ref 83.0–108.0)

## 2017-10-17 LAB — POCT I-STAT, CHEM 8
BUN: 21 mg/dL — AB (ref 6–20)
Calcium, Ion: 1 mmol/L — ABNORMAL LOW (ref 1.15–1.40)
Chloride: 102 mmol/L (ref 101–111)
Creatinine, Ser: 0.8 mg/dL (ref 0.44–1.00)
Glucose, Bld: 172 mg/dL — ABNORMAL HIGH (ref 65–99)
HEMATOCRIT: 32 % — AB (ref 36.0–46.0)
HEMOGLOBIN: 10.9 g/dL — AB (ref 12.0–15.0)
Potassium: 4.5 mmol/L (ref 3.5–5.1)
SODIUM: 137 mmol/L (ref 135–145)
TCO2: 24 mmol/L (ref 22–32)

## 2017-10-17 LAB — MAGNESIUM
MAGNESIUM: 2.4 mg/dL (ref 1.7–2.4)
Magnesium: 2.5 mg/dL — ABNORMAL HIGH (ref 1.7–2.4)

## 2017-10-17 MED ORDER — ATORVASTATIN CALCIUM 10 MG PO TABS
10.0000 mg | ORAL_TABLET | Freq: Every day | ORAL | Status: DC
  Administered 2017-10-18 – 2017-10-20 (×3): 10 mg via ORAL
  Filled 2017-10-17 (×3): qty 1

## 2017-10-17 MED ORDER — INSULIN GLARGINE 100 UNIT/ML ~~LOC~~ SOLN
35.0000 [IU] | Freq: Every day | SUBCUTANEOUS | Status: DC
  Administered 2017-10-17 – 2017-10-18 (×2): 35 [IU] via SUBCUTANEOUS
  Filled 2017-10-17 (×3): qty 0.35

## 2017-10-17 MED ORDER — FUROSEMIDE 10 MG/ML IJ SOLN
20.0000 mg | Freq: Once | INTRAMUSCULAR | Status: AC
  Administered 2017-10-18: 20 mg via INTRAVENOUS
  Filled 2017-10-17: qty 2

## 2017-10-17 MED ORDER — FUROSEMIDE 10 MG/ML IJ SOLN
20.0000 mg | Freq: Once | INTRAMUSCULAR | Status: AC
  Administered 2017-10-17: 20 mg via INTRAVENOUS
  Filled 2017-10-17: qty 2

## 2017-10-17 MED ORDER — INSULIN ASPART 100 UNIT/ML ~~LOC~~ SOLN
0.0000 [IU] | SUBCUTANEOUS | Status: DC
  Administered 2017-10-17 (×2): 2 [IU] via SUBCUTANEOUS
  Administered 2017-10-17: 4 [IU] via SUBCUTANEOUS
  Administered 2017-10-17: 8 [IU] via SUBCUTANEOUS
  Administered 2017-10-18: 2 [IU] via SUBCUTANEOUS
  Administered 2017-10-18: 4 [IU] via SUBCUTANEOUS

## 2017-10-17 MED ORDER — CHLORHEXIDINE GLUCONATE CLOTH 2 % EX PADS
6.0000 | MEDICATED_PAD | Freq: Every day | CUTANEOUS | Status: DC
  Administered 2017-10-18 – 2017-10-21 (×3): 6 via TOPICAL

## 2017-10-17 MED ORDER — VITAMIN D 1000 UNITS PO TABS
1000.0000 [IU] | ORAL_TABLET | Freq: Every day | ORAL | Status: DC
  Administered 2017-10-18 – 2017-10-20 (×3): 1000 [IU] via ORAL
  Filled 2017-10-17 (×3): qty 1

## 2017-10-17 MED ORDER — VITAMIN B-6 100 MG PO TABS
100.0000 mg | ORAL_TABLET | Freq: Every day | ORAL | Status: DC
  Administered 2017-10-17 – 2017-10-21 (×5): 100 mg via ORAL
  Filled 2017-10-17 (×6): qty 1

## 2017-10-17 MED ORDER — ASPIRIN EC 325 MG PO TBEC
325.0000 mg | DELAYED_RELEASE_TABLET | Freq: Every day | ORAL | Status: DC
  Administered 2017-10-17 – 2017-10-21 (×5): 325 mg via ORAL
  Filled 2017-10-17 (×5): qty 1

## 2017-10-17 MED ORDER — SODIUM CHLORIDE 0.9 % IV SOLN
INTRAVENOUS | Status: DC
  Filled 2017-10-17: qty 1

## 2017-10-17 MED ORDER — ORAL CARE MOUTH RINSE
15.0000 mL | Freq: Two times a day (BID) | OROMUCOSAL | Status: DC
  Administered 2017-10-17 – 2017-10-20 (×4): 15 mL via OROMUCOSAL

## 2017-10-17 MED ORDER — CHLORHEXIDINE GLUCONATE 4 % EX LIQD
CUTANEOUS | Status: AC
  Filled 2017-10-17: qty 15

## 2017-10-17 MED FILL — Mannitol IV Soln 20%: INTRAVENOUS | Qty: 1000 | Status: AC

## 2017-10-17 MED FILL — Electrolyte-R (PH 7.4) Solution: INTRAVENOUS | Qty: 6000 | Status: AC

## 2017-10-17 MED FILL — Sodium Chloride IV Soln 0.9%: INTRAVENOUS | Qty: 2000 | Status: AC

## 2017-10-17 MED FILL — Heparin Sodium (Porcine) Inj 1000 Unit/ML: INTRAMUSCULAR | Qty: 10 | Status: AC

## 2017-10-17 MED FILL — Sodium Bicarbonate IV Soln 8.4%: INTRAVENOUS | Qty: 50 | Status: AC

## 2017-10-17 MED FILL — Lidocaine HCl IV Inj 20 MG/ML: INTRAVENOUS | Qty: 25 | Status: AC

## 2017-10-17 NOTE — Anesthesia Postprocedure Evaluation (Signed)
Anesthesia Post Note  Patient: Toni Cooper  Procedure(s) Performed: MINIMALLY INVASIVE AORTIC VALVE REPLACEMENT WITH PARTIAL STERNOTOMY - using Edwards Intuity size 57mm (N/A Chest) TRANSESOPHAGEAL ECHOCARDIOGRAM (TEE) (N/A )     Patient location during evaluation: SICU Anesthesia Type: General Level of consciousness: sedated Pain management: pain level controlled Vital Signs Assessment: post-procedure vital signs reviewed and stable Respiratory status: patient remains intubated per anesthesia plan Cardiovascular status: stable Postop Assessment: no apparent nausea or vomiting Anesthetic complications: no    Last Vitals:  Vitals:   10/17/17 0815 10/17/17 0830  BP:    Pulse: 77 79  Resp: (!) 25 19  Temp:    SpO2: 97% 97%    Last Pain:  Vitals:   10/17/17 0800  TempSrc: Core  PainSc: 6                  Harveen Flesch DAVID

## 2017-10-17 NOTE — Progress Notes (Signed)
[address]       [place] [address]             [phone]        CARDIOTHORACIC SURGERY PROGRESS NOTE   R1 Day Post-Op Procedure(s) (LRB): MINIMALLY INVASIVE AORTIC VALVE REPLACEMENT WITH PARTIAL STERNOTOMY - using Edwards Intuity size 46mm (N/A) TRANSESOPHAGEAL ECHOCARDIOGRAM (TEE) (N/A)  Subjective: Looks good.  Minimal soreness in chest.  No SOB.  Voice hoarse from ET tube  Objective: Vital signs: BP Readings from Last 1 Encounters:  10/17/17 121/65   Pulse Readings from Last 1 Encounters:  10/17/17 87   Resp Readings from Last 1 Encounters:  10/17/17 20   Temp Readings from Last 1 Encounters:  10/17/17 99 F (37.2 C)    Hemodynamics: PAP: (23-40)/(11-25) 26/12 CO:  [2.7 L/min-5.4 L/min] 4.3 L/min CI:  [1.3 L/min/m2-2.7 L/min/m2] 2.1 L/min/m2  Physical Exam:  Rhythm:   sinus  Breath sounds: clear  Heart sounds:  RRR w/out murmur  Incisions:  Dressing dry, intact  Abdomen:  Soft, non-distended, non-tender  Extremities:  Warm, well-perfused  Chest tubes:  low volume thin serosanguinous output, no air leak    Intake/Output from previous day: 11/28 0701 - 11/29 0700 In: 6031 [I.V.:4201; Blood:830; IV Piggyback:1000] Out: 2920 [Urine:2140; Blood:400; Chest Tube:380] Intake/Output this shift: No intake/output data recorded.  Lab Results:  CBC: Recent Labs    10/16/17 1900 10/16/17 1908 10/17/17 0415  WBC 17.1*  --  19.8*  HGB 11.8* 11.2* 11.3*  HCT 35.8* 33.0* 34.8*  PLT 119*  --  126*    BMET:  Recent Labs    10/14/17 1528  10/16/17 1908 10/17/17 0415  NA 140   < > 141 138  K 3.7   < > 4.4 4.2  CL 108   < > 106 109  CO2 24  --   --  25  GLUCOSE 134*   < > 137* 112*  BUN 21*   < > 19 18  CREATININE 0.87   < > 0.70 0.75  CALCIUM 9.5  --   --  7.0*   < > = values in this interval not displayed.     PT/INR:   Recent Labs    10/16/17 1443  LABPROT 18.1*  INR 1.51    CBG (last 3)  Recent Labs   10/17/17 0408 10/17/17 0500 10/17/17 0602  GLUCAP 106* 110* 105*    ABG    Component Value Date/Time   PHART 7.360 10/17/2017 0327   PCO2ART 44.6 10/17/2017 0327   PO2ART 87.0 10/17/2017 0327   HCO3 24.6 10/17/2017 0327   TCO2 24 10/16/2017 1920   ACIDBASEDEF 0.2 10/17/2017 0327   O2SAT 94.4 10/17/2017 0327    CXR: Clear.  Very mild bibasilar ATX  EKG: NSR w/out acute ischemic changes, LBBB (new)   Assessment/Plan: S/P Procedure(s) (LRB): MINIMALLY INVASIVE AORTIC VALVE REPLACEMENT WITH PARTIAL STERNOTOMY - using Edwards Intuity size 81mm (N/A) TRANSESOPHAGEAL ECHOCARDIOGRAM (TEE) (N/A)  Doing well POD1 Maintaining NSR w/ stable hemodynamics, no drips Breathing comfortably w/ O2 sats 98% on 4 L/min via [place] Expected post op acute blood loss anemia, mild Expected post op atelectasis, mild Expected post op volume excess, weight reportedly 3 lbs > preop Type II diabetes mellitus, excellent glycemic control on insulin drip LBBB (new)   Mobilize  Diuresis  Hold beta blockers for now  D/C tubes and lines  Resume home lantus insulin and convert to SSI q 4 hours  D/C tubes later today or tomorrow, depending on output   Rexene Alberts, MD 10/17/2017 7:50 AM

## 2017-10-17 NOTE — Anesthesia Procedure Notes (Addendum)
Central Venous Catheter Insertion Performed by: [name], MD, anesthesiologist Start/End11/28/2018 7:05 AM, 10/16/2017 7:15 AM Patient location: Pre-op. Preanesthetic checklist: patient identified, IV checked, risks and benefits discussed, surgical consent, monitors and equipment checked, pre-op evaluation, timeout performed and anesthesia consent Position: Trendelenburg Lidocaine 1% used for infiltration and patient sedated Hand hygiene performed , maximum sterile barriers used  and Seldinger technique used Catheter size: 8.5 Fr PA cath was placed.Sheath introducer Swan type:thermodilution Procedure performed using ultrasound guided technique. Ultrasound Notes:anatomy identified, needle tip was noted to be adjacent to the nerve/plexus identified, no ultrasound evidence of intravascular and/or intraneural injection and image(s) printed for medical record Attempts: 1 Following insertion, line sutured, dressing applied and Biopatch. Post procedure assessment: blood return through all ports, free fluid flow and no air  Patient tolerated the procedure well with no immediate complications.

## 2017-10-17 NOTE — Addendum Note (Signed)
Addendum  created 10/17/17 1458 by [name], MD   Intraprocedure Blocks edited, Sign clinical note

## 2017-10-17 NOTE — Addendum Note (Signed)
Addendum  created 10/17/17 1456 by [name], MD   Child order released for a procedure order, Intraprocedure Blocks edited, Sign clinical note

## 2017-10-17 NOTE — Discharge Summary (Signed)
Physician Discharge Summary       [address]       [place] [address]             [phone]    Patient ID: Toni Cooper MRN: [id number] DOB/AGE: 1946/03/07 71 y.o.  Admit date: 10/16/2017 Discharge date: 10/21/2017  Admission Diagnoses: Severe aortic stenosis  Active Diagnoses:  1. Hypertension 2. Diabetes ([place]) 3. Obesity 4. Cataract 5. Osteoporosis 6. ABL anemia 7. Mild thromboctyopenia  Procedure (s):   Minimally-Invasive Aortic Valve Replacement             Partial upper mini-sternotomy             Edwards Intuity Elite rapid deployment bovine pericardial tissue valve (size 23 mm, model # 8300AB, serial # [id number]) by Dr. [name] on 10/16/2017.  History of Presenting Illness: Patient is a 72 year old moderately obese female with history of aortic stenosis, hypertension, insulin-dependent type 2 diabetes mellitus without complications, and osteoporosis with been referred for surgical consultation to discuss treatment options for management of severe symptomatic aortic stenosis. The patient states that she has known about the presence of a heart murmur for several years. Transthoracic echocardiograms have demonstrated the presence of aortic stenosis, andshe has been followed by Dr. Percival Spanish for several years. Over the past 2-3 yearsthe patient has developed symptoms of exertional shortness of breath, decreased exercise tolerance, and a tendency to "give out". Follow-up echocardiogram performed 04/09/2017 revealed significant progression in the severity of aortic stenosis with peak velocity across the aortic valve measured 4.9 m/s corresponding to mean transvalvular gradient estimated 59 mmHg and the DVI only 0.14. Left ventricular systolic function remained normal with ejection fraction estimated 60-65%. The patient was seen in follow-up by Dr. Percival Spanish on 07/17/2017 and scheduled for left and right heart catheterization that was performed by Dr. Burt Knack on  07/25/2017. Catheterization revealed normal coronary arteries with normal right-sided heart pressures and left ventricular end-diastolic pressure. CT angiography was performed and the patient was subsequently referred for elective surgical consultation.  Patient is married and lives with her husband in [place]. 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 She has remained reasonably active and functionally independent. She complains that over the past 2-3 years she has developed worsening exercise tolerance, increased fatigue, exertional shortness of breath, and a tendency to "give out". She states that with ordinary activities she feels well but she is not able to keep up with chores as easily as he used to in the past. She denies any exertional chest pain or chest tightness. She denies any shortness of breath with low-level activities around the house. She has never had any resting shortness of breath, PND, orthopnea, or lower extremity edema. Her mobility is not limited to any significant degree. She states that her diabetes has been under good control and she checks her blood sugars regularly at home.  The patientand her husband were againcounseled at length regarding treatment alternatives for management of severe aortic stenosis including continued medical therapy versus proceeding with aortic valve replacement in the near future. The natural history of aortic stenosis was reviewed, as was long term prognosis with medical therapy alone. Surgical options were discussed at length including conventional surgical aortic valve replacement through either a full median sternotomy or using minimally invasive techniques. Other alternatives including rapid-deployment bioprosthetic tissue valve replacement, transcatheter aortic valve replacement, patch enlargement of the aortic root, stentless porcine aortic root replacement, valve  repair, the Ross autograft procedure, and homograft aortic root replacement were discussed. Discussion was held comparing the relative risks of mechanical valve replacement with need for lifelong anticoagulation versus use of a bioprosthetic tissue valve and the associated potential for late structural valve deterioration and failure. This discussion was placed in the context of the patient's particular circumstances, and as a result the patient specifically requests that their valve be replaced using a bioprosthetic tissue valve. The potential advantages and disadvantages associated with use of a rapid-deployment bioprosthetic aortic valve were discussed, including the risks of paravalvular leak, need for permanent pacemaker placement, and expectations for long-term durability. Alternative surgical approaches have been discussed including a comparison between conventional sternotomy and minimally-invasive techniques. The relative risks and benefits of each have been reviewed as they pertain to the patient's specific circumstances, and all of their questions have been addressed.  The patient wishes to proceed with minimally invasive AVR. Pre operative carotid duplex US showed no significant internal carotid artery stenosis bilaterally. The patient has been instructed to stop taking metformin and aspirin. She was admitted on 10/16/2017 in order to undergo a minimally invasive AVR.   Brief Hospital Course:  The patient was extubated the evening of surgery without difficulty. Her voice was hoarse secondary to ETT placement. She remained afebrile and hemodynamically stable. Gordy Councilman, a line, chest tubes, and foley were removed early in the post operative course. She was volume over loaded and diuresed. She had ABL anemia. She did not require a post op transfusion. Last H and H was 10.3 and 32.4. She was put on Niferex. She had mild thrombocytopenia and her last platelet count was 148,000. She was weaned off  the insulin drip.  Once she was tolerating a diet,  Metformin XR was restarted.  The patient's glucose remained well controlled.The patient's HGA1C pre op was 6.9. The patient was felt surgically stable for transfer from the ICU to PCTU for further convalescence on 10/18/2017. She continues to progress with cardiac rehab. She was ambulating on room air. She has been tolerating a diet and has had a bowel movement. Epicardial pacing wires were removed on 10/18/2017. Chest tube sutures will be removed in the office after discharge. The patient is felt surgically stable for discharge today.  Latest Vital Signs: Blood pressure (!) 174/71, pulse 88, temperature 97.9 F (36.6 C), temperature source Oral, resp. rate (!) 23, height 5\' 4"  (1.626 m), weight 218 lb 9.6 oz (99.2 kg), SpO2 95 %.  Physical Exam:  Cardiovascular: RRR Pulmonary: Slightly diminished at bases Abdomen: Soft, obese, non tender, bowel sounds present. Extremities: Bilateral lower extremity edema. Wound: Sternal wound is clean and dry.  No erythema or signs of infection.  Discharge Condition:Stable and discharged to home.  Recent laboratory studies:  Lab Results  Component Value Date   WBC 14.7 (H) 10/19/2017   HGB 10.3 (L) 10/19/2017   HCT 32.4 (L) 10/19/2017   MCV 92.8 10/19/2017   PLT 148 (L) 10/19/2017   Lab Results  Component Value Date   NA 138 10/19/2017   K 4.0 10/19/2017   CL 104 10/19/2017   CO2 29 10/19/2017   CREATININE 0.89 10/19/2017   GLUCOSE 99 10/19/2017     Diagnostic Studies: Dg Chest 2 View  Result Date: 10/19/2017 CLINICAL DATA:  Atelectasis EXAM: CHEST  2 VIEW COMPARISON:  10/18/2017 FINDINGS: Prior median sternotomy and valve replacement. Continued low lung volumes with bibasilar atelectasis and small effusions, unchanged. No pneumothorax. IMPRESSION: Low lung volumes with bibasilar  atelectasis and small effusions. Electronically Signed   By: Rolm Baptise M.D.   On: 10/19/2017 09:03   Discharge  Instructions    Amb Referral to Cardiac Rehabilitation   Complete by:  As directed    Diagnosis:  Valve Replacement   Valve:  Aortic     Discharge Medications: Allergies as of 10/21/2017      Reactions   Sulfa Antibiotics Nausea And Vomiting      Medication List    STOP taking these medications   diltiazem 120 MG 24 hr capsule Commonly known as:  CARDIZEM CD   Fish Oil 1200 MG Caps   ibuprofen 200 MG tablet Commonly known as:  ADVIL,MOTRIN     TAKE these medications   acetaminophen 500 MG tablet Commonly known as:  TYLENOL Take 500 mg 2 (two) times daily as needed by mouth for moderate pain or headache.   alendronate 70 MG tablet Commonly known as:  FOSAMAX Take 1 tablet (70 mg total) by mouth every 7 (seven) days. Take with a full glass of water on an empty stomach.   aspirin 325 MG EC tablet Take 1 tablet (325 mg total) by mouth daily. Start taking on:  10/22/2017 What changed:    medication strength  how much to take   atorvastatin 10 MG tablet Commonly known as:  LIPITOR Take 1 tablet (10 mg total) by mouth daily at 6 PM. What changed:    how much to take  how to take this  when to take this  additional instructions   beta carotene w/minerals tablet Take 1 tablet by mouth daily.   MULTI-BETIC DIABETES Tabs Take 1 tablet daily by mouth.   calcium carbonate 500 MG chewable tablet Commonly known as:  TUMS - dosed in mg elemental calcium Chew 1 tablet by mouth daily as needed for indigestion or heartburn.   calcium-vitamin D 500-200 MG-UNIT tablet Commonly known as:  OSCAL 500/200 D-3 Take 1 tablet by mouth 2 (two) times daily.   cholecalciferol 1000 units tablet Commonly known as:  VITAMIN D Take 1,000 Units by mouth daily.   fluticasone 50 MCG/ACT nasal spray Commonly known as:  FLONASE Place 1 spray into both nostrils daily as needed for allergies or rhinitis.   HUMALOG 100 UNIT/ML injection Generic drug:  insulin lispro Inject 6-20  Units into the skin 3 (three) times daily with meals. Using sliding scale.   hydrochlorothiazide 25 MG tablet Commonly known as:  HYDRODIURIL Take 1 tablet (25 mg total) daily by mouth.   LANTUS 100 UNIT/ML injection Generic drug:  insulin glargine Inject 35 Units into the skin at bedtime.   lisinopril 20 MG tablet Commonly known as:  PRINIVIL,ZESTRIL Take 1 tablet (20 mg total) by mouth daily. Start taking on:  10/22/2017 What changed:    medication strength  how much to take   Magnesium 250 MG Tabs Take 250 mg by mouth daily.   metFORMIN 500 MG 24 hr tablet Commonly known as:  GLUCOPHAGE-XR Take 1 tablet (500 mg total) 2 (two) times daily by mouth.   metoprolol tartrate 25 MG tablet Commonly known as:  LOPRESSOR Take 0.5 tablets (12.5 mg total) by mouth 2 (two) times daily.   oxyCODONE 5 MG immediate release tablet Commonly known as:  Oxy IR/ROXICODONE Take 1 tablet (5 mg total) by mouth every 4 (four) hours as needed for severe pain.   pyridOXINE 100 MG tablet Commonly known as:  VITAMIN B-6 Take 100 mg by mouth daily.  TRUETRACK TEST test strip Generic drug:  glucose blood Test four times daily.   vitamin C 1000 MG tablet Take 1,000 mg daily by mouth.      The patient has been discharged on:   1.Beta Blocker:  Yes [ x  ]                              No   [   ]                              If No, reason:  2.Ace Inhibitor/ARB: Yes [ x  ]                                     No  [    ]                                     If No, reason:  3.Statin:   Yes [  x ]                  No  [   ]                  If No, reason:  4.Ecasa:  Yes  [ x  ]                  No   [   ]                  If No, reason:  Follow Up Appointments: Follow-up Information    Rexene Alberts, MD Follow up on 11/04/2017.   Specialty:  Cardiothoracic Surgery Why:  PA/LAT CXR to be taken (at [facility] which is in the same building as Dr. Guy Sandifer office) on 11/04/2017  at 3:30 pm;Appointment time is with physician assistant at 4:00 pm Contact information: [place] [address] [phone]        Barrett, Evelene Croon, PA-C. Go on 11/05/2017.   Specialties:  Cardiology, Radiology Why:  Appointment time is 9:30 am Contact information: [address] [place] [address] [phone]        [name], [place]. Call in 1 day(s).   Specialty:  Family Medicine Contact information: [address] [place] [address] [phone]           Signed: Elgie Collard PA-C 10/21/2017, 12:41 PM

## 2017-10-17 NOTE — Progress Notes (Signed)
      [address]       [place] [address]             [phone]      Up in chair  BP (!) 113/52   Pulse 84   Temp 98.7 F (37.1 C) (Oral)   Resp (!) 25   Ht 5\' 4"  (1.626 m)   Wt 228 lb 9.6 oz (103.7 kg)   SpO2 96%   BMI 39.24 kg/m    Intake/Output Summary (Last 24 hours) at 10/17/2017 1730 Last data filed at 10/17/2017 1700 Gross per 24 hour  Intake 1783.98 ml  Output 1555 ml  Net 228.98 ml   Creatinine and lytes OK Hct= 32 CBG a little higher, but acceptable control  Tirsa Gail C. Roxan Hockey, MD [facility] [phone]

## 2017-10-18 ENCOUNTER — Inpatient Hospital Stay (HOSPITAL_COMMUNITY): Payer: Medicare Other

## 2017-10-18 LAB — GLUCOSE, CAPILLARY
Glucose-Capillary: 143 mg/dL — ABNORMAL HIGH (ref 65–99)
Glucose-Capillary: 181 mg/dL — ABNORMAL HIGH (ref 65–99)
Glucose-Capillary: 261 mg/dL — ABNORMAL HIGH (ref 65–99)
Glucose-Capillary: 234 mg/dL — ABNORMAL HIGH (ref 65–99)
Glucose-Capillary: 280 mg/dL — ABNORMAL HIGH (ref 65–99)

## 2017-10-18 LAB — CBC
HEMATOCRIT: 32.1 % — AB (ref 36.0–46.0)
Hemoglobin: 10.4 g/dL — ABNORMAL LOW (ref 12.0–15.0)
MCH: 30.1 pg (ref 26.0–34.0)
MCHC: 32.4 g/dL (ref 30.0–36.0)
MCV: 92.8 fL (ref 78.0–100.0)
PLATELETS: 121 10*3/uL — AB (ref 150–400)
RBC: 3.46 MIL/uL — ABNORMAL LOW (ref 3.87–5.11)
RDW: 15.4 % (ref 11.5–15.5)
WBC: 16.7 10*3/uL — AB (ref 4.0–10.5)

## 2017-10-18 LAB — BASIC METABOLIC PANEL
Anion gap: 4 — ABNORMAL LOW (ref 5–15)
BUN: 22 mg/dL — AB (ref 6–20)
CHLORIDE: 104 mmol/L (ref 101–111)
CO2: 26 mmol/L (ref 22–32)
CREATININE: 0.99 mg/dL (ref 0.44–1.00)
Calcium: 7 mg/dL — ABNORMAL LOW (ref 8.9–10.3)
GFR calc Af Amer: >60 mL/min (ref >60)
GFR calc non Af Amer: 56 mL/min — ABNORMAL LOW (ref >60)
GLUCOSE: 152 mg/dL — AB (ref 65–99)
Potassium: 4.4 mmol/L (ref 3.5–5.1)
SODIUM: 134 mmol/L — AB (ref 135–145)

## 2017-10-18 MED ORDER — SODIUM CHLORIDE 0.9% FLUSH: 3.0000 mL | INTRAVENOUS | Status: DC | PRN

## 2017-10-18 MED ORDER — POTASSIUM CHLORIDE CRYS ER 20 MEQ PO TBCR
20.0000 meq | EXTENDED_RELEASE_TABLET | Freq: Two times a day (BID) | ORAL | Status: DC
  Administered 2017-10-19 – 2017-10-21 (×5): 20 meq via ORAL
  Filled 2017-10-18 (×6): qty 1

## 2017-10-18 MED ORDER — METOPROLOL TARTRATE 12.5 MG HALF TABLET
12.5000 mg | ORAL_TABLET | Freq: Two times a day (BID) | ORAL | Status: DC
  Administered 2017-10-18 – 2017-10-21 (×7): 12.5 mg via ORAL
  Filled 2017-10-18 (×7): qty 1

## 2017-10-18 MED ORDER — POLYSACCHARIDE IRON COMPLEX 150 MG PO CAPS
150.0000 mg | ORAL_CAPSULE | Freq: Every day | ORAL | Status: DC
  Administered 2017-10-19 – 2017-10-21 (×3): 150 mg via ORAL
  Filled 2017-10-18 (×3): qty 1

## 2017-10-18 MED ORDER — SODIUM CHLORIDE 0.9 % IV SOLN: 250.0000 mL | INTRAVENOUS | Status: DC | PRN

## 2017-10-18 MED ORDER — FUROSEMIDE 40 MG PO TABS
40.0000 mg | ORAL_TABLET | Freq: Every day | ORAL | Status: DC
  Administered 2017-10-18: 40 mg via ORAL
  Filled 2017-10-18: qty 1

## 2017-10-18 MED ORDER — MOVING RIGHT ALONG BOOK
Freq: Once | Status: AC
  Administered 2017-10-18: 11:00:00
  Filled 2017-10-18: qty 1

## 2017-10-18 MED ORDER — SODIUM CHLORIDE 0.9% FLUSH
3.0000 mL | Freq: Two times a day (BID) | INTRAVENOUS | Status: DC
  Administered 2017-10-18 – 2017-10-20 (×5): 3 mL via INTRAVENOUS

## 2017-10-18 MED ORDER — INSULIN ASPART 100 UNIT/ML ~~LOC~~ SOLN
0.0000 [IU] | Freq: Three times a day (TID) | SUBCUTANEOUS | Status: DC
  Administered 2017-10-18: 12 [IU] via SUBCUTANEOUS
  Administered 2017-10-18: 8 [IU] via SUBCUTANEOUS
  Administered 2017-10-18: 12 [IU] via SUBCUTANEOUS
  Administered 2017-10-19 (×3): 4 [IU] via SUBCUTANEOUS
  Administered 2017-10-20: 2 [IU] via SUBCUTANEOUS
  Administered 2017-10-20 (×2): 8 [IU] via SUBCUTANEOUS
  Administered 2017-10-21: 4 [IU] via SUBCUTANEOUS
  Administered 2017-10-21: 8 [IU] via SUBCUTANEOUS

## 2017-10-18 NOTE — Progress Notes (Signed)
[address]       [place] [address]             [phone]        CARDIOTHORACIC SURGERY PROGRESS NOTE   R2 Days Post-Op Procedure(s) (LRB): MINIMALLY INVASIVE AORTIC VALVE REPLACEMENT WITH PARTIAL STERNOTOMY - using Edwards Intuity size 42mm (N/A) TRANSESOPHAGEAL ECHOCARDIOGRAM (TEE) (N/A)  Subjective: Looks good.  No complaints.  Mild soreness in chest w/ cough or deep breath.  No SOB.   Appetite improving  Objective: Vital signs: BP Readings from Last 1 Encounters:  10/18/17 120/60   Pulse Readings from Last 1 Encounters:  10/18/17 86   Resp Readings from Last 1 Encounters:  10/18/17 (!) 21   Temp Readings from Last 1 Encounters:  10/18/17 98.5 F (36.9 C) (Oral)    Hemodynamics:    Physical Exam:  Rhythm:   sinus  Breath sounds: clear  Heart sounds:  RRR  Incisions:  Dressing dry, intact  Abdomen:  Soft, non-distended, non-tender  Extremities:  Warm, well-perfused  Chest tubes:  low volume thin serosanguinous output, no air leak    Intake/Output from previous day: 11/29 0701 - 11/30 0700 In: 353.4 [I.V.:353.4] Out: 1285 [Urine:925; Chest Tube:360] Intake/Output this shift: No intake/output data recorded.  Lab Results:  CBC: Recent Labs    10/17/17 1627 10/17/17 1641 10/18/17 0323  WBC 19.8*  --  16.7*  HGB 11.1* 10.9* 10.4*  HCT 34.1* 32.0* 32.1*  PLT 121*  --  121*    BMET:  Recent Labs    10/17/17 0415  10/17/17 1641 10/18/17 0323  NA 138  --  137 134*  K 4.2  --  4.5 4.4  CL 109  --  102 104  CO2 25  --   --  26  GLUCOSE 112*  --  172* 152*  BUN 18  --  21* 22*  CREATININE 0.75   < > 0.80 0.99  CALCIUM 7.0*  --   --  7.0*   < > = values in this interval not displayed.     PT/INR:   Recent Labs    10/16/17 1443  LABPROT 18.1*  INR 1.51    CBG (last 3)  Recent Labs    10/17/17 2000 10/18/17 0328 10/18/17 0756  GLUCAP 202* 143* 181*    ABG    Component Value Date/Time   PHART 7.360  10/17/2017 0327   PCO2ART 44.6 10/17/2017 0327   PO2ART 87.0 10/17/2017 0327   HCO3 24.6 10/17/2017 0327   TCO2 24 10/17/2017 1641   ACIDBASEDEF 0.2 10/17/2017 0327   O2SAT 94.4 10/17/2017 0327    CXR: Mild bibasilar ATX L>R  Assessment/Plan: S/P Procedure(s) (LRB): MINIMALLY INVASIVE AORTIC VALVE REPLACEMENT WITH PARTIAL STERNOTOMY - using Edwards Intuity size 57mm (N/A) TRANSESOPHAGEAL ECHOCARDIOGRAM (TEE) (N/A)  Doing well POD2 Maintaining NSR w/ stable BP Breathing comfortably w/ O2 sats 95% on 2 L/min via [place] Expected post op acute blood loss anemia, mild, stable Expected post op atelectasis, mild Expected post op volume excess, weight reportedly 2 lbs > preop Type II diabetes mellitus, adequate glycemic control  LBBB (new)   Mobilize  Diuresis  Start low dose beta blocker  Continue to hold ACE-I and consider resuming prior to d/c as BP allows  D/C tubes and lines  Convert CBGs and SSI to ac/hs  Resume metformin and home humalog dosing once appetite improves  Transfer 4E   Rexene Alberts, MD 10/18/2017 8:33 AM

## 2017-10-18 NOTE — Care Management Note (Signed)
Case Management Note  Patient Details  Name: Toni Cooper MRN: [id number] Date of Birth: [birth year]  Subjective/Objective:     From home with spouse who can assist patient 24/7  At home, she does not use any assistive devices at home to ambulate.  She has PCP and medication coverage. She is POD 2 AVR , partial sternotomy, dc chest tubes, cont to diuresis.                Action/Plan: NCM will follow for dc needs.   Expected Discharge Date:                  Expected Discharge Plan:  Home/Self Care  In-House Referral:     Discharge planning Services  CM Consult  Post Acute Care Choice:    Choice offered to:     DME Arranged:    DME Agency:     HH Arranged:    HH Agency:     Status of Service:  In process, will continue to follow  If discussed at Long Length of Stay Meetings, dates discussed:    Additional Comments:  Zenon Mayo, RN 10/18/2017, 11:03 AM

## 2017-10-18 NOTE — Progress Notes (Signed)
Received from 2 heart to room 4E10. Oriented to room and plan of care. Applied telemetry and notified [place]. Family with pt at bedside no needs at this time. N/C of pain. Pola Corn, RN

## 2017-10-18 NOTE — Progress Notes (Signed)
Foley catheter removed per MD order.  Patient instructed of the need to void by 1745 this evening.  Will continue to monitor.

## 2017-10-18 NOTE — Progress Notes (Signed)
Pt ambulated 400 ft with rolling walker and 2L O2. Standby assist. Pt tolerated well. Returned to chair. Will continue to monitor.   Grant Fontana BSN, RN

## 2017-10-19 ENCOUNTER — Inpatient Hospital Stay (HOSPITAL_COMMUNITY): Payer: Medicare Other

## 2017-10-19 LAB — GLUCOSE, CAPILLARY
GLUCOSE-CAPILLARY: 101 mg/dL — AB (ref 65–99)
GLUCOSE-CAPILLARY: 138 mg/dL — AB (ref 65–99)
GLUCOSE-CAPILLARY: 139 mg/dL — AB (ref 65–99)
GLUCOSE-CAPILLARY: 188 mg/dL — AB (ref 65–99)
Glucose-Capillary: 178 mg/dL — ABNORMAL HIGH (ref 65–99)
Glucose-Capillary: 175 mg/dL — ABNORMAL HIGH (ref 65–99)

## 2017-10-19 LAB — BASIC METABOLIC PANEL
ANION GAP: 5 (ref 5–15)
BUN: 21 mg/dL — ABNORMAL HIGH (ref 6–20)
CALCIUM: 7.4 mg/dL — AB (ref 8.9–10.3)
CO2: 29 mmol/L (ref 22–32)
Chloride: 104 mmol/L (ref 101–111)
Creatinine, Ser: 0.89 mg/dL (ref 0.44–1.00)
Glucose, Bld: 99 mg/dL (ref 65–99)
Potassium: 4 mmol/L (ref 3.5–5.1)
Sodium: 138 mmol/L (ref 135–145)

## 2017-10-19 LAB — CBC
HCT: 32.4 % — ABNORMAL LOW (ref 36.0–46.0)
Hemoglobin: 10.3 g/dL — ABNORMAL LOW (ref 12.0–15.0)
MCH: 29.5 pg (ref 26.0–34.0)
MCHC: 31.8 g/dL (ref 30.0–36.0)
MCV: 92.8 fL (ref 78.0–100.0)
PLATELETS: 148 10*3/uL — AB (ref 150–400)
RBC: 3.49 MIL/uL — AB (ref 3.87–5.11)
RDW: 15 % (ref 11.5–15.5)
WBC: 14.7 10*3/uL — AB (ref 4.0–10.5)

## 2017-10-19 MED ORDER — FUROSEMIDE 10 MG/ML IJ SOLN
40.0000 mg | Freq: Once | INTRAMUSCULAR | Status: AC
  Administered 2017-10-19: 40 mg via INTRAVENOUS
  Filled 2017-10-19: qty 4

## 2017-10-19 MED ORDER — FUROSEMIDE 40 MG PO TABS: 40.0000 mg | ORAL_TABLET | Freq: Every day | ORAL | Status: DC

## 2017-10-19 MED ORDER — LISINOPRIL 2.5 MG PO TABS
2.5000 mg | ORAL_TABLET | Freq: Every day | ORAL | Status: DC
  Administered 2017-10-19: 2.5 mg via ORAL
  Filled 2017-10-19: qty 1

## 2017-10-19 MED ORDER — METFORMIN HCL ER 500 MG PO TB24
500.0000 mg | ORAL_TABLET | Freq: Two times a day (BID) | ORAL | Status: DC
  Administered 2017-10-19 – 2017-10-20 (×4): 500 mg via ORAL
  Filled 2017-10-19 (×6): qty 1

## 2017-10-19 NOTE — Progress Notes (Signed)
CARDIAC REHAB PHASE I   PRE:  Rate/Rhythm: 92 SR  BP:  Supine:   Sitting: 114/62  Standing:    SaO2: 82% RA after returning from BR, applied 2L oxygen 94%  MODE:  Ambulation: 450 ft   POST:  Rate/Rhythm: 101 ST  BP:  Supine:   Sitting: 128/62  Standing:    SaO2: 96% 2L Tolerated ambulation extremely well with rolling walker and assistance x 1 and 2 L oxygen.  IS encouraged, able to demonstrate and verifies she is using it hourly when awake.  Daughter present and went over sternal precautions and activity restrictions post exercise. [phone]  Liliane Channel RN, BSN 10/19/2017 2:32 PM

## 2017-10-19 NOTE — Progress Notes (Addendum)
      Toni Cooper       Toni Cooper,Toni [place] [id number]             [phone]        3 Days Post-Op Procedure(s) (LRB): MINIMALLY INVASIVE AORTIC VALVE REPLACEMENT WITH PARTIAL STERNOTOMY - using Edwards Intuity size 51mm (N/A) TRANSESOPHAGEAL ECHOCARDIOGRAM (TEE) (N/A)  Subjective: Patient passing flatus but no bowel movement yet.  Objective: Vital signs in last 24 hours: Temp:  [97.6 F (36.4 C)-98.7 F (37.1 C)] 98.4 F (36.9 C) (12/01 0316) Pulse Rate:  [80-86] 86 (12/01 0316) Cardiac Rhythm: Heart block (11/30 1926) Resp:  [18-26] 22 (12/01 0316) BP: (102-145)/(50-64) 145/60 (12/01 0316) SpO2:  [94 %-98 %] 95 % (12/01 0316) Weight:  [224 lb 8 oz (101.8 kg)] 224 lb 8 oz (101.8 kg) (12/01 0316)  Pre op weight 96.2 kg Current Weight  10/19/17 224 lb 8 oz (101.8 kg)      Intake/Output from previous day: 11/30 0701 - 12/01 0700 In: 240 [P.O.:240] Out: -    Physical Exam:  Cardiovascular: RRR, no murmur Pulmonary: Diminished at bases Abdomen: Soft, non tender, bowel sounds present. Extremities: Bilateral lower extremity edema. Wound: Aquacel removed and wound is clean and dry.  No erythema or signs of infection.  Lab Results: CBC: Recent Labs    10/18/17 0323 10/19/17 0245  WBC 16.7* 14.7*  HGB 10.4* 10.3*  HCT 32.1* 32.4*  PLT 121* 148*   BMET:  Recent Labs    10/18/17 0323 10/19/17 0245  NA 134* 138  K 4.4 4.0  CL 104 104  CO2 26 29  GLUCOSE 152* 99  BUN 22* 21*  CREATININE 0.99 0.89  CALCIUM 7.0* 7.4*    PT/INR:  Lab Results  Component Value Date   INR 1.51 10/16/2017   INR 1.01 10/14/2017   INR 1.0 07/17/2017   ABG:  INR: Will add last result for INR, ABG once components are confirmed Will add last 4 CBG results once components are confirmed  Assessment/Plan:  1. CV - First degree heart block, SR in the 70-80's . On Lopressor 12.5 mg bid. Will restart low dose Lisinopril for better BP control. 2.  Pulmonary - On 2  liters of oxygen via [place]. Wean to room air as tolerates. CXR ordered but not taken yet. Encourage incentive spirometer. 3. Volume Overload - On Lasix 40 mg daily but will give IV this am. 4.  Acute blood loss anemia - H and H stable 10.3 and 32.4 this am. Continue Niferex. 5. Mild thrombocytopenia-platelets increased to 148,000 6. DM-CBGs 280/138/101. On Insulin. Was on Metformin XR 500 mg bid so will restart and stop scheduled Insulin. Pre op HGA1C 6.9. 7. Remove EPW 8. LOC in am if no bowel movement-at patient request   Sharalyn Ink ZimmermanPA-C 10/19/2017,7:48 AM  Making good progress, poss home Monday I have seen and examined Toni Cooper and agree with the above assessment  and plan.  Grace Isaac MD Beeper [phone] Office [phone] 10/19/2017 12:00 PM

## 2017-10-20 LAB — GLUCOSE, CAPILLARY
GLUCOSE-CAPILLARY: 149 mg/dL — AB (ref 65–99)
GLUCOSE-CAPILLARY: 208 mg/dL — AB (ref 65–99)
Glucose-Capillary: 128 mg/dL — ABNORMAL HIGH (ref 65–99)
Glucose-Capillary: 223 mg/dL — ABNORMAL HIGH (ref 65–99)

## 2017-10-20 MED ORDER — FUROSEMIDE 40 MG PO TABS: 40.0000 mg | ORAL_TABLET | Freq: Every day | ORAL | Status: DC

## 2017-10-20 MED ORDER — FUROSEMIDE 10 MG/ML IJ SOLN
40.0000 mg | Freq: Once | INTRAMUSCULAR | Status: AC
  Administered 2017-10-20: 40 mg via INTRAVENOUS
  Filled 2017-10-20: qty 4

## 2017-10-20 MED ORDER — LISINOPRIL 10 MG PO TABS
10.0000 mg | ORAL_TABLET | Freq: Every day | ORAL | Status: DC
  Administered 2017-10-20: 10 mg via ORAL
  Filled 2017-10-20: qty 1

## 2017-10-20 NOTE — Progress Notes (Addendum)
      [address]       [place] [address]             [phone]        4 Days Post-Op Procedure(s) (LRB): MINIMALLY INVASIVE AORTIC VALVE REPLACEMENT WITH PARTIAL STERNOTOMY - using Edwards Intuity size 95mm (N/A) TRANSESOPHAGEAL ECHOCARDIOGRAM (TEE) (N/A)  Subjective: Patient with bowel movement this am. She has no other specific complaints.  Objective: Vital signs in last 24 hours: Temp:  [97.9 F (36.6 C)-98.8 F (37.1 C)] 97.9 F (36.6 C) (12/02 0300) Pulse Rate:  [62-87] 87 (12/02 0300) Cardiac Rhythm: Normal sinus rhythm (12/02 0700) Resp:  [13-37] 35 (12/02 0300) BP: (114-138)/(62-68) 134/68 (12/02 0300) SpO2:  [91 %-97 %] 94 % (12/02 0548) Weight:  [223 lb 4.8 oz (101.3 kg)] 223 lb 4.8 oz (101.3 kg) (12/02 0300)  Pre op weight 96.2 kg Current Weight  10/20/17 223 lb 4.8 oz (101.3 kg)      Intake/Output from previous day: 12/01 0701 - 12/02 0700 In: 840 [P.O.:840] Out: 1225 [Urine:1225]   Physical Exam:  Cardiovascular: RRR Pulmonary: Slightly diminished at bases Abdomen: Soft, obese, non tender, bowel sounds present. Extremities: Bilateral lower extremity edema. Wound: Sternal wound is clean and dry.  No erythema or signs of infection.  Lab Results: CBC: Recent Labs    10/18/17 0323 10/19/17 0245  WBC 16.7* 14.7*  HGB 10.4* 10.3*  HCT 32.1* 32.4*  PLT 121* 148*   BMET:  Recent Labs    10/18/17 0323 10/19/17 0245  NA 134* 138  K 4.4 4.0  CL 104 104  CO2 26 29  GLUCOSE 152* 99  BUN 22* 21*  CREATININE 0.99 0.89  CALCIUM 7.0* 7.4*    PT/INR:  Lab Results  Component Value Date   INR 1.51 10/16/2017   INR 1.01 10/14/2017   INR 1.0 07/17/2017   ABG:  INR: Will add last result for INR, ABG once components are confirmed Will add last 4 CBG results once components are confirmed  Assessment/Plan:  1. CV - First degree heart block, SR in the 80's . On Lopressor 12.5 mg bid and Lisinopril 2.5 mg daily. Will  increase Lisinopril for better BP control. 2.  Pulmonary - On room air. Encourage incentive spirometer. 3. Volume Overload - On Lasix 40 mg daily. Will give IV again as had better diuresis than with oral. 4.  Acute blood loss anemia - H and H stable 10.3 and 32.4 this am. Continue Niferex. 5. Mild thrombocytopenia-platelets increased to 148,000 6. DM-CBGs 278/175/128.  Continue Metformin XR 500 mg bid and Insulin PRN. Pre op HGA1C 6.9. 7. Possible discharge in am   Donielle M ZimmermanPA-C 10/20/2017,7:38 AM  Patient improving, walking around in room without difficulty  Plan d/c home in am if glucose controlled  I have seen and examined Toni Cooper and agree with the above assessment  and plan.  Grace Isaac MD Beeper [phone] Office [phone] 10/20/2017 11:47 AM

## 2017-10-21 LAB — GLUCOSE, CAPILLARY
GLUCOSE-CAPILLARY: 175 mg/dL — AB (ref 65–99)
GLUCOSE-CAPILLARY: 223 mg/dL — AB (ref 65–99)

## 2017-10-21 MED ORDER — METOPROLOL TARTRATE 25 MG PO TABS: 12.5000 mg | ORAL_TABLET | Freq: Two times a day (BID) | ORAL | 1 refills | Status: DC

## 2017-10-21 MED ORDER — MAGNESIUM OXIDE 400 (241.3 MG) MG PO TABS
200.0000 mg | ORAL_TABLET | Freq: Every day | ORAL | Status: DC
  Administered 2017-10-21: 200 mg via ORAL
  Filled 2017-10-21: qty 1

## 2017-10-21 MED ORDER — ATORVASTATIN CALCIUM 10 MG PO TABS: 10.0000 mg | ORAL_TABLET | Freq: Every day | ORAL | 1 refills | Status: DC

## 2017-10-21 MED ORDER — OXYCODONE HCL 5 MG PO TABS: 5.0000 mg | ORAL_TABLET | ORAL | 0 refills | Status: DC | PRN

## 2017-10-21 MED ORDER — LISINOPRIL 10 MG PO TABS
10.0000 mg | ORAL_TABLET | Freq: Every day | ORAL | Status: DC
  Administered 2017-10-21: 10 mg via ORAL
  Filled 2017-10-21: qty 1

## 2017-10-21 MED ORDER — HYDROCHLOROTHIAZIDE 25 MG PO TABS
25.0000 mg | ORAL_TABLET | Freq: Every day | ORAL | Status: DC
  Administered 2017-10-21: 25 mg via ORAL
  Filled 2017-10-21: qty 1

## 2017-10-21 MED ORDER — ASPIRIN 325 MG PO TBEC: 325.0000 mg | DELAYED_RELEASE_TABLET | Freq: Every day | ORAL | 0 refills | Status: DC

## 2017-10-21 MED ORDER — LISINOPRIL 20 MG PO TABS: 20.0000 mg | ORAL_TABLET | Freq: Every day | ORAL | 1 refills | Status: DC

## 2017-10-21 MED ORDER — CALCIUM CARBONATE-VITAMIN D 500-200 MG-UNIT PO TABS
1.0000 | ORAL_TABLET | Freq: Two times a day (BID) | ORAL | Status: DC
  Administered 2017-10-21: 1 via ORAL
  Filled 2017-10-21: qty 1

## 2017-10-21 MED ORDER — LISINOPRIL 10 MG PO TABS: 10.0000 mg | ORAL_TABLET | Freq: Every day | ORAL | 1 refills | Status: DC

## 2017-10-21 MED ORDER — ADULT MULTIVITAMIN W/MINERALS CH
1.0000 | ORAL_TABLET | Freq: Every day | ORAL | Status: DC
  Administered 2017-10-21: 1 via ORAL
  Filled 2017-10-21: qty 1

## 2017-10-21 MED ORDER — METFORMIN HCL ER 500 MG PO TB24
500.0000 mg | ORAL_TABLET | Freq: Two times a day (BID) | ORAL | Status: DC
  Administered 2017-10-21: 500 mg via ORAL
  Filled 2017-10-21 (×2): qty 1

## 2017-10-21 MED ORDER — FUROSEMIDE 10 MG/ML IJ SOLN
40.0000 mg | Freq: Once | INTRAMUSCULAR | Status: AC
  Administered 2017-10-21: 40 mg via INTRAVENOUS
  Filled 2017-10-21: qty 4

## 2017-10-21 MED ORDER — VITAMIN C 500 MG PO TABS
1000.0000 mg | ORAL_TABLET | Freq: Every day | ORAL | Status: DC
  Administered 2017-10-21: 1000 mg via ORAL
  Filled 2017-10-21: qty 2

## 2017-10-21 MED ORDER — PROSIGHT PO TABS
1.0000 | ORAL_TABLET | Freq: Every day | ORAL | Status: DC
  Administered 2017-10-21: 1 via ORAL
  Filled 2017-10-21: qty 1

## 2017-10-21 NOTE — Progress Notes (Addendum)
      [address]       [place] [address]             [phone]      5 Days Post-Op Procedure(s) (LRB): MINIMALLY INVASIVE AORTIC VALVE REPLACEMENT WITH PARTIAL STERNOTOMY - using Edwards Intuity size 44mm (N/A) TRANSESOPHAGEAL ECHOCARDIOGRAM (TEE) (N/A) Subjective: Feels okay this morning. Eating her breakfast.   Objective: Vital signs in last 24 hours: Temp:  [97.9 F (36.6 C)-98.3 F (36.8 C)] 97.9 F (36.6 C) (12/03 0500) Pulse Rate:  [87-95] 95 (12/03 0500) Cardiac Rhythm: Normal sinus rhythm;Bundle branch block (12/03 0700) Resp:  [11-22] 18 (12/03 0500) BP: (136-149)/(68-73) 149/73 (12/03 0500) SpO2:  [93 %-97 %] 97 % (12/03 0500) Weight:  [218 lb 9.6 oz (99.2 kg)] 218 lb 9.6 oz (99.2 kg) (12/03 0500)     Intake/Output from previous day: 12/02 0701 - 12/03 0700 In: 1020 [P.O.:1020] Out: 1602 [Urine:1600; Stool:2] Intake/Output this shift: No intake/output data recorded.  General appearance: alert, cooperative and no distress Heart: regular rate and rhythm, S1, S2 normal, no murmur, click, rub or gallop Lungs: clear to auscultation bilaterally Abdomen: soft, non-tender; bowel sounds normal; no masses,  no organomegaly Extremities: 1-2+ pitting pedal edema in bilateral lower extremities Wound: clean and dry  Lab Results: Recent Labs    10/19/17 0245  WBC 14.7*  HGB 10.3*  HCT 32.4*  PLT 148*   BMET:  Recent Labs    10/19/17 0245  NA 138  K 4.0  CL 104  CO2 29  GLUCOSE 99  BUN 21*  CREATININE 0.89  CALCIUM 7.4*    PT/INR: No results for input(s): LABPROT, INR in the last 72 hours. ABG    Component Value Date/Time   PHART 7.360 10/17/2017 0327   HCO3 24.6 10/17/2017 0327   TCO2 24 10/17/2017 1641   ACIDBASEDEF 0.2 10/17/2017 0327   O2SAT 94.4 10/17/2017 0327   CBG (last 3)  Recent Labs    10/20/17 1606 10/20/17 2116 10/21/17 0603  GLUCAP 149* 208* 175*    Assessment/Plan: S/P Procedure(s) (LRB): MINIMALLY  INVASIVE AORTIC VALVE REPLACEMENT WITH PARTIAL STERNOTOMY - using Edwards Intuity size 30mm (N/A) TRANSESOPHAGEAL ECHOCARDIOGRAM (TEE) (N/A)  1. CV-NSR in the 80s, BP overall well controlled. On Lopressor 12.5 mg bid and Lisinopril 10 mg daily. 2. Pulm-On room air. Encourage incentive spirometer. 3. Renal- weight continues to trend down however, remains 6lbs fluid overloaded. One dose of IV Lasix yesterday. Will give another dose today. Will order a bmp for the morning since no labs for a few days.  4. Acute blood loss anemia-H and H stable 5. Thrombocytopenia-improving. Last platelet count is 148k 6. DM-CBGs 223/149/208/175. Taking Metformin XR 500mg  BID and Insulin PRN. Will need outpatient follow-up diabetes control.   Plan: Another dose of IV lasix for fluid overload. Remains 6 lbs over baseline weight. Blood glucose control remains challenging however, patient shares she does better with control at home. Will need close follow-up. Home later today vs. Tomorrow.     LOS: 5 days    Elgie Collard 10/21/2017  I have seen and examined the patient and agree with the assessment and plan as outlined.  Plan D/C home later today.  Continue lisinopril at reduced dose plus low dose metoprolol.  Resume hydrodiuril.  Resume metformin at preop dose.  Rexene Alberts, MD 10/21/2017 8:52 AM

## 2017-10-21 NOTE — Care Management Note (Signed)
Case Management Note Original Note Created Zenon Mayo, RN 10/18/2017, 11:03 AM   Patient Details  Name: Toni Cooper MRN: [id number] Date of Birth: 1946/05/20  Subjective/Objective:     From home with spouse who can assist patient 24/7  At home, she does not use any assistive devices at home to ambulate.  She has PCP and medication coverage. She is POD 2 AVR , partial sternotomy, dc chest tubes, cont to diuresis.                Action/Plan: NCM will follow for dc needs.   Expected Discharge Date:  10/21/17               Expected Discharge Plan:  Home/Self Care  In-House Referral:     Discharge planning Services  CM Consult  Post Acute Care Choice:    Choice offered to:     DME Arranged:    DME Agency:     HH Arranged:    HH Agency:     Status of Service:  Completed, signed off  If discussed at [employer] of Stay Meetings, dates discussed:    Discharge Disposition: home/self care  Additional Comments:  10/21/17- 1430- Toni Fyfe RN, CM- pt for d/c home today- no CM needs noted for discharge- pt to transition home.   Dahlia Client [place], RN 10/21/2017, 2:36 PM [phone]

## 2017-10-21 NOTE — Care Management Important Message (Signed)
Important Message  Patient Details  Name: Toni Cooper MRN: [id number] Date of Birth: 1946/09/24   Medicare Important Message Given:  Yes    Nathen May 10/21/2017, 10:41 AM

## 2017-10-21 NOTE — Progress Notes (Signed)
CARDIAC REHAB PHASE I   PRE:  Rate/Rhythm: 102 ST    BP: sitting 130/54    SaO2: 95 RA  MODE:  Ambulation: 370 ft   POST:  Rate/Rhythm: 120 ST with PVCs    BP: sitting 174/71     SaO2: 95 RA  Pt stood independently and ambulated with supervision assist. No RW needed although pt sts that she fatigued quicker without the RW. She has a RW at home if she decides to use it for stamina. Ed completed with good reception. Will refer to [place]. Set up d/c video. [place], ACSM 10/21/2017 9:19 AM

## 2017-10-21 NOTE — Discharge Instructions (Signed)
Activity: 1.May walk up steps                2.No lifting more than ten pounds for two weeks.                 3.No driving for two weeks, as long as NOT taking any narcotics for pain.                4.Stop any activity that causes chest pain, shortness of breath, dizziness, sweating or excessive weakness.                5.Avoid straining.                6.Continue with your breathing exercises daily.  Diet: Diabetic diet and Low fat, Low salt diet  Wound Care: May shower.  Clean wounds with mild soap and water daily. Pat dry with a clean towel. Contact the office at [phone] if any problems arise.

## 2017-11-01 ENCOUNTER — Other Ambulatory Visit: Payer: Self-pay | Admitting: Thoracic Surgery (Cardiothoracic Vascular Surgery)

## 2017-11-01 DIAGNOSIS — R002 Palpitations: Secondary | ICD-10-CM

## 2017-11-01 DIAGNOSIS — Z952 Presence of prosthetic heart valve: Secondary | ICD-10-CM

## 2017-11-01 NOTE — Progress Notes (Unsigned)
cr

## 2017-11-04 ENCOUNTER — Other Ambulatory Visit: Payer: Self-pay

## 2017-11-04 ENCOUNTER — Ambulatory Visit
Admission: RE | Admit: 2017-11-04 | Discharge: 2017-11-04 | Disposition: A | Discharge status: Home / Self Care | Payer: Self-pay | Source: Ambulatory Visit | Attending: Thoracic Surgery (Cardiothoracic Vascular Surgery) | Admitting: Thoracic Surgery (Cardiothoracic Vascular Surgery)

## 2017-11-04 ENCOUNTER — Ambulatory Visit (INDEPENDENT_AMBULATORY_CARE_PROVIDER_SITE_OTHER): Payer: Self-pay | Admitting: Surgical

## 2017-11-04 DIAGNOSIS — I060 Rheumatic aortic stenosis: Secondary | ICD-10-CM

## 2017-11-04 DIAGNOSIS — Z952 Presence of prosthetic heart valve: Secondary | ICD-10-CM

## 2017-11-04 NOTE — Patient Instructions (Signed)
Discussed driving and activity progression protocols.

## 2017-11-04 NOTE — Progress Notes (Signed)
**Note Toni-Identified via Obfuscation** Toni Cooper       ,Toni Cooper [id number]             [phone]      [name] Medical Record #[id number] Date of Birth: 05/14/1946  Referring: [name], MD Primary Care: [name], FNP  Chief Complaint:   POST OP FOLLOW UP NOTE  Date of Procedure:                10/16/2017  Preoperative Diagnosis:      Severe Aortic Stenosis   Postoperative Diagnosis:    Same   Procedure:        Minimally-Invasive Aortic Valve Replacement             Partial upper mini-sternotomy             Edwards Intuity Elite rapid deployment bovine pericardial tissue valve (size 23 mm, model # 8300AB, serial # [id number])              Surgeon:        [name]. [name], MD  Assistant:       Nicholes Rough, PA-C  Anesthesia:    [name], MD  Operative Findings: ? Bicuspid aortic valve (Sievers type I) with severe aortic stenosis ? Normal left ventricular systolic function    History of Present Illness:    The patient is a 71 year old female status post the above described procedures seen in the office on today's date in routine follow-up.  Overall she reports that she is feeling well without specific complaints.  She is monitoring her blood pressure, temperature and blood sugars very closely.  She denies significant shortness of breath and only takes occasional pain pill, usually at night.  She has had no significant difficulties with her sternal incision although there is some slight inflammation associated with the chest tube site incisions.  Sutures were removed today's date.  She is ambulating well and has increased her duration and is scheduled to start cardiac rehabilitation in January.  She has had no fevers, chills or other constitutional symptoms.      Past Medical History:  Diagnosis Date  . Aortic stenosis, severe 2015  . Cataract   . Diabetes mellitus without complication ([place])   . Dyspnea   . Hypertension   . Obesity   .  Osteoporosis   . S/P minimally invasive aortic valve replacement with bioprosthetic valve 10/16/2017   23 mm [employer] rapid deployment bovine stented bioprosthetic tissue valve via partial upper sternotomy     Social History   Tobacco Use  Smoking Status Never Smoker  Smokeless Tobacco Never Used    Social History   Substance and Sexual Activity  Alcohol Use No  . Alcohol/week: 0.0 oz     Allergies  Allergen Reactions  . Sulfa Antibiotics Nausea And Vomiting    Current Outpatient Medications  Medication Sig Dispense Refill  . acetaminophen (TYLENOL) 500 MG tablet Take 500 mg 2 (two) times daily as needed by mouth for moderate pain or headache.     . Ascorbic Acid (VITAMIN C) 1000 MG tablet Take 1,000 mg daily by mouth.    [name] aspirin EC 325 MG EC tablet Take 1 tablet (325 mg total) by mouth daily. 30 tablet 0  . atorvastatin (LIPITOR) 10 MG tablet Take 1 tablet (10 mg total) by mouth daily at 6 PM. 30 tablet 1  . beta carotene w/minerals (OCUVITE) tablet Take 1 tablet by  mouth daily.    . calcium carbonate (TUMS - DOSED IN MG ELEMENTAL CALCIUM) 500 MG chewable tablet Chew 1 tablet by mouth daily as needed for indigestion or heartburn.    . calcium-vitamin D (OSCAL 500/200 D-3) 500-200 MG-UNIT tablet Take 1 tablet by mouth 2 (two) times daily.    . cholecalciferol (VITAMIN D) 1000 UNITS tablet Take 1,000 Units by mouth daily.    . fluticasone (FLONASE) 50 MCG/ACT nasal spray Place 1 spray into both nostrils daily as needed for allergies or rhinitis.    [name] HUMALOG 100 UNIT/ML injection Inject 6-20 Units into the skin 3 (three) times daily with meals. Using sliding scale.    . hydrochlorothiazide (HYDRODIURIL) 25 MG tablet Take 1 tablet (25 mg total) daily by mouth. 90 tablet 1  . LANTUS 100 UNIT/ML injection Inject 35 Units into the skin at bedtime.     [name] lisinopril (PRINIVIL,ZESTRIL) 20 MG tablet Take 1 tablet (20 mg total) by mouth daily. 30 tablet 1  . Magnesium 250  MG TABS Take 250 mg by mouth daily.     . metFORMIN (GLUCOPHAGE-XR) 500 MG 24 hr tablet Take 1 tablet (500 mg total) 2 (two) times daily by mouth. 180 tablet 1  . metoprolol tartrate (LOPRESSOR) 25 MG tablet Take 0.5 tablets (12.5 mg total) by mouth 2 (two) times daily. 60 tablet 1  . Multiple Vitamins-Minerals (MULTI-BETIC DIABETES) TABS Take 1 tablet daily by mouth.    . oxyCODONE (OXY IR/ROXICODONE) 5 MG immediate release tablet Take 1 tablet (5 mg total) by mouth every 4 (four) hours as needed for severe pain. 30 tablet 0  . pyridOXINE (VITAMIN B-6) 100 MG tablet Take 100 mg by mouth daily.    [name] TEST test strip Test four times daily.    [name] alendronate (FOSAMAX) 70 MG tablet Take 1 tablet (70 mg total) by mouth every 7 (seven) days. Take with a full glass of water on an empty stomach. (Patient not taking: Reported on 11/04/2017) 12 tablet 3   No current facility-administered medications for this visit.        Physical Exam: BP 127/70 (BP Location: Right Arm, Patient Position: Sitting, Cuff Size: Large)   Pulse 90   Ht 5\' 4"  (1.626 m)   Wt 200 lb (90.7 kg)   SpO2 98%   BMI 34.33 kg/m   General appearance: alert, cooperative and no distress Heart: regular rate and rhythm Lungs: mildly dim in bases Abdomen: obese, non tender  Extremities: + minor BLE edema Wound: incis healing well, CT sites under right breast mildly inflamed without drainage  * soft systolic aortic Murmur Diagnostic Studies & Laboratory data:     Recent Radiology Findings:   Dg Chest 2 View  Result Date: 11/04/2017 CLINICAL DATA:  Status post AVR EXAM: CHEST  2 VIEW COMPARISON:  10/19/2017, 10/18/2017, 10/14/2017 FINDINGS: Partially visualize right shoulder replacement. Post sternotomy changes and aortic valve replacement. Decreased pleural effusions since the previous examination. No focal pulmonary infiltrate is seen. There is improved aeration of the bilateral lung bases. Stable mild cardiomegaly.  No pneumothorax. Stable wedging of midthoracic vertebra. Small hiatal hernia. IMPRESSION: 1. Decreased pleural effusion since the previous exam with improved aeration of the bilateral lung bases. 2. Borderline to mild cardiomegaly Electronically Signed   By: Donavan Foil M.D.   On: 11/04/2017 14:35      Recent Lab Findings: Lab Results  Component Value Date   WBC 14.7 (H) 10/19/2017   HGB 10.3 (L) 10/19/2017  HCT 32.4 (L) 10/19/2017   PLT 148 (L) 10/19/2017   GLUCOSE 99 10/19/2017   CHOL 98 (L) 04/04/2017   TRIG 54 04/04/2017   HDL 38 (L) 04/04/2017   LDLCALC 49 04/04/2017   ALT 18 10/14/2017   AST 24 10/14/2017   NA 138 10/19/2017   K 4.0 10/19/2017   CL 104 10/19/2017   CREATININE 0.89 10/19/2017   BUN 21 (H) 10/19/2017   CO2 29 10/19/2017   INR 1.51 10/16/2017   HGBA1C 6.9 (H) 10/14/2017      Assessment / Plan: The patient is doing quite well.  We discussed lifestyle changes and nutritional strategies to hopefully gain a little bit better control of her diabetes which is only managed fair.  We will see her again in 2 months in follow-up and she will continue to be monitored with periodic cardiology appointments as well.          John Giovanni, PA-C 11/04/2017 3:16 PM

## 2017-11-04 NOTE — Assessment & Plan Note (Signed)
Excellent recovery following Intuity aortic valve replacement.

## 2017-11-05 ENCOUNTER — Ambulatory Visit: Payer: Medicare Other | Admitting: Physician Assistant

## 2017-11-05 ENCOUNTER — Encounter: Payer: Self-pay | Admitting: Physician Assistant

## 2017-11-05 VITALS — BP 128/72 | HR 77 | Ht 64.0 in | Wt 204.2 lb

## 2017-11-05 DIAGNOSIS — I10 Essential (primary) hypertension: Secondary | ICD-10-CM

## 2017-11-05 DIAGNOSIS — I350 Nonrheumatic aortic (valve) stenosis: Secondary | ICD-10-CM | POA: Diagnosis not present

## 2017-11-05 MED ORDER — LISINOPRIL 20 MG PO TABS: 20.0000 mg | ORAL_TABLET | Freq: Every day | ORAL | 3 refills | Status: DC

## 2017-11-05 MED ORDER — METOPROLOL TARTRATE 25 MG PO TABS: 25.0000 mg | ORAL_TABLET | Freq: Two times a day (BID) | ORAL | 3 refills | Status: DC

## 2017-11-05 NOTE — Progress Notes (Signed)
Cardiology Office Note   Date:  11/05/2017   ID:  REVE CROCKET, DOB [birth year], MRN [id number]  PCP:  [name], FNP  Cardiologist: Dr. Percival Spanish, 08/22/2017 Rosaria Ferries, PA-C   Chief Complaint  Patient presents with  . Follow-up    History of Present Illness: Toni Cooper is a 71 y.o. female with a history of severe AS, DM, HTN, osteoporosis, obesity, nl cors at cath 07/25/2017  Admitted 11/28-12/01/2017 for minimally invasive aortic valve replacement with bovine pericardial tissue valve  [name] presents for cardiology follow up.  She still has some pain from the surgery, controlled w/ rx.  Her weight is trending down, she has lost some fluid, but some actual weight.   Her blood sugar was up last pm and this am, she wonders if this is related to stress.  No palpitations, no presyncope or syncope.  Her legs were swelling, this has improved. Denies orthopnea or PND.  She has gotten up to 15 minutes at a time walking, but that is about all she can do at this time.   However, she recognizes that she is better off since the surgery.  She is breathing better since the surgery and is hopeful that she can increase her stamina.   Past Medical History:  Diagnosis Date  . Aortic stenosis, severe 2015  . Cataract   . Diabetes mellitus without complication ([place])   . Dyspnea   . Hypertension   . Obesity   . Osteoporosis   . S/P minimally invasive aortic valve replacement with bioprosthetic valve 10/16/2017   23 mm [employer] Intuity Elite rapid deployment bovine stented bioprosthetic tissue valve via partial upper sternotomy    Past Surgical History:  Procedure Laterality Date  . ABDOMINAL HYSTERECTOMY    . APPENDECTOMY    . FRACTURE SURGERY     shoulder  . TEE WITHOUT CARDIOVERSION N/A 04/18/2016   Procedure: TRANSESOPHAGEAL ECHOCARDIOGRAM (TEE);  Surgeon: Thayer Headings, MD;  Location: [facility];  Service: Cardiovascular;  Laterality: N/A;  . TEE  WITHOUT CARDIOVERSION N/A 10/16/2017   Procedure: TRANSESOPHAGEAL ECHOCARDIOGRAM (TEE);  Surgeon: Rexene Alberts, MD;  Location: [facility];  Service: Open Heart Surgery;  Laterality: N/A;  . WRIST SURGERY      Current Outpatient Medications  Medication Sig Dispense Refill  . acetaminophen (TYLENOL) 500 MG tablet Take 500 mg 2 (two) times daily as needed by mouth for moderate pain or headache.     . Ascorbic Acid (VITAMIN C) 1000 MG tablet Take 1,000 mg daily by mouth.    [name] aspirin EC 325 MG EC tablet Take 1 tablet (325 mg total) by mouth daily. 30 tablet 0  . atorvastatin (LIPITOR) 10 MG tablet Take 1 tablet (10 mg total) by mouth daily at 6 PM. 30 tablet 1  . beta carotene w/minerals (OCUVITE) tablet Take 1 tablet by mouth daily.    . calcium carbonate (TUMS - DOSED IN MG ELEMENTAL CALCIUM) 500 MG chewable tablet Chew 1 tablet by mouth daily as needed for indigestion or heartburn.    . calcium-vitamin D (OSCAL 500/200 D-3) 500-200 MG-UNIT tablet Take 1 tablet by mouth 2 (two) times daily.    . cholecalciferol (VITAMIN D) 1000 UNITS tablet Take 1,000 Units by mouth daily.    . fluticasone (FLONASE) 50 MCG/ACT nasal spray Place 1 spray into both nostrils daily as needed for allergies or rhinitis.    [name] HUMALOG 100 UNIT/ML injection Inject 6-20 Units into the skin 3 (  three) times daily with meals. Using sliding scale.    . hydrochlorothiazide (HYDRODIURIL) 25 MG tablet Take 1 tablet (25 mg total) daily by mouth. 90 tablet 1  . LANTUS 100 UNIT/ML injection Inject 35 Units into the skin at bedtime.     [name] lisinopril (PRINIVIL,ZESTRIL) 20 MG tablet Take 1 tablet (20 mg total) by mouth daily. 30 tablet 1  . Magnesium 250 MG TABS Take 250 mg by mouth daily.     . metFORMIN (GLUCOPHAGE-XR) 500 MG 24 hr tablet Take 1 tablet (500 mg total) 2 (two) times daily by mouth. 180 tablet 1  . metoprolol tartrate (LOPRESSOR) 25 MG tablet Take 0.5 tablets (12.5 mg total) by mouth 2 (two) times daily. 60 tablet 1  .  Multiple Vitamins-Minerals (MULTI-BETIC DIABETES) TABS Take 1 tablet daily by mouth.    . oxyCODONE (OXY IR/ROXICODONE) 5 MG immediate release tablet Take 1 tablet (5 mg total) by mouth every 4 (four) hours as needed for severe pain. 30 tablet 0  . pyridOXINE (VITAMIN B-6) 100 MG tablet Take 100 mg by mouth daily.    [name] TEST test strip Test four times daily.     No current facility-administered medications for this visit.     Allergies:   Sulfa antibiotics    Social History:  The patient  reports that  has never smoked. she has never used smokeless tobacco. She reports that she does not drink alcohol or use drugs.   Family History:  The patient's family history includes Diabetes in her father, paternal aunt, and paternal uncle; Heart attack in her maternal grandmother and paternal grandmother; Heart failure in her mother; Retinopathy of prematurity in her father; Stroke (age of onset: 18) in her father.    ROS:  Please see the history of present illness. All other systems are reviewed and negative.    PHYSICAL EXAM: VS:  BP 128/72   Pulse 77   Ht 5\' 4"  (1.626 m)   Wt 204 lb 3.2 oz (92.6 kg)   BMI 35.05 kg/m  , BMI Body mass index is 35.05 kg/m. GEN: Well nourished, well developed, female in no acute distress  HEENT: normal for age  Neck: Minimal JVD, no carotid bruit, no masses Cardiac: RRR; no murmur, no rubs, or gallops Respiratory: Decreased breath sounds bases bilaterally, normal work of breathing GI: soft, nontender, nondistended, + BS MS: no deformity or atrophy; trace edema; distal pulses are 2+ in all 4 extremities   Skin: warm and dry, no rash; sternal incision is healing well.  1 of her chest tube incisions is open, the stitches were removed yesterday.  There is a minimal amount of greenish drainage, but the tissue looks healthy. Neuro:  Strength and sensation are intact Psych: euthymic mood, full affect    EKG:  EKG is not ordered today.  Recent  Labs: 10/14/2017: ALT 18 10/17/2017: Magnesium 2.4 10/19/2017: BUN 21; Creatinine, Ser 0.89; Hemoglobin 10.3; Platelets 148; Potassium 4.0; Sodium 138    Lipid Panel    Component Value Date/Time   CHOL 98 (L) 04/04/2017 0830   TRIG 54 04/04/2017 0830   TRIG 74 02/25/2014   HDL 38 (L) 04/04/2017 0830   CHOLHDL 2.6 04/04/2017 0830   LDLCALC 49 04/04/2017 0830     Wt Readings from Last 3 Encounters:  11/05/17 204 lb 3.2 oz (92.6 kg)  11/04/17 200 lb (90.7 kg)  10/21/17 218 lb 9.6 oz (99.2 kg)     Other studies Reviewed: Additional studies/ records  that were reviewed today include: Office notes, hospital records and testing.  ASSESSMENT AND PLAN:  1.  Severe aortic stenosis, s/p minimally invasive aortic valve replacement with bovine pericardial tissue valve: No murmur is auscultated and she is healing well.  Continue to increase activity as tolerated.  She is encouraged to keep a dry dressing and antibiotic ointment over the chest tube incision that is open.  Her husband is helping to keep an eye on that.  She is to call the surgeon's office if she begins running fevers, or develops purulent drainage.  Check an echo in 3 months, prior to her follow-up with Dr. Percival Spanish  2.  Hypertension: Increase metoprolol to 25 mg twice daily.  Current medicines are reviewed at length with the patient today.  The patient does not have concerns regarding medicines.  The following changes have been made: Increase metoprolol  Labs/ tests ordered today include:   Orders Placed This Encounter  Procedures  . ECHOCARDIOGRAM COMPLETE     Disposition:   FU with Dr. Percival Spanish  Signed, Rosaria Ferries, PA-C  11/05/2017 2:54 PM    [place] Phone: [phone]; Fax: [phone]  This note was written with the assistance of speech recognition software. Please excuse any transcriptional errors.

## 2017-11-05 NOTE — Patient Instructions (Signed)
Medication Instructions: INCREASE the Metoprolol to 25 mg tablet twice daily  If you need a refill on your cardiac medications before your next appointment, please call your pharmacy.    Procedures/Testing: Your physician has requested that you have an echocardiogram in 3 months. Echocardiography is a painless test that uses sound waves to create images of your heart. It provides your doctor with information about the size and shape of your heart and how well your heart's chambers and valves are working. This procedure takes approximately one hour. There are no restrictions for this procedure. This will be done at [facility], [address].    Follow-Up: Your physician wants you to follow-up in: 3 months in with Dr. Percival Spanish in [place]. You will receive a reminder letter in the mail two months in advance. If you don't receive a letter, please call our office at [phone] to schedule this follow-up appointment.   Special Instructions:  *Please call your surgeon or PCP if you develop a fever.  *Please check your site for increased drainage. You may change the dressing with a non stick dressing and an antibiotic cream.    Thank you for choosing [facility] at [facility]!!

## 2017-11-13 ENCOUNTER — Ambulatory Visit (INDEPENDENT_AMBULATORY_CARE_PROVIDER_SITE_OTHER): Payer: Self-pay | Admitting: Surgical

## 2017-11-13 ENCOUNTER — Other Ambulatory Visit: Payer: Self-pay

## 2017-11-13 VITALS — BP 151/71 | HR 76 | Resp 16 | Ht 64.0 in | Wt 201.2 lb

## 2017-11-13 DIAGNOSIS — I350 Nonrheumatic aortic (valve) stenosis: Secondary | ICD-10-CM

## 2017-11-13 DIAGNOSIS — T8189XD Other complications of procedures, not elsewhere classified, subsequent encounter: Secondary | ICD-10-CM

## 2017-11-13 DIAGNOSIS — Z952 Presence of prosthetic heart valve: Secondary | ICD-10-CM

## 2017-11-13 NOTE — Patient Instructions (Signed)
Bid dressing changes as instructed

## 2017-11-13 NOTE — Progress Notes (Signed)
[address]       [place] [address]             [phone]      Toni Cooper [facility] [facility] Medical Record #[id number] Date of Birth: Jan 14, 1946  Referring: [name], * Primary Care: [name], FNP Primary Cardiologist: No primary care provider on file.   Chief Complaint:   POST OP FOLLOW UP POST OP FOLLOW UP NOTE  Date of Procedure:10/16/2017  Preoperative Diagnosis:Severe Aortic Stenosis   Postoperative Diagnosis:Same   Procedure:   Minimally-InvasiveAortic Valve Replacement Partial upper mini-sternotomy [employer] Intuity Elite rapid deployment bovine pericardial tissue valve (size73mm, model # 2992EQ, serial # [id number])  Surgeon:Clarence H. [name], MD  Assistant:Tessa Harriet Pho, PA-C  Anesthesia:Kevin Conrad [place], MD  Operative Findings: ? Bicuspid aortic valve (Sievers type I) withsevere aortic stenosis ? Normalleft ventricular systolic function   History of Present Illness:    Patient continues to have some difficulty in the healing of her chest tube sites below her right breast.  She has large breasts and the area tends to be quite moist.  She is keeping a Telfa gauze between the breast and the chest tube sites continues to have some discomfort      Past Medical History:  Diagnosis Date  . Aortic stenosis, severe 2015  . Cataract   . Diabetes mellitus without complication ([place])   . Dyspnea   . Hypertension   . Obesity   . Osteoporosis   . S/P minimally invasive aortic valve replacement with bioprosthetic valve 10/16/2017   23 mm [employer] rapid deployment bovine stented bioprosthetic tissue valve via partial upper sternotomy     Social History   Tobacco Use  Smoking Status Never Smoker  Smokeless Tobacco Never Used    Social History   Substance and Sexual Activity  Alcohol Use No  . Alcohol/week: 0.0 oz      Allergies  Allergen Reactions  . Sulfa Antibiotics Nausea And Vomiting    Current Outpatient Medications  Medication Sig Dispense Refill  . acetaminophen (TYLENOL) 500 MG tablet Take 500 mg 2 (two) times daily as needed by mouth for moderate pain or headache.     . Ascorbic Acid (VITAMIN C) 1000 MG tablet Take 1,000 mg daily by mouth.    [name] aspirin EC 325 MG EC tablet Take 1 tablet (325 mg total) by mouth daily. 30 tablet 0  . atorvastatin (LIPITOR) 10 MG tablet Take 1 tablet (10 mg total) by mouth daily at 6 PM. 30 tablet 1  . beta carotene w/minerals (OCUVITE) tablet Take 1 tablet by mouth daily.    . calcium carbonate (TUMS - DOSED IN MG ELEMENTAL CALCIUM) 500 MG chewable tablet Chew 1 tablet by mouth daily as needed for indigestion or heartburn.    . calcium-vitamin D (OSCAL 500/200 D-3) 500-200 MG-UNIT tablet Take 1 tablet by mouth 2 (two) times daily.    . cholecalciferol (VITAMIN D) 1000 UNITS tablet Take 1,000 Units by mouth daily.    . fluticasone (FLONASE) 50 MCG/ACT nasal spray Place 1 spray into both nostrils daily as needed for allergies or rhinitis.    [name] HUMALOG 100 UNIT/ML injection Inject 6-20 Units into the skin 3 (three) times daily with meals. Using sliding scale.    . hydrochlorothiazide (HYDRODIURIL) 25 MG tablet Take 1 tablet (25 mg total) daily by mouth. 90 tablet 1  . LANTUS 100 UNIT/ML injection Inject 35 Units into the skin at bedtime.     [name]  lisinopril (PRINIVIL,ZESTRIL) 20 MG tablet Take 1 tablet (20 mg total) by mouth daily. 90 tablet 3  . Magnesium 250 MG TABS Take 250 mg by mouth daily.     . metFORMIN (GLUCOPHAGE-XR) 500 MG 24 hr tablet Take 1 tablet (500 mg total) 2 (two) times daily by mouth. 180 tablet 1  . metoprolol tartrate (LOPRESSOR) 25 MG tablet Take 1 tablet (25 mg total) by mouth 2 (two) times daily. 180 tablet 3  . Multiple Vitamins-Minerals (MULTI-BETIC DIABETES) TABS Take 1 tablet daily by mouth.    . pyridOXINE (VITAMIN B-6) 100 MG tablet  Take 100 mg by mouth daily.    [name] TEST test strip Test four times daily.    [name] oxyCODONE (OXY IR/ROXICODONE) 5 MG immediate release tablet Take 1 tablet (5 mg total) by mouth every 4 (four) hours as needed for severe pain. (Patient not taking: Reported on 11/13/2017) 30 tablet 0   No current facility-administered medications for this visit.        Physical Exam: BP (!) 151/71 (BP Location: Left Arm, Patient Position: Sitting, Cuff Size: Large)   Pulse 76   Resp 16   Ht 5\' 4"  (1.626 m)   Wt 201 lb 3.2 oz (91.3 kg)   SpO2 97% Comment: ON RA  BMI 34.54 kg/m   Wound: Chest tube sites under the right breast do have some erythematous inflammatory changes but no evidence of purulence or abscess formation.  I did debride small amount of fibrinous appearing material.   Diagnostic Studies & Laboratory data:     Recent Radiology Findings:   No results found.    Recent Lab Findings: Lab Results  Component Value Date   WBC 14.7 (H) 10/19/2017   HGB 10.3 (L) 10/19/2017   HCT 32.4 (L) 10/19/2017   PLT 148 (L) 10/19/2017   GLUCOSE 99 10/19/2017   CHOL 98 (L) 04/04/2017   TRIG 54 04/04/2017   HDL 38 (L) 04/04/2017   LDLCALC 49 04/04/2017   ALT 18 10/14/2017   AST 24 10/14/2017   NA 138 10/19/2017   K 4.0 10/19/2017   CL 104 10/19/2017   CREATININE 0.89 10/19/2017   BUN 21 (H) 10/19/2017   CO2 29 10/19/2017   INR 1.51 10/16/2017   HGBA1C 6.9 (H) 10/14/2017      Assessment / Plan: Chest tube site incisions continue to show poor healing.  In addition to currently using the gauze I instructed her to clean it gently with hydrogen peroxide twice daily.  She does not appear to have any systemic infection.  There is not any local infection or cellulitis currently.  We will see her again in the previously scheduled appointment time or prior to that if the incisions worsen or do not  show signs of healing.          @ME1 @ 11/13/2017 1:41 PM

## 2017-11-18 ENCOUNTER — Telehealth: Payer: Self-pay | Admitting: Cardiology

## 2017-11-18 NOTE — Telephone Encounter (Signed)
New Message   Pt c/o medication issue:  1. Name of Medication: metoprolol tartrate   2. How are you currently taking this medication (dosage and times per day)? 25mg  twice a day  3. Are you having a reaction (difficulty breathing--STAT)? bp dropping   4. What is your medication issue? Patient states that her bp is running low. She states that her heartbeat has been irregular.

## 2017-11-18 NOTE — Telephone Encounter (Signed)
Pt of Dr. Percival Spanish [facility])  Hx Severe aortic stenosis, s/p minimally invasive aortic valve replacement with bovine pericardial tissue valve  She saw Suanne Marker for cardiology f/u after procedure on 12/18. She has also had her cardiothoracic f/u. Notes she still has some drainage from incision site & tissue debrided on her 12/26 cardiothoracic f/u, but no infection noted.  She called today to report concern for her BPs.   12/13 - 145/59 12/14 - 140/60 12/17 - 127/70, 132/56  Metoprolol increased from 12.5mg  to 25mg  BID at 12/18 OV.  She's reported concern for BP & HR trends since then.  On 12/29 HR was 47  BP 125/48 & cuff said "erratic heart beat". She also broke out in a sweat and felt "nauseous". At that time, she cut her metoprolol dose back to 12.5mg  BID and has been taking this dose since then.  12/30 - 126/65 HR 80 12/31 - 128/67 HR 81  She does not wish to go back up on the metoprolol. States if this is just for BP management, she has been on higher dose of lisinopril (40mg  DAILY before surgery, 20mg  DAILY currently) & would rather increase this if needed. Informed her I would route to MD to advise. Pt voiced thanks.

## 2017-11-19 NOTE — Telephone Encounter (Signed)
The last two BP and HR readings were excellent.  No change in therapy.

## 2017-11-20 ENCOUNTER — Encounter: Payer: Self-pay | Admitting: Nurse Practitioner

## 2017-11-20 ENCOUNTER — Ambulatory Visit: Payer: Medicare Other | Admitting: Nurse Practitioner

## 2017-11-20 ENCOUNTER — Telehealth: Payer: Self-pay | Admitting: Nurse Practitioner

## 2017-11-20 VITALS — BP 125/69 | HR 88 | Temp 97.2°F | Ht 64.0 in | Wt 204.0 lb

## 2017-11-20 DIAGNOSIS — L03313 Cellulitis of chest wall: Secondary | ICD-10-CM

## 2017-11-20 MED ORDER — CEPHALEXIN 500 MG PO CAPS: 500.0000 mg | ORAL_CAPSULE | Freq: Four times a day (QID) | ORAL | 0 refills | Status: DC

## 2017-11-20 NOTE — Telephone Encounter (Signed)
Appt given for today per patients request 

## 2017-11-20 NOTE — Telephone Encounter (Signed)
Spoke with pt advised to continue medication therapy and to monitor blood pressure and heart rate.

## 2017-11-20 NOTE — Progress Notes (Signed)
   Subjective:    Patient ID: Toni Cooper, female    DOB: 29-Jan-1946, 72 y.o.   MRN: [id number]  HPI Patient had aortic valve replacement on NOvember 28,2018. She had a drain in after surgery and she wants area looked at. She saw PA at surgeons office last week and she showed area to them and they said that it looked fine. Patient says her husband says it does not look right.    Review of Systems  Constitutional: Negative.   HENT: Negative.   Respiratory: Negative.   Cardiovascular: Negative.   Genitourinary: Negative.   Neurological: Negative.   Psychiatric/Behavioral: Negative.   All other systems reviewed and are negative.      Objective:   Physical Exam  Constitutional: She is oriented to person, place, and time. She appears well-developed and well-nourished. No distress.  Cardiovascular: Normal rate.  Pulmonary/Chest: Effort normal and breath sounds normal.  Neurological: She is alert and oriented to person, place, and time.  Skin: Skin is warm.  5cm annular area of granulation tissue with foul smelling yellowish excudate, right mammory fold.  Psychiatric: She has a normal mood and affect. Her behavior is normal. Judgment and thought content normal.   BP 125/69   Pulse 88   Temp (!) 97.2 F (36.2 C) (Oral)   Ht 5\' 4"  (1.626 m)   Wt 204 lb (92.5 kg)   BMI 35.02 kg/m        Assessment & Plan:  1. Cellulitis of chest wall Meds ordered this encounter  Medications  . cephALEXin (KEFLEX) 500 MG capsule    Sig: Take 1 capsule (500 mg total) by mouth 4 (four) times daily.    Dispense:  40 capsule    Refill:  0    Order Specific Question:   Supervising Provider    Answer:   Eustaquio Maize [[id number]]   Clean bid Keep covered rto if not healing  Mary-Margaret Hassell Done, FNP

## 2017-11-20 NOTE — Telephone Encounter (Signed)
Toni Cooper

## 2017-11-20 NOTE — Telephone Encounter (Signed)
Patient is requesting an apt with MMM- she has a drain under each breast and wants MMM to look at it to make sure it looks okay. Offered an apt for tomorrow but patient would like to come in today. Please advise there are no openings with you today.

## 2017-11-20 NOTE — Patient Instructions (Signed)

## 2017-11-21 ENCOUNTER — Telehealth: Payer: Self-pay | Admitting: Nurse Practitioner

## 2017-11-21 NOTE — Telephone Encounter (Signed)
FYI for Provider, No fever or more redness.  Pain is not as bad.  She will continue to monitor the drain area for any problems and call if she needs to be rechecked.

## 2017-12-03 ENCOUNTER — Encounter (HOSPITAL_COMMUNITY): Payer: Medicare Other

## 2017-12-06 ENCOUNTER — Encounter (HOSPITAL_COMMUNITY)
Admission: RE | Admit: 2017-12-06 | Discharge: 2017-12-06 | Disposition: A | Discharge status: Home / Self Care | Payer: Medicare Other | Source: Ambulatory Visit | Attending: Cardiology | Admitting: Cardiology

## 2017-12-06 VITALS — BP 120/60 | HR 68 | Ht 64.0 in | Wt 206.0 lb

## 2017-12-06 DIAGNOSIS — Z48812 Encounter for surgical aftercare following surgery on the circulatory system: Secondary | ICD-10-CM | POA: Insufficient documentation

## 2017-12-06 DIAGNOSIS — Z953 Presence of xenogenic heart valve: Secondary | ICD-10-CM | POA: Diagnosis not present

## 2017-12-06 NOTE — Progress Notes (Signed)
Daily Session Note  Patient Details  Name: Toni Cooper MRN: [id number] Date of Birth: 11/15/1946 Referring Provider:     CARDIAC REHAB PHASE II ORIENTATION from 12/06/2017 in [facility]  Referring Provider  Dr. Percival Spanish      Encounter Date: 12/06/2017  Check In: Session Check In - 12/06/17 1230      Check-In   Location  [facility]    Staff Present  Tywone Bembenek Angelina Pih, MS, EP, [facility], Exercise Physiologist;Gregory Luther Parody, BS, EP, Exercise Physiologist;Debra Wynetta Emery, RN, BSN    Supervising physician immediately available to respond to emergencies  See telemetry face sheet for immediately available MD    Medication changes reported      No    Fall or balance concerns reported     Yes    Comments  Patient has fallen in the past so she is afraid of falling.     Tobacco Cessation  -- Never smoked    Warm-up and Cool-down  Performed as group-led instruction    Resistance Training Performed  Yes    VAD Patient?  No      Pain Assessment   Currently in Pain?  No/denies    Multiple Pain Sites  No       Capillary Blood Glucose: No results found for this or any previous visit (from the past 24 hour(s)).    Social History   Tobacco Use  Smoking Status Never Smoker  Smokeless Tobacco Never Used    Goals Met:  Independence with exercise equipment Exercise tolerated well No report of cardiac concerns or symptoms Strength training completed today  Goals Unmet:  Not Applicable  Comments: Check out 1415   Dr. Kate Sable is Medical Director for [facility].

## 2017-12-06 NOTE — Progress Notes (Signed)
Cardiac Individual Treatment Plan  Patient Details  Name: Toni Cooper MRN: [id number] Date of Birth: 1945/12/04 Referring Provider:     CARDIAC REHAB [place] from 12/06/2017 in [place]  Referring Provider  Dr. Percival Spanish      Initial Encounter Date:    CARDIAC REHAB PHASE II ORIENTATION from 12/06/2017 in [place]  Date  12/06/17  Referring Provider  Dr. Percival Spanish      Visit Diagnosis: S/P aortic valve replacement with bioprosthetic valve  Patient's Home Medications on Admission:  Current Outpatient Medications:  .  acetaminophen (TYLENOL) 500 MG tablet, Take 500 mg 2 (two) times daily as needed by mouth for moderate pain or headache. , Disp: , Rfl:  .  Ascorbic Acid (VITAMIN C) 1000 MG tablet, Take 1,000 mg daily by mouth., Disp: , Rfl:  .  aspirin EC 325 MG EC tablet, Take 1 tablet (325 mg total) by mouth daily., Disp: 30 tablet, Rfl: 0 .  atorvastatin (LIPITOR) 10 MG tablet, Take 1 tablet (10 mg total) by mouth daily at 6 PM., Disp: 30 tablet, Rfl: 1 .  beta carotene w/minerals (OCUVITE) tablet, Take 1 tablet by mouth daily., Disp: , Rfl:  .  calcium carbonate (TUMS - DOSED IN MG ELEMENTAL CALCIUM) 500 MG chewable tablet, Chew 1 tablet by mouth daily as needed for indigestion or heartburn., Disp: , Rfl:  .  calcium-vitamin D (OSCAL 500/200 D-3) 500-200 MG-UNIT tablet, Take 1 tablet by mouth 2 (two) times daily., Disp: , Rfl:  .  cephALEXin (KEFLEX) 500 MG capsule, Take 1 capsule (500 mg total) by mouth 4 (four) times daily. (Patient not taking: Reported on 12/06/2017), Disp: 40 capsule, Rfl: 0 .  cholecalciferol (VITAMIN D) 1000 UNITS tablet, Take 1,000 Units by mouth daily., Disp: , Rfl:  .  fluticasone (FLONASE) 50 MCG/ACT nasal spray, Place 1 spray into both nostrils daily as needed for allergies or rhinitis., Disp: , Rfl:  .  HUMALOG 100 UNIT/ML injection, Inject 6-20 Units into the skin 3 (three) times daily with meals.  Using sliding scale., Disp: , Rfl:  .  hydrochlorothiazide (HYDRODIURIL) 25 MG tablet, Take 1 tablet (25 mg total) daily by mouth., Disp: 90 tablet, Rfl: 1 .  LANTUS 100 UNIT/ML injection, Inject 35 Units into the skin at bedtime. , Disp: , Rfl:  .  lisinopril (PRINIVIL,ZESTRIL) 20 MG tablet, Take 1 tablet (20 mg total) by mouth daily., Disp: 90 tablet, Rfl: 3 .  Magnesium 250 MG TABS, Take 250 mg by mouth daily. , Disp: , Rfl:  .  metFORMIN (GLUCOPHAGE-XR) 500 MG 24 hr tablet, Take 1 tablet (500 mg total) 2 (two) times daily by mouth., Disp: 180 tablet, Rfl: 1 .  metoprolol tartrate (LOPRESSOR) 12.5 mg TABS tablet, Take 12.5 mg by mouth 2 (two) times daily., Disp: , Rfl:  .  Multiple Vitamins-Minerals (MULTI-BETIC DIABETES) TABS, Take 1 tablet daily by mouth., Disp: , Rfl:  .  oxyCODONE (OXY IR/ROXICODONE) 5 MG immediate release tablet, Take 1 tablet (5 mg total) by mouth every 4 (four) hours as needed for severe pain., Disp: 30 tablet, Rfl: 0 .  pyridOXINE (VITAMIN B-6) 100 MG tablet, Take 100 mg by mouth daily., Disp: , Rfl:  .  TRUETRACK TEST test strip, Test four times daily., Disp: , Rfl:   Past Medical History: Past Medical History:  Diagnosis Date  . Aortic stenosis, severe 2015  . Cataract   . Diabetes mellitus without complication ([place])   .  Dyspnea   . Hypertension   . Obesity   . Osteoporosis   . S/P minimally invasive aortic valve replacement with bioprosthetic valve 10/16/2017   23 mm [employer] rapid deployment bovine stented bioprosthetic tissue valve via partial upper sternotomy    Tobacco Use: Social History   Tobacco Use  Smoking Status Never Smoker  Smokeless Tobacco Never Used    Labs: Recent Review Scientist, physiological    Labs for ITP Cardiac and Pulmonary Rehab Latest Ref Rng & Units 10/16/2017 10/16/2017 10/16/2017 10/16/2017 10/17/2017   Cholestrol 100 - 199 mg/dL - - - - -   LDLCALC 0 - 99 mg/dL - - - - -   HDL >39 mg/dL - - - - -   Trlycerides 0  - 149 mg/dL - - - - -   Hemoglobin A1c 4.8 - 5.6 % - - - - -   PHART 7.350 - 7.450 7.334(L) 7.323(L) - 7.313(L) 7.360   PCO2ART 32.0 - 48.0 mmHg 43.1 44.9 - 44.4 44.6   HCO3 20.0 - 28.0 mmol/L 23.1 23.4 - 22.6 24.6   TCO2 22 - 32 mmol/L 24 25 24 24 24    ACIDBASEDEF 0.0 - 2.0 mmol/L 3.0(H) 3.0(H) - 4.0(H) 0.2   O2SAT % 98.0 96.0 - 94.0 94.4      Capillary Blood Glucose: Lab Results  Component Value Date   GLUCAP 223 (H) 10/21/2017   GLUCAP 175 (H) 10/21/2017   GLUCAP 208 (H) 10/20/2017   GLUCAP 149 (H) 10/20/2017   GLUCAP 223 (H) 10/20/2017     Exercise Target Goals: Date: 12/06/17  Exercise Program Goal: Individual exercise prescription set using results from initial 6 min walk test and THRR while considering  patient's activity barriers and safety.   Exercise Prescription Goal: Starting with aerobic activity 30 plus minutes a day, 3 days per week for initial exercise prescription. Provide home exercise prescription and guidelines that participant acknowledges understanding prior to discharge.  Activity Barriers & Risk Stratification: Activity Barriers & Cardiac Risk Stratification - 12/06/17 1412      Activity Barriers & Cardiac Risk Stratification   Activity Barriers  History of Falls    Cardiac Risk Stratification  High       6 Minute Walk: 6 Minute Walk    Row Name 12/06/17 1418         6 Minute Walk   Phase  Initial     Distance  1000 feet     Distance % Change  0 %     Distance Feet Change  0 ft     Walk Time  6 minutes     # of Rest Breaks  0     MPH  1.89     METS  2.45     RPE  12     Perceived Dyspnea   9     VO2 Peak  6.81     Symptoms  No     Resting HR  68 bpm     Resting BP  120/60     Resting Oxygen Saturation   98 %     Exercise Oxygen Saturation  during 6 min walk  95 %     Max Ex. HR  102 bpm     Max Ex. BP  148/70     2 Minute Post BP  120/66        Oxygen Initial Assessment:   Oxygen Re-Evaluation:   Oxygen Discharge  (Final Oxygen Re-Evaluation):  Initial Exercise Prescription: Initial Exercise Prescription - 12/06/17 1400      Date of Initial Exercise RX and Referring Provider   Date  12/06/17    Referring Provider  Dr. Percival Spanish      Treadmill   MPH  1    Grade  0    Minutes  15    METs  1.7      NuStep   Level  2    SPM  54    Minutes  20    METs  1.6      Prescription Details   Frequency (times per week)  3    Duration  Progress to 30 minutes of continuous aerobic without signs/symptoms of physical distress      Intensity   THRR 40-80% of Max Heartrate  100-116-132    Ratings of Perceived Exertion  11-13    Perceived Dyspnea  0-4      Progression   Progression  Continue progressive overload as per policy without signs/symptoms or physical distress.      Resistance Training   Training Prescription  Yes    Weight  1    Reps  10-15       Perform Capillary Blood Glucose checks as needed.  Exercise Prescription Changes:   Exercise Comments:   Exercise Goals and Review: Exercise Goals    Row Name 12/06/17 1420             Exercise Goals   Increase Physical Activity  Yes       Intervention  Provide advice, education, support and counseling about physical activity/exercise needs.;Develop an individualized exercise prescription for aerobic and resistive training based on initial evaluation findings, risk stratification, comorbidities and participant's personal goals.       Expected Outcomes  Short Term: Attend rehab on a regular basis to increase amount of physical activity.       Increase Strength and Stamina  Yes       Intervention  Develop an individualized exercise prescription for aerobic and resistive training based on initial evaluation findings, risk stratification, comorbidities and participant's personal goals.;Provide advice, education, support and counseling about physical activity/exercise needs.       Expected Outcomes  Short Term: Increase workloads from  initial exercise prescription for resistance, speed, and METs.       Able to understand and use rate of perceived exertion (RPE) scale  Yes       Intervention  Provide education and explanation on how to use RPE scale       Expected Outcomes  Short Term: Able to use RPE daily in rehab to express subjective intensity level;Long Term:  Able to use RPE to guide intensity level when exercising independently       Able to understand and use Dyspnea scale  Yes       Intervention  Provide education and explanation on how to use Dyspnea scale       Expected Outcomes  Short Term: Able to use Dyspnea scale daily in rehab to express subjective sense of shortness of breath during exertion;Long Term: Able to use Dyspnea scale to guide intensity level when exercising independently       Knowledge and understanding of Target Heart Rate Range (THRR)  Yes       Intervention  Provide education and explanation of THRR including how the numbers were predicted and where they are located for reference       Expected Outcomes  Short Term: Able to  state/look up THRR       Able to check pulse independently  Yes       Intervention  Provide education and demonstration on how to check pulse in carotid and radial arteries.;Review the importance of being able to check your own pulse for safety during independent exercise       Expected Outcomes  Short Term: Able to explain why pulse checking is important during independent exercise;Long Term: Able to check pulse independently and accurately       Understanding of Exercise Prescription  Yes       Intervention  Provide education, explanation, and written materials on patient's individual exercise prescription       Expected Outcomes  Short Term: Able to explain program exercise prescription;Long Term: Able to explain home exercise prescription to exercise independently          Exercise Goals Re-Evaluation :    Discharge Exercise Prescription (Final Exercise Prescription  Changes):   Nutrition:  Target Goals: Understanding of nutrition guidelines, daily intake of sodium 1500mg , cholesterol 200mg , calories 30% from fat and 7% or less from saturated fats, daily to have 5 or more servings of fruits and vegetables.  Biometrics: Pre Biometrics - 12/06/17 1420      Pre Biometrics   Height  5\' 4"  (1.626 m)    Weight  206 lb (93.4 kg)    Waist Circumference  47 inches    Hip Circumference  47.5 inches    Waist to Hip Ratio  0.99 %    BMI (Calculated)  35.34    Triceps Skinfold  22 mm    % Body Fat  46.4 %    Grip Strength  38.8 kg    Flexibility  0 in    Single Leg Stand  0 seconds        Nutrition Therapy Plan and Nutrition Goals: Nutrition Therapy & Goals - 12/06/17 1445      Personal Nutrition Goals   Personal Goal #2  She eats heart healthy, low carb, low sugar.     Additional Goals?  No       Nutrition Assessments: Nutrition Assessments - 12/06/17 1445      MEDFICTS Scores   Pre Score  18       Nutrition Goals Re-Evaluation:   Nutrition Goals Discharge (Final Nutrition Goals Re-Evaluation):   Psychosocial: Target Goals: Acknowledge presence or absence of significant depression and/or stress, maximize coping skills, provide positive support system. Participant is able to verbalize types and ability to use techniques and skills needed for reducing stress and depression.  Initial Review & Psychosocial Screening: Initial Psych Review & Screening - 12/06/17 1448      Initial Review   Current issues with  None Identified      Family Dynamics   Good Support System?  Yes      Barriers   Psychosocial barriers to participate in program  There are no identifiable barriers or psychosocial needs.      Screening Interventions   Interventions  Encouraged to exercise    Expected Outcomes  Short Term goal: Identification and review with participant of any Quality of Life or Depression concerns found by scoring the questionnaire.;Long  Term goal: The participant improves quality of Life and PHQ9 Scores as seen by post scores and/or verbalization of changes       Quality of Life Scores: Quality of Life - 12/06/17 1421      Quality of Life Scores   Health/Function Pre  26.58 %    Socioeconomic Pre  29.58 %    Psych/Spiritual Pre  27.43 %    Family Pre  30 %    GLOBAL Pre  27.9 %      Scores of 19 and below usually indicate a poorer quality of life in these areas.  A difference of  2-3 points is a clinically meaningful difference.  A difference of 2-3 points in the total score of the Quality of Life Index has been associated with significant improvement in overall quality of life, self-image, physical symptoms, and general health in studies assessing change in quality of life.  PHQ-9: Recent Review Flowsheet Data    Depression screen [facility] 2/9 12/06/2017 11/20/2017 10/07/2017 04/22/2017 04/04/2017   Decreased Interest 0 0 0 0 0   Down, Depressed, Hopeless 0 0 0 0 0   PHQ - 2 Score 0 0 0 0 0   Altered sleeping 0 - - - -   Tired, decreased energy 0 - - - -   Change in appetite 0 - - - -   Feeling bad or failure about yourself  0 - - - -   Trouble concentrating 0 - - - -   Moving slowly or fidgety/restless 0 - - - -   Suicidal thoughts 0 - - - -   PHQ-9 Score 0 - - - -     Interpretation of Total Score  Total Score Depression Severity:  1-4 = Minimal depression, 5-9 = Mild depression, 10-14 = Moderate depression, 15-19 = Moderately severe depression, 20-27 = Severe depression   Psychosocial Evaluation and Intervention: Psychosocial Evaluation - 12/06/17 1448      Psychosocial Evaluation & Interventions   Interventions  Encouraged to exercise with the program and follow exercise prescription    Continue Psychosocial Services   No Follow up required       Psychosocial Re-Evaluation:   Psychosocial Discharge (Final Psychosocial Re-Evaluation):   Vocational Rehabilitation: Provide vocational rehab assistance to  qualifying candidates.   Vocational Rehab Evaluation & Intervention:   Education: Education Goals: Education classes will be provided on a weekly basis, covering required topics. Participant will state understanding/return demonstration of topics presented.  Learning Barriers/Preferences: Learning Barriers/Preferences - 12/06/17 1441      Learning Barriers/Preferences   Learning Barriers  None    Learning Preferences  Written Material;Pictoral;Video       Education Topics: Hypertension, Hypertension Reduction -Define heart disease and high blood pressure. Discus how high blood pressure affects the body and ways to reduce high blood pressure.   Exercise and Your Heart -Discuss why it is important to exercise, the FITT principles of exercise, normal and abnormal responses to exercise, and how to exercise safely.   Angina -Discuss definition of angina, causes of angina, treatment of angina, and how to decrease risk of having angina.   Cardiac Medications -Review what the following cardiac medications are used for, how they affect the body, and side effects that may occur when taking the medications.  Medications include Aspirin, Beta blockers, calcium channel blockers, ACE Inhibitors, angiotensin receptor blockers, diuretics, digoxin, and antihyperlipidemics.   Congestive Heart Failure -Discuss the definition of CHF, how to live with CHF, the signs and symptoms of CHF, and how keep track of weight and sodium intake.   Heart Disease and Intimacy -Discus the effect sexual activity has on the heart, how changes occur during intimacy as we age, and safety during sexual activity.   Smoking Cessation / COPD -Discuss different  methods to quit smoking, the health benefits of quitting smoking, and the definition of COPD.   Nutrition I: Fats -Discuss the types of cholesterol, what cholesterol does to the heart, and how cholesterol levels can be controlled.   Nutrition II:  Labels -Discuss the different components of food labels and how to read food label   Heart Parts and Heart Disease -Discuss the anatomy of the heart, the pathway of blood circulation through the heart, and these are affected by heart disease.   Stress I: Signs and Symptoms -Discuss the causes of stress, how stress may lead to anxiety and depression, and ways to limit stress.   Stress II: Relaxation -Discuss different types of relaxation techniques to limit stress.   Warning Signs of Stroke / TIA -Discuss definition of a stroke, what the signs and symptoms are of a stroke, and how to identify when someone is having stroke.   Knowledge Questionnaire Score: Knowledge Questionnaire Score - 12/06/17 1441      Knowledge Questionnaire Score   Pre Score  24/24       Core Components/Risk Factors/Patient Goals at Admission: Personal Goals and Risk Factors at Admission - 12/06/17 1446      Core Components/Risk Factors/Patient Goals on Admission    Weight Management  Yes    Intervention  Weight Management/Obesity: Establish reasonable short term and long term weight goals.    Admit Weight  206 lb (93.4 kg)    Goal Weight: Short Term  201 lb (91.2 kg)    Goal Weight: Long Term  196 lb (88.9 kg)    Expected Outcomes  Short Term: Continue to assess and modify interventions until short term weight is achieved;Long Term: Adherence to nutrition and physical activity/exercise program aimed toward attainment of established weight goal    Personal Goal Other  Yes    Personal Goal  Lose weight, be able to do things outdoors.    Intervention  Attend CR 2 x week and supplement exercise at home 3 x week.     Expected Outcomes  Achieve personal goals       Core Components/Risk Factors/Patient Goals Review:    Core Components/Risk Factors/Patient Goals at Discharge (Final Review):    ITP Comments: ITP Comments    Row Name 12/06/17 1443           ITP Comments  Mrs. Sanagustin is a pleasant  female that is ready to start CR. She has a fear of falling because she has fallen in the past and broken bones. She is still taking antibiotics for an infection in her drain tube site.           Comments: Patient arrived for 1st visit/orientation/education at 1230. Patient was referred to CR by Dr. Percival Spanish due to AVR (Z95.3). During orientation advised patient on arrival and appointment times what to wear, what to do before, during and after exercise. Reviewed attendance and class policy. Talked about inclement weather and class consultation policy. Pt is scheduled to return Cardiac Rehab on 12/11/17 at 1545. Pt was advised to come to class 15 minutes before class starts. Patient was also given instructions on meeting with the dietician and attending the Family Structure classes. Discussed RPE/Dpysnea scales. Discussed initial THR and how to find their radial and/or carotid pulse. Discussed the initial exercise prescription and how this effects their progress. Pt is eager to get started. Patient participated in warm up stretches followed by light weights and resistance bands. Patient was able to complete 6 minute  walk test. Patient did not c/o pain. Patient was measured for the equipment. Discussed equipment safety with patient. Took patient pre-anthropometric measurements. Patient finished visit at 1415.

## 2017-12-06 NOTE — Progress Notes (Signed)
Cardiac/Pulmonary Rehab Medication Review by a Pharmacist  Does the patient  feel that his/her medications are working for him/her?  yes  Has the patient been experiencing any side effects to the medications prescribed?  no  Does the patient measure his/her own blood pressure or blood glucose at home?  yes   Does the patient have any problems obtaining medications due to transportation or finances?   no  Understanding of regimen: excellent Understanding of indications: excellent Potential of compliance: excellent  Questions asked to Determine Patient Understanding of Medication Regimen:  1. What is the name of the medication?  2. What is the medication used for?  3. When should it be taken?  4. How much should be taken?  5. How will you take it?  6. What side effects should you report?  Understanding Defined as: Excellent: All questions above are correct Good: Questions 1-4 are correct Fair: Questions 1-2 are correct  Poor: 1 or none of the above questions are correct   Pharmacist comments: Patient seems to have a great understanding of her regimens and very compliant. Stated no side effects and feels like medications are working the way they should.   Margot Ables PharmD 12/06/2017 1353

## 2017-12-11 ENCOUNTER — Encounter (HOSPITAL_COMMUNITY)
Admission: RE | Admit: 2017-12-11 | Discharge: 2017-12-11 | Disposition: A | Discharge status: Home / Self Care | Payer: Medicare Other | Source: Ambulatory Visit | Attending: Cardiology | Admitting: Cardiology

## 2017-12-11 DIAGNOSIS — Z48812 Encounter for surgical aftercare following surgery on the circulatory system: Secondary | ICD-10-CM | POA: Diagnosis not present

## 2017-12-11 DIAGNOSIS — Z953 Presence of xenogenic heart valve: Secondary | ICD-10-CM

## 2017-12-11 NOTE — Progress Notes (Signed)
Daily Session Note  Patient Details  Name: Toni Cooper MRN: [id number] Date of Birth: 03-27-1946 Referring Provider:     CARDIAC REHAB PHASE II ORIENTATION from 12/06/2017 in [place]  Referring Provider  Dr. Percival Spanish      Encounter Date: 12/11/2017  Check In: Session Check In - 12/11/17 1538      Check-In   Location  [facility]    Staff Present  Diane Angelina Pih, MS, EP, [facility], Exercise Physiologist;Gregory Luther Parody, BS, EP, Exercise Physiologist;Destanee Bedonie Wynetta Emery, RN, BSN    Supervising physician immediately available to respond to emergencies  See telemetry face sheet for immediately available MD    Medication changes reported      No    Fall or balance concerns reported     Yes    Comments  Patient has fallen in the past so she is afraid of falling.     Warm-up and Cool-down  Performed as group-led Higher education careers adviser Performed  Yes    VAD Patient?  No      Pain Assessment   Currently in Pain?  No/denies    Multiple Pain Sites  No       Capillary Blood Glucose: No results found for this or any previous visit (from the past 24 hour(s)).    Social History   Tobacco Use  Smoking Status Never Smoker  Smokeless Tobacco Never Used    Goals Met:  Independence with exercise equipment Exercise tolerated well No report of cardiac concerns or symptoms Strength training completed today  Goals Unmet:  Not Applicable  Comments: Check out 1645.   Dr. Kate Sable is Medical Director for [facility] Cardiac and Pulmonary Rehab.

## 2017-12-12 ENCOUNTER — Telehealth: Payer: Self-pay | Admitting: Nurse Practitioner

## 2017-12-12 MED ORDER — FLUCONAZOLE 150 MG PO TABS: 150.0000 mg | ORAL_TABLET | Freq: Once | ORAL | 0 refills | Status: AC

## 2017-12-12 NOTE — Progress Notes (Signed)
Patient recieved the take home exercise plan today 12/11/2017. THR was addressed as were safety guidelines for being active when not in CR. Patient demonstrated an understanding and was encouraged to ask any future questions as they arise.  

## 2017-12-12 NOTE — Telephone Encounter (Signed)
Diflucan sent to pharmacy.

## 2017-12-12 NOTE — Telephone Encounter (Signed)
Patient aware rx sent to pharmacy.  

## 2017-12-12 NOTE — Telephone Encounter (Signed)
What symptoms do you have? Itching and burning  How long have you been sick? Yesterday  Have you been seen for this problem? Had a infection from surgery and now has a yeast infection  If your provider decides to give you a prescription, which pharmacy would you like for it to be sent to? [facility] in [place]   Patient informed that this information will be sent to the clinical staff for review and that they should receive a follow up call.

## 2017-12-13 ENCOUNTER — Encounter (HOSPITAL_COMMUNITY)
Admission: RE | Admit: 2017-12-13 | Discharge: 2017-12-13 | Disposition: A | Discharge status: Home / Self Care | Payer: Medicare Other | Source: Ambulatory Visit | Attending: Cardiology | Admitting: Cardiology

## 2017-12-13 DIAGNOSIS — Z48812 Encounter for surgical aftercare following surgery on the circulatory system: Secondary | ICD-10-CM | POA: Diagnosis not present

## 2017-12-13 DIAGNOSIS — Z953 Presence of xenogenic heart valve: Secondary | ICD-10-CM

## 2017-12-13 NOTE — Progress Notes (Signed)
Daily Session Note  Patient Details  Name: Toni Cooper MRN: [id number] Date of Birth: [birth year] Referring Provider:     [place] from 12/06/2017 in [place]  Referring Provider  Dr. Percival Spanish      Encounter Date: 12/13/2017  Check In: Session Check In - 12/13/17 1545      Check-In   Location  [facility]    Staff Present  Diane Angelina Pih, MS, EP, [facility], Exercise Physiologist;Gregory Luther Parody, BS, EP, Exercise Physiologist;Meggin Ola Wynetta Emery, RN, BSN    Supervising physician immediately available to respond to emergencies  See telemetry face sheet for immediately available MD    Medication changes reported      No    Fall or balance concerns reported     Yes    Comments  Patient has fallen in the past so she is afraid of falling.     Warm-up and Cool-down  Performed as group-led Higher education careers adviser Performed  Yes    VAD Patient?  No      Pain Assessment   Currently in Pain?  No/denies    Multiple Pain Sites  No       Capillary Blood Glucose: No results found for this or any previous visit (from the past 24 hour(s)).    Social History   Tobacco Use  Smoking Status Never Smoker  Smokeless Tobacco Never Used    Goals Met:  Independence with exercise equipment Exercise tolerated well No report of cardiac concerns or symptoms Strength training completed today  Goals Unmet:  Not Applicable  Comments: Check out 1645.   Dr. Kate Sable is Medical Director for [facility] Cardiac and Pulmonary Rehab.

## 2017-12-16 ENCOUNTER — Encounter (HOSPITAL_COMMUNITY)
Admission: RE | Admit: 2017-12-16 | Discharge: 2017-12-16 | Disposition: A | Discharge status: Home / Self Care | Payer: Medicare Other | Source: Ambulatory Visit | Attending: Cardiology | Admitting: Cardiology

## 2017-12-16 DIAGNOSIS — Z48812 Encounter for surgical aftercare following surgery on the circulatory system: Secondary | ICD-10-CM | POA: Diagnosis not present

## 2017-12-16 DIAGNOSIS — Z953 Presence of xenogenic heart valve: Secondary | ICD-10-CM

## 2017-12-16 NOTE — Progress Notes (Signed)
Daily Session Note  Patient Details  Name: ROSMERY DUGGIN MRN: [id number] Date of Birth: Jul 18, 1946 Referring Provider:     CARDIAC REHAB PHASE II ORIENTATION from 12/06/2017 in [place]  Referring Provider  Dr. Percival Spanish      Encounter Date: 12/16/2017  Check In: Session Check In - 12/16/17 1545      Check-In   Location  [facility]    Staff Present  Diane Angelina Pih, MS, EP, [facility], Exercise Physiologist;Gregory Luther Parody, BS, EP, Exercise Physiologist;Teyton Pattillo Wynetta Emery, RN, BSN    Supervising physician immediately available to respond to emergencies  See telemetry face sheet for immediately available MD    Medication changes reported      No    Fall or balance concerns reported     Yes    Comments  Patient has fallen in the past so she is afraid of falling.     Warm-up and Cool-down  Performed as group-led Higher education careers adviser Performed  Yes    VAD Patient?  No      Pain Assessment   Currently in Pain?  No/denies    Multiple Pain Sites  No       Capillary Blood Glucose: No results found for this or any previous visit (from the past 24 hour(s)).    Social History   Tobacco Use  Smoking Status Never Smoker  Smokeless Tobacco Never Used    Goals Met:  Independence with exercise equipment Exercise tolerated well No report of cardiac concerns or symptoms Strength training completed today  Goals Unmet:  Not Applicable  Comments: Check out 1645.   Dr. Kate Sable is Medical Director for [facility] Cardiac and Pulmonary Rehab.

## 2017-12-18 ENCOUNTER — Encounter (HOSPITAL_COMMUNITY)
Admission: RE | Admit: 2017-12-18 | Discharge: 2017-12-18 | Disposition: A | Discharge status: Home / Self Care | Payer: Medicare Other | Source: Ambulatory Visit | Attending: Cardiology | Admitting: Cardiology

## 2017-12-18 DIAGNOSIS — Z48812 Encounter for surgical aftercare following surgery on the circulatory system: Secondary | ICD-10-CM | POA: Diagnosis not present

## 2017-12-18 DIAGNOSIS — Z953 Presence of xenogenic heart valve: Secondary | ICD-10-CM

## 2017-12-18 NOTE — Progress Notes (Signed)
Daily Session Note  Patient Details  Name: Toni Cooper MRN: [id number] Date of Birth: 13-Mar-1946 Referring Provider:     CARDIAC REHAB PHASE II ORIENTATION from 12/06/2017 in [place]  Referring Provider  Dr. Percival Spanish      Encounter Date: 12/18/2017  Check In: Session Check In - 12/18/17 1545      Check-In   Location  [facility]    Staff Present  Diane Angelina Pih, MS, EP, [facility], Exercise Physiologist;Gregory Luther Parody, BS, EP, Exercise Physiologist;Elijan Googe Wynetta Emery, RN, BSN    Supervising physician immediately available to respond to emergencies  See telemetry face sheet for immediately available MD    Medication changes reported      No    Fall or balance concerns reported     Yes    Comments  Patient has fallen in the past so she is afraid of falling.     Warm-up and Cool-down  Performed as group-led Higher education careers adviser Performed  Yes    VAD Patient?  No      Pain Assessment   Currently in Pain?  No/denies    Multiple Pain Sites  No       Capillary Blood Glucose: No results found for this or any previous visit (from the past 24 hour(s)).    Social History   Tobacco Use  Smoking Status Never Smoker  Smokeless Tobacco Never Used    Goals Met:  Independence with exercise equipment Exercise tolerated well No report of cardiac concerns or symptoms Strength training completed today  Goals Unmet:  Not Applicable  Comments: Check out 1645.   Dr. Kate Sable is Medical Director for [facility] Cardiac and Pulmonary Rehab.

## 2017-12-20 ENCOUNTER — Encounter (HOSPITAL_COMMUNITY)
Admission: RE | Admit: 2017-12-20 | Discharge: 2017-12-20 | Disposition: A | Discharge status: Home / Self Care | Payer: Medicare Other | Source: Ambulatory Visit | Attending: Cardiology | Admitting: Cardiology

## 2017-12-20 DIAGNOSIS — Z48812 Encounter for surgical aftercare following surgery on the circulatory system: Secondary | ICD-10-CM | POA: Diagnosis not present

## 2017-12-20 DIAGNOSIS — Z953 Presence of xenogenic heart valve: Secondary | ICD-10-CM | POA: Diagnosis not present

## 2017-12-20 NOTE — Progress Notes (Signed)
Daily Session Note  Patient Details  Name: Toni Cooper MRN: [id number] Date of Birth: 06-Oct-1946 Referring Provider:     CARDIAC REHAB PHASE II ORIENTATION from 12/06/2017 in [place]  Referring Provider  Dr. Percival Spanish      Encounter Date: 12/20/2017  Check In: Session Check In - 12/20/17 1545      Check-In   Location  [facility]    Staff Present  Diane Angelina Pih, MS, EP, [facility], Exercise Physiologist;Gregory Luther Parody, BS, EP, Exercise Physiologist;Kelley Polinsky Wynetta Emery, RN, BSN    Medication changes reported      No    Fall or balance concerns reported     Yes    Comments  Patient has fallen in the past so she is afraid of falling.     Warm-up and Cool-down  Performed as group-led Higher education careers adviser Performed  Yes    VAD Patient?  No      Pain Assessment   Currently in Pain?  No/denies    Multiple Pain Sites  No       Capillary Blood Glucose: No results found for this or any previous visit (from the past 24 hour(s)).    Social History   Tobacco Use  Smoking Status Never Smoker  Smokeless Tobacco Never Used    Goals Met:  Independence with exercise equipment Personal goals reviewed No report of cardiac concerns or symptoms Strength training completed today  Goals Unmet:  Not Applicable  Comments: Check out 1645.   Dr. Kate Sable is Medical Director for [facility] Cardiac and Pulmonary Rehab.

## 2017-12-23 ENCOUNTER — Encounter (HOSPITAL_COMMUNITY)
Admission: RE | Admit: 2017-12-23 | Discharge: 2017-12-23 | Disposition: A | Discharge status: Home / Self Care | Payer: Medicare Other | Source: Ambulatory Visit | Attending: Cardiology | Admitting: Cardiology

## 2017-12-23 DIAGNOSIS — Z48812 Encounter for surgical aftercare following surgery on the circulatory system: Secondary | ICD-10-CM | POA: Diagnosis not present

## 2017-12-23 DIAGNOSIS — Z953 Presence of xenogenic heart valve: Secondary | ICD-10-CM

## 2017-12-23 NOTE — Progress Notes (Signed)
Daily Session Note  Patient Details  Name: Toni Cooper MRN: [id number] Date of Birth: 08/18/1946 Referring Provider:     CARDIAC REHAB PHASE II ORIENTATION from 12/06/2017 in [place]  Referring Provider  Dr. Percival Spanish      Encounter Date: 12/23/2017  Check In: Session Check In - 12/23/17 1545      Check-In   Location  [facility]    Staff Present  Aundra Dubin, RN, BSN;Diane Coad, MS, EP, [facility], Exercise Physiologist    Supervising physician immediately available to respond to emergencies  See telemetry face sheet for immediately available MD    Medication changes reported      No    Fall or balance concerns reported     Yes    Comments  Patient has fallen in the past so she is afraid of falling.     Warm-up and Cool-down  Performed as group-led Higher education careers adviser Performed  Yes    VAD Patient?  No      Pain Assessment   Currently in Pain?  No/denies    Multiple Pain Sites  No       Capillary Blood Glucose: No results found for this or any previous visit (from the past 24 hour(s)).    Social History   Tobacco Use  Smoking Status Never Smoker  Smokeless Tobacco Never Used    Goals Met:  Independence with exercise equipment Exercise tolerated well No report of cardiac concerns or symptoms Strength training completed today  Goals Unmet:  Not Applicable  Comments: Check out 1645.   Dr. Kate Sable is Medical Director for [facility] Cardiac and Pulmonary Rehab.

## 2017-12-25 ENCOUNTER — Encounter (HOSPITAL_COMMUNITY)
Admission: RE | Admit: 2017-12-25 | Discharge: 2017-12-25 | Disposition: A | Discharge status: Home / Self Care | Payer: Medicare Other | Source: Ambulatory Visit | Attending: Cardiology | Admitting: Cardiology

## 2017-12-25 DIAGNOSIS — Z953 Presence of xenogenic heart valve: Secondary | ICD-10-CM

## 2017-12-25 DIAGNOSIS — Z48812 Encounter for surgical aftercare following surgery on the circulatory system: Secondary | ICD-10-CM | POA: Diagnosis not present

## 2017-12-25 NOTE — Progress Notes (Signed)
Daily Session Note  Patient Details  Name: Toni Cooper MRN: [id number] Date of Birth: 06/25/1946 Referring Provider:     CARDIAC REHAB PHASE II ORIENTATION from 12/06/2017 in [place]  Referring Provider  Dr. Percival Spanish      Encounter Date: 12/25/2017  Check In: Session Check In - 12/25/17 1545      Check-In   Location  [facility]    Staff Present  Aundra Dubin, RN, BSN;Diane Coad, MS, EP, [facility], Exercise Physiologist    Supervising physician immediately available to respond to emergencies  See telemetry face sheet for immediately available MD    Medication changes reported      No    Fall or balance concerns reported     Yes    Comments  Patient has fallen in the past so she is afraid of falling.     Warm-up and Cool-down  Performed as group-led Higher education careers adviser Performed  Yes    VAD Patient?  No      Pain Assessment   Currently in Pain?  No/denies    Multiple Pain Sites  No       Capillary Blood Glucose: No results found for this or any previous visit (from the past 24 hour(s)).    Social History   Tobacco Use  Smoking Status Never Smoker  Smokeless Tobacco Never Used    Goals Met:  Independence with exercise equipment Exercise tolerated well No report of cardiac concerns or symptoms Strength training completed today  Goals Unmet:  Not Applicable  Comments: Check out 1645.   Dr. Kate Sable is Medical Director for [facility] Cardiac and Pulmonary Rehab.

## 2017-12-27 ENCOUNTER — Encounter (HOSPITAL_COMMUNITY)
Admission: RE | Admit: 2017-12-27 | Discharge: 2017-12-27 | Disposition: A | Discharge status: Home / Self Care | Payer: Medicare Other | Source: Ambulatory Visit | Attending: Cardiology | Admitting: Cardiology

## 2017-12-27 DIAGNOSIS — Z953 Presence of xenogenic heart valve: Secondary | ICD-10-CM

## 2017-12-27 DIAGNOSIS — Z48812 Encounter for surgical aftercare following surgery on the circulatory system: Secondary | ICD-10-CM | POA: Diagnosis not present

## 2017-12-27 NOTE — Progress Notes (Signed)
Daily Session Note  Patient Details  Name: Toni Cooper MRN: [id number] Date of Birth: 09-Sep-1946 Referring Provider:     [place] from 12/06/2017 in [place]  Referring Provider  Dr. Percival Spanish      Encounter Date: 12/27/2017  Check In: Session Check In - 12/27/17 1545      Check-In   Location  [facility]    Staff Present  Aundra Dubin, RN, BSN;Gregory Luther Parody, BS, EP, Exercise Physiologist    Supervising physician immediately available to respond to emergencies  See telemetry face sheet for immediately available MD    Medication changes reported      No    Fall or balance concerns reported     Yes    Comments  Patient has fallen in the past so she is afraid of falling.     Warm-up and Cool-down  Performed as group-led Higher education careers adviser Performed  Yes    VAD Patient?  No      Pain Assessment   Currently in Pain?  No/denies    Multiple Pain Sites  No       Capillary Blood Glucose: No results found for this or any previous visit (from the past 24 hour(s)).    Social History   Tobacco Use  Smoking Status Never Smoker  Smokeless Tobacco Never Used    Goals Met:  Independence with exercise equipment Exercise tolerated well No report of cardiac concerns or symptoms Strength training completed today  Goals Unmet:  Not Applicable  Comments: Check out 1645.   Dr. Kate Sable is Medical Director for [facility] Cardiac and Pulmonary Rehab.

## 2017-12-30 ENCOUNTER — Encounter (HOSPITAL_COMMUNITY)
Admission: RE | Admit: 2017-12-30 | Discharge: 2017-12-30 | Disposition: A | Discharge status: Home / Self Care | Payer: Medicare Other | Source: Ambulatory Visit | Attending: Cardiology | Admitting: Cardiology

## 2017-12-30 DIAGNOSIS — Z953 Presence of xenogenic heart valve: Secondary | ICD-10-CM

## 2017-12-30 DIAGNOSIS — Z48812 Encounter for surgical aftercare following surgery on the circulatory system: Secondary | ICD-10-CM | POA: Diagnosis not present

## 2017-12-30 NOTE — Progress Notes (Signed)
Daily Session Note  Patient Details  Name: Toni Cooper MRN: [id number] Date of Birth: 06/09/1946 Referring Provider:     CARDIAC REHAB PHASE II ORIENTATION from 12/06/2017 in [place] CARDIAC REHABILITATION  Referring Provider  Dr. Hochrein      Encounter Date: 12/30/2017  Check In: Session Check In - 12/30/17 1545      Check-In   Location  [facility]    Staff Present  Debra Johnson, RN, BSN;Diane Coad, MS, EP, [facility], Exercise Physiologist    Supervising physician immediately available to respond to emergencies  See telemetry face sheet for immediately available MD    Medication changes reported      No    Fall or balance concerns reported     Yes    Comments  Patient has fallen in the past so she is afraid of falling.     Warm-up and Cool-down  Performed as group-led instruction    Resistance Training Performed  Yes    VAD Patient?  No      Pain Assessment   Currently in Pain?  No/denies    Multiple Pain Sites  No       Capillary Blood Glucose: No results found for this or any previous visit (from the past 24 hour(s)).    Social History   Tobacco Use  Smoking Status Never Smoker  Smokeless Tobacco Never Used    Goals Met:  Independence with exercise equipment Exercise tolerated well No report of cardiac concerns or symptoms Strength training completed today  Goals Unmet:  Not Applicable  Comments: Check out 1645.   Dr. Suresh Koneswaran is Medical Director for [place] Cardiac and Pulmonary Rehab. 

## 2018-01-01 ENCOUNTER — Encounter (HOSPITAL_COMMUNITY)
Admission: RE | Admit: 2018-01-01 | Discharge: 2018-01-01 | Disposition: A | Discharge status: Home / Self Care | Payer: Medicare Other | Source: Ambulatory Visit | Attending: Cardiology | Admitting: Cardiology

## 2018-01-01 DIAGNOSIS — Z953 Presence of xenogenic heart valve: Secondary | ICD-10-CM

## 2018-01-01 DIAGNOSIS — Z48812 Encounter for surgical aftercare following surgery on the circulatory system: Secondary | ICD-10-CM | POA: Diagnosis not present

## 2018-01-01 NOTE — Progress Notes (Signed)
Daily Session Note  Patient Details  Name: Toni Cooper MRN: [id number] Date of Birth: May 18, 1946 Referring Provider:     CARDIAC REHAB PHASE II ORIENTATION from 12/06/2017 in [place]  Referring Provider  Dr. Percival Spanish      Encounter Date: 01/01/2018  Check In: Session Check In - 01/01/18 1545      Check-In   Location  [facility]    Staff Present  Aundra Dubin, RN, BSN;Diane Coad, MS, EP, [facility], Exercise Physiologist    Supervising physician immediately available to respond to emergencies  See telemetry face sheet for immediately available MD    Medication changes reported      No    Fall or balance concerns reported     Yes    Comments  Patient has fallen in the past so she is afraid of falling.     Warm-up and Cool-down  Performed as group-led Higher education careers adviser Performed  Yes    VAD Patient?  No      Pain Assessment   Currently in Pain?  No/denies    Multiple Pain Sites  No       Capillary Blood Glucose: No results found for this or any previous visit (from the past 24 hour(s)).  Exercise Prescription Changes - 01/01/18 1400      Response to Exercise   Blood Pressure (Admit)  130/70    Blood Pressure (Exercise)  130/70    Blood Pressure (Exit)  142/62    Heart Rate (Admit)  76 bpm    Heart Rate (Exercise)  98 bpm    Heart Rate (Exit)  81 bpm    Rating of Perceived Exertion (Exercise)  13    Duration  Progress to 30 minutes of  aerobic without signs/symptoms of physical distress    Intensity  THRR New [phone]      Progression   Progression  Continue to progress workloads to maintain intensity without signs/symptoms of physical distress.      Resistance Training   Training Prescription  Yes    Weight  2    Reps  10-15      Treadmill   MPH  1.3    Grade  0    Minutes  15    METs  1.9      NuStep   Level  2    SPM  78    Minutes  20    METs  1.9      Home Exercise Plan   Plans to continue  exercise at  Home (comment)    Frequency  Add 2 additional days to program exercise sessions.    Initial Home Exercises Provided  12/11/17       Social History   Tobacco Use  Smoking Status Never Smoker  Smokeless Tobacco Never Used    Goals Met:  Independence with exercise equipment Exercise tolerated well No report of cardiac concerns or symptoms Strength training completed today  Goals Unmet:  Not Applicable  Comments: Check out 1645.   Dr. Kate Sable is Medical Director for [facility] Cardiac and Pulmonary Rehab.

## 2018-01-01 NOTE — Progress Notes (Signed)
Cardiac Individual Treatment Plan  Patient Details  Name: Toni Cooper MRN: [id number] Date of Birth: 27-Mar-1946 Referring Provider:     CARDIAC REHAB [address] from 12/06/2017 in [place]  Referring Provider  Dr. Percival Spanish      Initial Encounter Date:    CARDIAC REHAB PHASE II ORIENTATION from 12/06/2017 in [place]  Date  12/06/17  Referring Provider  Dr. Percival Spanish      Visit Diagnosis: S/P aortic valve replacement with bioprosthetic valve  Patient's Home Medications on Admission:  Current Outpatient Medications:  .  acetaminophen (TYLENOL) 500 MG tablet, Take 500 mg 2 (two) times daily as needed by mouth for moderate pain or headache. , Disp: , Rfl:  .  Ascorbic Acid (VITAMIN C) 1000 MG tablet, Take 1,000 mg daily by mouth., Disp: , Rfl:  .  aspirin EC 325 MG EC tablet, Take 1 tablet (325 mg total) by mouth daily., Disp: 30 tablet, Rfl: 0 .  atorvastatin (LIPITOR) 10 MG tablet, Take 1 tablet (10 mg total) by mouth daily at 6 PM., Disp: 30 tablet, Rfl: 1 .  beta carotene w/minerals (OCUVITE) tablet, Take 1 tablet by mouth daily., Disp: , Rfl:  .  calcium carbonate (TUMS - DOSED IN MG ELEMENTAL CALCIUM) 500 MG chewable tablet, Chew 1 tablet by mouth daily as needed for indigestion or heartburn., Disp: , Rfl:  .  calcium-vitamin D (OSCAL 500/200 D-3) 500-200 MG-UNIT tablet, Take 1 tablet by mouth 2 (two) times daily., Disp: , Rfl:  .  cephALEXin (KEFLEX) 500 MG capsule, Take 1 capsule (500 mg total) by mouth 4 (four) times daily. (Patient not taking: Reported on 12/06/2017), Disp: 40 capsule, Rfl: 0 .  cholecalciferol (VITAMIN D) 1000 UNITS tablet, Take 1,000 Units by mouth daily., Disp: , Rfl:  .  fluticasone (FLONASE) 50 MCG/ACT nasal spray, Place 1 spray into both nostrils daily as needed for allergies or rhinitis., Disp: , Rfl:  .  HUMALOG 100 UNIT/ML injection, Inject 6-20 Units into the skin 3 (three) times daily with meals.  Using sliding scale., Disp: , Rfl:  .  hydrochlorothiazide (HYDRODIURIL) 25 MG tablet, Take 1 tablet (25 mg total) daily by mouth., Disp: 90 tablet, Rfl: 1 .  LANTUS 100 UNIT/ML injection, Inject 35 Units into the skin at bedtime. , Disp: , Rfl:  .  lisinopril (PRINIVIL,ZESTRIL) 20 MG tablet, Take 1 tablet (20 mg total) by mouth daily., Disp: 90 tablet, Rfl: 3 .  Magnesium 250 MG TABS, Take 250 mg by mouth daily. , Disp: , Rfl:  .  metFORMIN (GLUCOPHAGE-XR) 500 MG 24 hr tablet, Take 1 tablet (500 mg total) 2 (two) times daily by mouth., Disp: 180 tablet, Rfl: 1 .  metoprolol tartrate (LOPRESSOR) 12.5 mg TABS tablet, Take 12.5 mg by mouth 2 (two) times daily., Disp: , Rfl:  .  Multiple Vitamins-Minerals (MULTI-BETIC DIABETES) TABS, Take 1 tablet daily by mouth., Disp: , Rfl:  .  oxyCODONE (OXY IR/ROXICODONE) 5 MG immediate release tablet, Take 1 tablet (5 mg total) by mouth every 4 (four) hours as needed for severe pain., Disp: 30 tablet, Rfl: 0 .  pyridOXINE (VITAMIN B-6) 100 MG tablet, Take 100 mg by mouth daily., Disp: , Rfl:  .  TRUETRACK TEST test strip, Test four times daily., Disp: , Rfl:   Past Medical History: Past Medical History:  Diagnosis Date  . Aortic stenosis, severe 2015  . Cataract   . Diabetes mellitus without complication ([place])   .  Dyspnea   . Hypertension   . Obesity   . Osteoporosis   . S/P minimally invasive aortic valve replacement with bioprosthetic valve 10/16/2017   23 mm [employer] rapid deployment bovine stented bioprosthetic tissue valve via partial upper sternotomy    Tobacco Use: Social History   Tobacco Use  Smoking Status Never Smoker  Smokeless Tobacco Never Used    Labs: Recent Review Scientist, physiological    Labs for ITP Cardiac and Pulmonary Rehab Latest Ref Rng & Units 10/16/2017 10/16/2017 10/16/2017 10/16/2017 10/17/2017   Cholestrol 100 - 199 mg/dL - - - - -   LDLCALC 0 - 99 mg/dL - - - - -   HDL >39 mg/dL - - - - -   Trlycerides 0  - 149 mg/dL - - - - -   Hemoglobin A1c 4.8 - 5.6 % - - - - -   PHART 7.350 - 7.450 7.334(L) 7.323(L) - 7.313(L) 7.360   PCO2ART 32.0 - 48.0 mmHg 43.1 44.9 - 44.4 44.6   HCO3 20.0 - 28.0 mmol/L 23.1 23.4 - 22.6 24.6   TCO2 22 - 32 mmol/L 24 25 24 24 24    ACIDBASEDEF 0.0 - 2.0 mmol/L 3.0(H) 3.0(H) - 4.0(H) 0.2   O2SAT % 98.0 96.0 - 94.0 94.4      Capillary Blood Glucose: Lab Results  Component Value Date   GLUCAP 223 (H) 10/21/2017   GLUCAP 175 (H) 10/21/2017   GLUCAP 208 (H) 10/20/2017   GLUCAP 149 (H) 10/20/2017   GLUCAP 223 (H) 10/20/2017     Exercise Target Goals:    Exercise Program Goal: Individual exercise prescription set using results from initial 6 min walk test and THRR while considering  patient's activity barriers and safety.   Exercise Prescription Goal: Starting with aerobic activity 30 plus minutes a day, 3 days per week for initial exercise prescription. Provide home exercise prescription and guidelines that participant acknowledges understanding prior to discharge.  Activity Barriers & Risk Stratification: Activity Barriers & Cardiac Risk Stratification - 12/06/17 1412      Activity Barriers & Cardiac Risk Stratification   Activity Barriers  History of Falls    Cardiac Risk Stratification  High       6 Minute Walk: 6 Minute Walk    Row Name 12/06/17 1418         6 Minute Walk   Phase  Initial     Distance  1000 feet     Distance % Change  0 %     Distance Feet Change  0 ft     Walk Time  6 minutes     # of Rest Breaks  0     MPH  1.89     METS  2.45     RPE  12     Perceived Dyspnea   9     VO2 Peak  6.81     Symptoms  No     Resting HR  68 bpm     Resting BP  120/60     Resting Oxygen Saturation   98 %     Exercise Oxygen Saturation  during 6 min walk  95 %     Max Ex. HR  102 bpm     Max Ex. BP  148/70     2 Minute Post BP  120/66        Oxygen Initial Assessment:   Oxygen Re-Evaluation:   Oxygen Discharge (Final Oxygen  Re-Evaluation):  Initial Exercise Prescription: Initial Exercise Prescription - 12/06/17 1400      Date of Initial Exercise RX and Referring Provider   Date  12/06/17    Referring Provider  Dr. Percival Spanish      Treadmill   MPH  1    Grade  0    Minutes  15    METs  1.7      NuStep   Level  2    SPM  54    Minutes  20    METs  1.6      Prescription Details   Frequency (times per week)  3    Duration  Progress to 30 minutes of continuous aerobic without signs/symptoms of physical distress      Intensity   THRR 40-80% of Max Heartrate  [id number]    Ratings of Perceived Exertion  11-13    Perceived Dyspnea  0-4      Progression   Progression  Continue progressive overload as per policy without signs/symptoms or physical distress.      Resistance Training   Training Prescription  Yes    Weight  1    Reps  10-15       Perform Capillary Blood Glucose checks as needed.  Exercise Prescription Changes:  Exercise Prescription Changes    Row Name 12/12/17 0700 12/17/17 0700 01/01/18 1400         Response to Exercise   Blood Pressure (Admit)  -  144/62  130/70     Blood Pressure (Exercise)  -  146/60  130/70     Blood Pressure (Exit)  -  142/62  142/62     Heart Rate (Admit)  -  69 bpm  76 bpm     Heart Rate (Exercise)  -  109 bpm  98 bpm     Heart Rate (Exit)  -  78 bpm  81 bpm     Rating of Perceived Exertion (Exercise)  -  13  13     Duration  -  Progress to 30 minutes of  aerobic without signs/symptoms of physical distress  Progress to 30 minutes of  aerobic without signs/symptoms of physical distress     Intensity  -  THRR New 101-116-132  THRR New 101-119-134       Progression   Progression  -  Continue to progress workloads to maintain intensity without signs/symptoms of physical distress.  Continue to progress workloads to maintain intensity without signs/symptoms of physical distress.       Resistance Training   Training Prescription  Yes  Yes  Yes      Weight  1  1  2      Reps  10-15  10-15  10-15       Treadmill   MPH  1  1.2  1.3     Grade  0  0  0     Minutes  15  15  15      METs  1.7  1.9  1.9       NuStep   Level  2  2  2      SPM  54  75  78     Minutes  20  20  20      METs  1.6  1.9  1.9       Home Exercise Plan   Plans to continue exercise at  Home (comment)  Home (comment)  Home (comment)     Frequency  Add 2  additional days to program exercise sessions.  Add 2 additional days to program exercise sessions.  Add 2 additional days to program exercise sessions.     Initial Home Exercises Provided  12/11/17  12/11/17  12/11/17        Exercise Comments:  Exercise Comments    Row Name 12/12/17 0746 12/17/17 0742 01/01/18 1426 01/01/18 1427     Exercise Comments  Patient recieved the take home exercise plan today 12/11/2017. THR was addressed as were safety guidelines for being active when not in CR. Patient demonstrated an understanding and was encouraged to ask any future questions as they arise.   Patient is doing well in CR. She has increased her speed on the treadmill aswell as her SPMs on the Nustep. She has only just started and more progressions will appear over time,.   -  Patient is doing well in CR and has maintained both her nustep level and her SPMs. Patient has increased her speed on the treadmill as well as her weight for the warm up resistance. Patient can not tell a differnece yet from the program however it has still only been a total of 10 sessions.        Exercise Goals and Review:  Exercise Goals    Row Name 12/06/17 1420             Exercise Goals   Increase Physical Activity  Yes       Intervention  Provide advice, education, support and counseling about physical activity/exercise needs.;Develop an individualized exercise prescription for aerobic and resistive training based on initial evaluation findings, risk stratification, comorbidities and participant's personal goals.       Expected Outcomes   Short Term: Attend rehab on a regular basis to increase amount of physical activity.       Increase Strength and Stamina  Yes       Intervention  Develop an individualized exercise prescription for aerobic and resistive training based on initial evaluation findings, risk stratification, comorbidities and participant's personal goals.;Provide advice, education, support and counseling about physical activity/exercise needs.       Expected Outcomes  Short Term: Increase workloads from initial exercise prescription for resistance, speed, and METs.       Able to understand and use rate of perceived exertion (RPE) scale  Yes       Intervention  Provide education and explanation on how to use RPE scale       Expected Outcomes  Short Term: Able to use RPE daily in rehab to express subjective intensity level;Long Term:  Able to use RPE to guide intensity level when exercising independently       Able to understand and use Dyspnea scale  Yes       Intervention  Provide education and explanation on how to use Dyspnea scale       Expected Outcomes  Short Term: Able to use Dyspnea scale daily in rehab to express subjective sense of shortness of breath during exertion;Long Term: Able to use Dyspnea scale to guide intensity level when exercising independently       Knowledge and understanding of Target Heart Rate Range (THRR)  Yes       Intervention  Provide education and explanation of THRR including how the numbers were predicted and where they are located for reference       Expected Outcomes  Short Term: Able to state/look up THRR       Able to check pulse independently  Yes       Intervention  Provide education and demonstration on how to check pulse in carotid and radial arteries.;Review the importance of being able to check your own pulse for safety during independent exercise       Expected Outcomes  Short Term: Able to explain why pulse checking is important during independent exercise;Long Term: Able to  check pulse independently and accurately       Understanding of Exercise Prescription  Yes       Intervention  Provide education, explanation, and written materials on patient's individual exercise prescription       Expected Outcomes  Short Term: Able to explain program exercise prescription;Long Term: Able to explain home exercise prescription to exercise independently          Exercise Goals Re-Evaluation : Exercise Goals Re-Evaluation    Row Name 01/01/18 1425             Exercise Goal Re-Evaluation   Exercise Goals Review  Increase Physical Activity;Increase Strength and Stamina;Knowledge and understanding of Target Heart Rate Range (THRR)       Comments  Patient is doing well in CR and has maintained both her nustep level and her SPMs. Patient has increased her speed on the treadmill as well as her weight for the warm up resistance. Patient can not tell a differnece yet from the program however it has still only been a total of 10 sessions.        Expected Outcomes  Patient wishes to lose weight and to be able to domore activities out doors.            Discharge Exercise Prescription (Final Exercise Prescription Changes): Exercise Prescription Changes - 01/01/18 1400      Response to Exercise   Blood Pressure (Admit)  130/70    Blood Pressure (Exercise)  130/70    Blood Pressure (Exit)  142/62    Heart Rate (Admit)  76 bpm    Heart Rate (Exercise)  98 bpm    Heart Rate (Exit)  81 bpm    Rating of Perceived Exertion (Exercise)  13    Duration  Progress to 30 minutes of  aerobic without signs/symptoms of physical distress    Intensity  THRR New [phone]      Progression   Progression  Continue to progress workloads to maintain intensity without signs/symptoms of physical distress.      Resistance Training   Training Prescription  Yes    Weight  2    Reps  10-15      Treadmill   MPH  1.3    Grade  0    Minutes  15    METs  1.9      NuStep   Level  2    SPM   78    Minutes  20    METs  1.9      Home Exercise Plan   Plans to continue exercise at  Home (comment)    Frequency  Add 2 additional days to program exercise sessions.    Initial Home Exercises Provided  12/11/17       Nutrition:  Target Goals: Understanding of nutrition guidelines, daily intake of sodium 1500mg , cholesterol 200mg , calories 30% from fat and 7% or less from saturated fats, daily to have 5 or more servings of fruits and vegetables.  Biometrics: Pre Biometrics - 12/06/17 1420      Pre Biometrics   Height  5\' 4"  (1.626 m)  Weight  206 lb (93.4 kg)    Waist Circumference  47 inches    Hip Circumference  47.5 inches    Waist to Hip Ratio  0.99 %    BMI (Calculated)  35.34    Triceps Skinfold  22 mm    % Body Fat  46.4 %    Grip Strength  38.8 kg    Flexibility  0 in    Single Leg Stand  0 seconds        Nutrition Therapy Plan and Nutrition Goals: Nutrition Therapy & Goals - 01/01/18 1458      Nutrition Therapy   RD appointment deferred  Yes      Personal Nutrition Goals   Personal Goal #2  She eats heart healthy, low carb, low sugar.     Additional Goals?  No       Nutrition Assessments: Nutrition Assessments - 12/06/17 1445      MEDFICTS Scores   Pre Score  18       Nutrition Goals Re-Evaluation:   Nutrition Goals Discharge (Final Nutrition Goals Re-Evaluation):   Psychosocial: Target Goals: Acknowledge presence or absence of significant depression and/or stress, maximize coping skills, provide positive support system. Participant is able to verbalize types and ability to use techniques and skills needed for reducing stress and depression.  Initial Review & Psychosocial Screening: Initial Psych Review & Screening - 12/06/17 1448      Initial Review   Current issues with  None Identified      Family Dynamics   Good Support System?  Yes      Barriers   Psychosocial barriers to participate in program  There are no identifiable  barriers or psychosocial needs.      Screening Interventions   Interventions  Encouraged to exercise    Expected Outcomes  Short Term goal: Identification and review with participant of any Quality of Life or Depression concerns found by scoring the questionnaire.;Long Term goal: The participant improves quality of Life and PHQ9 Scores as seen by post scores and/or verbalization of changes       Quality of Life Scores: Quality of Life - 12/06/17 1421      Quality of Life Scores   Health/Function Pre  26.58 %    Socioeconomic Pre  29.58 %    Psych/Spiritual Pre  27.43 %    Family Pre  30 %    GLOBAL Pre  27.9 %      Scores of 19 and below usually indicate a poorer quality of life in these areas.  A difference of  2-3 points is a clinically meaningful difference.  A difference of 2-3 points in the total score of the Quality of Life Index has been associated with significant improvement in overall quality of life, self-image, physical symptoms, and general health in studies assessing change in quality of life.  PHQ-9: Recent Review Flowsheet Data    Depression screen [facility] 2/9 12/06/2017 11/20/2017 10/07/2017 04/22/2017 04/04/2017   Decreased Interest 0 0 0 0 0   Down, Depressed, Hopeless 0 0 0 0 0   PHQ - 2 Score 0 0 0 0 0   Altered sleeping 0 - - - -   Tired, decreased energy 0 - - - -   Change in appetite 0 - - - -   Feeling bad or failure about yourself  0 - - - -   Trouble concentrating 0 - - - -   Moving slowly or fidgety/restless 0 - - - -  Suicidal thoughts 0 - - - -   PHQ-9 Score 0 - - - -     Interpretation of Total Score  Total Score Depression Severity:  1-4 = Minimal depression, 5-9 = Mild depression, 10-14 = Moderate depression, 15-19 = Moderately severe depression, 20-27 = Severe depression   Psychosocial Evaluation and Intervention: Psychosocial Evaluation - 12/06/17 1448      Psychosocial Evaluation & Interventions   Interventions  Encouraged to exercise with the  program and follow exercise prescription    Continue Psychosocial Services   No Follow up required       Psychosocial Re-Evaluation: Psychosocial Re-Evaluation    [place] Name 01/01/18 1501             Psychosocial Re-Evaluation   Current issues with  None Identified       Comments  Patient's initial QOL score was 27.9 and her PHQ-9 score was 0. No psychosocial barriers identified.        Expected Outcomes  Patient will have no psychosocial barriers identified at discharge.        Interventions  Stress management education;Encouraged to attend Cardiac Rehabilitation for the exercise;Relaxation education       Continue Psychosocial Services   No Follow up required          Psychosocial Discharge (Final Psychosocial Re-Evaluation): Psychosocial Re-Evaluation - 01/01/18 1501      Psychosocial Re-Evaluation   Current issues with  None Identified    Comments  Patient's initial QOL score was 27.9 and her PHQ-9 score was 0. No psychosocial barriers identified.     Expected Outcomes  Patient will have no psychosocial barriers identified at discharge.     Interventions  Stress management education;Encouraged to attend Cardiac Rehabilitation for the exercise;Relaxation education    Continue Psychosocial Services   No Follow up required       Vocational Rehabilitation: Provide vocational rehab assistance to qualifying candidates.   Vocational Rehab Evaluation & Intervention:   Education: Education Goals: Education classes will be provided on a weekly basis, covering required topics. Participant will state understanding/return demonstration of topics presented.  Learning Barriers/Preferences: Learning Barriers/Preferences - 12/06/17 1441      Learning Barriers/Preferences   Learning Barriers  None    Learning Preferences  Written Material;Pictoral;Video       Education Topics: Hypertension, Hypertension Reduction -Define heart disease and high blood pressure. Discus how high  blood pressure affects the body and ways to reduce high blood pressure.   CARDIAC REHAB PHASE II EXERCISE from 12/25/2017 in [place]  Date  12/25/17  Educator  DC  Instruction Review Code  2- Demonstrated Understanding      Exercise and Your Heart -Discuss why it is important to exercise, the FITT principles of exercise, normal and abnormal responses to exercise, and how to exercise safely.   Angina -Discuss definition of angina, causes of angina, treatment of angina, and how to decrease risk of having angina.   Cardiac Medications -Review what the following cardiac medications are used for, how they affect the body, and side effects that may occur when taking the medications.  Medications include Aspirin, Beta blockers, calcium channel blockers, ACE Inhibitors, angiotensin receptor blockers, diuretics, digoxin, and antihyperlipidemics.   Congestive Heart Failure -Discuss the definition of CHF, how to live with CHF, the signs and symptoms of CHF, and how keep track of weight and sodium intake.   Heart Disease and Intimacy -Discus the effect sexual activity has on the heart, how  changes occur during intimacy as we age, and safety during sexual activity.   Smoking Cessation / COPD -Discuss different methods to quit smoking, the health benefits of quitting smoking, and the definition of COPD.   Nutrition I: Fats -Discuss the types of cholesterol, what cholesterol does to the heart, and how cholesterol levels can be controlled.   Nutrition II: Labels -Discuss the different components of food labels and how to read food label   Heart Parts/Heart Disease and PAD -Discuss the anatomy of the heart, the pathway of blood circulation through the heart, and these are affected by heart disease.   Stress I: Signs and Symptoms -Discuss the causes of stress, how stress may lead to anxiety and depression, and ways to limit stress.   Stress II:  Relaxation -Discuss different types of relaxation techniques to limit stress.   CARDIAC REHAB PHASE II EXERCISE from 12/25/2017 in [place]  Date  12/11/17  Educator  DC  Instruction Review Code  2- Demonstrated Understanding      Warning Signs of Stroke / TIA -Discuss definition of a stroke, what the signs and symptoms are of a stroke, and how to identify when someone is having stroke.   CARDIAC REHAB PHASE II EXERCISE from 12/25/2017 in [place]  Date  12/19/17  Educator  DC  Instruction Review Code  2- Demonstrated Understanding      Knowledge Questionnaire Score: Knowledge Questionnaire Score - 12/06/17 1441      Knowledge Questionnaire Score   Pre Score  24/24       Core Components/Risk Factors/Patient Goals at Admission: Personal Goals and Risk Factors at Admission - 12/06/17 1446      Core Components/Risk Factors/Patient Goals on Admission    Weight Management  Yes    Intervention  Weight Management/Obesity: Establish reasonable short term and long term weight goals.    Admit Weight  206 lb (93.4 kg)    Goal Weight: Short Term  201 lb (91.2 kg)    Goal Weight: Long Term  196 lb (88.9 kg)    Expected Outcomes  Short Term: Continue to assess and modify interventions until short term weight is achieved;Long Term: Adherence to nutrition and physical activity/exercise program aimed toward attainment of established weight goal    Personal Goal Other  Yes    Personal Goal  Lose weight, be able to do things outdoors.    Intervention  Attend CR 2 x week and supplement exercise at home 3 x week.     Expected Outcomes  Achieve personal goals       Core Components/Risk Factors/Patient Goals Review:  Goals and Risk Factor Review    Row Name 01/01/18 1458             Core Components/Risk Factors/Patient Goals Review   Personal Goals Review  Weight Management/Obesity Lose weight 10-15 lbs. Be able to do things outdoors.         Review  Patient has completed 10 sessions maintaining her weight. She says she does not think the program has made a difference yet in how she feels. She has not progressed on the treadmill due to fear it may cause hip pain. Will continue to monitor for progress.        Expected Outcomes  Patient will continue to attend sessions and complete the program meeting her personal goals.           Core Components/Risk Factors/Patient Goals at Discharge (Final Review):  Goals  and Risk Factor Review - 01/01/18 1458      Core Components/Risk Factors/Patient Goals Review   Personal Goals Review  Weight Management/Obesity Lose weight 10-15 lbs. Be able to do things outdoors.     Review  Patient has completed 10 sessions maintaining her weight. She says she does not think the program has made a difference yet in how she feels. She has not progressed on the treadmill due to fear it may cause hip pain. Will continue to monitor for progress.     Expected Outcomes  Patient will continue to attend sessions and complete the program meeting her personal goals.        ITP Comments: ITP Comments    Row Name 12/06/17 1443           ITP Comments  Mrs. Vanderloop is a pleasant female that is ready to start CR. She has a fear of falling because she has fallen in the past and broken bones. She is still taking antibiotics for an infection in her drain tube site.           Comments: ITP 30 Day REVIEW Patient doing well in the program. Will continue to monitor for progress.

## 2018-01-03 ENCOUNTER — Encounter (HOSPITAL_COMMUNITY)
Admission: RE | Admit: 2018-01-03 | Discharge: 2018-01-03 | Disposition: A | Discharge status: Home / Self Care | Payer: Medicare Other | Source: Ambulatory Visit | Attending: Cardiology | Admitting: Cardiology

## 2018-01-03 DIAGNOSIS — Z48812 Encounter for surgical aftercare following surgery on the circulatory system: Secondary | ICD-10-CM | POA: Diagnosis not present

## 2018-01-03 DIAGNOSIS — Z953 Presence of xenogenic heart valve: Secondary | ICD-10-CM

## 2018-01-03 NOTE — Progress Notes (Signed)
Daily Session Note  Patient Details  Name: Toni Cooper MRN: [id number] Date of Birth: 09/22/1946 Referring Provider:     CARDIAC REHAB PHASE II ORIENTATION from 12/06/2017 in [place] [facility]  Referring Provider  Dr. Hochrein      Encounter Date: 01/03/2018  Check In: Session Check In - 01/03/18 1545      Check-In   Location  [facility]    Staff Present  Debra Johnson, RN, BSN;Diane Coad, MS, EP, [facility], Exercise Physiologist    Supervising physician immediately available to respond to emergencies  See telemetry face sheet for immediately available MD    Medication changes reported      No    Fall or balance concerns reported     Yes    Comments  Patient has fallen in the past so she is afraid of falling.     Warm-up and Cool-down  Performed as group-led instruction    Resistance Training Performed  Yes    VAD Patient?  No      Pain Assessment   Currently in Pain?  No/denies    Multiple Pain Sites  No       Capillary Blood Glucose: No results found for this or any previous visit (from the past 24 hour(s)).    Social History   Tobacco Use  Smoking Status Never Smoker  Smokeless Tobacco Never Used    Goals Met:  Independence with exercise equipment Exercise tolerated well No report of cardiac concerns or symptoms Strength training completed today  Goals Unmet:  Not Applicable  Comments: Check out 1645.   Dr. Suresh Koneswaran is Medical Director for [place] [facility]. 

## 2018-01-06 ENCOUNTER — Encounter (HOSPITAL_COMMUNITY)
Admission: RE | Admit: 2018-01-06 | Discharge: 2018-01-06 | Disposition: A | Discharge status: Home / Self Care | Payer: Medicare Other | Source: Ambulatory Visit | Attending: Cardiology | Admitting: Cardiology

## 2018-01-06 ENCOUNTER — Encounter: Payer: Self-pay | Admitting: Thoracic Surgery (Cardiothoracic Vascular Surgery)

## 2018-01-06 DIAGNOSIS — Z953 Presence of xenogenic heart valve: Secondary | ICD-10-CM

## 2018-01-06 DIAGNOSIS — Z48812 Encounter for surgical aftercare following surgery on the circulatory system: Secondary | ICD-10-CM | POA: Diagnosis not present

## 2018-01-06 NOTE — Progress Notes (Signed)
Daily Session Note  Patient Details  Name: Toni Cooper MRN: [id number] Date of Birth: 1946/02/24 Referring Provider:     CARDIAC REHAB PHASE II ORIENTATION from 12/06/2017 in [place]  Referring Provider  Dr. Percival Spanish      Encounter Date: 01/06/2018  Check In: Session Check In - 01/06/18 1545      Check-In   Location  [facility]    Staff Present  Aundra Dubin, RN, BSN;Diane Coad, MS, EP, [facility], Exercise Physiologist    Supervising physician immediately available to respond to emergencies  See telemetry face sheet for immediately available MD    Medication changes reported      No    Fall or balance concerns reported     Yes    Comments  Patient has fallen in the past so she is afraid of falling.     Warm-up and Cool-down  Performed as group-led Higher education careers adviser Performed  Yes    VAD Patient?  No      Pain Assessment   Currently in Pain?  No/denies    Multiple Pain Sites  No       Capillary Blood Glucose: No results found for this or any previous visit (from the past 24 hour(s)).    Social History   Tobacco Use  Smoking Status Never Smoker  Smokeless Tobacco Never Used    Goals Met:  Independence with exercise equipment Exercise tolerated well No report of cardiac concerns or symptoms Strength training completed today  Goals Unmet:  Not Applicable  Comments: Check out 1645.   Dr. Kate Sable is Medical Director for [facility] Cardiac and Pulmonary Rehab.

## 2018-01-08 ENCOUNTER — Encounter (HOSPITAL_COMMUNITY)
Admission: RE | Admit: 2018-01-08 | Discharge: 2018-01-08 | Disposition: A | Discharge status: Home / Self Care | Payer: Medicare Other | Source: Ambulatory Visit | Attending: Cardiology | Admitting: Cardiology

## 2018-01-08 DIAGNOSIS — Z953 Presence of xenogenic heart valve: Secondary | ICD-10-CM

## 2018-01-08 DIAGNOSIS — Z48812 Encounter for surgical aftercare following surgery on the circulatory system: Secondary | ICD-10-CM | POA: Diagnosis not present

## 2018-01-08 NOTE — Progress Notes (Signed)
Daily Session Note  Patient Details  Name: Toni Cooper MRN: [id number] Date of Birth: 06-Mar-1946 Referring Provider:     CARDIAC REHAB PHASE II ORIENTATION from 12/06/2017 in [place]  Referring Provider  Dr. Percival Spanish      Encounter Date: 01/08/2018  Check In: Session Check In - 01/08/18 1523      Check-In   Location  [facility]    Staff Present  Aundra Dubin, RN, BSN;Diane Coad, MS, EP, [facility], Exercise Physiologist    Supervising physician immediately available to respond to emergencies  See telemetry face sheet for immediately available MD    Medication changes reported      No    Fall or balance concerns reported     Yes    Comments  Patient has fallen in the past so she is afraid of falling.     Warm-up and Cool-down  Performed as group-led Higher education careers adviser Performed  Yes    VAD Patient?  No      Pain Assessment   Currently in Pain?  No/denies    Multiple Pain Sites  No       Capillary Blood Glucose: No results found for this or any previous visit (from the past 24 hour(s)).    Social History   Tobacco Use  Smoking Status Never Smoker  Smokeless Tobacco Never Used    Goals Met:  Independence with exercise equipment Exercise tolerated well No report of cardiac concerns or symptoms Strength training completed today  Goals Unmet:  Not Applicable  Comments: Check out 1645.   Dr. Kate Sable is Medical Director for [facility] Cardiac and Pulmonary Rehab.

## 2018-01-09 ENCOUNTER — Other Ambulatory Visit: Payer: Self-pay | Admitting: Thoracic Surgery (Cardiothoracic Vascular Surgery)

## 2018-01-09 DIAGNOSIS — I350 Nonrheumatic aortic (valve) stenosis: Secondary | ICD-10-CM

## 2018-01-10 ENCOUNTER — Encounter (HOSPITAL_COMMUNITY)
Admission: RE | Admit: 2018-01-10 | Discharge: 2018-01-10 | Disposition: A | Discharge status: Home / Self Care | Payer: Medicare Other | Source: Ambulatory Visit | Attending: Cardiology | Admitting: Cardiology

## 2018-01-10 DIAGNOSIS — Z953 Presence of xenogenic heart valve: Secondary | ICD-10-CM

## 2018-01-10 DIAGNOSIS — Z48812 Encounter for surgical aftercare following surgery on the circulatory system: Secondary | ICD-10-CM | POA: Diagnosis not present

## 2018-01-10 NOTE — Progress Notes (Signed)
Daily Session Note  Patient Details  Name: Toni Cooper MRN: [id number] Date of Birth: [birth year] Referring Provider:     CARDIAC REHAB PHASE II ORIENTATION from 12/06/2017 in [place]  Referring Provider  Dr. Percival Spanish      Encounter Date: 01/10/2018  Check In: Session Check In - 01/10/18 1545      Check-In   Location  [facility]    Staff Present  Aundra Dubin, RN, BSN;Diane Coad, MS, EP, [facility], Exercise Physiologist    Supervising physician immediately available to respond to emergencies  See telemetry face sheet for immediately available MD    Medication changes reported      No    Fall or balance concerns reported     Yes    Comments  Patient has fallen in the past so she is afraid of falling.     Warm-up and Cool-down  Performed as group-led Higher education careers adviser Performed  Yes    VAD Patient?  No      Pain Assessment   Currently in Pain?  No/denies    Multiple Pain Sites  No       Capillary Blood Glucose: No results found for this or any previous visit (from the past 24 hour(s)).    Social History   Tobacco Use  Smoking Status Never Smoker  Smokeless Tobacco Never Used    Goals Met:  Independence with exercise equipment Exercise tolerated well No report of cardiac concerns or symptoms Strength training completed today  Goals Unmet:  Not Applicable  Comments: Check out 1645.   Dr. Kate Sable is Medical Director for [facility] Cardiac and Pulmonary Rehab.

## 2018-01-13 ENCOUNTER — Ambulatory Visit (INDEPENDENT_AMBULATORY_CARE_PROVIDER_SITE_OTHER): Payer: Self-pay | Admitting: Thoracic Surgery (Cardiothoracic Vascular Surgery)

## 2018-01-13 ENCOUNTER — Ambulatory Visit
Admission: RE | Admit: 2018-01-13 | Discharge: 2018-01-13 | Disposition: A | Discharge status: Home / Self Care | Payer: Medicare Other | Source: Ambulatory Visit | Attending: Thoracic Surgery (Cardiothoracic Vascular Surgery) | Admitting: Thoracic Surgery (Cardiothoracic Vascular Surgery)

## 2018-01-13 ENCOUNTER — Encounter: Payer: Self-pay | Admitting: Thoracic Surgery (Cardiothoracic Vascular Surgery)

## 2018-01-13 ENCOUNTER — Encounter (HOSPITAL_COMMUNITY): Payer: Medicare Other

## 2018-01-13 VITALS — BP 150/70 | HR 72 | Resp 20 | Ht 64.0 in | Wt 204.0 lb

## 2018-01-13 DIAGNOSIS — T8189XD Other complications of procedures, not elsewhere classified, subsequent encounter: Secondary | ICD-10-CM

## 2018-01-13 DIAGNOSIS — Z952 Presence of prosthetic heart valve: Secondary | ICD-10-CM

## 2018-01-13 DIAGNOSIS — Z953 Presence of xenogenic heart valve: Secondary | ICD-10-CM

## 2018-01-13 DIAGNOSIS — I350 Nonrheumatic aortic (valve) stenosis: Secondary | ICD-10-CM

## 2018-01-13 MED ORDER — ASPIRIN 81 MG PO TBEC: 81.0000 mg | DELAYED_RELEASE_TABLET | Freq: Every day | ORAL | Status: AC

## 2018-01-13 NOTE — Progress Notes (Signed)
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  Her postoperative recovery in the hospital was uneventful and she was discharged home on the fifth postoperative day.  Early following hospital discharge she developed some mild cellulitis and poor healing related to 1 of her chest tube incisions located in the crease beneath her right breast.  This ultimately resolved uneventfully with local wound care.  She was last seen here in our office on November 13, 2017 and she returns to our office today for routine follow-up.  She has not yet been seen in follow-up by Dr. Percival Spanish but she is scheduled to see him next month with a routine follow-up echocardiogram.  She has been actively participating in the cardiac rehab program at [facility].  She reports that overall she is doing quite well.  The soreness in her chest has almost completely resolved.  She states that her breathing is notably better than it was prior to surgery.  Overall she is pleased with her progress.   Current Outpatient Medications  Medication Sig Dispense Refill  . acetaminophen (TYLENOL) 500 MG tablet Take 500 mg 2 (two) times daily as needed by mouth for moderate pain or headache.     . Ascorbic Acid (VITAMIN C) 1000 MG tablet Take 1,000 mg daily by mouth.    [name] aspirin EC 325 MG EC tablet Take 1 tablet (325 mg total) by mouth daily. 30 tablet 0  . atorvastatin (LIPITOR) 10 MG  tablet Take 1 tablet (10 mg total) by mouth daily at 6 PM. 30 tablet 1  . beta carotene w/minerals (OCUVITE) tablet Take 1 tablet by mouth daily.    . calcium carbonate (TUMS - DOSED IN MG ELEMENTAL CALCIUM) 500 MG chewable tablet Chew 1 tablet by mouth daily as needed for indigestion or heartburn.    . calcium-vitamin D (OSCAL 500/200 D-3) 500-200 MG-UNIT tablet Take 1 tablet by mouth 2 (two) times daily.    . cholecalciferol (VITAMIN D) 1000 UNITS tablet Take 1,000 Units by mouth daily.    . fluticasone (FLONASE) 50 MCG/ACT nasal spray Place 1 spray into both nostrils daily as needed for allergies or rhinitis.    [name] HUMALOG 100 UNIT/ML injection Inject 6-20 Units into the skin 3 (three) times daily with meals. Using sliding scale.    . hydrochlorothiazide (HYDRODIURIL) 25 MG tablet Take 1 tablet (25 mg total) daily by mouth. 90 tablet 1  . LANTUS 100 UNIT/ML injection Inject 35 Units into the skin at bedtime.     [name] lisinopril (PRINIVIL,ZESTRIL) 20 MG tablet Take 1 tablet (20 mg total) by mouth daily. 90 tablet 3  . Magnesium 250 MG TABS Take 250 mg by mouth daily.     . metFORMIN (GLUCOPHAGE-XR) 500 MG 24 hr tablet Take 1 tablet (500 mg total) 2 (two) times daily by mouth. 180 tablet 1  . metoprolol tartrate (LOPRESSOR) 12.5 mg TABS  tablet Take 12.5 mg by mouth 2 (two) times daily.    . Multiple Vitamins-Minerals (MULTI-BETIC DIABETES) TABS Take 1 tablet daily by mouth.    . oxyCODONE (OXY IR/ROXICODONE) 5 MG immediate release tablet Take 1 tablet (5 mg total) by mouth every 4 (four) hours as needed for severe pain. 30 tablet 0  . pyridOXINE (VITAMIN B-6) 100 MG tablet Take 100 mg by mouth daily.    [name] TEST test strip Test four times daily.     No current facility-administered medications for this visit.       Physical Exam:   BP (!) 150/70   Pulse 72   Resp 20   Ht 5\' 4"  (1.626 m)   Wt 204 lb (92.5 kg)   SpO2 97% Comment: RA  BMI 35.02 kg/m    General:  Well-appearing  Chest:   Clear to auscultation  CV:   Regular rate and rhythm without murmur  Incisions:  Completely healed  Abdomen:  Soft nontender  Extremities:  Warm and well perfused  Diagnostic Tests:  CHEST  2 VIEW  COMPARISON:  11/04/2017  FINDINGS: The heart size and mediastinal contours are within normal limits. Stable appearance prosthetic aortic valve. Stable hiatal hernia. There is no evidence of pulmonary edema, consolidation, pneumothorax, nodule or pleural fluid. The visualized skeletal structures are unremarkable.  IMPRESSION: Stable appearance of prosthetic aortic valve and hiatal hernia. No acute findings.   Electronically Signed   By: Aletta Edouard M.D.   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  Plan:  I have instructed the patient that she may decrease her dose of aspirin to 81 mg daily.  We have not made any changes to her other medications.  I have encouraged the patient to continue to increase her physical activity as tolerated without any particular limitations.  The patient has been reminded regarding the importance of dental hygiene and the lifelong need for antibiotic prophylaxis for all dental cleanings and other related invasive procedures.  She will return to our office for routine follow-up next November, approximately 1 year following her surgery.  She will call and return sooner should specific problems or questions arise.     [name]. [name], MD 01/13/2018 4:15 PM

## 2018-01-13 NOTE — Patient Instructions (Addendum)
Decrease aspirin to 81 mg/day.  Continue all other previous medications without any changes at this time  Endocarditis is a potentially serious infection of heart valves or inside lining of the heart.  It occurs more commonly in patients with diseased heart valves (such as patient's with aortic or mitral valve disease) and in patients who have undergone heart valve repair or replacement.  Certain surgical and dental procedures may put you at risk, such as dental cleaning, other dental procedures, or any surgery involving the respiratory, urinary, gastrointestinal tract, gallbladder or prostate gland.   To minimize your chances for develooping endocarditis, maintain good oral health and seek prompt medical attention for any infections involving the mouth, teeth, gums, skin or urinary tract.    Always notify your doctor or dentist about your underlying heart valve condition before having any invasive procedures. You will need to take antibiotics before certain procedures, including all routine dental cleanings or other dental procedures.  Your cardiologist or dentist should prescribe these antibiotics for you to be taken ahead of time.

## 2018-01-15 ENCOUNTER — Encounter (HOSPITAL_COMMUNITY)
Admission: RE | Admit: 2018-01-15 | Discharge: 2018-01-15 | Disposition: A | Discharge status: Home / Self Care | Payer: Medicare Other | Source: Ambulatory Visit | Attending: Cardiology | Admitting: Cardiology

## 2018-01-15 DIAGNOSIS — Z48812 Encounter for surgical aftercare following surgery on the circulatory system: Secondary | ICD-10-CM | POA: Diagnosis not present

## 2018-01-15 DIAGNOSIS — Z953 Presence of xenogenic heart valve: Secondary | ICD-10-CM

## 2018-01-15 NOTE — Progress Notes (Signed)
Daily Session Note  Patient Details  Name: Toni Cooper MRN: [id number] Date of Birth: [birth year] Referring Provider:     CARDIAC REHAB PHASE II ORIENTATION from 12/06/2017 in [place]  Referring Provider  Dr. Percival Spanish      Encounter Date: 01/15/2018  Check In: Session Check In - 01/15/18 1510      Check-In   Location  [facility]    Staff Present  Aundra Dubin, RN, BSN;Diane Coad, MS, EP, [facility], Exercise Physiologist    Supervising physician immediately available to respond to emergencies  See telemetry face sheet for immediately available MD    Medication changes reported      No    Fall or balance concerns reported     Yes    Comments  Patient has fallen in the past so she is afraid of falling.     Warm-up and Cool-down  Performed as group-led Higher education careers adviser Performed  Yes    VAD Patient?  No      Pain Assessment   Currently in Pain?  No/denies    Multiple Pain Sites  No       Capillary Blood Glucose: No results found for this or any previous visit (from the past 24 hour(s)).    Social History   Tobacco Use  Smoking Status Never Smoker  Smokeless Tobacco Never Used    Goals Met:  Independence with exercise equipment Exercise tolerated well No report of cardiac concerns or symptoms Strength training completed today  Goals Unmet:  Not Applicable  Comments: Check out 1645.   Dr. Kate Sable is Medical Director for [facility] Cardiac and Pulmonary Rehab.

## 2018-01-17 ENCOUNTER — Encounter (HOSPITAL_COMMUNITY)
Admission: RE | Admit: 2018-01-17 | Discharge: 2018-01-17 | Disposition: A | Discharge status: Home / Self Care | Payer: Medicare Other | Source: Ambulatory Visit | Attending: Cardiology | Admitting: Cardiology

## 2018-01-17 DIAGNOSIS — Z48812 Encounter for surgical aftercare following surgery on the circulatory system: Secondary | ICD-10-CM | POA: Insufficient documentation

## 2018-01-17 DIAGNOSIS — Z953 Presence of xenogenic heart valve: Secondary | ICD-10-CM

## 2018-01-17 NOTE — Progress Notes (Signed)
Daily Session Note  Patient Details  Name: Toni Cooper MRN: [id number] Date of Birth: 1946/11/09 Referring Provider:     CARDIAC REHAB PHASE II ORIENTATION from 12/06/2017 in [place]  Referring Provider  Dr. Percival Spanish      Encounter Date: 01/17/2018  Check In: Session Check In - 01/17/18 1545      Check-In   Location  [facility]    Staff Present  Aundra Dubin, RN, BSN;Diane Coad, MS, EP, [facility], Exercise Physiologist    Supervising physician immediately available to respond to emergencies  See telemetry face sheet for immediately available MD    Medication changes reported      No    Fall or balance concerns reported     Yes    Comments  Patient has fallen in the past so she is afraid of falling.     Warm-up and Cool-down  Performed as group-led Higher education careers adviser Performed  Yes    VAD Patient?  No      Pain Assessment   Currently in Pain?  No/denies    Multiple Pain Sites  No       Capillary Blood Glucose: No results found for this or any previous visit (from the past 24 hour(s)).    Social History   Tobacco Use  Smoking Status Never Smoker  Smokeless Tobacco Never Used    Goals Met:  Independence with exercise equipment Exercise tolerated well No report of cardiac concerns or symptoms Strength training completed today  Goals Unmet:  Not Applicable  Comments: Check out 1645.   Dr. Kate Sable is Medical Director for [facility] Cardiac and Pulmonary Rehab.

## 2018-01-20 ENCOUNTER — Encounter (HOSPITAL_COMMUNITY)
Admission: RE | Admit: 2018-01-20 | Discharge: 2018-01-20 | Disposition: A | Discharge status: Home / Self Care | Payer: Medicare Other | Source: Ambulatory Visit | Attending: Cardiology | Admitting: Cardiology

## 2018-01-20 DIAGNOSIS — Z953 Presence of xenogenic heart valve: Secondary | ICD-10-CM

## 2018-01-20 DIAGNOSIS — Z48812 Encounter for surgical aftercare following surgery on the circulatory system: Secondary | ICD-10-CM | POA: Diagnosis not present

## 2018-01-20 NOTE — Progress Notes (Signed)
Cardiac Individual Treatment Plan  Patient Details  Name: Toni Cooper MRN: [id number] Date of Birth: 1946-07-17 Referring Provider:     CARDIAC REHAB [employer] from 12/06/2017 in [place]  Referring Provider  Dr. Percival Spanish      Initial Encounter Date:    CARDIAC REHAB PHASE II ORIENTATION from 12/06/2017 in [place]  Date  12/06/17  Referring Provider  Dr. Percival Spanish      Visit Diagnosis: S/P aortic valve replacement with bioprosthetic valve  Patient's Home Medications on Admission:  Current Outpatient Medications:  .  acetaminophen (TYLENOL) 500 MG tablet, Take 500 mg 2 (two) times daily as needed by mouth for moderate pain or headache. , Disp: , Rfl:  .  Ascorbic Acid (VITAMIN C) 1000 MG tablet, Take 1,000 mg daily by mouth., Disp: , Rfl:  .  aspirin 81 MG EC tablet, Take 1 tablet (81 mg total) by mouth daily., Disp: , Rfl:  .  atorvastatin (LIPITOR) 10 MG tablet, Take 1 tablet (10 mg total) by mouth daily at 6 PM., Disp: 30 tablet, Rfl: 1 .  beta carotene w/minerals (OCUVITE) tablet, Take 1 tablet by mouth daily., Disp: , Rfl:  .  calcium carbonate (TUMS - DOSED IN MG ELEMENTAL CALCIUM) 500 MG chewable tablet, Chew 1 tablet by mouth daily as needed for indigestion or heartburn., Disp: , Rfl:  .  calcium-vitamin D (OSCAL 500/200 D-3) 500-200 MG-UNIT tablet, Take 1 tablet by mouth 2 (two) times daily., Disp: , Rfl:  .  cholecalciferol (VITAMIN D) 1000 UNITS tablet, Take 1,000 Units by mouth daily., Disp: , Rfl:  .  fluticasone (FLONASE) 50 MCG/ACT nasal spray, Place 1 spray into both nostrils daily as needed for allergies or rhinitis., Disp: , Rfl:  .  HUMALOG 100 UNIT/ML injection, Inject 6-20 Units into the skin 3 (three) times daily with meals. Using sliding scale., Disp: , Rfl:  .  hydrochlorothiazide (HYDRODIURIL) 25 MG tablet, Take 1 tablet (25 mg total) daily by mouth., Disp: 90 tablet, Rfl: 1 .  LANTUS 100 UNIT/ML  injection, Inject 35 Units into the skin at bedtime. , Disp: , Rfl:  .  lisinopril (PRINIVIL,ZESTRIL) 20 MG tablet, Take 1 tablet (20 mg total) by mouth daily., Disp: 90 tablet, Rfl: 3 .  Magnesium 250 MG TABS, Take 250 mg by mouth daily. , Disp: , Rfl:  .  metFORMIN (GLUCOPHAGE-XR) 500 MG 24 hr tablet, Take 1 tablet (500 mg total) 2 (two) times daily by mouth., Disp: 180 tablet, Rfl: 1 .  metoprolol tartrate (LOPRESSOR) 12.5 mg TABS tablet, Take 12.5 mg by mouth 2 (two) times daily., Disp: , Rfl:  .  Multiple Vitamins-Minerals (MULTI-BETIC DIABETES) TABS, Take 1 tablet daily by mouth., Disp: , Rfl:  .  oxyCODONE (OXY IR/ROXICODONE) 5 MG immediate release tablet, Take 1 tablet (5 mg total) by mouth every 4 (four) hours as needed for severe pain., Disp: 30 tablet, Rfl: 0 .  pyridOXINE (VITAMIN B-6) 100 MG tablet, Take 100 mg by mouth daily., Disp: , Rfl:  .  TRUETRACK TEST test strip, Test four times daily., Disp: , Rfl:   Past Medical History: Past Medical History:  Diagnosis Date  . Aortic stenosis, severe 2015  . Cataract   . Diabetes mellitus without complication ([place])   . Dyspnea   . Hypertension   . Obesity   . Osteoporosis   . S/P minimally invasive aortic valve replacement with bioprosthetic valve 10/16/2017   23 mm [employer]  Elite rapid deployment bovine stented bioprosthetic tissue valve via partial upper sternotomy    Tobacco Use: Social History   Tobacco Use  Smoking Status Never Smoker  Smokeless Tobacco Never Used    Labs: Recent Review Flowsheet Data    Labs for ITP Cardiac and Pulmonary Rehab Latest Ref Rng & Units 10/16/2017 10/16/2017 10/16/2017 10/16/2017 10/17/2017   Cholestrol 100 - 199 mg/dL - - - - -   LDLCALC 0 - 99 mg/dL - - - - -   HDL >39 mg/dL - - - - -   Trlycerides 0 - 149 mg/dL - - - - -   Hemoglobin A1c 4.8 - 5.6 % - - - - -   PHART 7.350 - 7.450 7.334(L) 7.323(L) - 7.313(L) 7.360   PCO2ART 32.0 - 48.0 mmHg 43.1 44.9 - 44.4 44.6   HCO3  20.0 - 28.0 mmol/L 23.1 23.4 - 22.6 24.6   TCO2 22 - 32 mmol/L 24 25 24 24 24    ACIDBASEDEF 0.0 - 2.0 mmol/L 3.0(H) 3.0(H) - 4.0(H) 0.2   O2SAT % 98.0 96.0 - 94.0 94.4      Capillary Blood Glucose: Lab Results  Component Value Date   GLUCAP 223 (H) 10/21/2017   GLUCAP 175 (H) 10/21/2017   GLUCAP 208 (H) 10/20/2017   GLUCAP 149 (H) 10/20/2017   GLUCAP 223 (H) 10/20/2017     Exercise Target Goals:    Exercise Program Goal: Individual exercise prescription set using results from initial 6 min walk test and THRR while considering  patient's activity barriers and safety.   Exercise Prescription Goal: Starting with aerobic activity 30 plus minutes a day, 3 days per week for initial exercise prescription. Provide home exercise prescription and guidelines that participant acknowledges understanding prior to discharge.  Activity Barriers & Risk Stratification: Activity Barriers & Cardiac Risk Stratification - 12/06/17 1412      Activity Barriers & Cardiac Risk Stratification   Activity Barriers  History of Falls    Cardiac Risk Stratification  High       6 Minute Walk: 6 Minute Walk    Row Name 12/06/17 1418         6 Minute Walk   Phase  Initial     Distance  1000 feet     Distance % Change  0 %     Distance Feet Change  0 ft     Walk Time  6 minutes     # of Rest Breaks  0     MPH  1.89     METS  2.45     RPE  12     Perceived Dyspnea   9     VO2 Peak  6.81     Symptoms  No     Resting HR  68 bpm     Resting BP  120/60     Resting Oxygen Saturation   98 %     Exercise Oxygen Saturation  during 6 min walk  95 %     Max Ex. HR  102 bpm     Max Ex. BP  148/70     2 Minute Post BP  120/66        Oxygen Initial Assessment:   Oxygen Re-Evaluation:   Oxygen Discharge (Final Oxygen Re-Evaluation):   Initial Exercise Prescription: Initial Exercise Prescription - 12/06/17 1400      Date of Initial Exercise RX and Referring Provider   Date  12/06/17     Referring Provider  Dr. Percival Spanish      Treadmill   MPH  1    Grade  0    Minutes  15    METs  1.7      NuStep   Level  2    SPM  54    Minutes  20    METs  1.6      Prescription Details   Frequency (times per week)  3    Duration  Progress to 30 minutes of continuous aerobic without signs/symptoms of physical distress      Intensity   THRR 40-80% of Max Heartrate  [id number]    Ratings of Perceived Exertion  11-13    Perceived Dyspnea  0-4      Progression   Progression  Continue progressive overload as per policy without signs/symptoms or physical distress.      Resistance Training   Training Prescription  Yes    Weight  1    Reps  10-15       Perform Capillary Blood Glucose checks as needed.  Exercise Prescription Changes:  Exercise Prescription Changes    Row Name 12/12/17 0700 12/17/17 0700 01/01/18 1400 01/16/18 1200       Response to Exercise   Blood Pressure (Admit)  -  144/62  130/70  130/60    Blood Pressure (Exercise)  -  146/60  130/70  142/60    Blood Pressure (Exit)  -  142/62  142/62  130/60    Heart Rate (Admit)  -  69 bpm  76 bpm  61 bpm    Heart Rate (Exercise)  -  109 bpm  98 bpm  97 bpm    Heart Rate (Exit)  -  78 bpm  81 bpm  77 bpm    Rating of Perceived Exertion (Exercise)  -  13  13  12     Duration  -  Progress to 30 minutes of  aerobic without signs/symptoms of physical distress  Progress to 30 minutes of  aerobic without signs/symptoms of physical distress  Progress to 30 minutes of  aerobic without signs/symptoms of physical distress    Intensity  -  THRR New 101-116-132  THRR New 101-119-134  THRR New [phone]      Progression   Progression  -  Continue to progress workloads to maintain intensity without signs/symptoms of physical distress.  Continue to progress workloads to maintain intensity without signs/symptoms of physical distress.  Continue to progress workloads to maintain intensity without signs/symptoms of physical distress.       Resistance Training   Training Prescription  Yes  Yes  Yes  Yes    Weight  1  1  2  2     Reps  10-15  10-15  10-15  10-15      Treadmill   MPH  1  1.2  1.3  1.4    Grade  0  0  0  0    Minutes  15  15  15  15     METs  1.7  1.9  1.9  2      NuStep   Level  2  2  2  2     SPM  54  75  78  83    Minutes  20  20  20  20     METs  1.6  1.9  1.9  1.9      Home Exercise Plan   Plans to continue exercise at  Home (  comment)  Home (comment)  Home (comment)  Home (comment)    Frequency  Add 2 additional days to program exercise sessions.  Add 2 additional days to program exercise sessions.  Add 2 additional days to program exercise sessions.  Add 2 additional days to program exercise sessions.    Initial Home Exercises Provided  12/11/17  12/11/17  12/11/17  12/11/17       Exercise Comments:  Exercise Comments    Row Name 12/12/17 0746 12/17/17 0742 01/01/18 1426 01/01/18 1427 01/16/18 1251   Exercise Comments  Patient recieved the take home exercise plan today 12/11/2017. THR was addressed as were safety guidelines for being active when not in CR. Patient demonstrated an understanding and was encouraged to ask any future questions as they arise.   Patient is doing well in CR. She has increased her speed on the treadmill aswell as her SPMs on the Nustep. She has only just started and more progressions will appear over time,.   -  Patient is doing well in CR and has maintained both her nustep level and her SPMs. Patient has increased her speed on the treadmill as well as her weight for the warm up resistance. Patient can not tell a differnece yet from the program however it has still only been a total of 10 sessions.   Patient continues to do well in CR and has increased her speed on the treadmill and has maintained her level and SPMs on the Nustep machine. Patient has also decreased her level on the RPE scale showing that she is feeling more confortable on the equipment that before. Patient will  continued to be progressed and monitored throughout the program.      Exercise Goals and Review:  Exercise Goals    Row Name 12/06/17 1420             Exercise Goals   Increase Physical Activity  Yes       Intervention  Provide advice, education, support and counseling about physical activity/exercise needs.;Develop an individualized exercise prescription for aerobic and resistive training based on initial evaluation findings, risk stratification, comorbidities and participant's personal goals.       Expected Outcomes  Short Term: Attend rehab on a regular basis to increase amount of physical activity.       Increase Strength and Stamina  Yes       Intervention  Develop an individualized exercise prescription for aerobic and resistive training based on initial evaluation findings, risk stratification, comorbidities and participant's personal goals.;Provide advice, education, support and counseling about physical activity/exercise needs.       Expected Outcomes  Short Term: Increase workloads from initial exercise prescription for resistance, speed, and METs.       Able to understand and use rate of perceived exertion (RPE) scale  Yes       Intervention  Provide education and explanation on how to use RPE scale       Expected Outcomes  Short Term: Able to use RPE daily in rehab to express subjective intensity level;Long Term:  Able to use RPE to guide intensity level when exercising independently       Able to understand and use Dyspnea scale  Yes       Intervention  Provide education and explanation on how to use Dyspnea scale       Expected Outcomes  Short Term: Able to use Dyspnea scale daily in rehab to express subjective sense of shortness of breath during exertion;Long  Term: Able to use Dyspnea scale to guide intensity level when exercising independently       Knowledge and understanding of Target Heart Rate Range (THRR)  Yes       Intervention  Provide education and explanation of  THRR including how the numbers were predicted and where they are located for reference       Expected Outcomes  Short Term: Able to state/look up THRR       Able to check pulse independently  Yes       Intervention  Provide education and demonstration on how to check pulse in carotid and radial arteries.;Review the importance of being able to check your own pulse for safety during independent exercise       Expected Outcomes  Short Term: Able to explain why pulse checking is important during independent exercise;Long Term: Able to check pulse independently and accurately       Understanding of Exercise Prescription  Yes       Intervention  Provide education, explanation, and written materials on patient's individual exercise prescription       Expected Outcomes  Short Term: Able to explain program exercise prescription;Long Term: Able to explain home exercise prescription to exercise independently          Exercise Goals Re-Evaluation : Exercise Goals Re-Evaluation    [place] Name 01/01/18 1425 01/16/18 1249           Exercise Goal Re-Evaluation   Exercise Goals Review  Increase Physical Activity;Increase Strength and Stamina;Knowledge and understanding of Target Heart Rate Range (THRR)  Increase Physical Activity;Increase Strength and Stamina;Knowledge and understanding of Target Heart Rate Range (THRR)      Comments  Patient is doing well in CR and has maintained both her nustep level and her SPMs. Patient has increased her speed on the treadmill as well as her weight for the warm up resistance. Patient can not tell a differnece yet from the program however it has still only been a total of 10 sessions.   Patient continues to do well in CR and has increased her speed on the treadmill and has maintained her level and SPMs on the Nustep machine. Patient has also decreased her level on the RPE scale showing that she is feeling more confortable on the equipment that before. Patient will continued to be  progressed and monitored throughout the program.       Expected Outcomes  Patient wishes to lose weight and to be able to domore activities out doors.   Patient wishes to lose weight and to be able to domore activities out doors.           Discharge Exercise Prescription (Final Exercise Prescription Changes): Exercise Prescription Changes - 01/16/18 1200      Response to Exercise   Blood Pressure (Admit)  130/60    Blood Pressure (Exercise)  142/60    Blood Pressure (Exit)  130/60    Heart Rate (Admit)  61 bpm    Heart Rate (Exercise)  97 bpm    Heart Rate (Exit)  77 bpm    Rating of Perceived Exertion (Exercise)  12    Duration  Progress to 30 minutes of  aerobic without signs/symptoms of physical distress    Intensity  THRR New [phone]      Progression   Progression  Continue to progress workloads to maintain intensity without signs/symptoms of physical distress.      Resistance Training   Training Prescription  Yes  Weight  2    Reps  10-15      Treadmill   MPH  1.4    Grade  0    Minutes  15    METs  2      NuStep   Level  2    SPM  83    Minutes  20    METs  1.9      Home Exercise Plan   Plans to continue exercise at  Home (comment)    Frequency  Add 2 additional days to program exercise sessions.    Initial Home Exercises Provided  12/11/17       Nutrition:  Target Goals: Understanding of nutrition guidelines, daily intake of sodium 1500mg , cholesterol 200mg , calories 30% from fat and 7% or less from saturated fats, daily to have 5 or more servings of fruits and vegetables.  Biometrics: Pre Biometrics - 12/06/17 1420      Pre Biometrics   Height  5\' 4"  (1.626 m)    Weight  206 lb (93.4 kg)    Waist Circumference  47 inches    Hip Circumference  47.5 inches    Waist to Hip Ratio  0.99 %    BMI (Calculated)  35.34    Triceps Skinfold  22 mm    % Body Fat  46.4 %    Grip Strength  38.8 kg    Flexibility  0 in    Single Leg Stand  0 seconds         Nutrition Therapy Plan and Nutrition Goals: Nutrition Therapy & Goals - 01/20/18 1352      Personal Nutrition Goals   Personal Goal #2  She eats heart healthy, low carb, low sugar.        Nutrition Assessments: Nutrition Assessments - 12/06/17 1445      MEDFICTS Scores   Pre Score  18       Nutrition Goals Re-Evaluation:   Nutrition Goals Discharge (Final Nutrition Goals Re-Evaluation):   Psychosocial: Target Goals: Acknowledge presence or absence of significant depression and/or stress, maximize coping skills, provide positive support system. Participant is able to verbalize types and ability to use techniques and skills needed for reducing stress and depression.  Initial Review & Psychosocial Screening: Initial Psych Review & Screening - 12/06/17 1448      Initial Review   Current issues with  None Identified      Family Dynamics   Good Support System?  Yes      Barriers   Psychosocial barriers to participate in program  There are no identifiable barriers or psychosocial needs.      Screening Interventions   Interventions  Encouraged to exercise    Expected Outcomes  Short Term goal: Identification and review with participant of any Quality of Life or Depression concerns found by scoring the questionnaire.;Long Term goal: The participant improves quality of Life and PHQ9 Scores as seen by post scores and/or verbalization of changes       Quality of Life Scores: Quality of Life - 12/06/17 1421      Quality of Life Scores   Health/Function Pre  26.58 %    Socioeconomic Pre  29.58 %    Psych/Spiritual Pre  27.43 %    Family Pre  30 %    GLOBAL Pre  27.9 %      Scores of 19 and below usually indicate a poorer quality of life in these areas.  A difference of  2-3  points is a clinically meaningful difference.  A difference of 2-3 points in the total score of the Quality of Life Index has been associated with significant improvement in overall quality of life,  self-image, physical symptoms, and general health in studies assessing change in quality of life.  PHQ-9: Recent Review Flowsheet Data    Depression screen [facility] 2/9 12/06/2017 11/20/2017 10/07/2017 04/22/2017 04/04/2017   Decreased Interest 0 0 0 0 0   Down, Depressed, Hopeless 0 0 0 0 0   PHQ - 2 Score 0 0 0 0 0   Altered sleeping 0 - - - -   Tired, decreased energy 0 - - - -   Change in appetite 0 - - - -   Feeling bad or failure about yourself  0 - - - -   Trouble concentrating 0 - - - -   Moving slowly or fidgety/restless 0 - - - -   Suicidal thoughts 0 - - - -   PHQ-9 Score 0 - - - -     Interpretation of Total Score  Total Score Depression Severity:  1-4 = Minimal depression, 5-9 = Mild depression, 10-14 = Moderate depression, 15-19 = Moderately severe depression, 20-27 = Severe depression   Psychosocial Evaluation and Intervention: Psychosocial Evaluation - 12/06/17 1448      Psychosocial Evaluation & Interventions   Interventions  Encouraged to exercise with the program and follow exercise prescription    Continue Psychosocial Services   No Follow up required       Psychosocial Re-Evaluation: Psychosocial Re-Evaluation    Row Name 01/01/18 1501 01/20/18 1355           Psychosocial Re-Evaluation   Current issues with  None Identified  None Identified      Comments  Patient's initial QOL score was 27.9 and her PHQ-9 score was 0. No psychosocial barriers identified.   Patient's initial QOL score was 27.9 and her PHQ-9 score was 0. No psychosocial barriers identified.       Expected Outcomes  Patient will have no psychosocial barriers identified at discharge.   Patient will have no psychosocial barriers identified at discharge.       Interventions  Stress management education;Encouraged to attend Cardiac Rehabilitation for the exercise;Relaxation education  Stress management education;Encouraged to attend Cardiac Rehabilitation for the exercise;Relaxation education       Continue Psychosocial Services   No Follow up required  No Follow up required         Psychosocial Discharge (Final Psychosocial Re-Evaluation): Psychosocial Re-Evaluation - 01/20/18 1355      Psychosocial Re-Evaluation   Current issues with  None Identified    Comments  Patient's initial QOL score was 27.9 and her PHQ-9 score was 0. No psychosocial barriers identified.     Expected Outcomes  Patient will have no psychosocial barriers identified at discharge.     Interventions  Stress management education;Encouraged to attend Cardiac Rehabilitation for the exercise;Relaxation education    Continue Psychosocial Services   No Follow up required       Vocational Rehabilitation: Provide vocational rehab assistance to qualifying candidates.   Vocational Rehab Evaluation & Intervention:   Education: Education Goals: Education classes will be provided on a weekly basis, covering required topics. Participant will state understanding/return demonstration of topics presented.  Learning Barriers/Preferences: Learning Barriers/Preferences - 12/06/17 1441      Learning Barriers/Preferences   Learning Barriers  None    Learning Preferences  Written Material;Pictoral;Video  Education Topics: Hypertension, Hypertension Reduction -Define heart disease and high blood pressure. Discus how high blood pressure affects the body and ways to reduce high blood pressure.   CARDIAC REHAB PHASE II EXERCISE from 01/15/2018 in [place]  Date  12/25/17  Educator  DC  Instruction Review Code  2- Demonstrated Understanding      Exercise and Your Heart -Discuss why it is important to exercise, the FITT principles of exercise, normal and abnormal responses to exercise, and how to exercise safely.   CARDIAC REHAB PHASE II EXERCISE from 01/15/2018 in [place]  Date  01/01/18  Educator  DC  Instruction Review Code  2- Demonstrated Understanding       Angina -Discuss definition of angina, causes of angina, treatment of angina, and how to decrease risk of having angina.   CARDIAC REHAB PHASE II EXERCISE from 01/15/2018 in [place]  Date  01/08/18  Educator  DC  Instruction Review Code  2- Demonstrated Understanding      Cardiac Medications -Review what the following cardiac medications are used for, how they affect the body, and side effects that may occur when taking the medications.  Medications include Aspirin, Beta blockers, calcium channel blockers, ACE Inhibitors, angiotensin receptor blockers, diuretics, digoxin, and antihyperlipidemics.   CARDIAC REHAB PHASE II EXERCISE from 01/15/2018 in [place]  Date  01/16/18  Educator  DC  Instruction Review Code  2- Demonstrated Understanding      Congestive Heart Failure -Discuss the definition of CHF, how to live with CHF, the signs and symptoms of CHF, and how keep track of weight and sodium intake.   Heart Disease and Intimacy -Discus the effect sexual activity has on the heart, how changes occur during intimacy as we age, and safety during sexual activity.   Smoking Cessation / COPD -Discuss different methods to quit smoking, the health benefits of quitting smoking, and the definition of COPD.   Nutrition I: Fats -Discuss the types of cholesterol, what cholesterol does to the heart, and how cholesterol levels can be controlled.   Nutrition II: Labels -Discuss the different components of food labels and how to read food label   Heart Parts/Heart Disease and PAD -Discuss the anatomy of the heart, the pathway of blood circulation through the heart, and these are affected by heart disease.   Stress I: Signs and Symptoms -Discuss the causes of stress, how stress may lead to anxiety and depression, and ways to limit stress.   Stress II: Relaxation -Discuss different types of relaxation techniques to limit stress.    CARDIAC REHAB PHASE II EXERCISE from 01/15/2018 in [place]  Date  12/11/17  Educator  DC  Instruction Review Code  2- Demonstrated Understanding      Warning Signs of Stroke / TIA -Discuss definition of a stroke, what the signs and symptoms are of a stroke, and how to identify when someone is having stroke.   CARDIAC REHAB PHASE II EXERCISE from 01/15/2018 in [place]  Date  12/19/17  Educator  DC  Instruction Review Code  2- Demonstrated Understanding      Knowledge Questionnaire Score: Knowledge Questionnaire Score - 12/06/17 1441      Knowledge Questionnaire Score   Pre Score  24/24       Core Components/Risk Factors/Patient Goals at Admission: Personal Goals and Risk Factors at Admission - 12/06/17 1446      Core Components/Risk Factors/Patient Goals on  Admission    Weight Management  Yes    Intervention  Weight Management/Obesity: Establish reasonable short term and long term weight goals.    Admit Weight  206 lb (93.4 kg)    Goal Weight: Short Term  201 lb (91.2 kg)    Goal Weight: Long Term  196 lb (88.9 kg)    Expected Outcomes  Short Term: Continue to assess and modify interventions until short term weight is achieved;Long Term: Adherence to nutrition and physical activity/exercise program aimed toward attainment of established weight goal    Personal Goal Other  Yes    Personal Goal  Lose weight, be able to do things outdoors.    Intervention  Attend CR 2 x week and supplement exercise at home 3 x week.     Expected Outcomes  Achieve personal goals       Core Components/Risk Factors/Patient Goals Review:  Goals and Risk Factor Review    Row Name 01/01/18 1458 01/20/18 1352           Core Components/Risk Factors/Patient Goals Review   Personal Goals Review  Weight Management/Obesity Lose weight 10-15 lbs. Be able to do things outdoors.   Weight Management/Obesity Lose weight 10-15 lbs; be able to do things  outdoors.       Review  Patient has completed 10 sessions maintaining her weight. She says she does not think the program has made a difference yet in how she feels. She has not progressed on the treadmill due to fear it may cause hip pain. Will continue to monitor for progress.   Patient has completed 17 sessions maintaining her weight since her last 30 day review. She continues to do well in the program with progression. She says she feels stronger and has more energy since she started the program. She is not able to do thing outdoors yet due to the weather. She has progressed on the treadmill some with no complaints of hip pain. Will continue to monitor for progress.       Expected Outcomes  Patient will continue to attend sessions and complete the program meeting her personal goals.   Patient will continue to attend sessions and complete the program meeting her personal goals.          Core Components/Risk Factors/Patient Goals at Discharge (Final Review):  Goals and Risk Factor Review - 01/20/18 1352      Core Components/Risk Factors/Patient Goals Review   Personal Goals Review  Weight Management/Obesity Lose weight 10-15 lbs; be able to do things outdoors.     Review  Patient has completed 17 sessions maintaining her weight since her last 30 day review. She continues to do well in the program with progression. She says she feels stronger and has more energy since she started the program. She is not able to do thing outdoors yet due to the weather. She has progressed on the treadmill some with no complaints of hip pain. Will continue to monitor for progress.     Expected Outcomes  Patient will continue to attend sessions and complete the program meeting her personal goals.        ITP Comments: ITP Comments    Row Name 12/06/17 1443           ITP Comments  Mrs. Knudsen is a pleasant female that is ready to start CR. She has a fear of falling because she has fallen in the past and broken  bones. She is still taking antibiotics for an  infection in her drain tube site.           Comments: ITP 30 Day REVIEW Patient doing well in the program. Will continue to monitor for progress.

## 2018-01-20 NOTE — Progress Notes (Signed)
Daily Session Note  Patient Details  Name: Toni Cooper MRN: [id number] Date of Birth: 04/02/1946 Referring Provider:     CARDIAC REHAB PHASE II ORIENTATION from 12/06/2017 in [place]  Referring Provider  Dr. Percival Spanish      Encounter Date: 01/20/2018  Check In: Session Check In - 01/20/18 1545      Check-In   Location  [facility]    Staff Present  Aundra Dubin, RN, BSN;Diane Coad, MS, EP, [facility], Exercise Physiologist    Supervising physician immediately available to respond to emergencies  See telemetry face sheet for immediately available MD    Medication changes reported      No    Fall or balance concerns reported     Yes    Comments  Patient has fallen in the past so she is afraid of falling.     Warm-up and Cool-down  Performed as group-led Higher education careers adviser Performed  Yes    VAD Patient?  No      Pain Assessment   Currently in Pain?  No/denies    Multiple Pain Sites  No       Capillary Blood Glucose: No results found for this or any previous visit (from the past 24 hour(s)).    Social History   Tobacco Use  Smoking Status Never Smoker  Smokeless Tobacco Never Used    Goals Met:  Independence with exercise equipment Exercise tolerated well No report of cardiac concerns or symptoms Strength training completed today  Goals Unmet:  Not Applicable  Comments: Check out 1645.   Dr. Kate Sable is Medical Director for [facility] Cardiac and Pulmonary Rehab.

## 2018-01-22 ENCOUNTER — Encounter (HOSPITAL_COMMUNITY)
Admission: RE | Admit: 2018-01-22 | Discharge: 2018-01-22 | Disposition: A | Discharge status: Home / Self Care | Payer: Medicare Other | Source: Ambulatory Visit | Attending: Cardiology | Admitting: Cardiology

## 2018-01-22 DIAGNOSIS — Z953 Presence of xenogenic heart valve: Secondary | ICD-10-CM

## 2018-01-22 DIAGNOSIS — Z48812 Encounter for surgical aftercare following surgery on the circulatory system: Secondary | ICD-10-CM | POA: Diagnosis not present

## 2018-01-22 NOTE — Progress Notes (Signed)
Daily Session Note  Patient Details  Name: Toni Cooper MRN: [id number] Date of Birth: 06-14-1946 Referring Provider:     CARDIAC REHAB PHASE II ORIENTATION from 12/06/2017 in [place]  Referring Provider  Dr. Percival Spanish      Encounter Date: 01/22/2018  Check In: Session Check In - 01/22/18 1513      Check-In   Location  [facility]    Staff Present  Aundra Dubin, RN, BSN;Diane Coad, MS, EP, [facility], Exercise Physiologist    Supervising physician immediately available to respond to emergencies  See telemetry face sheet for immediately available MD    Medication changes reported      No    Fall or balance concerns reported     Yes    Comments  Patient has fallen in the past so she is afraid of falling.     Warm-up and Cool-down  Performed as group-led Higher education careers adviser Performed  Yes    VAD Patient?  No      Pain Assessment   Currently in Pain?  No/denies    Multiple Pain Sites  No       Capillary Blood Glucose: No results found for this or any previous visit (from the past 24 hour(s)).    Social History   Tobacco Use  Smoking Status Never Smoker  Smokeless Tobacco Never Used    Goals Met:  Independence with exercise equipment Exercise tolerated well No report of cardiac concerns or symptoms Strength training completed today  Goals Unmet:  Not Applicable  Comments: Check out 1645.   Dr. Kate Sable is Medical Director for [facility] Cardiac and Pulmonary Rehab.

## 2018-01-24 ENCOUNTER — Encounter (HOSPITAL_COMMUNITY)
Admission: RE | Admit: 2018-01-24 | Discharge: 2018-01-24 | Disposition: A | Discharge status: Home / Self Care | Payer: Medicare Other | Source: Ambulatory Visit | Attending: Cardiology | Admitting: Cardiology

## 2018-01-24 DIAGNOSIS — Z48812 Encounter for surgical aftercare following surgery on the circulatory system: Secondary | ICD-10-CM | POA: Diagnosis not present

## 2018-01-24 DIAGNOSIS — Z953 Presence of xenogenic heart valve: Secondary | ICD-10-CM

## 2018-01-24 NOTE — Progress Notes (Signed)
Daily Session Note  Patient Details  Name: CALIE BUTTREY MRN: [id number] Date of Birth: April 29, 1946 Referring Provider:     CARDIAC REHAB PHASE II ORIENTATION from 12/06/2017 in [place]  Referring Provider  Dr. Percival Spanish      Encounter Date: 01/24/2018  Check In: Session Check In - 01/24/18 1509      Check-In   Location  [facility]    Staff Present  Suzanne Boron, BS, EP, Exercise Physiologist;Meryl Hubers Wynetta Emery, RN, BSN    Supervising physician immediately available to respond to emergencies  See telemetry face sheet for immediately available MD    Medication changes reported      No    Fall or balance concerns reported     Yes    Comments  Patient has fallen in the past so she is afraid of falling.     Warm-up and Cool-down  Performed as group-led Higher education careers adviser Performed  Yes    VAD Patient?  No      Pain Assessment   Currently in Pain?  No/denies    Multiple Pain Sites  No       Capillary Blood Glucose: No results found for this or any previous visit (from the past 24 hour(s)).    Social History   Tobacco Use  Smoking Status Never Smoker  Smokeless Tobacco Never Used    Goals Met:  Independence with exercise equipment Exercise tolerated well No report of cardiac concerns or symptoms Strength training completed today  Goals Unmet:  Not Applicable  Comments: Check out 1645.   Dr. Kate Sable is Medical Director for [facility] Cardiac and Pulmonary Rehab.

## 2018-01-27 ENCOUNTER — Encounter (HOSPITAL_COMMUNITY)
Admission: RE | Admit: 2018-01-27 | Discharge: 2018-01-27 | Disposition: A | Discharge status: Home / Self Care | Payer: Medicare Other | Source: Ambulatory Visit | Attending: Cardiology | Admitting: Cardiology

## 2018-01-27 ENCOUNTER — Telehealth: Payer: Self-pay | Admitting: Cardiology

## 2018-01-27 DIAGNOSIS — Z953 Presence of xenogenic heart valve: Secondary | ICD-10-CM

## 2018-01-27 DIAGNOSIS — Z48812 Encounter for surgical aftercare following surgery on the circulatory system: Secondary | ICD-10-CM | POA: Diagnosis not present

## 2018-01-27 NOTE — Telephone Encounter (Signed)
Closed Encounter  °

## 2018-01-27 NOTE — Progress Notes (Signed)
Daily Session Note  Patient Details  Name: Toni Cooper MRN: [id number] Date of Birth: 02-Jul-1946 Referring Provider:     CARDIAC REHAB PHASE II ORIENTATION from 12/06/2017 in [place]  Referring Provider  Dr. Percival Spanish      Encounter Date: 01/27/2018  Check In: Session Check In - 01/27/18 1519      Check-In   Location  [facility]    Staff Present  Russella Dar, MS, EP, [facility], Exercise Physiologist;Kyria Bumgardner Wynetta Emery, RN, BSN    Supervising physician immediately available to respond to emergencies  See telemetry face sheet for immediately available MD    Medication changes reported      No    Fall or balance concerns reported     Yes    Comments  Patient has fallen in the past so she is afraid of falling.     Warm-up and Cool-down  Performed as group-led Higher education careers adviser Performed  Yes    VAD Patient?  No      Pain Assessment   Currently in Pain?  No/denies    Multiple Pain Sites  No       Capillary Blood Glucose: No results found for this or any previous visit (from the past 24 hour(s)).    Social History   Tobacco Use  Smoking Status Never Smoker  Smokeless Tobacco Never Used    Goals Met:  Independence with exercise equipment Exercise tolerated well No report of cardiac concerns or symptoms Strength training completed today  Goals Unmet:  Not Applicable  Comments: Check out 1645.   Dr. Kate Sable is Medical Director for [facility] Cardiac and Pulmonary Rehab.

## 2018-01-28 ENCOUNTER — Ambulatory Visit (HOSPITAL_COMMUNITY): Payer: Medicare Other | Attending: Cardiology

## 2018-01-28 ENCOUNTER — Other Ambulatory Visit: Payer: Self-pay

## 2018-01-28 DIAGNOSIS — Z9889 Other specified postprocedural states: Secondary | ICD-10-CM | POA: Insufficient documentation

## 2018-01-28 DIAGNOSIS — E119 Type 2 diabetes mellitus without complications: Secondary | ICD-10-CM | POA: Insufficient documentation

## 2018-01-28 DIAGNOSIS — I350 Nonrheumatic aortic (valve) stenosis: Secondary | ICD-10-CM | POA: Diagnosis present

## 2018-01-28 DIAGNOSIS — I10 Essential (primary) hypertension: Secondary | ICD-10-CM | POA: Insufficient documentation

## 2018-01-28 DIAGNOSIS — E785 Hyperlipidemia, unspecified: Secondary | ICD-10-CM | POA: Insufficient documentation

## 2018-01-29 ENCOUNTER — Encounter (HOSPITAL_COMMUNITY)
Admission: RE | Admit: 2018-01-29 | Discharge: 2018-01-29 | Disposition: A | Discharge status: Home / Self Care | Payer: Medicare Other | Source: Ambulatory Visit | Attending: Cardiology | Admitting: Cardiology

## 2018-01-29 DIAGNOSIS — Z48812 Encounter for surgical aftercare following surgery on the circulatory system: Secondary | ICD-10-CM | POA: Diagnosis not present

## 2018-01-29 DIAGNOSIS — Z953 Presence of xenogenic heart valve: Secondary | ICD-10-CM

## 2018-01-29 NOTE — Progress Notes (Signed)
Daily Session Note  Patient Details  Name: Toni Cooper MRN: [id number] Date of Birth: Jan 29, 1946 Referring Provider:     CARDIAC REHAB PHASE II ORIENTATION from 12/06/2017 in [place]  Referring Provider  Dr. Percival Spanish      Encounter Date: 01/29/2018  Check In: Session Check In - 01/29/18 1501      Check-In   Location  [facility]    Staff Present  Russella Dar, MS, EP, [facility], Exercise Physiologist;Lezley Bedgood Wynetta Emery, RN, BSN    Supervising physician immediately available to respond to emergencies  See telemetry face sheet for immediately available MD    Medication changes reported      No    Fall or balance concerns reported     Yes    Comments  Patient has fallen in the past so she is afraid of falling.     Warm-up and Cool-down  Performed as group-led Higher education careers adviser Performed  Yes    VAD Patient?  No      Pain Assessment   Currently in Pain?  No/denies    Multiple Pain Sites  No       Capillary Blood Glucose: No results found for this or any previous visit (from the past 24 hour(s)).    Social History   Tobacco Use  Smoking Status Never Smoker  Smokeless Tobacco Never Used    Goals Met:  Independence with exercise equipment Exercise tolerated well No report of cardiac concerns or symptoms Strength training completed today  Goals Unmet:  Not Applicable  Comments: Check out 1645.   Dr. Kate Sable is Medical Director for [facility] Cardiac and Pulmonary Rehab.

## 2018-01-31 ENCOUNTER — Encounter (HOSPITAL_COMMUNITY)
Admission: RE | Admit: 2018-01-31 | Discharge: 2018-01-31 | Disposition: A | Discharge status: Home / Self Care | Payer: Medicare Other | Source: Ambulatory Visit | Attending: Cardiology | Admitting: Cardiology

## 2018-01-31 DIAGNOSIS — Z48812 Encounter for surgical aftercare following surgery on the circulatory system: Secondary | ICD-10-CM | POA: Diagnosis not present

## 2018-01-31 DIAGNOSIS — Z953 Presence of xenogenic heart valve: Secondary | ICD-10-CM

## 2018-01-31 NOTE — Progress Notes (Signed)
Daily Session Note  Patient Details  Name: EVAN OSBURN MRN: [id number] Date of Birth: 1946/07/17 Referring Provider:     CARDIAC REHAB PHASE II ORIENTATION from 12/06/2017 in [place]  Referring Provider  Dr. Percival Spanish      Encounter Date: 01/31/2018  Check In: Session Check In - 01/31/18 1519      Check-In   Location  [facility]    Staff Present  Diane Angelina Pih, MS, EP, [facility], Exercise Physiologist;Clayden Withem Wynetta Emery, RN, BSN    Supervising physician immediately available to respond to emergencies  See telemetry face sheet for immediately available MD    Medication changes reported      No    Fall or balance concerns reported     Yes    Comments  Patient has fallen in the past so she is afraid of falling.     Warm-up and Cool-down  Performed as group-led Higher education careers adviser Performed  Yes    VAD Patient?  No      Pain Assessment   Currently in Pain?  No/denies    Multiple Pain Sites  No       Capillary Blood Glucose: No results found for this or any previous visit (from the past 24 hour(s)).    Social History   Tobacco Use  Smoking Status Never Smoker  Smokeless Tobacco Never Used    Goals Met:  Independence with exercise equipment Exercise tolerated well No report of cardiac concerns or symptoms Strength training completed today  Goals Unmet:  Not Applicable  Comments: Check out 1645.   Dr. Kate Sable is Medical Director for [facility] Cardiac and Pulmonary Rehab.

## 2018-02-03 ENCOUNTER — Encounter (HOSPITAL_COMMUNITY)
Admission: RE | Admit: 2018-02-03 | Discharge: 2018-02-03 | Disposition: A | Discharge status: Home / Self Care | Payer: Medicare Other | Source: Ambulatory Visit | Attending: Cardiology | Admitting: Cardiology

## 2018-02-03 DIAGNOSIS — Z48812 Encounter for surgical aftercare following surgery on the circulatory system: Secondary | ICD-10-CM | POA: Diagnosis not present

## 2018-02-03 DIAGNOSIS — Z953 Presence of xenogenic heart valve: Secondary | ICD-10-CM

## 2018-02-03 NOTE — Progress Notes (Signed)
Daily Session Note  Patient Details  Name: Toni Cooper MRN: [id number] Date of Birth: Oct 18, 1946 Referring Provider:     CARDIAC REHAB PHASE II ORIENTATION from 12/06/2017 in [place]  Referring Provider  Dr. Percival Spanish      Encounter Date: 02/03/2018  Check In: Session Check In - 02/03/18 1511      Check-In   Location  [facility]    Staff Present  Diane Angelina Pih, MS, EP, [facility], Exercise Physiologist;Antonius Hartlage Wynetta Emery, RN, BSN    Supervising physician immediately available to respond to emergencies  See telemetry face sheet for immediately available MD    Medication changes reported      No    Fall or balance concerns reported     Yes    Comments  Patient has fallen in the past so she is afraid of falling.     Warm-up and Cool-down  Performed as group-led Higher education careers adviser Performed  Yes    VAD Patient?  No      Pain Assessment   Currently in Pain?  No/denies    Multiple Pain Sites  No       Capillary Blood Glucose: No results found for this or any previous visit (from the past 24 hour(s)).    Social History   Tobacco Use  Smoking Status Never Smoker  Smokeless Tobacco Never Used    Goals Met:  Independence with exercise equipment Exercise tolerated well No report of cardiac concerns or symptoms Strength training completed today  Goals Unmet:  Not Applicable  Comments: Check out 1645.   Dr. Kate Sable is Medical Director for [facility] Cardiac and Pulmonary Rehab.

## 2018-02-05 ENCOUNTER — Ambulatory Visit: Payer: Self-pay | Admitting: Cardiology

## 2018-02-05 ENCOUNTER — Encounter (HOSPITAL_COMMUNITY)
Admission: RE | Admit: 2018-02-05 | Discharge: 2018-02-05 | Disposition: A | Discharge status: Home / Self Care | Payer: Medicare Other | Source: Ambulatory Visit | Attending: Cardiology | Admitting: Cardiology

## 2018-02-05 DIAGNOSIS — Z953 Presence of xenogenic heart valve: Secondary | ICD-10-CM

## 2018-02-05 DIAGNOSIS — Z48812 Encounter for surgical aftercare following surgery on the circulatory system: Secondary | ICD-10-CM | POA: Diagnosis not present

## 2018-02-05 NOTE — Progress Notes (Signed)
Daily Session Note  Patient Details  Name: Toni Cooper MRN: [id number] Date of Birth: June 01, 1946 Referring Provider:     CARDIAC REHAB PHASE II ORIENTATION from 12/06/2017 in [place]  Referring Provider  Dr. Percival Spanish      Encounter Date: 02/05/2018  Check In: Session Check In - 02/05/18 1519      Check-In   Location  [facility]    Staff Present  Toni Angelina Pih, MS, EP, [facility], Exercise Physiologist;Toni Cooper Wynetta Emery, RN, BSN    Supervising physician immediately available to respond to emergencies  See telemetry face sheet for immediately available MD    Medication changes reported      No    Fall or balance concerns reported     Yes    Comments  Patient has fallen in the past so she is afraid of falling.     Warm-up and Cool-down  Performed as group-led Higher education careers adviser Performed  Yes    VAD Patient?  No      Pain Assessment   Currently in Pain?  No/denies    Multiple Pain Sites  No       Capillary Blood Glucose: No results found for this or any previous visit (from the past 24 hour(s)).    Social History   Tobacco Use  Smoking Status Never Smoker  Smokeless Tobacco Never Used    Goals Met:  Independence with exercise equipment Exercise tolerated well No report of cardiac concerns or symptoms Strength training completed today  Goals Unmet:  Not Applicable  Comments: Check out 1645.   Dr. Kate Sable is Medical Director for [facility] Cardiac and Pulmonary Rehab.

## 2018-02-07 ENCOUNTER — Encounter (HOSPITAL_COMMUNITY)
Admission: RE | Admit: 2018-02-07 | Discharge: 2018-02-07 | Disposition: A | Discharge status: Home / Self Care | Payer: Medicare Other | Source: Ambulatory Visit | Attending: Cardiology | Admitting: Cardiology

## 2018-02-07 DIAGNOSIS — Z953 Presence of xenogenic heart valve: Secondary | ICD-10-CM

## 2018-02-07 DIAGNOSIS — Z48812 Encounter for surgical aftercare following surgery on the circulatory system: Secondary | ICD-10-CM | POA: Diagnosis not present

## 2018-02-07 NOTE — Progress Notes (Signed)
Daily Session Note  Patient Details  Name: Toni Cooper MRN: [id number] Date of Birth: 03/16/1946 Referring Provider:     CARDIAC REHAB PHASE II ORIENTATION from 12/06/2017 in [place] CARDIAC REHABILITATION  Referring Provider  Dr. Hochrein      Encounter Date: 02/07/2018  Check In: Session Check In - 02/07/18 1544      Check-In   Location  [facility]    Staff Present  Diane Coad, MS, EP, [facility], Exercise Physiologist;Debra Johnson, RN, BSN    Supervising physician immediately available to respond to emergencies  See telemetry face sheet for immediately available MD    Medication changes reported      No    Fall or balance concerns reported     Yes    Comments  Patient has fallen in the past so she is afraid of falling.     Warm-up and Cool-down  Performed as group-led instruction    Resistance Training Performed  Yes    VAD Patient?  No      Pain Assessment   Currently in Pain?  No/denies    Multiple Pain Sites  No       Capillary Blood Glucose: No results found for this or any previous visit (from the past 24 hour(s)).    Social History   Tobacco Use  Smoking Status Never Smoker  Smokeless Tobacco Never Used    Goals Met:  Independence with exercise equipment Exercise tolerated well No report of cardiac concerns or symptoms Strength training completed today  Goals Unmet:  Not Applicable  Comments: Check out 1645.   Dr. Suresh Koneswaran is Medical Director for [place] Cardiac and Pulmonary Rehab. 

## 2018-02-10 ENCOUNTER — Encounter (HOSPITAL_COMMUNITY)
Admission: RE | Admit: 2018-02-10 | Discharge: 2018-02-10 | Disposition: A | Discharge status: Home / Self Care | Payer: Medicare Other | Source: Ambulatory Visit | Attending: Cardiology | Admitting: Cardiology

## 2018-02-10 DIAGNOSIS — Z48812 Encounter for surgical aftercare following surgery on the circulatory system: Secondary | ICD-10-CM | POA: Diagnosis not present

## 2018-02-10 DIAGNOSIS — Z953 Presence of xenogenic heart valve: Secondary | ICD-10-CM

## 2018-02-10 NOTE — Progress Notes (Signed)
Daily Session Note  Patient Details  Name: Toni Cooper MRN: [id number] Date of Birth: 05/02/1946 Referring Provider:     CARDIAC REHAB PHASE II ORIENTATION from 12/06/2017 in [place]  Referring Provider  Dr. Percival Spanish      Encounter Date: 02/10/2018  Check In: Session Check In - 02/10/18 1545      Check-In   Location  [facility]    Staff Present  Diane Angelina Pih, MS, EP, [facility], Exercise Physiologist;Koraima Albertsen Wynetta Emery, RN, BSN    Supervising physician immediately available to respond to emergencies  See telemetry face sheet for immediately available MD    Medication changes reported      No    Fall or balance concerns reported     Yes    Comments  Patient has fallen in the past so she is afraid of falling.     Warm-up and Cool-down  Performed as group-led Higher education careers adviser Performed  Yes    VAD Patient?  No      Pain Assessment   Currently in Pain?  No/denies    Multiple Pain Sites  No       Capillary Blood Glucose: No results found for this or any previous visit (from the past 24 hour(s)).    Social History   Tobacco Use  Smoking Status Never Smoker  Smokeless Tobacco Never Used    Goals Met:  Independence with exercise equipment Exercise tolerated well No report of cardiac concerns or symptoms Strength training completed today  Goals Unmet:  Not Applicable  Comments: Check out 1645.   Dr. Kate Sable is Medical Director for [facility] Cardiac and Pulmonary Rehab.

## 2018-02-12 ENCOUNTER — Encounter (HOSPITAL_COMMUNITY)
Admission: RE | Admit: 2018-02-12 | Discharge: 2018-02-12 | Disposition: A | Discharge status: Home / Self Care | Payer: Medicare Other | Source: Ambulatory Visit | Attending: Cardiology | Admitting: Cardiology

## 2018-02-12 DIAGNOSIS — Z953 Presence of xenogenic heart valve: Secondary | ICD-10-CM

## 2018-02-12 DIAGNOSIS — Z48812 Encounter for surgical aftercare following surgery on the circulatory system: Secondary | ICD-10-CM | POA: Diagnosis not present

## 2018-02-12 NOTE — Progress Notes (Signed)
Daily Session Note  Patient Details  Name: IRIANNA GILDAY MRN: [id number] Date of Birth: 08/04/1946 Referring Provider:     CARDIAC REHAB PHASE II ORIENTATION from 12/06/2017 in [place]  Referring Provider  Dr. Percival Spanish      Encounter Date: 02/12/2018  Check In: Session Check In - 02/12/18 1545      Check-In   Location  [facility]    Staff Present  Diane Angelina Pih, MS, EP, [facility], Exercise Physiologist;Abdishakur Gottschall Wynetta Emery, RN, BSN    Supervising physician immediately available to respond to emergencies  See telemetry face sheet for immediately available MD    Medication changes reported      No    Fall or balance concerns reported     Yes    Comments  Patient has fallen in the past so she is afraid of falling.     Warm-up and Cool-down  Performed as group-led Higher education careers adviser Performed  Yes    VAD Patient?  No      Pain Assessment   Currently in Pain?  No/denies    Multiple Pain Sites  No       Capillary Blood Glucose: No results found for this or any previous visit (from the past 24 hour(s)).  Exercise Prescription Changes - 02/12/18 1400      Response to Exercise   Blood Pressure (Admit)  140/60    Blood Pressure (Exercise)  140/60    Blood Pressure (Exit)  120/60    Heart Rate (Admit)  69 bpm    Heart Rate (Exercise)  90 bpm    Heart Rate (Exit)  77 bpm    Rating of Perceived Exertion (Exercise)  12    Duration  Progress to 30 minutes of  aerobic without signs/symptoms of physical distress    Intensity  THRR unchanged [id number]      Progression   Progression  Continue to progress workloads to maintain intensity without signs/symptoms of physical distress.      Resistance Training   Training Prescription  Yes    Weight  2    Reps  10-15      Treadmill   MPH  1.7    Grade  0    Minutes  15    METs  2.3      NuStep   Level  2    SPM  90    Minutes  20    METs  2      Home Exercise Plan   Plans to continue  exercise at  Home (comment)    Frequency  Add 2 additional days to program exercise sessions.    Initial Home Exercises Provided  12/11/17       Social History   Tobacco Use  Smoking Status Never Smoker  Smokeless Tobacco Never Used    Goals Met:  Independence with exercise equipment Exercise tolerated well No report of cardiac concerns or symptoms Strength training completed today  Goals Unmet:  Not Applicable  Comments: Check out 1645.   Dr. Kate Sable is Medical Director for [facility] Cardiac and Pulmonary Rehab.

## 2018-02-13 NOTE — Progress Notes (Signed)
Cardiac Individual Treatment Plan  Patient Details  Name: Toni Cooper MRN: [id number] Date of Birth: [birth year] Referring Provider:     CARDIAC REHAB [facility] from 12/06/2017 in [place]  Referring Provider  Dr. Percival Spanish      Initial Encounter Date:    CARDIAC REHAB PHASE II ORIENTATION from 12/06/2017 in [place]  Date  12/06/17  Referring Provider  Dr. Percival Spanish      Visit Diagnosis: S/P aortic valve replacement with bioprosthetic valve  Patient's Home Medications on Admission:  Current Outpatient Medications:  .  acetaminophen (TYLENOL) 500 MG tablet, Take 500 mg 2 (two) times daily as needed by mouth for moderate pain or headache. , Disp: , Rfl:  .  Ascorbic Acid (VITAMIN C) 1000 MG tablet, Take 1,000 mg daily by mouth., Disp: , Rfl:  .  aspirin 81 MG EC tablet, Take 1 tablet (81 mg total) by mouth daily., Disp: , Rfl:  .  atorvastatin (LIPITOR) 10 MG tablet, Take 1 tablet (10 mg total) by mouth daily at 6 PM., Disp: 30 tablet, Rfl: 1 .  beta carotene w/minerals (OCUVITE) tablet, Take 1 tablet by mouth daily., Disp: , Rfl:  .  calcium carbonate (TUMS - DOSED IN MG ELEMENTAL CALCIUM) 500 MG chewable tablet, Chew 1 tablet by mouth daily as needed for indigestion or heartburn., Disp: , Rfl:  .  calcium-vitamin D (OSCAL 500/200 D-3) 500-200 MG-UNIT tablet, Take 1 tablet by mouth 2 (two) times daily., Disp: , Rfl:  .  cholecalciferol (VITAMIN D) 1000 UNITS tablet, Take 1,000 Units by mouth daily., Disp: , Rfl:  .  fluticasone (FLONASE) 50 MCG/ACT nasal spray, Place 1 spray into both nostrils daily as needed for allergies or rhinitis., Disp: , Rfl:  .  HUMALOG 100 UNIT/ML injection, Inject 6-20 Units into the skin 3 (three) times daily with meals. Using sliding scale., Disp: , Rfl:  .  hydrochlorothiazide (HYDRODIURIL) 25 MG tablet, Take 1 tablet (25 mg total) daily by mouth., Disp: 90 tablet, Rfl: 1 .  LANTUS 100 UNIT/ML  injection, Inject 35 Units into the skin at bedtime. , Disp: , Rfl:  .  lisinopril (PRINIVIL,ZESTRIL) 20 MG tablet, Take 1 tablet (20 mg total) by mouth daily., Disp: 90 tablet, Rfl: 3 .  Magnesium 250 MG TABS, Take 250 mg by mouth daily. , Disp: , Rfl:  .  metFORMIN (GLUCOPHAGE-XR) 500 MG 24 hr tablet, Take 1 tablet (500 mg total) 2 (two) times daily by mouth., Disp: 180 tablet, Rfl: 1 .  metoprolol tartrate (LOPRESSOR) 12.5 mg TABS tablet, Take 12.5 mg by mouth 2 (two) times daily., Disp: , Rfl:  .  Multiple Vitamins-Minerals (MULTI-BETIC DIABETES) TABS, Take 1 tablet daily by mouth., Disp: , Rfl:  .  oxyCODONE (OXY IR/ROXICODONE) 5 MG immediate release tablet, Take 1 tablet (5 mg total) by mouth every 4 (four) hours as needed for severe pain., Disp: 30 tablet, Rfl: 0 .  pyridOXINE (VITAMIN B-6) 100 MG tablet, Take 100 mg by mouth daily., Disp: , Rfl:  .  TRUETRACK TEST test strip, Test four times daily., Disp: , Rfl:   Past Medical History: Past Medical History:  Diagnosis Date  . Aortic stenosis, severe 2015  . Cataract   . Diabetes mellitus without complication ([place])   . Dyspnea   . Hypertension   . Obesity   . Osteoporosis   . S/P minimally invasive aortic valve replacement with bioprosthetic valve 10/16/2017   23 mm [employer]  Elite rapid deployment bovine stented bioprosthetic tissue valve via partial upper sternotomy    Tobacco Use: Social History   Tobacco Use  Smoking Status Never Smoker  Smokeless Tobacco Never Used    Labs: Recent Review Flowsheet Data    Labs for ITP Cardiac and Pulmonary Rehab Latest Ref Rng & Units 10/16/2017 10/16/2017 10/16/2017 10/16/2017 10/17/2017   Cholestrol 100 - 199 mg/dL - - - - -   LDLCALC 0 - 99 mg/dL - - - - -   HDL >39 mg/dL - - - - -   Trlycerides 0 - 149 mg/dL - - - - -   Hemoglobin A1c 4.8 - 5.6 % - - - - -   PHART 7.350 - 7.450 7.334(L) 7.323(L) - 7.313(L) 7.360   PCO2ART 32.0 - 48.0 mmHg 43.1 44.9 - 44.4 44.6   HCO3  20.0 - 28.0 mmol/L 23.1 23.4 - 22.6 24.6   TCO2 22 - 32 mmol/L 24 25 24 24 24    ACIDBASEDEF 0.0 - 2.0 mmol/L 3.0(H) 3.0(H) - 4.0(H) 0.2   O2SAT % 98.0 96.0 - 94.0 94.4      Capillary Blood Glucose: Lab Results  Component Value Date   GLUCAP 223 (H) 10/21/2017   GLUCAP 175 (H) 10/21/2017   GLUCAP 208 (H) 10/20/2017   GLUCAP 149 (H) 10/20/2017   GLUCAP 223 (H) 10/20/2017     Exercise Target Goals:    Exercise Program Goal: Individual exercise prescription set using results from initial 6 min walk test and THRR while considering  patient's activity barriers and safety.   Exercise Prescription Goal: Starting with aerobic activity 30 plus minutes a day, 3 days per week for initial exercise prescription. Provide home exercise prescription and guidelines that participant acknowledges understanding prior to discharge.  Activity Barriers & Risk Stratification: Activity Barriers & Cardiac Risk Stratification - 12/06/17 1412      Activity Barriers & Cardiac Risk Stratification   Activity Barriers  History of Falls    Cardiac Risk Stratification  High       6 Minute Walk: 6 Minute Walk    Row Name 12/06/17 1418         6 Minute Walk   Phase  Initial     Distance  1000 feet     Distance % Change  0 %     Distance Feet Change  0 ft     Walk Time  6 minutes     # of Rest Breaks  0     MPH  1.89     METS  2.45     RPE  12     Perceived Dyspnea   9     VO2 Peak  6.81     Symptoms  No     Resting HR  68 bpm     Resting BP  120/60     Resting Oxygen Saturation   98 %     Exercise Oxygen Saturation  during 6 min walk  95 %     Max Ex. HR  102 bpm     Max Ex. BP  148/70     2 Minute Post BP  120/66        Oxygen Initial Assessment:   Oxygen Re-Evaluation:   Oxygen Discharge (Final Oxygen Re-Evaluation):   Initial Exercise Prescription: Initial Exercise Prescription - 12/06/17 1400      Date of Initial Exercise RX and Referring Provider   Date  12/06/17     Referring Provider  Dr. Percival Spanish      Treadmill   MPH  1    Grade  0    Minutes  15    METs  1.7      NuStep   Level  2    SPM  54    Minutes  20    METs  1.6      Prescription Details   Frequency (times per week)  3    Duration  Progress to 30 minutes of continuous aerobic without signs/symptoms of physical distress      Intensity   THRR 40-80% of Max Heartrate  100-116-132    Ratings of Perceived Exertion  11-13    Perceived Dyspnea  0-4      Progression   Progression  Continue progressive overload as per policy without signs/symptoms or physical distress.      Resistance Training   Training Prescription  Yes    Weight  1    Reps  10-15       Perform Capillary Blood Glucose checks as needed.  Exercise Prescription Changes:  Exercise Prescription Changes    Row Name 12/12/17 0700 12/17/17 0700 01/01/18 1400 01/16/18 1200 02/04/18 0800     Response to Exercise   Blood Pressure (Admit)  -  144/62  130/70  130/60  140/60   Blood Pressure (Exercise)  -  146/60  130/70  142/60  150/72   Blood Pressure (Exit)  -  142/62  142/62  130/60  128/60   Heart Rate (Admit)  -  69 bpm  76 bpm  61 bpm  70 bpm   Heart Rate (Exercise)  -  109 bpm  98 bpm  97 bpm  105 bpm   Heart Rate (Exit)  -  78 bpm  81 bpm  77 bpm  79 bpm   Rating of Perceived Exertion (Exercise)  -  13  13  12  12    Duration  -  Progress to 30 minutes of  aerobic without signs/symptoms of physical distress  Progress to 30 minutes of  aerobic without signs/symptoms of physical distress  Progress to 30 minutes of  aerobic without signs/symptoms of physical distress  Progress to 30 minutes of  aerobic without signs/symptoms of physical distress   Intensity  -  THRR New 101-116-132  THRR New 101-119-134  THRR New [id number]  THRR New 101-117-132     Progression   Progression  -  Continue to progress workloads to maintain intensity without signs/symptoms of physical distress.  Continue to progress workloads to  maintain intensity without signs/symptoms of physical distress.  Continue to progress workloads to maintain intensity without signs/symptoms of physical distress.  Continue to progress workloads to maintain intensity without signs/symptoms of physical distress.     Resistance Training   Training Prescription  Yes  Yes  Yes  Yes  Yes   Weight  1  1  2  2  2    Reps  10-15  10-15  10-15  10-15  10-15     Treadmill   MPH  1  1.2  1.3  1.4  1.6   Grade  0  0  0  0  0   Minutes  15  15  15  15  15    METs  1.7  1.9  1.9  2  2.2     NuStep   Level  2  2  2  2  2    SPM  54  75  78  83  86   Minutes  20  20  20  20  20    METs  1.6  1.9  1.9  1.9  2     Home Exercise Plan   Plans to continue exercise at  Home (comment)  Home (comment)  Home (comment)  Home (comment)  Home (comment)   Frequency  Add 2 additional days to program exercise sessions.  Add 2 additional days to program exercise sessions.  Add 2 additional days to program exercise sessions.  Add 2 additional days to program exercise sessions.  Add 2 additional days to program exercise sessions.   Initial Home Exercises Provided  12/11/17  12/11/17  12/11/17  12/11/17  12/11/17   Row Name 02/12/18 1400             Response to Exercise   Blood Pressure (Admit)  140/60       Blood Pressure (Exercise)  140/60       Blood Pressure (Exit)  120/60       Heart Rate (Admit)  69 bpm       Heart Rate (Exercise)  90 bpm       Heart Rate (Exit)  77 bpm       Rating of Perceived Exertion (Exercise)  12       Duration  Progress to 30 minutes of  aerobic without signs/symptoms of physical distress       Intensity  THRR unchanged 101-117-132         Progression   Progression  Continue to progress workloads to maintain intensity without signs/symptoms of physical distress.         Resistance Training   Training Prescription  Yes       Weight  2       Reps  10-15         Treadmill   MPH  1.7       Grade  0       Minutes  15       METs   2.3         NuStep   Level  2       SPM  90       Minutes  20       METs  2         Home Exercise Plan   Plans to continue exercise at  Home (comment)       Frequency  Add 2 additional days to program exercise sessions.       Initial Home Exercises Provided  12/11/17          Exercise Comments:  Exercise Comments    Row Name 12/12/17 0746 12/17/17 0742 01/01/18 1426 01/01/18 1427 01/16/18 1251   Exercise Comments  Patient recieved the take home exercise plan today 12/11/2017. THR was addressed as were safety guidelines for being active when not in CR. Patient demonstrated an understanding and was encouraged to ask any future questions as they arise.   Patient is doing well in CR. She has increased her speed on the treadmill aswell as her SPMs on the Nustep. She has only just started and more progressions will appear over time,.   -  Patient is doing well in CR and has maintained both her nustep level and her SPMs. Patient has increased her speed on the treadmill as well as her weight for the warm up resistance. Patient can not tell a differnece yet from the program  however it has still only been a total of 10 sessions.   Patient continues to do well in CR and has increased her speed on the treadmill and has maintained her level and SPMs on the Nustep machine. Patient has also decreased her level on the RPE scale showing that she is feeling more confortable on the equipment that before. Patient will continued to be progressed and monitored throughout the program.   Bethany Name 02/04/18 0803 02/12/18 1422         Exercise Comments  Patient is doing well in CR and has progressed her speed on the treadmill from 1.4 to 1.6 and has also increased her SPMs on the nustep machine. Patient has gained stamina from the programed and will continue to be monitored for further results.   Patient is doing well in CR and has increased her speed on the treadmill from 1.6 to 1.7. Patient has also increased her SPMs  on the nustep while maintaining her level as well. Patient states that she is feeling stronger and is able to do more when not in Cr.          Exercise Goals and Review:  Exercise Goals    Row Name 12/06/17 1420             Exercise Goals   Increase Physical Activity  Yes       Intervention  Provide advice, education, support and counseling about physical activity/exercise needs.;Develop an individualized exercise prescription for aerobic and resistive training based on initial evaluation findings, risk stratification, comorbidities and participant's personal goals.       Expected Outcomes  Short Term: Attend rehab on a regular basis to increase amount of physical activity.       Increase Strength and Stamina  Yes       Intervention  Develop an individualized exercise prescription for aerobic and resistive training based on initial evaluation findings, risk stratification, comorbidities and participant's personal goals.;Provide advice, education, support and counseling about physical activity/exercise needs.       Expected Outcomes  Short Term: Increase workloads from initial exercise prescription for resistance, speed, and METs.       Able to understand and use rate of perceived exertion (RPE) scale  Yes       Intervention  Provide education and explanation on how to use RPE scale       Expected Outcomes  Short Term: Able to use RPE daily in rehab to express subjective intensity level;Long Term:  Able to use RPE to guide intensity level when exercising independently       Able to understand and use Dyspnea scale  Yes       Intervention  Provide education and explanation on how to use Dyspnea scale       Expected Outcomes  Short Term: Able to use Dyspnea scale daily in rehab to express subjective sense of shortness of breath during exertion;Long Term: Able to use Dyspnea scale to guide intensity level when exercising independently       Knowledge and understanding of Target Heart Rate Range  (THRR)  Yes       Intervention  Provide education and explanation of THRR including how the numbers were predicted and where they are located for reference       Expected Outcomes  Short Term: Able to state/look up THRR       Able to check pulse independently  Yes       Intervention  Provide education and  demonstration on how to check pulse in carotid and radial arteries.;Review the importance of being able to check your own pulse for safety during independent exercise       Expected Outcomes  Short Term: Able to explain why pulse checking is important during independent exercise;Long Term: Able to check pulse independently and accurately       Understanding of Exercise Prescription  Yes       Intervention  Provide education, explanation, and written materials on patient's individual exercise prescription       Expected Outcomes  Short Term: Able to explain program exercise prescription;Long Term: Able to explain home exercise prescription to exercise independently          Exercise Goals Re-Evaluation : Exercise Goals Re-Evaluation    Row Name 01/01/18 1425 01/16/18 1249 02/12/18 1420         Exercise Goal Re-Evaluation   Exercise Goals Review  Increase Physical Activity;Increase Strength and Stamina;Knowledge and understanding of Target Heart Rate Range (THRR)  Increase Physical Activity;Increase Strength and Stamina;Knowledge and understanding of Target Heart Rate Range (THRR)  Increase Physical Activity;Increase Strength and Stamina;Knowledge and understanding of Target Heart Rate Range (THRR)     Comments  Patient is doing well in CR and has maintained both her nustep level and her SPMs. Patient has increased her speed on the treadmill as well as her weight for the warm up resistance. Patient can not tell a differnece yet from the program however it has still only been a total of 10 sessions.   Patient continues to do well in CR and has increased her speed on the treadmill and has maintained  her level and SPMs on the Nustep machine. Patient has also decreased her level on the RPE scale showing that she is feeling more confortable on the equipment that before. Patient will continued to be progressed and monitored throughout the program.   Patient is doing well in CR and has increased her speed on the treadmill from 1.6 to 1.7. Patient has also increased her SPMs on the nustep while maintaining her level as well. Patient states that she is feeling stronger and is able to do more when not in Cr.      Expected Outcomes  Patient wishes to lose weight and to be able to domore activities out doors.   Patient wishes to lose weight and to be able to domore activities out doors.   Patient wishes to lose weight and to be able to domore activities out doors.          Discharge Exercise Prescription (Final Exercise Prescription Changes): Exercise Prescription Changes - 02/12/18 1400      Response to Exercise   Blood Pressure (Admit)  140/60    Blood Pressure (Exercise)  140/60    Blood Pressure (Exit)  120/60    Heart Rate (Admit)  69 bpm    Heart Rate (Exercise)  90 bpm    Heart Rate (Exit)  77 bpm    Rating of Perceived Exertion (Exercise)  12    Duration  Progress to 30 minutes of  aerobic without signs/symptoms of physical distress    Intensity  THRR unchanged 101-117-132      Progression   Progression  Continue to progress workloads to maintain intensity without signs/symptoms of physical distress.      Resistance Training   Training Prescription  Yes    Weight  2    Reps  10-15      Treadmill  MPH  1.7    Grade  0    Minutes  15    METs  2.3      NuStep   Level  2    SPM  90    Minutes  20    METs  2      Home Exercise Plan   Plans to continue exercise at  Home (comment)    Frequency  Add 2 additional days to program exercise sessions.    Initial Home Exercises Provided  12/11/17       Nutrition:  Target Goals: Understanding of nutrition guidelines, daily intake  of sodium 1500mg , cholesterol 200mg , calories 30% from fat and 7% or less from saturated fats, daily to have 5 or more servings of fruits and vegetables.  Biometrics: Pre Biometrics - 12/06/17 1420      Pre Biometrics   Height  5\' 4"  (1.626 m)    Weight  206 lb (93.4 kg)    Waist Circumference  47 inches    Hip Circumference  47.5 inches    Waist to Hip Ratio  0.99 %    BMI (Calculated)  35.34    Triceps Skinfold  22 mm    % Body Fat  46.4 %    Grip Strength  38.8 kg    Flexibility  0 in    Single Leg Stand  0 seconds        Nutrition Therapy Plan and Nutrition Goals: Nutrition Therapy & Goals - 02/13/18 1559      Nutrition Therapy   RD appointment deferred  Yes      Personal Nutrition Goals   Personal Goal #2  She eats heart healthy, low carb, low sugar.     Additional Goals?  No       Nutrition Assessments: Nutrition Assessments - 12/06/17 1445      MEDFICTS Scores   Pre Score  18       Nutrition Goals Re-Evaluation:   Nutrition Goals Discharge (Final Nutrition Goals Re-Evaluation):   Psychosocial: Target Goals: Acknowledge presence or absence of significant depression and/or stress, maximize coping skills, provide positive support system. Participant is able to verbalize types and ability to use techniques and skills needed for reducing stress and depression.  Initial Review & Psychosocial Screening: Initial Psych Review & Screening - 12/06/17 1448      Initial Review   Current issues with  None Identified      Family Dynamics   Good Support System?  Yes      Barriers   Psychosocial barriers to participate in program  There are no identifiable barriers or psychosocial needs.      Screening Interventions   Interventions  Encouraged to exercise    Expected Outcomes  Short Term goal: Identification and review with participant of any Quality of Life or Depression concerns found by scoring the questionnaire.;Long Term goal: The participant improves  quality of Life and PHQ9 Scores as seen by post scores and/or verbalization of changes       Quality of Life Scores: Quality of Life - 12/06/17 1421      Quality of Life Scores   Health/Function Pre  26.58 %    Socioeconomic Pre  29.58 %    Psych/Spiritual Pre  27.43 %    Family Pre  30 %    GLOBAL Pre  27.9 %      Scores of 19 and below usually indicate a poorer quality of life in these areas.  A difference of  2-3 points is a clinically meaningful difference.  A difference of 2-3 points in the total score of the Quality of Life Index has been associated with significant improvement in overall quality of life, self-image, physical symptoms, and general health in studies assessing change in quality of life.  PHQ-9: Recent Review Flowsheet Data    Depression screen [facility] 2/9 12/06/2017 11/20/2017 10/07/2017 04/22/2017 04/04/2017   Decreased Interest 0 0 0 0 0   Down, Depressed, Hopeless 0 0 0 0 0   PHQ - 2 Score 0 0 0 0 0   Altered sleeping 0 - - - -   Tired, decreased energy 0 - - - -   Change in appetite 0 - - - -   Feeling bad or failure about yourself  0 - - - -   Trouble concentrating 0 - - - -   Moving slowly or fidgety/restless 0 - - - -   Suicidal thoughts 0 - - - -   PHQ-9 Score 0 - - - -     Interpretation of Total Score  Total Score Depression Severity:  1-4 = Minimal depression, 5-9 = Mild depression, 10-14 = Moderate depression, 15-19 = Moderately severe depression, 20-27 = Severe depression   Psychosocial Evaluation and Intervention: Psychosocial Evaluation - 12/06/17 1448      Psychosocial Evaluation & Interventions   Interventions  Encouraged to exercise with the program and follow exercise prescription    Continue Psychosocial Services   No Follow up required       Psychosocial Re-Evaluation: Psychosocial Re-Evaluation    Row Name 01/01/18 1501 01/20/18 1355 02/13/18 1604         Psychosocial Re-Evaluation   Current issues with  None Identified  None  Identified  None Identified     Comments  Patient's initial QOL score was 27.9 and her PHQ-9 score was 0. No psychosocial barriers identified.   Patient's initial QOL score was 27.9 and her PHQ-9 score was 0. No psychosocial barriers identified.   Patient's initial QOL score was 27.9 and her PHQ-9 score was 0. No psychosocial barriers identified.      Expected Outcomes  Patient will have no psychosocial barriers identified at discharge.   Patient will have no psychosocial barriers identified at discharge.   Patient will have no psychosocial barriers identified at discharge.      Interventions  Stress management education;Encouraged to attend Cardiac Rehabilitation for the exercise;Relaxation education  Stress management education;Encouraged to attend Cardiac Rehabilitation for the exercise;Relaxation education  Stress management education;Encouraged to attend Cardiac Rehabilitation for the exercise;Relaxation education     Continue Psychosocial Services   No Follow up required  No Follow up required  No Follow up required        Psychosocial Discharge (Final Psychosocial Re-Evaluation): Psychosocial Re-Evaluation - 02/13/18 1604      Psychosocial Re-Evaluation   Current issues with  None Identified    Comments  Patient's initial QOL score was 27.9 and her PHQ-9 score was 0. No psychosocial barriers identified.     Expected Outcomes  Patient will have no psychosocial barriers identified at discharge.     Interventions  Stress management education;Encouraged to attend Cardiac Rehabilitation for the exercise;Relaxation education    Continue Psychosocial Services   No Follow up required       Vocational Rehabilitation: Provide vocational rehab assistance to qualifying candidates.   Vocational Rehab Evaluation & Intervention:   Education: Education Goals: Education classes will be provided  on a weekly basis, covering required topics. Participant will state understanding/return demonstration of  topics presented.  Learning Barriers/Preferences: Learning Barriers/Preferences - 12/06/17 1441      Learning Barriers/Preferences   Learning Barriers  None    Learning Preferences  Written Material;Pictoral;Video       Education Topics: Hypertension, Hypertension Reduction -Define heart disease and high blood pressure. Discus how high blood pressure affects the body and ways to reduce high blood pressure.   CARDIAC REHAB PHASE II EXERCISE from 02/12/2018 in [place]  Date  12/25/17  Educator  DC  Instruction Review Code  2- Demonstrated Understanding      Exercise and Your Heart -Discuss why it is important to exercise, the FITT principles of exercise, normal and abnormal responses to exercise, and how to exercise safely.   CARDIAC REHAB PHASE II EXERCISE from 02/12/2018 in [place]  Date  01/01/18  Educator  DC  Instruction Review Code  2- Demonstrated Understanding      Angina -Discuss definition of angina, causes of angina, treatment of angina, and how to decrease risk of having angina.   CARDIAC REHAB PHASE II EXERCISE from 02/12/2018 in [place]  Date  01/08/18  Educator  DC  Instruction Review Code  2- Demonstrated Understanding      Cardiac Medications -Review what the following cardiac medications are used for, how they affect the body, and side effects that may occur when taking the medications.  Medications include Aspirin, Beta blockers, calcium channel blockers, ACE Inhibitors, angiotensin receptor blockers, diuretics, digoxin, and antihyperlipidemics.   CARDIAC REHAB PHASE II EXERCISE from 02/12/2018 in [place]  Date  01/16/18  Educator  DC  Instruction Review Code  2- Demonstrated Understanding      Congestive Heart Failure -Discuss the definition of CHF, how to live with CHF, the signs and symptoms of CHF, and how keep track of weight and sodium intake.    CARDIAC REHAB PHASE II EXERCISE from 02/12/2018 in [place]  Date  01/23/18  Educator  DC  Instruction Review Code  2- Demonstrated Understanding      Heart Disease and Intimacy -Discus the effect sexual activity has on the heart, how changes occur during intimacy as we age, and safety during sexual activity.   CARDIAC REHAB PHASE II EXERCISE from 02/12/2018 in [place]  Date  01/30/18  Educator  DC  Instruction Review Code  2- Demonstrated Understanding      Smoking Cessation / COPD -Discuss different methods to quit smoking, the health benefits of quitting smoking, and the definition of COPD.   CARDIAC REHAB PHASE II EXERCISE from 02/12/2018 in [place]  Date  02/06/18  Educator  DC  Instruction Review Code  2- Demonstrated Understanding      Nutrition I: Fats -Discuss the types of cholesterol, what cholesterol does to the heart, and how cholesterol levels can be controlled.   CARDIAC REHAB PHASE II EXERCISE from 02/12/2018 in [place]  Date  02/12/18  Educator  DC  Instruction Review Code  2- Demonstrated Understanding      Nutrition II: Labels -Discuss the different components of food labels and how to read food label   Heart Parts/Heart Disease and PAD -Discuss the anatomy of the heart, the pathway of blood circulation through the heart, and these are affected by heart disease.   Stress I: Signs and Symptoms -Discuss the causes of  stress, how stress may lead to anxiety and depression, and ways to limit stress.   Stress II: Relaxation -Discuss different types of relaxation techniques to limit stress.   CARDIAC REHAB PHASE II EXERCISE from 02/12/2018 in [place]  Date  12/11/17  Educator  DC  Instruction Review Code  2- Demonstrated Understanding      Warning Signs of Stroke / TIA -Discuss definition of a stroke, what the signs and symptoms  are of a stroke, and how to identify when someone is having stroke.   CARDIAC REHAB PHASE II EXERCISE from 02/12/2018 in [place]  Date  12/19/17  Educator  DC  Instruction Review Code  2- Demonstrated Understanding      Knowledge Questionnaire Score: Knowledge Questionnaire Score - 12/06/17 1441      Knowledge Questionnaire Score   Pre Score  24/24       Core Components/Risk Factors/Patient Goals at Admission: Personal Goals and Risk Factors at Admission - 12/06/17 1446      Core Components/Risk Factors/Patient Goals on Admission    Weight Management  Yes    Intervention  Weight Management/Obesity: Establish reasonable short term and long term weight goals.    Admit Weight  206 lb (93.4 kg)    Goal Weight: Short Term  201 lb (91.2 kg)    Goal Weight: Long Term  196 lb (88.9 kg)    Expected Outcomes  Short Term: Continue to assess and modify interventions until short term weight is achieved;Long Term: Adherence to nutrition and physical activity/exercise program aimed toward attainment of established weight goal    Personal Goal Other  Yes    Personal Goal  Lose weight, be able to do things outdoors.    Intervention  Attend CR 2 x week and supplement exercise at home 3 x week.     Expected Outcomes  Achieve personal goals       Core Components/Risk Factors/Patient Goals Review:  Goals and Risk Factor Review    Row Name 01/01/18 1458 01/20/18 1352 02/13/18 1559         Core Components/Risk Factors/Patient Goals Review   Personal Goals Review  Weight Management/Obesity Lose weight 10-15 lbs. Be able to do things outdoors.   Weight Management/Obesity Lose weight 10-15 lbs; be able to do things outdoors.   Weight Management/Obesity;Diabetes Lose weight 10-15 lbs; be able to do thing outdoors.      Review  Patient has completed 10 sessions maintaining her weight. She says she does not think the program has made a difference yet in how she feels. She has not  progressed on the treadmill due to fear it may cause hip pain. Will continue to monitor for progress.   Patient has completed 17 sessions maintaining her weight since her last 30 day review. She continues to do well in the program with progression. She says she feels stronger and has more energy since she started the program. She is not able to do thing outdoors yet due to the weather. She has progressed on the treadmill some with no complaints of hip pain. Will continue to monitor for progress.   Patient has completed 28 sessions gaining 3 lbs since last 30 day review. She continues to do well in the program with progression on the treadmill. She continues to gain strength and says her energy continues to increase. He gait has also improved on the treadmill with no complaints of hip pain.  Her DM remained controlled reporting fasting  readings WNL.  She says she plans to work in her flower garden soon. She says the program has really helped her overall. Will continue to monitor for progress.       Expected Outcomes  Patient will continue to attend sessions and complete the program meeting her personal goals.   Patient will continue to attend sessions and complete the program meeting her personal goals.   Patient will continue to attend sessions and complete the program meeting her personal goals.         Core Components/Risk Factors/Patient Goals at Discharge (Final Review):  Goals and Risk Factor Review - 02/13/18 1559      Core Components/Risk Factors/Patient Goals Review   Personal Goals Review  Weight Management/Obesity;Diabetes Lose weight 10-15 lbs; be able to do thing outdoors.     Review  Patient has completed 28 sessions gaining 3 lbs since last 30 day review. She continues to do well in the program with progression on the treadmill. She continues to gain strength and says her energy continues to increase. He gait has also improved on the treadmill with no complaints of hip pain.  Her DM remained  controlled reporting fasting readings WNL.  She says she plans to work in her flower garden soon. She says the program has really helped her overall. Will continue to monitor for progress.      Expected Outcomes  Patient will continue to attend sessions and complete the program meeting her personal goals.        ITP Comments: ITP Comments    Row Name 12/06/17 1443           ITP Comments  Mrs. Loney is a pleasant female that is ready to start CR. She has a fear of falling because she has fallen in the past and broken bones. She is still taking antibiotics for an infection in her drain tube site.           Comments: ITP 30 Day REVIEW Patient continues to do well in the program. Will continue to monitor for progress.

## 2018-02-14 ENCOUNTER — Encounter (HOSPITAL_COMMUNITY)
Admission: RE | Admit: 2018-02-14 | Discharge: 2018-02-14 | Disposition: A | Discharge status: Home / Self Care | Payer: Medicare Other | Source: Ambulatory Visit | Attending: Cardiology | Admitting: Cardiology

## 2018-02-14 DIAGNOSIS — Z48812 Encounter for surgical aftercare following surgery on the circulatory system: Secondary | ICD-10-CM | POA: Diagnosis not present

## 2018-02-14 DIAGNOSIS — Z953 Presence of xenogenic heart valve: Secondary | ICD-10-CM

## 2018-02-14 NOTE — Progress Notes (Signed)
Daily Session Note  Patient Details  Name: Toni Cooper MRN: [id number] Date of Birth: 1946/10/31 Referring Provider:     CARDIAC REHAB PHASE II ORIENTATION from 12/06/2017 in [place]  Referring Provider  Dr. Percival Spanish      Encounter Date: 02/14/2018  Check In: Session Check In - 02/14/18 1545      Check-In   Location  [facility]    Staff Present  Diane Angelina Pih, MS, EP, [facility], Exercise Physiologist;Conna Terada Wynetta Emery, RN, BSN    Supervising physician immediately available to respond to emergencies  See telemetry face sheet for immediately available MD    Medication changes reported      No    Fall or balance concerns reported     Yes    Comments  Patient has fallen in the past so she is afraid of falling.     Warm-up and Cool-down  Performed as group-led Higher education careers adviser Performed  Yes    VAD Patient?  No      Pain Assessment   Currently in Pain?  No/denies    Multiple Pain Sites  No       Capillary Blood Glucose: No results found for this or any previous visit (from the past 24 hour(s)).    Social History   Tobacco Use  Smoking Status Never Smoker  Smokeless Tobacco Never Used    Goals Met:  Independence with exercise equipment Exercise tolerated well No report of cardiac concerns or symptoms Strength training completed today  Goals Unmet:  Not Applicable  Comments: Check out 1645.   Dr. Kate Sable is Medical Director for [facility] Cardiac and Pulmonary Rehab.

## 2018-02-17 ENCOUNTER — Other Ambulatory Visit: Payer: Self-pay | Admitting: Nurse Practitioner

## 2018-02-17 ENCOUNTER — Encounter (HOSPITAL_COMMUNITY)
Admission: RE | Admit: 2018-02-17 | Discharge: 2018-02-17 | Disposition: A | Discharge status: Home / Self Care | Payer: Medicare Other | Source: Ambulatory Visit | Attending: Cardiology | Admitting: Cardiology

## 2018-02-17 DIAGNOSIS — Z953 Presence of xenogenic heart valve: Secondary | ICD-10-CM | POA: Insufficient documentation

## 2018-02-17 DIAGNOSIS — Z48812 Encounter for surgical aftercare following surgery on the circulatory system: Secondary | ICD-10-CM | POA: Insufficient documentation

## 2018-02-17 NOTE — Progress Notes (Signed)
Daily Session Note  Patient Details  Name: Toni Cooper MRN: [id number] Date of Birth: [birth year] Referring Provider:     CARDIAC REHAB PHASE II ORIENTATION from 12/06/2017 in [place]  Referring Provider  Dr. Percival Spanish      Encounter Date: 02/17/2018  Check In: Session Check In - 02/17/18 1545      Check-In   Location  [facility]    Staff Present  Aundra Dubin, RN, BSN;Gregory Luther Parody, BS, EP, Exercise Physiologist    Supervising physician immediately available to respond to emergencies  See telemetry face sheet for immediately available MD    Medication changes reported      No    Fall or balance concerns reported     Yes    Comments  Patient has fallen in the past so she is afraid of falling.     Warm-up and Cool-down  Performed as group-led Higher education careers adviser Performed  Yes    VAD Patient?  No      Pain Assessment   Currently in Pain?  No/denies    Multiple Pain Sites  No       Capillary Blood Glucose: No results found for this or any previous visit (from the past 24 hour(s)).    Social History   Tobacco Use  Smoking Status Never Smoker  Smokeless Tobacco Never Used    Goals Met:  Independence with exercise equipment Exercise tolerated well No report of cardiac concerns or symptoms Strength training completed today  Goals Unmet:  Not Applicable  Comments: Check out 1645.   Dr. Kate Sable is Medical Director for [facility] Cardiac and Pulmonary Rehab.

## 2018-02-19 ENCOUNTER — Encounter (HOSPITAL_COMMUNITY)
Admission: RE | Admit: 2018-02-19 | Discharge: 2018-02-19 | Disposition: A | Discharge status: Home / Self Care | Payer: Medicare Other | Source: Ambulatory Visit | Attending: Cardiology | Admitting: Cardiology

## 2018-02-19 DIAGNOSIS — Z953 Presence of xenogenic heart valve: Secondary | ICD-10-CM

## 2018-02-19 DIAGNOSIS — Z48812 Encounter for surgical aftercare following surgery on the circulatory system: Secondary | ICD-10-CM | POA: Diagnosis not present

## 2018-02-19 NOTE — Progress Notes (Signed)
Daily Session Note  Patient Details  Name: Toni Cooper MRN: [id number] Date of Birth: 06-01-1946 Referring Provider:     CARDIAC REHAB PHASE II ORIENTATION from 12/06/2017 in [place]  Referring Provider  Dr. Percival Spanish      Encounter Date: 02/19/2018  Check In: Session Check In - 02/19/18 1545      Check-In   Location  [facility]    Staff Present  Aundra Dubin, RN, BSN;Diane Coad, MS, EP, [facility], Exercise Physiologist    Supervising physician immediately available to respond to emergencies  See telemetry face sheet for immediately available MD    Medication changes reported      No    Fall or balance concerns reported     Yes    Comments  Patient has fallen in the past so she is afraid of falling.     Warm-up and Cool-down  Performed as group-led Higher education careers adviser Performed  Yes    VAD Patient?  No      Pain Assessment   Currently in Pain?  No/denies    Multiple Pain Sites  No       Capillary Blood Glucose: No results found for this or any previous visit (from the past 24 hour(s)).    Social History   Tobacco Use  Smoking Status Never Smoker  Smokeless Tobacco Never Used    Goals Met:  Independence with exercise equipment Exercise tolerated well No report of cardiac concerns or symptoms Strength training completed today  Goals Unmet:  Not Applicable  Comments: Check out 1645.   Dr. Kate Sable is Medical Director for [facility] Cardiac and Pulmonary Rehab.

## 2018-02-21 ENCOUNTER — Encounter (HOSPITAL_COMMUNITY)
Admission: RE | Admit: 2018-02-21 | Discharge: 2018-02-21 | Disposition: A | Discharge status: Home / Self Care | Payer: Medicare Other | Source: Ambulatory Visit | Attending: Cardiology | Admitting: Cardiology

## 2018-02-21 DIAGNOSIS — Z48812 Encounter for surgical aftercare following surgery on the circulatory system: Secondary | ICD-10-CM | POA: Diagnosis not present

## 2018-02-21 DIAGNOSIS — Z953 Presence of xenogenic heart valve: Secondary | ICD-10-CM

## 2018-02-21 NOTE — Progress Notes (Signed)
Daily Session Note  Patient Details  Name: Toni Cooper MRN: [id number] Date of Birth: 09/24/1946 Referring Provider:     CARDIAC REHAB PHASE II ORIENTATION from 12/06/2017 in [place]  Referring Provider  Dr. Percival Spanish      Encounter Date: 02/21/2018  Check In: Session Check In - 02/21/18 1545      Check-In   Location  [facility]    Staff Present  Aundra Dubin, RN, BSN;Gregory Luther Parody, BS, EP, Exercise Physiologist    Supervising physician immediately available to respond to emergencies  See telemetry face sheet for immediately available MD    Medication changes reported      No    Fall or balance concerns reported     Yes    Comments  Patient has fallen in the past so she is afraid of falling.     Warm-up and Cool-down  Performed as group-led Higher education careers adviser Performed  Yes    VAD Patient?  No      Pain Assessment   Currently in Pain?  No/denies    Multiple Pain Sites  No       Capillary Blood Glucose: No results found for this or any previous visit (from the past 24 hour(s)).    Social History   Tobacco Use  Smoking Status Never Smoker  Smokeless Tobacco Never Used    Goals Met:  Independence with exercise equipment Exercise tolerated well No report of cardiac concerns or symptoms Strength training completed today  Goals Unmet:  Not Applicable  Comments: Check out 1645.   Dr. Kate Sable is Medical Director for [facility] Cardiac and Pulmonary Rehab.

## 2018-02-23 NOTE — Progress Notes (Signed)
Cardiology Office Note   Date:  02/25/2018   ID:  Toni Cooper, DOB 1945-12-22, MRN [id number]  PCP:  [name], FNP  Cardiologist:   No primary care provider on file.   Chief Complaint  Patient presents with  . AVR      History of Present Illness: Toni Cooper is a 72 y.o. female who presents for follow up after SAVR.   She has done well.  The patient denies any new symptoms such as chest discomfort, neck or arm discomfort. There has been no new shortness of breath, PND or orthopnea. There have been no reported palpitations, presyncope or syncope.  She is finishing rehab.  Of note she did get slightly lightheaded on a higher dose of beta-blocker and this was reduced.   Past Medical History:  Diagnosis Date  . Aortic stenosis, severe 2015  . Cataract   . Diabetes mellitus without complication ([place])   . Dyspnea   . Hypertension   . Obesity   . Osteoporosis   . S/P minimally invasive aortic valve replacement with bioprosthetic valve 10/16/2017   23 mm Edwards Intuity Elite rapid deployment bovine stented bioprosthetic tissue valve via partial upper sternotomy    Past Surgical History:  Procedure Laterality Date  . ABDOMINAL HYSTERECTOMY    . APPENDECTOMY    . FRACTURE SURGERY     shoulder  . TEE WITHOUT CARDIOVERSION N/A 04/18/2016   Procedure: TRANSESOPHAGEAL ECHOCARDIOGRAM (TEE);  Surgeon: Thayer Headings, MD;  Location: [place];  Service: Cardiovascular;  Laterality: N/A;  . TEE WITHOUT CARDIOVERSION N/A 10/16/2017   Procedure: TRANSESOPHAGEAL ECHOCARDIOGRAM (TEE);  Surgeon: Rexene Alberts, MD;  Location: [place];  Service: Open Heart Surgery;  Laterality: N/A;  . WRIST SURGERY       Current Outpatient Medications  Medication Sig Dispense Refill  . acetaminophen (TYLENOL) 500 MG tablet Take 500 mg 2 (two) times daily as needed by mouth for moderate pain or headache.     . alendronate (FOSAMAX) 70 MG tablet TAKE 1 TABLET BY MOUTH  EVERY 7 DAYS WITH A  FULL  GLASS OF WATER ON AN EMPTY  STOMACH 12 tablet 3  . Ascorbic Acid (VITAMIN C) 1000 MG tablet Take 1,000 mg daily by mouth.    [name] aspirin 81 MG EC tablet Take 1 tablet (81 mg total) by mouth daily.    [name] atorvastatin (LIPITOR) 10 MG tablet Take 1 tablet (10 mg total) by mouth daily at 6 PM. 30 tablet 1  . beta carotene w/minerals (OCUVITE) tablet Take 1 tablet by mouth daily.    . calcium carbonate (TUMS - DOSED IN MG ELEMENTAL CALCIUM) 500 MG chewable tablet Chew 1 tablet by mouth daily as needed for indigestion or heartburn.    . calcium-vitamin D (OSCAL 500/200 D-3) 500-200 MG-UNIT tablet Take 1 tablet by mouth 2 (two) times daily.    . cholecalciferol (VITAMIN D) 1000 UNITS tablet Take 1,000 Units by mouth daily.    . fluticasone (FLONASE) 50 MCG/ACT nasal spray Place 1 spray into both nostrils daily as needed for allergies or rhinitis.    [name] HUMALOG 100 UNIT/ML injection Inject 6-20 Units into the skin 3 (three) times daily with meals. Using sliding scale.    . hydrochlorothiazide (HYDRODIURIL) 25 MG tablet Take 1 tablet (25 mg total) daily by mouth. 90 tablet 1  . LANTUS 100 UNIT/ML injection Inject 35 Units into the skin at bedtime.     [name] lisinopril (PRINIVIL,ZESTRIL) 20 MG  tablet Take 1 tablet (20 mg total) by mouth daily. 90 tablet 3  . Magnesium 250 MG TABS Take 250 mg by mouth daily.     . metFORMIN (GLUCOPHAGE-XR) 500 MG 24 hr tablet Take 1 tablet (500 mg total) 2 (two) times daily by mouth. 180 tablet 1  . metoprolol tartrate (LOPRESSOR) 12.5 mg TABS tablet Take 12.5 mg by mouth 2 (two) times daily.    . Multiple Vitamins-Minerals (MULTI-BETIC DIABETES) TABS Take 1 tablet daily by mouth.    . pyridOXINE (VITAMIN B-6) 100 MG tablet Take 100 mg by mouth daily.    [name] TEST test strip Test four times daily.     No current facility-administered medications for this visit.     Allergies:   Sulfa antibiotics    ROS:  Please see the history of present illness.   Otherwise,  review of systems are positive for none.   All other systems are reviewed and negative.    PHYSICAL EXAM: VS:  BP 140/72   Pulse 73   Ht 5\' 4"  (1.626 m)   Wt 211 lb 6.4 oz (95.9 kg)   SpO2 97%   BMI 36.29 kg/m  , BMI Body mass index is 36.29 kg/m.  GENERAL:  Well appearing NECK:  No jugular venous distention, waveform within normal limits, carotid upstroke brisk and symmetric, no bruits, no thyromegaly LUNGS:  Clear to auscultation bilaterally CHEST:  Well healed sternotomy scar. HEART:  PMI not displaced or sustained,S1 and S2 within normal limits, no S3, no S4, no clicks, no rubs, soft apical systolic murmur, no diastolic murmurs ABD:  Flat, positive bowel sounds normal in frequency in pitch, no bruits, no rebound, no guarding, no midline pulsatile mass, no hepatomegaly, no splenomegaly EXT:  2 plus pulses throughout, no edema, no cyanosis no clubbing   EKG:  EKG is not ordered today.    Recent Labs: 10/14/2017: ALT 18 10/17/2017: Magnesium 2.4 10/19/2017: BUN 21; Creatinine, Ser 0.89; Hemoglobin 10.3; Platelets 148; Potassium 4.0; Sodium 138    Lipid Panel    Component Value Date/Time   CHOL 98 (L) 04/04/2017 0830   TRIG 54 04/04/2017 0830   TRIG 74 02/25/2014   HDL 38 (L) 04/04/2017 0830   CHOLHDL 2.6 04/04/2017 0830   LDLCALC 49 04/04/2017 0830     Lab Results  Component Value Date   HGBA1C 6.9 (H) 10/14/2017     Wt Readings from Last 3 Encounters:  02/25/18 211 lb 6.4 oz (95.9 kg)  01/13/18 204 lb (92.5 kg)  12/06/17 206 lb (93.4 kg)      Other studies Reviewed: Additional studies/ records that were reviewed today include: None. Review of the above records demonstrates:  Please see elsewhere in the note.     ASSESSMENT AND PLAN:  AORTIC VR:      She is doing well with respect to this.  No change in therapy.   OVERWEIGHT:    The patient understands the need to lose weight with diet and exercise. We have discussed specific strategies for  this.  DM:  A1C was 6.9.  This is managed by [name], FNP  HTN:    The blood pressure is at target. No change in medications is indicated. We will continue with therapeutic lifestyle changes (TLC).   Current medicines are reviewed at length with the patient today.  The patient does not have concerns regarding medicines.  The following changes have been made:  no change  Labs/ tests ordered today include:  None No orders of the defined types were placed in this encounter.    Disposition:   FU with me in six months.     Signed, [name], MD  02/25/2018 5:26 PM    [facility]

## 2018-02-24 ENCOUNTER — Encounter (HOSPITAL_COMMUNITY)
Admission: RE | Admit: 2018-02-24 | Discharge: 2018-02-24 | Disposition: A | Discharge status: Home / Self Care | Payer: Medicare Other | Source: Ambulatory Visit | Attending: Cardiology | Admitting: Cardiology

## 2018-02-24 DIAGNOSIS — Z953 Presence of xenogenic heart valve: Secondary | ICD-10-CM

## 2018-02-24 DIAGNOSIS — Z48812 Encounter for surgical aftercare following surgery on the circulatory system: Secondary | ICD-10-CM | POA: Diagnosis not present

## 2018-02-24 NOTE — Progress Notes (Signed)
Daily Session Note  Patient Details  Name: Toni Cooper MRN: [id number] Date of Birth: November 14, 1946 Referring Provider:     CARDIAC REHAB PHASE II ORIENTATION from 12/06/2017 in [place]  Referring Provider  Dr. Percival Spanish      Encounter Date: 02/24/2018  Check In: Session Check In - 02/24/18 1546      Check-In   Location  [facility]    Staff Present  Aundra Dubin, RN, BSN;[place] Luther Parody, BS, EP, Exercise Physiologist    Supervising physician immediately available to respond to emergencies  See telemetry face sheet for immediately available MD    Medication changes reported      No    Fall or balance concerns reported     Yes    Comments  Patient has fallen in the past so she is afraid of falling.     Warm-up and Cool-down  Performed as group-led Higher education careers adviser Performed  Yes    VAD Patient?  No      Pain Assessment   Currently in Pain?  No/denies    Multiple Pain Sites  No       Capillary Blood Glucose: No results found for this or any previous visit (from the past 24 hour(s)).    Social History   Tobacco Use  Smoking Status Never Smoker  Smokeless Tobacco Never Used    Goals Met:  Independence with exercise equipment Exercise tolerated well No report of cardiac concerns or symptoms Strength training completed today  Goals Unmet:  Not Applicable  Comments: Check out 445   Dr. Kate Sable is Medical Director for [place] and Pulmonary Rehab.

## 2018-02-25 ENCOUNTER — Ambulatory Visit: Payer: Medicare Other | Admitting: Cardiology

## 2018-02-25 ENCOUNTER — Encounter: Payer: Self-pay | Admitting: Cardiology

## 2018-02-25 VITALS — BP 140/72 | HR 73 | Ht 64.0 in | Wt 211.4 lb

## 2018-02-25 DIAGNOSIS — Z952 Presence of prosthetic heart valve: Secondary | ICD-10-CM

## 2018-02-25 DIAGNOSIS — I10 Essential (primary) hypertension: Secondary | ICD-10-CM

## 2018-02-25 NOTE — Patient Instructions (Signed)

## 2018-02-26 ENCOUNTER — Encounter (HOSPITAL_COMMUNITY)
Admission: RE | Admit: 2018-02-26 | Discharge: 2018-02-26 | Disposition: A | Discharge status: Home / Self Care | Payer: Medicare Other | Source: Ambulatory Visit | Attending: Cardiology | Admitting: Cardiology

## 2018-02-26 DIAGNOSIS — Z48812 Encounter for surgical aftercare following surgery on the circulatory system: Secondary | ICD-10-CM | POA: Diagnosis not present

## 2018-02-26 DIAGNOSIS — Z953 Presence of xenogenic heart valve: Secondary | ICD-10-CM

## 2018-02-26 NOTE — Progress Notes (Signed)
Daily Session Note  Patient Details  Name: Toni Cooper MRN: [id number] Date of Birth: 19-Oct-1946 Referring Provider:     CARDIAC REHAB PHASE II ORIENTATION from 12/06/2017 in [place]  Referring Provider  Dr. Percival Spanish      Encounter Date: 02/26/2018  Check In: Session Check In - 02/26/18 1546      Check-In   Location  [facility]    Staff Present  Aundra Dubin, RN, BSN;Latresa Gasser Luther Parody, BS, EP, Exercise Physiologist;Diane Coad, MS, EP, [facility], Exercise Physiologist    Supervising physician immediately available to respond to emergencies  See telemetry face sheet for immediately available MD    Medication changes reported      No    Fall or balance concerns reported     Yes    Comments  Patient has fallen in the past so she is afraid of falling.     Warm-up and Cool-down  Performed as group-led Higher education careers adviser Performed  Yes    VAD Patient?  No      Pain Assessment   Currently in Pain?  No/denies    Multiple Pain Sites  No       Capillary Blood Glucose: No results found for this or any previous visit (from the past 24 hour(s)).    Social History   Tobacco Use  Smoking Status Never Smoker  Smokeless Tobacco Never Used    Goals Met:  Independence with exercise equipment Exercise tolerated well No report of cardiac concerns or symptoms Strength training completed today  Goals Unmet:  Not Applicable  Comments: Check out 445   Dr. Kate Sable is Medical Director for [place] and Pulmonary Rehab.

## 2018-02-28 ENCOUNTER — Encounter (HOSPITAL_COMMUNITY)
Admission: RE | Admit: 2018-02-28 | Discharge: 2018-02-28 | Disposition: A | Discharge status: Home / Self Care | Payer: Medicare Other | Source: Ambulatory Visit | Attending: Cardiology | Admitting: Cardiology

## 2018-02-28 DIAGNOSIS — Z48812 Encounter for surgical aftercare following surgery on the circulatory system: Secondary | ICD-10-CM | POA: Diagnosis not present

## 2018-02-28 DIAGNOSIS — Z953 Presence of xenogenic heart valve: Secondary | ICD-10-CM

## 2018-02-28 NOTE — Progress Notes (Signed)
Daily Session Note  Patient Details  Name: Toni Cooper MRN: [id number] Date of Birth: 09/27/1946 Referring Provider:     [place] from 12/06/2017 in [place]  Referring Provider  Dr. Percival Spanish      Encounter Date: 02/28/2018  Check In: Session Check In - 02/28/18 1546      Check-In   Location  [facility]    Staff Present  Aundra Dubin, RN, BSN;Madaline Lefeber Luther Parody, BS, EP, Exercise Physiologist    Supervising physician immediately available to respond to emergencies  See telemetry face sheet for immediately available MD    Medication changes reported      No    Fall or balance concerns reported     Yes    Comments  Patient has fallen in the past so she is afraid of falling.     Warm-up and Cool-down  Performed as group-led Higher education careers adviser Performed  Yes    VAD Patient?  No      Pain Assessment   Currently in Pain?  No/denies    Multiple Pain Sites  No       Capillary Blood Glucose: No results found for this or any previous visit (from the past 24 hour(s)).    Social History   Tobacco Use  Smoking Status Never Smoker  Smokeless Tobacco Never Used    Goals Met:  Independence with exercise equipment Exercise tolerated well No report of cardiac concerns or symptoms Strength training completed today  Goals Unmet:  Not Applicable  Comments: Check out 445   Dr. Kate Sable is Medical Director for [place] and Pulmonary Rehab.

## 2018-03-03 ENCOUNTER — Encounter (HOSPITAL_COMMUNITY)
Admission: RE | Admit: 2018-03-03 | Discharge: 2018-03-03 | Disposition: A | Discharge status: Home / Self Care | Payer: Medicare Other | Source: Ambulatory Visit | Attending: Cardiology | Admitting: Cardiology

## 2018-03-03 VITALS — Ht 64.0 in | Wt 213.0 lb

## 2018-03-03 DIAGNOSIS — Z953 Presence of xenogenic heart valve: Secondary | ICD-10-CM

## 2018-03-03 DIAGNOSIS — Z48812 Encounter for surgical aftercare following surgery on the circulatory system: Secondary | ICD-10-CM | POA: Diagnosis not present

## 2018-03-03 NOTE — Progress Notes (Signed)
Daily Session Note  Patient Details  Name: Toni Cooper MRN: [id number] Date of Birth: 12/07/1945 Referring Provider:     CARDIAC REHAB PHASE II ORIENTATION from 12/06/2017 in [place]  Referring Provider  Dr. Percival Spanish      Encounter Date: 03/03/2018  Check In: Session Check In - 03/03/18 1541      Check-In   Location  [facility]    Staff Present  Aundra Dubin, RN, BSN;Lundon Verdejo Luther Parody, BS, EP, Exercise Physiologist    Supervising physician immediately available to respond to emergencies  See telemetry face sheet for immediately available MD    Medication changes reported      No    Fall or balance concerns reported     Yes    Comments  Patient has fallen in the past so she is afraid of falling.     Warm-up and Cool-down  Performed as group-led Higher education careers adviser Performed  Yes    VAD Patient?  No      Pain Assessment   Currently in Pain?  No/denies    Multiple Pain Sites  No       Capillary Blood Glucose: No results found for this or any previous visit (from the past 24 hour(s)).    Social History   Tobacco Use  Smoking Status Never Smoker  Smokeless Tobacco Never Used    Goals Met:  Independence with exercise equipment Exercise tolerated well No report of cardiac concerns or symptoms Strength training completed today  Goals Unmet:  Not Applicable  Comments: Check out 445   Dr. Kate Sable is Medical Director for [facility].

## 2018-03-05 NOTE — Progress Notes (Signed)
Cardiac Individual Treatment Plan  Patient Details  Name: Toni Cooper MRN: [id number] Date of Birth: Jan 01, 1946 Referring Provider:     CARDIAC REHAB [place] from 12/06/2017 in [place]  Referring Provider  Dr. Percival Spanish      Initial Encounter Date:    CARDIAC REHAB PHASE II ORIENTATION from 12/06/2017 in [place]  Date  12/06/17  Referring Provider  Dr. Percival Spanish      Visit Diagnosis: S/P aortic valve replacement with bioprosthetic valve  Patient's Home Medications on Admission:  Current Outpatient Medications:  .  acetaminophen (TYLENOL) 500 MG tablet, Take 500 mg 2 (two) times daily as needed by mouth for moderate pain or headache. , Disp: , Rfl:  .  alendronate (FOSAMAX) 70 MG tablet, TAKE 1 TABLET BY MOUTH  EVERY 7 DAYS WITH A FULL  GLASS OF WATER ON AN EMPTY  STOMACH, Disp: 12 tablet, Rfl: 3 .  Ascorbic Acid (VITAMIN C) 1000 MG tablet, Take 1,000 mg daily by mouth., Disp: , Rfl:  .  aspirin 81 MG EC tablet, Take 1 tablet (81 mg total) by mouth daily., Disp: , Rfl:  .  atorvastatin (LIPITOR) 10 MG tablet, Take 1 tablet (10 mg total) by mouth daily at 6 PM., Disp: 30 tablet, Rfl: 1 .  beta carotene w/minerals (OCUVITE) tablet, Take 1 tablet by mouth daily., Disp: , Rfl:  .  calcium carbonate (TUMS - DOSED IN MG ELEMENTAL CALCIUM) 500 MG chewable tablet, Chew 1 tablet by mouth daily as needed for indigestion or heartburn., Disp: , Rfl:  .  calcium-vitamin D (OSCAL 500/200 D-3) 500-200 MG-UNIT tablet, Take 1 tablet by mouth 2 (two) times daily., Disp: , Rfl:  .  cholecalciferol (VITAMIN D) 1000 UNITS tablet, Take 1,000 Units by mouth daily., Disp: , Rfl:  .  fluticasone (FLONASE) 50 MCG/ACT nasal spray, Place 1 spray into both nostrils daily as needed for allergies or rhinitis., Disp: , Rfl:  .  HUMALOG 100 UNIT/ML injection, Inject 6-20 Units into the skin 3 (three) times daily with meals. Using sliding scale., Disp: , Rfl:   .  hydrochlorothiazide (HYDRODIURIL) 25 MG tablet, Take 1 tablet (25 mg total) daily by mouth., Disp: 90 tablet, Rfl: 1 .  LANTUS 100 UNIT/ML injection, Inject 35 Units into the skin at bedtime. , Disp: , Rfl:  .  lisinopril (PRINIVIL,ZESTRIL) 20 MG tablet, Take 1 tablet (20 mg total) by mouth daily., Disp: 90 tablet, Rfl: 3 .  Magnesium 250 MG TABS, Take 250 mg by mouth daily. , Disp: , Rfl:  .  metFORMIN (GLUCOPHAGE-XR) 500 MG 24 hr tablet, Take 1 tablet (500 mg total) 2 (two) times daily by mouth., Disp: 180 tablet, Rfl: 1 .  metoprolol tartrate (LOPRESSOR) 12.5 mg TABS tablet, Take 12.5 mg by mouth 2 (two) times daily., Disp: , Rfl:  .  Multiple Vitamins-Minerals (MULTI-BETIC DIABETES) TABS, Take 1 tablet daily by mouth., Disp: , Rfl:  .  pyridOXINE (VITAMIN B-6) 100 MG tablet, Take 100 mg by mouth daily., Disp: , Rfl:  .  TRUETRACK TEST test strip, Test four times daily., Disp: , Rfl:   Past Medical History: Past Medical History:  Diagnosis Date  . Aortic stenosis, severe 2015  . Cataract   . Diabetes mellitus without complication ([place])   . Dyspnea   . Hypertension   . Obesity   . Osteoporosis   . S/P minimally invasive aortic valve replacement with bioprosthetic valve 10/16/2017   23 mm Oletta Lamas  Intuity Elite rapid deployment bovine stented bioprosthetic tissue valve via partial upper sternotomy    Tobacco Use: Social History   Tobacco Use  Smoking Status Never Smoker  Smokeless Tobacco Never Used    Labs: Recent Review Flowsheet Data    Labs for ITP Cardiac and Pulmonary Rehab Latest Ref Rng & Units 10/16/2017 10/16/2017 10/16/2017 10/16/2017 10/17/2017   Cholestrol 100 - 199 mg/dL - - - - -   LDLCALC 0 - 99 mg/dL - - - - -   HDL >39 mg/dL - - - - -   Trlycerides 0 - 149 mg/dL - - - - -   Hemoglobin A1c 4.8 - 5.6 % - - - - -   PHART 7.350 - 7.450 7.334(L) 7.323(L) - 7.313(L) 7.360   PCO2ART 32.0 - 48.0 mmHg 43.1 44.9 - 44.4 44.6   HCO3 20.0 - 28.0 mmol/L 23.1 23.4 -  22.6 24.6   TCO2 22 - 32 mmol/L '24 25 24 24 24   '$ ACIDBASEDEF 0.0 - 2.0 mmol/L 3.0(H) 3.0(H) - 4.0(H) 0.2   O2SAT % 98.0 96.0 - 94.0 94.4      Capillary Blood Glucose: Lab Results  Component Value Date   GLUCAP 223 (H) 10/21/2017   GLUCAP 175 (H) 10/21/2017   GLUCAP 208 (H) 10/20/2017   GLUCAP 149 (H) 10/20/2017   GLUCAP 223 (H) 10/20/2017     Exercise Target Goals:    Exercise Program Goal: Individual exercise prescription set using results from initial 6 min walk test and THRR while considering  patient's activity barriers and safety.   Exercise Prescription Goal: Starting with aerobic activity 30 plus minutes a day, 3 days per week for initial exercise prescription. Provide home exercise prescription and guidelines that participant acknowledges understanding prior to discharge.  Activity Barriers & Risk Stratification: Activity Barriers & Cardiac Risk Stratification - 12/06/17 1412      Activity Barriers & Cardiac Risk Stratification   Activity Barriers  History of Falls    Cardiac Risk Stratification  High       6 Minute Walk: 6 Minute Walk    Row Name 12/06/17 1418 03/04/18 0756       6 Minute Walk   Phase  Initial  Discharge    Distance  1000 feet  1200 feet    Distance % Change  0 %  20 %    Distance Feet Change  0 ft  200 ft    Walk Time  6 minutes  6 minutes    # of Rest Breaks  0  0    MPH  1.89  2.27    METS  2.45  2.74    RPE  12  12    Perceived Dyspnea   9  11    VO2 Peak  6.81  8.62    Symptoms  No  No    Resting HR  68 bpm  87 bpm    Resting BP  120/60  130/60    Resting Oxygen Saturation   98 %  90 %    Exercise Oxygen Saturation  during 6 min walk  95 %  85 %    Max Ex. HR  102 bpm  117 bpm    Max Ex. BP  148/70  156/70    2 Minute Post BP  120/66  132/66       Oxygen Initial Assessment:   Oxygen Re-Evaluation:   Oxygen Discharge (Final Oxygen Re-Evaluation):   Initial Exercise Prescription: Initial Exercise  Prescription -  12/06/17 1400      Date of Initial Exercise RX and Referring Provider   Date  12/06/17    Referring Provider  Dr. Percival Spanish      Treadmill   MPH  1    Grade  0    Minutes  15    METs  1.7      NuStep   Level  2    SPM  54    Minutes  20    METs  1.6      Prescription Details   Frequency (times per week)  3    Duration  Progress to 30 minutes of continuous aerobic without signs/symptoms of physical distress      Intensity   THRR 40-80% of Max Heartrate  [id number]    Ratings of Perceived Exertion  11-13    Perceived Dyspnea  0-4      Progression   Progression  Continue progressive overload as per policy without signs/symptoms or physical distress.      Resistance Training   Training Prescription  Yes    Weight  1    Reps  10-15       Perform Capillary Blood Glucose checks as needed.  Exercise Prescription Changes:  Exercise Prescription Changes    Row Name 12/12/17 0700 12/17/17 0700 01/01/18 1400 01/16/18 1200 02/04/18 0800     Response to Exercise   Blood Pressure (Admit)  -  144/62  130/70  130/60  140/60   Blood Pressure (Exercise)  -  146/60  130/70  142/60  150/72   Blood Pressure (Exit)  -  142/62  142/62  130/60  128/60   Heart Rate (Admit)  -  69 bpm  76 bpm  61 bpm  70 bpm   Heart Rate (Exercise)  -  109 bpm  98 bpm  97 bpm  105 bpm   Heart Rate (Exit)  -  78 bpm  81 bpm  77 bpm  79 bpm   Rating of Perceived Exertion (Exercise)  -  '13  13  12  12   '$ Duration  -  Progress to 30 minutes of  aerobic without signs/symptoms of physical distress  Progress to 30 minutes of  aerobic without signs/symptoms of physical distress  Progress to 30 minutes of  aerobic without signs/symptoms of physical distress  Progress to 30 minutes of  aerobic without signs/symptoms of physical distress   Intensity  -  THRR New [id number]  THRR New 101-119-134  THRR New [phone]  THRR New 101-117-132     Progression   Progression  -  Continue to progress workloads to maintain  intensity without signs/symptoms of physical distress.  Continue to progress workloads to maintain intensity without signs/symptoms of physical distress.  Continue to progress workloads to maintain intensity without signs/symptoms of physical distress.  Continue to progress workloads to maintain intensity without signs/symptoms of physical distress.     Resistance Training   Training Prescription  Yes  Yes  Yes  Yes  Yes   Weight  '1  1  2  2  2   '$ Reps  10-15  10-15  10-15  10-15  10-15     Treadmill   MPH  1  1.2  1.3  1.4  1.6   Grade  0  0  0  0  0   Minutes  '15  15  15  15  15   '$ METs  1.7  1.9  1.9  2  2.2     NuStep   Level  '2  2  2  2  2   '$ SPM  54  75  78  83  86   Minutes  '20  20  20  20  20   '$ METs  1.6  1.9  1.9  1.9  2     Home Exercise Plan   Plans to continue exercise at  Home (comment)  Home (comment)  Home (comment)  Home (comment)  Home (comment)   Frequency  Add 2 additional days to program exercise sessions.  Add 2 additional days to program exercise sessions.  Add 2 additional days to program exercise sessions.  Add 2 additional days to program exercise sessions.  Add 2 additional days to program exercise sessions.   Initial Home Exercises Provided  12/11/17  12/11/17  12/11/17  12/11/17  12/11/17   Row Name 02/12/18 1400             Response to Exercise   Blood Pressure (Admit)  140/60       Blood Pressure (Exercise)  140/60       Blood Pressure (Exit)  120/60       Heart Rate (Admit)  69 bpm       Heart Rate (Exercise)  90 bpm       Heart Rate (Exit)  77 bpm       Rating of Perceived Exertion (Exercise)  12       Duration  Progress to 30 minutes of  aerobic without signs/symptoms of physical distress       Intensity  THRR unchanged 101-117-132         Progression   Progression  Continue to progress workloads to maintain intensity without signs/symptoms of physical distress.         Resistance Training   Training Prescription  Yes       Weight  2        Reps  10-15         Treadmill   MPH  1.7       Grade  0       Minutes  15       METs  2.3         NuStep   Level  2       SPM  90       Minutes  20       METs  2         Home Exercise Plan   Plans to continue exercise at  Home (comment)       Frequency  Add 2 additional days to program exercise sessions.       Initial Home Exercises Provided  12/11/17          Exercise Comments:  Exercise Comments    Row Name 12/12/17 0746 12/17/17 0742 01/01/18 1426 01/01/18 1427 01/16/18 1251   Exercise Comments  Patient recieved the take home exercise plan today 12/11/2017. THR was addressed as were safety guidelines for being active when not in CR. Patient demonstrated an understanding and was encouraged to ask any future questions as they arise.   Patient is doing well in CR. She has increased her speed on the treadmill aswell as her SPMs on the Nustep. She has only just started and more progressions will appear over time,.   -  Patient is doing well in CR and has maintained both her nustep level and her  SPMs. Patient has increased her speed on the treadmill as well as her weight for the warm up resistance. Patient can not tell a differnece yet from the program however it has still only been a total of 10 sessions.   Patient continues to do well in CR and has increased her speed on the treadmill and has maintained her level and SPMs on the Nustep machine. Patient has also decreased her level on the RPE scale showing that she is feeling more confortable on the equipment that before. Patient will continued to be progressed and monitored throughout the program.   [place] Name 02/04/18 0803 02/12/18 1422         Exercise Comments  Patient is doing well in CR and has progressed her speed on the treadmill from 1.4 to 1.6 and has also increased her SPMs on the nustep machine. Patient has gained stamina from the programed and will continue to be monitored for further results.   Patient is doing well in CR and  has increased her speed on the treadmill from 1.6 to 1.7. Patient has also increased her SPMs on the nustep while maintaining her level as well. Patient states that she is feeling stronger and is able to do more when not in Cr.          Exercise Goals and Review:  Exercise Goals    Row Name 12/06/17 1420             Exercise Goals   Increase Physical Activity  Yes       Intervention  Provide advice, education, support and counseling about physical activity/exercise needs.;Develop an individualized exercise prescription for aerobic and resistive training based on initial evaluation findings, risk stratification, comorbidities and participant's personal goals.       Expected Outcomes  Short Term: Attend rehab on a regular basis to increase amount of physical activity.       Increase Strength and Stamina  Yes       Intervention  Develop an individualized exercise prescription for aerobic and resistive training based on initial evaluation findings, risk stratification, comorbidities and participant's personal goals.;Provide advice, education, support and counseling about physical activity/exercise needs.       Expected Outcomes  Short Term: Increase workloads from initial exercise prescription for resistance, speed, and METs.       Able to understand and use rate of perceived exertion (RPE) scale  Yes       Intervention  Provide education and explanation on how to use RPE scale       Expected Outcomes  Short Term: Able to use RPE daily in rehab to express subjective intensity level;Long Term:  Able to use RPE to guide intensity level when exercising independently       Able to understand and use Dyspnea scale  Yes       Intervention  Provide education and explanation on how to use Dyspnea scale       Expected Outcomes  Short Term: Able to use Dyspnea scale daily in rehab to express subjective sense of shortness of breath during exertion;Long Term: Able to use Dyspnea scale to guide intensity  level when exercising independently       Knowledge and understanding of Target Heart Rate Range (THRR)  Yes       Intervention  Provide education and explanation of THRR including how the numbers were predicted and where they are located for reference       Expected Outcomes  Short Term:  Able to state/look up THRR       Able to check pulse independently  Yes       Intervention  Provide education and demonstration on how to check pulse in carotid and radial arteries.;Review the importance of being able to check your own pulse for safety during independent exercise       Expected Outcomes  Short Term: Able to explain why pulse checking is important during independent exercise;Long Term: Able to check pulse independently and accurately       Understanding of Exercise Prescription  Yes       Intervention  Provide education, explanation, and written materials on patient's individual exercise prescription       Expected Outcomes  Short Term: Able to explain program exercise prescription;Long Term: Able to explain home exercise prescription to exercise independently          Exercise Goals Re-Evaluation : Exercise Goals Re-Evaluation    Row Name 01/01/18 1425 01/16/18 1249 02/12/18 1420         Exercise Goal Re-Evaluation   Exercise Goals Review  Increase Physical Activity;Increase Strength and Stamina;Knowledge and understanding of Target Heart Rate Range (THRR)  Increase Physical Activity;Increase Strength and Stamina;Knowledge and understanding of Target Heart Rate Range (THRR)  Increase Physical Activity;Increase Strength and Stamina;Knowledge and understanding of Target Heart Rate Range (THRR)     Comments  Patient is doing well in CR and has maintained both her nustep level and her SPMs. Patient has increased her speed on the treadmill as well as her weight for the warm up resistance. Patient can not tell a differnece yet from the program however it has still only been a total of 10 sessions.    Patient continues to do well in CR and has increased her speed on the treadmill and has maintained her level and SPMs on the Nustep machine. Patient has also decreased her level on the RPE scale showing that she is feeling more confortable on the equipment that before. Patient will continued to be progressed and monitored throughout the program.   Patient is doing well in CR and has increased her speed on the treadmill from 1.6 to 1.7. Patient has also increased her SPMs on the nustep while maintaining her level as well. Patient states that she is feeling stronger and is able to do more when not in Cr.      Expected Outcomes  Patient wishes to lose weight and to be able to domore activities out doors.   Patient wishes to lose weight and to be able to domore activities out doors.   Patient wishes to lose weight and to be able to domore activities out doors.          Discharge Exercise Prescription (Final Exercise Prescription Changes): Exercise Prescription Changes - 02/12/18 1400      Response to Exercise   Blood Pressure (Admit)  140/60    Blood Pressure (Exercise)  140/60    Blood Pressure (Exit)  120/60    Heart Rate (Admit)  69 bpm    Heart Rate (Exercise)  90 bpm    Heart Rate (Exit)  77 bpm    Rating of Perceived Exertion (Exercise)  12    Duration  Progress to 30 minutes of  aerobic without signs/symptoms of physical distress    Intensity  THRR unchanged 101-117-132      Progression   Progression  Continue to progress workloads to maintain intensity without signs/symptoms of physical distress.  Resistance Training   Training Prescription  Yes    Weight  2    Reps  10-15      Treadmill   MPH  1.7    Grade  0    Minutes  15    METs  2.3      NuStep   Level  2    SPM  90    Minutes  20    METs  2      Home Exercise Plan   Plans to continue exercise at  Home (comment)    Frequency  Add 2 additional days to program exercise sessions.    Initial Home Exercises Provided   12/11/17       Nutrition:  Target Goals: Understanding of nutrition guidelines, daily intake of sodium '1500mg'$ , cholesterol '200mg'$ , calories 30% from fat and 7% or less from saturated fats, daily to have 5 or more servings of fruits and vegetables.  Biometrics: Pre Biometrics - 12/06/17 1420      Pre Biometrics   Height  '5\' 4"'$  (1.626 m)    Weight  206 lb (93.4 kg)    Waist Circumference  47 inches    Hip Circumference  47.5 inches    Waist to Hip Ratio  0.99 %    BMI (Calculated)  35.34    Triceps Skinfold  22 mm    % Body Fat  46.4 %    Grip Strength  38.8 kg    Flexibility  0 in    Single Leg Stand  0 seconds      Post Biometrics - 03/04/18 0757       Post  Biometrics   Height  '5\' 4"'$  (1.626 m)    Weight  213 lb 0.5 oz (96.6 kg)    Waist Circumference  47 inches    Hip Circumference  46.5 inches    Waist to Hip Ratio  1.01 %    BMI (Calculated)  36.55    Triceps Skinfold  23 mm    % Body Fat  47.3 %    Grip Strength  38.73 kg    Flexibility  0 in    Single Leg Stand  0 seconds       Nutrition Therapy Plan and Nutrition Goals: Nutrition Therapy & Goals - 02/17/18 1411      Nutrition Therapy   RD appointment deferred  Yes      Personal Nutrition Goals   Personal Goal #2  She eats heart healthy, low carb, low sugar.     Additional Goals?  No       Nutrition Assessments: Nutrition Assessments - 03/05/18 1303      MEDFICTS Scores   Pre Score  18    Post Score  3    Score Difference  -15       Nutrition Goals Re-Evaluation:   Nutrition Goals Discharge (Final Nutrition Goals Re-Evaluation):   Psychosocial: Target Goals: Acknowledge presence or absence of significant depression and/or stress, maximize coping skills, provide positive support system. Participant is able to verbalize types and ability to use techniques and skills needed for reducing stress and depression.  Initial Review & Psychosocial Screening: Initial Psych Review & Screening -  12/06/17 1448      Initial Review   Current issues with  None Identified      Family Dynamics   Good Support System?  Yes      Barriers   Psychosocial barriers to participate in program  There  are no identifiable barriers or psychosocial needs.      Screening Interventions   Interventions  Encouraged to exercise    Expected Outcomes  Short Term goal: Identification and review with participant of any Quality of Life or Depression concerns found by scoring the questionnaire.;Long Term goal: The participant improves quality of Life and PHQ9 Scores as seen by post scores and/or verbalization of changes       Quality of Life Scores: Quality of Life - 03/04/18 0758      Quality of Life Scores   Health/Function Pre  26.58 %    Health/Function Post  27.6 %    Health/Function % Change  3.84 %    Socioeconomic Pre  29.58 %    Socioeconomic Post  30 %    Socioeconomic % Change   1.42 %    Psych/Spiritual Pre  27.43 %    Psych/Spiritual Post  28.29 %    Psych/Spiritual % Change  3.14 %    Family Pre  30 %    Family Post  26.4 %    Family % Change  -12 %    GLOBAL Pre  27.9 %    GLOBAL Post  28 %    GLOBAL % Change  0.36 %      Scores of 19 and below usually indicate a poorer quality of life in these areas.  A difference of  2-3 points is a clinically meaningful difference.  A difference of 2-3 points in the total score of the Quality of Life Index has been associated with significant improvement in overall quality of life, self-image, physical symptoms, and general health in studies assessing change in quality of life.  PHQ-9: Recent Review Flowsheet Data    Depression screen [facility] 2/9 03/05/2018 12/06/2017 11/20/2017 10/07/2017 04/22/2017   Decreased Interest 0 0 0 0 0   Down, Depressed, Hopeless 0 0 0 0 0   PHQ - 2 Score 0 0 0 0 0   Altered sleeping 0 0 - - -   Tired, decreased energy 0 0 - - -   Change in appetite 0 0 - - -   Feeling bad or failure about yourself  0 0 - - -   Trouble  concentrating 0 0 - - -   Moving slowly or fidgety/restless 0 0 - - -   Suicidal thoughts 0 0 - - -   PHQ-9 Score 0 0 - - -     Interpretation of Total Score  Total Score Depression Severity:  1-4 = Minimal depression, 5-9 = Mild depression, 10-14 = Moderate depression, 15-19 = Moderately severe depression, 20-27 = Severe depression   Psychosocial Evaluation and Intervention: Psychosocial Evaluation - 03/05/18 1310      Discharge Psychosocial Assessment & Intervention   Comments  No psychosocial barriers identified at discharge. Her exit PHQ-9 score did not change at 0. Her exit QOL score improved by 0.36% at 28%.       Psychosocial Re-Evaluation: Psychosocial Re-Evaluation    Row Name 01/01/18 1501 01/20/18 1355 02/13/18 1604         Psychosocial Re-Evaluation   Current issues with  None Identified  None Identified  None Identified     Comments  Patient's initial QOL score was 27.9 and her PHQ-9 score was 0. No psychosocial barriers identified.   Patient's initial QOL score was 27.9 and her PHQ-9 score was 0. No psychosocial barriers identified.   Patient's initial QOL score was 27.9 and her  PHQ-9 score was 0. No psychosocial barriers identified.      Expected Outcomes  Patient will have no psychosocial barriers identified at discharge.   Patient will have no psychosocial barriers identified at discharge.   Patient will have no psychosocial barriers identified at discharge.      Interventions  Stress management education;Encouraged to attend Cardiac Rehabilitation for the exercise;Relaxation education  Stress management education;Encouraged to attend Cardiac Rehabilitation for the exercise;Relaxation education  Stress management education;Encouraged to attend Cardiac Rehabilitation for the exercise;Relaxation education     Continue Psychosocial Services   No Follow up required  No Follow up required  No Follow up required        Psychosocial Discharge (Final Psychosocial  Re-Evaluation): Psychosocial Re-Evaluation - 02/13/18 1604      Psychosocial Re-Evaluation   Current issues with  None Identified    Comments  Patient's initial QOL score was 27.9 and her PHQ-9 score was 0. No psychosocial barriers identified.     Expected Outcomes  Patient will have no psychosocial barriers identified at discharge.     Interventions  Stress management education;Encouraged to attend Cardiac Rehabilitation for the exercise;Relaxation education    Continue Psychosocial Services   No Follow up required       Vocational Rehabilitation: Provide vocational rehab assistance to qualifying candidates.   Vocational Rehab Evaluation & Intervention:   Education: Education Goals: Education classes will be provided on a weekly basis, covering required topics. Participant will state understanding/return demonstration of topics presented.  Learning Barriers/Preferences: Learning Barriers/Preferences - 12/06/17 1441      Learning Barriers/Preferences   Learning Barriers  None    Learning Preferences  Written Material;Pictoral;Video       Education Topics: Hypertension, Hypertension Reduction -Define heart disease and high blood pressure. Discus how high blood pressure affects the body and ways to reduce high blood pressure.   CARDIAC REHAB PHASE II EXERCISE from 02/26/2018 in [place]  Date  12/25/17  Educator  DC  Instruction Review Code  2- Demonstrated Understanding      Exercise and Your Heart -Discuss why it is important to exercise, the FITT principles of exercise, normal and abnormal responses to exercise, and how to exercise safely.   CARDIAC REHAB PHASE II EXERCISE from 02/26/2018 in [place]  Date  01/01/18  Educator  DC  Instruction Review Code  2- Demonstrated Understanding      Angina -Discuss definition of angina, causes of angina, treatment of angina, and how to decrease risk of having angina.   CARDIAC  REHAB PHASE II EXERCISE from 02/26/2018 in [place]  Date  01/08/18  Educator  DC  Instruction Review Code  2- Demonstrated Understanding      Cardiac Medications -Review what the following cardiac medications are used for, how they affect the body, and side effects that may occur when taking the medications.  Medications include Aspirin, Beta blockers, calcium channel blockers, ACE Inhibitors, angiotensin receptor blockers, diuretics, digoxin, and antihyperlipidemics.   CARDIAC REHAB PHASE II EXERCISE from 02/26/2018 in [place]  Date  01/16/18  Educator  DC  Instruction Review Code  2- Demonstrated Understanding      Congestive Heart Failure -Discuss the definition of CHF, how to live with CHF, the signs and symptoms of CHF, and how keep track of weight and sodium intake.   CARDIAC REHAB PHASE II EXERCISE from 02/26/2018 in [place]  Date  01/23/18  Educator  DC  Instruction Review Code  2- Demonstrated Understanding      Heart Disease and Intimacy -Discus the effect sexual activity has on the heart, how changes occur during intimacy as we age, and safety during sexual activity.   CARDIAC REHAB PHASE II EXERCISE from 02/26/2018 in [place]  Date  01/30/18  Educator  DC  Instruction Review Code  2- Demonstrated Understanding      Smoking Cessation / COPD -Discuss different methods to quit smoking, the health benefits of quitting smoking, and the definition of COPD.   CARDIAC REHAB PHASE II EXERCISE from 02/26/2018 in [place]  Date  02/06/18  Educator  DC  Instruction Review Code  2- Demonstrated Understanding      Nutrition I: Fats -Discuss the types of cholesterol, what cholesterol does to the heart, and how cholesterol levels can be controlled.   CARDIAC REHAB PHASE II EXERCISE from 02/26/2018 in [place]  Date  02/12/18   Educator  DC  Instruction Review Code  2- Demonstrated Understanding      Nutrition II: Labels -Discuss the different components of food labels and how to read food label   CARDIAC REHAB PHASE II EXERCISE from 02/26/2018 in [place]  Date  02/20/18  Educator  DC  Instruction Review Code  2- Demonstrated Understanding      Heart Parts/Heart Disease and PAD -Discuss the anatomy of the heart, the pathway of blood circulation through the heart, and these are affected by heart disease.   CARDIAC REHAB PHASE II EXERCISE from 02/26/2018 in [place]  Date  02/26/18  Educator  DC  Instruction Review Code  2- Demonstrated Understanding      Stress I: Signs and Symptoms -Discuss the causes of stress, how stress may lead to anxiety and depression, and ways to limit stress.   Stress II: Relaxation -Discuss different types of relaxation techniques to limit stress.   CARDIAC REHAB PHASE II EXERCISE from 02/26/2018 in [place]  Date  12/11/17  Educator  DC  Instruction Review Code  2- Demonstrated Understanding      Warning Signs of Stroke / TIA -Discuss definition of a stroke, what the signs and symptoms are of a stroke, and how to identify when someone is having stroke.   CARDIAC REHAB PHASE II EXERCISE from 02/26/2018 in [place]  Date  12/19/17  Educator  DC  Instruction Review Code  2- Demonstrated Understanding      Knowledge Questionnaire Score: Knowledge Questionnaire Score - 03/05/18 1302      Knowledge Questionnaire Score   Pre Score  24/24    Post Score  23/24       Core Components/Risk Factors/Patient Goals at Admission: Personal Goals and Risk Factors at Admission - 12/06/17 1446      Core Components/Risk Factors/Patient Goals on Admission    Weight Management  Yes    Intervention  Weight Management/Obesity: Establish reasonable short term and long term weight  goals.    Admit Weight  206 lb (93.4 kg)    Goal Weight: Short Term  201 lb (91.2 kg)    Goal Weight: Long Term  196 lb (88.9 kg)    Expected Outcomes  Short Term: Continue to assess and modify interventions until short term weight is achieved;Long Term: Adherence to nutrition and physical activity/exercise program aimed toward attainment of established weight goal    Personal Goal Other  Yes  Personal Goal  Lose weight, be able to do things outdoors.    Intervention  Attend CR 2 x week and supplement exercise at home 3 x week.     Expected Outcomes  Achieve personal goals       Core Components/Risk Factors/Patient Goals Review:  Goals and Risk Factor Review    Row Name 01/01/18 1458 01/20/18 1352 02/13/18 1559 03/05/18 1304       Core Components/Risk Factors/Patient Goals Review   Personal Goals Review  Weight Management/Obesity Lose weight 10-15 lbs. Be able to do things outdoors.   Weight Management/Obesity Lose weight 10-15 lbs; be able to do things outdoors.   Weight Management/Obesity;Diabetes Lose weight 10-15 lbs; be able to do thing outdoors.   Weight Management/Obesity;Diabetes Lose weight 10-15 lbs; be able to do things outdoors.     Review  Patient has completed 10 sessions maintaining her weight. She says she does not think the program has made a difference yet in how she feels. She has not progressed on the treadmill due to fear it may cause hip pain. Will continue to monitor for progress.   Patient has completed 17 sessions maintaining her weight since her last 30 day review. She continues to do well in the program with progression. She says she feels stronger and has more energy since she started the program. She is not able to do thing outdoors yet due to the weather. She has progressed on the treadmill some with no complaints of hip pain. Will continue to monitor for progress.   Patient has completed 28 sessions gaining 3 lbs since last 30 day review. She continues to do well  in the program with progression on the treadmill. She continues to gain strength and says her energy continues to increase. He gait has also improved on the treadmill with no complaints of hip pain.  Her DM remained controlled reporting fasting readings WNL.  She says she plans to work in her flower garden soon. She says the program has really helped her overall. Will continue to monitor for progress.    Patient graduated with 36 sessions gaining 6.6 lbs. She did good in the program. Her exit measurements increased in grip strength and her walk test improved by 20%. She says she feels better overall since she started the program and has more stamina to do things outside. She is exercising 5 to 6 days/week. She is able to work in her garden without difficulty. Her balance has also improved. She plans to continue exercising at home on her treadmill.     Expected Outcomes  Patient will continue to attend sessions and complete the program meeting her personal goals.   Patient will continue to attend sessions and complete the program meeting her personal goals.   Patient will continue to attend sessions and complete the program meeting her personal goals.   Patient will continue exercising at home on her treadmill and exercise bike and continue to meet her personal goals. CR will f/u for 1 year.        Core Components/Risk Factors/Patient Goals at Discharge (Final Review):  Goals and Risk Factor Review - 03/05/18 1304      Core Components/Risk Factors/Patient Goals Review   Personal Goals Review  Weight Management/Obesity;Diabetes Lose weight 10-15 lbs; be able to do things outdoors.     Review  Patient graduated with 36 sessions gaining 6.6 lbs. She did good in the program. Her exit measurements increased in grip strength and her walk test  improved by 20%. She says she feels better overall since she started the program and has more stamina to do things outside. She is exercising 5 to 6 days/week. She is able  to work in her garden without difficulty. Her balance has also improved. She plans to continue exercising at home on her treadmill.     Expected Outcomes  Patient will continue exercising at home on her treadmill and exercise bike and continue to meet her personal goals. CR will f/u for 1 year.        ITP Comments: ITP Comments    Row Name 12/06/17 1443           ITP Comments  Mrs. Pettijohn is a pleasant female that is ready to start CR. She has a fear of falling because she has fallen in the past and broken bones. She is still taking antibiotics for an infection in her drain tube site.           Comments: Patient graduated from [facility] [facility] today on 03/03/18 after completing 36 sessions. She achieved LTG of 30 minutes of aerobic exercise at Max Met level of 2.3. All patients vitals are WNL. Patient has met with dietician. Discharge instruction has been reviewed in detail and patient stated an understanding of material given. Patient plans to continue exercising at home on her Treadmill and exercise bike. Cardiac Rehab staff will make f/u calls at 1 month, 6 months, and 1 year. Patient had no complaints of any abnormal S/S or pain on their exit visit.

## 2018-03-05 NOTE — Progress Notes (Signed)
Discharge Progress Report  Patient Details  Name: Toni Cooper MRN: [id number] Date of Birth: 1945-12-12 Referring Provider:     CARDIAC REHAB PHASE II ORIENTATION from 12/06/2017 in [place]  Referring Provider  Dr. Percival Spanish       Number of Visits: 36  Reason for Discharge:  Patient reached a stable level of exercise. Patient independent in their exercise. Patient has met program and personal goals.  Smoking History:  Social History   Tobacco Use  Smoking Status Never Smoker  Smokeless Tobacco Never Used    Diagnosis:  S/P aortic valve replacement with bioprosthetic valve  ADL UCSD:   Initial Exercise Prescription: Initial Exercise Prescription - 12/06/17 1400      Date of Initial Exercise RX and Referring Provider   Date  12/06/17    Referring Provider  Dr. Percival Spanish      Treadmill   MPH  1    Grade  0    Minutes  15    METs  1.7      NuStep   Level  2    SPM  54    Minutes  20    METs  1.6      Prescription Details   Frequency (times per week)  3    Duration  Progress to 30 minutes of continuous aerobic without signs/symptoms of physical distress      Intensity   THRR 40-80% of Max Heartrate  100-116-132    Ratings of Perceived Exertion  11-13    Perceived Dyspnea  0-4      Progression   Progression  Continue progressive overload as per policy without signs/symptoms or physical distress.      Resistance Training   Training Prescription  Yes    Weight  1    Reps  10-15       Discharge Exercise Prescription (Final Exercise Prescription Changes): Exercise Prescription Changes - 02/12/18 1400      Response to Exercise   Blood Pressure (Admit)  140/60    Blood Pressure (Exercise)  140/60    Blood Pressure (Exit)  120/60    Heart Rate (Admit)  69 bpm    Heart Rate (Exercise)  90 bpm    Heart Rate (Exit)  77 bpm    Rating of Perceived Exertion (Exercise)  12    Duration  Progress to 30 minutes of  aerobic without  signs/symptoms of physical distress    Intensity  THRR unchanged 101-117-132      Progression   Progression  Continue to progress workloads to maintain intensity without signs/symptoms of physical distress.      Resistance Training   Training Prescription  Yes    Weight  2    Reps  10-15      Treadmill   MPH  1.7    Grade  0    Minutes  15    METs  2.3      NuStep   Level  2    SPM  90    Minutes  20    METs  2      Home Exercise Plan   Plans to continue exercise at  Home (comment)    Frequency  Add 2 additional days to program exercise sessions.    Initial Home Exercises Provided  12/11/17       Functional Capacity: 6 Minute Walk    Row Name 12/06/17 1418 03/04/18 0756       6 Minute  Walk   Phase  Initial  Discharge    Distance  1000 feet  1200 feet    Distance % Change  0 %  20 %    Distance Feet Change  0 ft  200 ft    Walk Time  6 minutes  6 minutes    # of Rest Breaks  0  0    MPH  1.89  2.27    METS  2.45  2.74    RPE  12  12    Perceived Dyspnea   9  11    VO2 Peak  6.81  8.62    Symptoms  No  No    Resting HR  68 bpm  87 bpm    Resting BP  120/60  130/60    Resting Oxygen Saturation   98 %  90 %    Exercise Oxygen Saturation  during 6 min walk  95 %  85 %    Max Ex. HR  102 bpm  117 bpm    Max Ex. BP  148/70  156/70    2 Minute Post BP  120/66  132/66       Psychological, QOL, Others - Outcomes: PHQ 2/9: Depression screen [facility] 2/9 03/05/2018 12/06/2017 11/20/2017 10/07/2017 04/22/2017  Decreased Interest 0 0 0 0 0  Down, Depressed, Hopeless 0 0 0 0 0  PHQ - 2 Score 0 0 0 0 0  Altered sleeping 0 0 - - -  Tired, decreased energy 0 0 - - -  Change in appetite 0 0 - - -  Feeling bad or failure about yourself  0 0 - - -  Trouble concentrating 0 0 - - -  Moving slowly or fidgety/restless 0 0 - - -  Suicidal thoughts 0 0 - - -  PHQ-9 Score 0 0 - - -    Quality of Life: Quality of Life - 03/04/18 0758      Quality of Life Scores   Health/Function  Pre  26.58 %    Health/Function Post  27.6 %    Health/Function % Change  3.84 %    Socioeconomic Pre  29.58 %    Socioeconomic Post  30 %    Socioeconomic % Change   1.42 %    Psych/Spiritual Pre  27.43 %    Psych/Spiritual Post  28.29 %    Psych/Spiritual % Change  3.14 %    Family Pre  30 %    Family Post  26.4 %    Family % Change  -12 %    GLOBAL Pre  27.9 %    GLOBAL Post  28 %    GLOBAL % Change  0.36 %       Personal Goals: Goals established at orientation with interventions provided to work toward goal. Personal Goals and Risk Factors at Admission - 12/06/17 1446      Core Components/Risk Factors/Patient Goals on Admission    Weight Management  Yes    Intervention  Weight Management/Obesity: Establish reasonable short term and long term weight goals.    Admit Weight  206 lb (93.4 kg)    Goal Weight: Short Term  201 lb (91.2 kg)    Goal Weight: Long Term  196 lb (88.9 kg)    Expected Outcomes  Short Term: Continue to assess and modify interventions until short term weight is achieved;Long Term: Adherence to nutrition and physical activity/exercise program aimed toward attainment of established weight goal  Personal Goal Other  Yes    Personal Goal  Lose weight, be able to do things outdoors.    Intervention  Attend CR 2 x week and supplement exercise at home 3 x week.     Expected Outcomes  Achieve personal goals        Personal Goals Discharge: Goals and Risk Factor Review    Row Name 01/01/18 1458 01/20/18 1352 02/13/18 1559 03/05/18 1304       Core Components/Risk Factors/Patient Goals Review   Personal Goals Review  Weight Management/Obesity Lose weight 10-15 lbs. Be able to do things outdoors.   Weight Management/Obesity Lose weight 10-15 lbs; be able to do things outdoors.   Weight Management/Obesity;Diabetes Lose weight 10-15 lbs; be able to do thing outdoors.   Weight Management/Obesity;Diabetes Lose weight 10-15 lbs; be able to do things outdoors.      Review  Patient has completed 10 sessions maintaining her weight. She says she does not think the program has made a difference yet in how she feels. She has not progressed on the treadmill due to fear it may cause hip pain. Will continue to monitor for progress.   Patient has completed 17 sessions maintaining her weight since her last 30 day review. She continues to do well in the program with progression. She says she feels stronger and has more energy since she started the program. She is not able to do thing outdoors yet due to the weather. She has progressed on the treadmill some with no complaints of hip pain. Will continue to monitor for progress.   Patient has completed 28 sessions gaining 3 lbs since last 30 day review. She continues to do well in the program with progression on the treadmill. She continues to gain strength and says her energy continues to increase. He gait has also improved on the treadmill with no complaints of hip pain.  Her DM remained controlled reporting fasting readings WNL.  She says she plans to work in her flower garden soon. She says the program has really helped her overall. Will continue to monitor for progress.    Patient graduated with 36 sessions gaining 6.6 lbs. She did good in the program. Her exit measurements increased in grip strength and her walk test improved by 20%. She says she feels better overall since she started the program and has more stamina to do things outside. She is exercising 5 to 6 days/week. She is able to work in her garden without difficulty. Her balance has also improved. She plans to continue exercising at home on her treadmill.     Expected Outcomes  Patient will continue to attend sessions and complete the program meeting her personal goals.   Patient will continue to attend sessions and complete the program meeting her personal goals.   Patient will continue to attend sessions and complete the program meeting her personal goals.   Patient will  continue exercising at home on her treadmill and exercise bike and continue to meet her personal goals. CR will f/u for 1 year.        Exercise Goals and Review: Exercise Goals    Row Name 12/06/17 1420             Exercise Goals   Increase Physical Activity  Yes       Intervention  Provide advice, education, support and counseling about physical activity/exercise needs.;Develop an individualized exercise prescription for aerobic and resistive training based on initial evaluation findings, risk stratification, comorbidities  and participant's personal goals.       Expected Outcomes  Short Term: Attend rehab on a regular basis to increase amount of physical activity.       Increase Strength and Stamina  Yes       Intervention  Develop an individualized exercise prescription for aerobic and resistive training based on initial evaluation findings, risk stratification, comorbidities and participant's personal goals.;Provide advice, education, support and counseling about physical activity/exercise needs.       Expected Outcomes  Short Term: Increase workloads from initial exercise prescription for resistance, speed, and METs.       Able to understand and use rate of perceived exertion (RPE) scale  Yes       Intervention  Provide education and explanation on how to use RPE scale       Expected Outcomes  Short Term: Able to use RPE daily in rehab to express subjective intensity level;Long Term:  Able to use RPE to guide intensity level when exercising independently       Able to understand and use Dyspnea scale  Yes       Intervention  Provide education and explanation on how to use Dyspnea scale       Expected Outcomes  Short Term: Able to use Dyspnea scale daily in rehab to express subjective sense of shortness of breath during exertion;Long Term: Able to use Dyspnea scale to guide intensity level when exercising independently       Knowledge and understanding of Target Heart Rate Range (THRR)   Yes       Intervention  Provide education and explanation of THRR including how the numbers were predicted and where they are located for reference       Expected Outcomes  Short Term: Able to state/look up THRR       Able to check pulse independently  Yes       Intervention  Provide education and demonstration on how to check pulse in carotid and radial arteries.;Review the importance of being able to check your own pulse for safety during independent exercise       Expected Outcomes  Short Term: Able to explain why pulse checking is important during independent exercise;Long Term: Able to check pulse independently and accurately       Understanding of Exercise Prescription  Yes       Intervention  Provide education, explanation, and written materials on patient's individual exercise prescription       Expected Outcomes  Short Term: Able to explain program exercise prescription;Long Term: Able to explain home exercise prescription to exercise independently          Nutrition & Weight - Outcomes: Pre Biometrics - 12/06/17 1420      Pre Biometrics   Height  _0  (1.626 m)    Weight  206 lb (93.4 kg)    Waist Circumference  47 inches    Hip Circumference  47.5 inches    Waist to Hip Ratio  0.99 %    BMI (Calculated)  35.34    Triceps Skinfold  22 mm    % Body Fat  46.4 %    Grip Strength  38.8 kg    Flexibility  0 in    Single Leg Stand  0 seconds      Post Biometrics - 03/04/18 0757       Post  Biometrics   Height  _1  (1.626 m)    Weight  213 lb 0.5 oz (  96.6 kg)    Waist Circumference  47 inches    Hip Circumference  46.5 inches    Waist to Hip Ratio  1.01 %    BMI (Calculated)  36.55    Triceps Skinfold  23 mm    % Body Fat  47.3 %    Grip Strength  38.73 kg    Flexibility  0 in    Single Leg Stand  0 seconds       Nutrition: Nutrition Therapy & Goals - 02/17/18 1411      Nutrition Therapy   RD appointment deferred  Yes      Personal Nutrition Goals   Personal  Goal #2  She eats heart healthy, low carb, low sugar.     Additional Goals?  No       Nutrition Discharge: Nutrition Assessments - 03/05/18 1303      MEDFICTS Scores   Pre Score  18    Post Score  3    Score Difference  -15       Education Questionnaire Score: Knowledge Questionnaire Score - 03/05/18 1302      Knowledge Questionnaire Score   Pre Score  24/24    Post Score  23/24       Goals reviewed with patient; copy given to patient.

## 2018-04-02 ENCOUNTER — Ambulatory Visit: Payer: Self-pay | Admitting: Cardiology

## 2018-04-08 ENCOUNTER — Ambulatory Visit: Payer: Medicare Other | Admitting: Nurse Practitioner

## 2018-04-08 ENCOUNTER — Encounter: Payer: Self-pay | Admitting: Nurse Practitioner

## 2018-04-08 VITALS — BP 121/61 | HR 64 | Temp 97.8°F | Ht 64.0 in | Wt 213.0 lb

## 2018-04-08 DIAGNOSIS — E119 Type 2 diabetes mellitus without complications: Secondary | ICD-10-CM | POA: Diagnosis not present

## 2018-04-08 DIAGNOSIS — E785 Hyperlipidemia, unspecified: Secondary | ICD-10-CM | POA: Diagnosis not present

## 2018-04-08 DIAGNOSIS — I10 Essential (primary) hypertension: Secondary | ICD-10-CM | POA: Diagnosis not present

## 2018-04-08 DIAGNOSIS — M810 Age-related osteoporosis without current pathological fracture: Secondary | ICD-10-CM

## 2018-04-08 MED ORDER — ATORVASTATIN CALCIUM 10 MG PO TABS
10.0000 mg | ORAL_TABLET | Freq: Every day | ORAL | 1 refills | Status: DC
Start: 2018-04-08 — End: 2018-04-08

## 2018-04-08 MED ORDER — HYDROCHLOROTHIAZIDE 25 MG PO TABS: 25.0000 mg | ORAL_TABLET | Freq: Every day | ORAL | 1 refills | Status: DC

## 2018-04-08 MED ORDER — ATORVASTATIN CALCIUM 10 MG PO TABS: 10.0000 mg | ORAL_TABLET | Freq: Every day | ORAL | 5 refills | Status: DC

## 2018-04-08 MED ORDER — METFORMIN HCL ER 500 MG PO TB24: 500.0000 mg | ORAL_TABLET | Freq: Two times a day (BID) | ORAL | 1 refills | Status: DC

## 2018-04-08 MED ORDER — LISINOPRIL 20 MG PO TABS: 20.0000 mg | ORAL_TABLET | Freq: Every day | ORAL | 3 refills | Status: DC

## 2018-04-08 NOTE — Progress Notes (Signed)
Subjective:    Patient ID: Toni Cooper, female    DOB: [birth year], 72 y.o.   MRN: [id number]   Chief Complaint: Medical Management of Chronic Issues   HPI:  1. Essential hypertension  No c/o chest pain, sob or headache. Does not check blood pressure at home. BP Readings from Last 3 Encounters:  04/08/18 121/61  02/25/18 140/72  01/13/18 (!) 150/70     2. Type 2 diabetes mellitus without complication, without long-term current use of insulin ([place]) sees dr. Chalmers Cater and last HGBA1c was 6.7%. Blood sugars have been averaging below 140.  3. Age-related osteoporosis without current pathological fracture  dexascan was done 11/28/16 with t score of -3.4 and she is on fosamax weekly without side effects.  4. Hyperlipidemia, unspecified hyperlipidemia type  Not watching diet and not doing as much exercise since she had open heart surgery.    Outpatient Encounter Medications as of 04/08/2018  Medication Sig  . acetaminophen (TYLENOL) 500 MG tablet Take 500 mg 2 (two) times daily as needed by mouth for moderate pain or headache.   . alendronate (FOSAMAX) 70 MG tablet TAKE 1 TABLET BY MOUTH  EVERY 7 DAYS WITH A FULL  GLASS OF WATER ON AN EMPTY  STOMACH  . Ascorbic Acid (VITAMIN C) 1000 MG tablet Take 1,000 mg daily by mouth.  [name] aspirin 81 MG EC tablet Take 1 tablet (81 mg total) by mouth daily.  [name] atorvastatin (LIPITOR) 10 MG tablet Take 1 tablet (10 mg total) by mouth daily at 6 PM.  . beta carotene w/minerals (OCUVITE) tablet Take 1 tablet by mouth daily.  . calcium carbonate (TUMS - DOSED IN MG ELEMENTAL CALCIUM) 500 MG chewable tablet Chew 1 tablet by mouth daily as needed for indigestion or heartburn.  . calcium-vitamin D (OSCAL 500/200 D-3) 500-200 MG-UNIT tablet Take 1 tablet by mouth 2 (two) times daily.  . cholecalciferol (VITAMIN D) 1000 UNITS tablet Take 1,000 Units by mouth daily.  . fluticasone (FLONASE) 50 MCG/ACT nasal spray Place 1 spray into both nostrils daily as needed for  allergies or rhinitis.  [name] HUMALOG 100 UNIT/ML injection Inject 6-20 Units into the skin 3 (three) times daily with meals. Using sliding scale.  . hydrochlorothiazide (HYDRODIURIL) 25 MG tablet Take 1 tablet (25 mg total) daily by mouth.  [name] LANTUS 100 UNIT/ML injection Inject 35 Units into the skin at bedtime.   [name] lisinopril (PRINIVIL,ZESTRIL) 20 MG tablet Take 1 tablet (20 mg total) by mouth daily.  . Magnesium 250 MG TABS Take 250 mg by mouth daily.   . metFORMIN (GLUCOPHAGE-XR) 500 MG 24 hr tablet Take 1 tablet (500 mg total) 2 (two) times daily by mouth.  . metoprolol tartrate (LOPRESSOR) 12.5 mg TABS tablet Take 12.5 mg by mouth 2 (two) times daily.  . Multiple Vitamins-Minerals (MULTI-BETIC DIABETES) TABS Take 1 tablet daily by mouth.  . pyridOXINE (VITAMIN B-6) 100 MG tablet Take 100 mg by mouth daily.  [name] TEST test strip Test four times daily.     New complaints: None today  Social history: Lives with husband who has been retired.    Review of Systems  Constitutional: Negative for activity change and appetite change.  HENT: Negative.   Eyes: Negative for pain.  Respiratory: Negative for shortness of breath.   Cardiovascular: Negative for chest pain, palpitations and leg swelling.  Gastrointestinal: Negative for abdominal pain.  Endocrine: Negative for polydipsia.  Genitourinary: Negative.   Skin: Negative for rash.  Neurological: Negative  for dizziness, weakness and headaches.  Hematological: Does not bruise/bleed easily.  Psychiatric/Behavioral: Negative.   All other systems reviewed and are negative.      Objective:   Physical Exam  Constitutional: She is oriented to person, place, and time.  HENT:  Head: Normocephalic.  Nose: Nose normal.  Mouth/Throat: Oropharynx is clear and moist.  Eyes: Pupils are equal, round, and reactive to light. EOM are normal.  Neck: Normal range of motion. Neck supple. No JVD present. Carotid bruit is not present.    Cardiovascular: Normal rate, regular rhythm, normal heart sounds and intact distal pulses.  Pulmonary/Chest: Effort normal and breath sounds normal. No respiratory distress. She has no wheezes. She has no rales. She exhibits no tenderness.  Abdominal: Soft. Normal appearance, normal aorta and bowel sounds are normal. She exhibits no distension, no abdominal bruit, no pulsatile midline mass and no mass. There is no splenomegaly or hepatomegaly. There is no tenderness.  Musculoskeletal: Normal range of motion. She exhibits no edema.  Lymphadenopathy:    She has no cervical adenopathy.  Neurological: She is alert and oriented to person, place, and time. She has normal reflexes.  Skin: Skin is warm and dry.  Psychiatric: Judgment normal.   hgba1c 6.7   BP 121/61   Pulse 64   Temp 97.8 F (36.6 C) (Oral)   Ht 5\' 4"  (1.626 m)   Wt 213 lb (96.6 kg)   BMI 36.56 kg/m       Assessment & Plan:  SHINIQUA GROSECLOSE comes in today with chief complaint of Medical Management of Chronic Issues   Diagnosis and orders addressed:  1. Essential hypertension Low sodium diet - hydrochlorothiazide (HYDRODIURIL) 25 MG tablet; Take 1 tablet (25 mg total) by mouth daily.  Dispense: 90 tablet; Refill: 1 - lisinopril (PRINIVIL,ZESTRIL) 20 MG tablet; Take 1 tablet (20 mg total) by mouth daily.  Dispense: 90 tablet; Refill: 3  2. Type 2 diabetes mellitus without complication, without long-term current use of insulin ([facility]) Continue to watch carbs in diet - metFORMIN (GLUCOPHAGE-XR) 500 MG 24 hr tablet; Take 1 tablet (500 mg total) by mouth 2 (two) times daily.  Dispense: 180 tablet; Refill: 1  3. Age-related osteoporosis without current pathological fracture weight bearing exercises encouraged  4. Hyperlipidemia, unspecified hyperlipidemia type Low fat diet encouraged - atorvastatin (LIPITOR) 10 MG tablet; Take 1 tablet (10 mg total) by mouth daily at 6 PM.  Dispense: 30 tablet; Refill: 1   Labs  pending Health Maintenance reviewed Diet and exercise encouraged  Follow up plan: 3 months   Mary-Margaret Hassell Done, FNP

## 2018-04-08 NOTE — Addendum Note (Signed)
Addended by: [name] on: 04/08/2018 09:18 AM   Modules accepted: Orders

## 2018-04-08 NOTE — Patient Instructions (Signed)
Bone Health Bones protect organs, store calcium, and anchor muscles. Good health habits, such as eating nutritious foods and exercising regularly, are important for maintaining healthy bones. They can also help to prevent a condition that causes bones to lose density and become weak and brittle (osteoporosis). Why is bone mass important? Bone mass refers to the amount of bone tissue that you have. The higher your bone mass, the stronger your bones. An important step toward having healthy bones throughout life is to have strong and dense bones during childhood. A young adult who has a high bone mass is more likely to have a high bone mass later in life. Bone mass at its greatest it is called peak bone mass. A large decline in bone mass occurs in older adults. In women, it occurs about the time of menopause. During this time, it is important to practice good health habits, because if more bone is lost than what is replaced, the bones will become less healthy and more likely to break (fracture). If you find that you have a low bone mass, you may be able to prevent osteoporosis or further bone loss by changing your diet and lifestyle. How can I find out if my bone mass is low? Bone mass can be measured with an X-ray test that is called a bone mineral density (BMD) test. This test is recommended for all women who are age 65 or older. It may also be recommended for men who are age 70 or older, or for people who are more likely to develop osteoporosis due to:  Having bones that break easily.  Having a long-term disease that weakens bones, such as kidney disease or rheumatoid arthritis.  Having menopause earlier than normal.  Taking medicine that weakens bones, such as steroids, thyroid hormones, or hormone treatment for breast cancer or prostate cancer.  Smoking.  Drinking three or more alcoholic drinks each day.  What are the nutritional recommendations for healthy bones? To have healthy bones, you  need to get enough of the right minerals and vitamins. Most nutrition experts recommend getting these nutrients from the foods that you eat. Nutritional recommendations vary from person to person. Ask your health care provider what is healthy for you. Here are some general guidelines. Calcium Recommendations Calcium is the most important (essential) mineral for bone health. Most people can get enough calcium from their diet, but supplements may be recommended for people who are at risk for osteoporosis. Good sources of calcium include:  Dairy products, such as low-fat or nonfat milk, cheese, and yogurt.  Dark green leafy vegetables, such as bok choy and broccoli.  Calcium-fortified foods, such as orange juice, cereal, bread, soy beverages, and tofu products.  Nuts, such as almonds.  Follow these recommended amounts for daily calcium intake:  Children, age 1?3: 700 mg.  Children, age 4?8: 1,000 mg.  Children, age 9?13: 1,300 mg.  Teens, age 14?18: 1,300 mg.  Adults, age 19?50: 1,000 mg.  Adults, age 51?70: ? Men: 1,000 mg. ? Women: 1,200 mg.  Adults, age 71 or older: 1,200 mg.  Pregnant and breastfeeding females: ? Teens: 1,300 mg. ? Adults: 1,000 mg.  Vitamin D Recommendations Vitamin D is the most essential vitamin for bone health. It helps the body to absorb calcium. Sunlight stimulates the skin to make vitamin D, so be sure to get enough sunlight. If you live in a cold climate or you do not get outside often, your health care provider may recommend that you take vitamin   D supplements. Good sources of vitamin D in your diet include:  Egg yolks.  Saltwater fish.  Milk and cereal fortified with vitamin D.  Follow these recommended amounts for daily vitamin D intake:  Children and teens, age 1?18: 600 international units.  Adults, age 50 or younger: 400-800 international units.  Adults, age 51 or older: 800-1,000 international units.  Other Nutrients Other nutrients  for bone health include:  Phosphorus. This mineral is found in meat, poultry, dairy foods, nuts, and legumes. The recommended daily intake for adult men and adult women is 700 mg.  Magnesium. This mineral is found in seeds, nuts, dark green vegetables, and legumes. The recommended daily intake for adult men is 400?420 mg. For adult women, it is 310?320 mg.  Vitamin K. This vitamin is found in green leafy vegetables. The recommended daily intake is 120 mg for adult men and 90 mg for adult women.  What type of physical activity is best for building and maintaining healthy bones? Weight-bearing and strength-building activities are important for building and maintaining peak bone mass. Weight-bearing activities cause muscles and bones to work against gravity. Strength-building activities increases muscle strength that supports bones. Weight-bearing and muscle-building activities include:  Walking and hiking.  Jogging and running.  Dancing.  Gym exercises.  Lifting weights.  Tennis and racquetball.  Climbing stairs.  Aerobics.  Adults should get at least 30 minutes of moderate physical activity on most days. Children should get at least 60 minutes of moderate physical activity on most days. Ask your health care provide what type of exercise is best for you. Where can I find more information? For more information, check out the following websites:  National Osteoporosis Foundation: http://nof.org/learn/basics  National Institutes of Health: http://www.niams.nih.gov/Health_Info/Bone/Bone_Health/bone_health_for_life.asp  This information is not intended to replace advice given to you by your health care provider. Make sure you discuss any questions you have with your health care provider. Document Released: 01/26/2004 Document Revised: 05/25/2016 Document Reviewed: 11/10/2014 Elsevier Interactive Patient Education  2018 Elsevier Inc.  

## 2018-08-26 ENCOUNTER — Other Ambulatory Visit: Payer: Self-pay | Admitting: Nurse Practitioner

## 2018-08-26 DIAGNOSIS — E785 Hyperlipidemia, unspecified: Secondary | ICD-10-CM

## 2018-08-27 NOTE — Telephone Encounter (Signed)
Last lipid 09/26/17

## 2018-09-02 ENCOUNTER — Ambulatory Visit (INDEPENDENT_AMBULATORY_CARE_PROVIDER_SITE_OTHER): Payer: Medicare Other

## 2018-09-02 ENCOUNTER — Other Ambulatory Visit: Payer: Self-pay | Admitting: Nurse Practitioner

## 2018-09-02 ENCOUNTER — Other Ambulatory Visit: Payer: Self-pay | Admitting: Physician Assistant

## 2018-09-02 DIAGNOSIS — Z23 Encounter for immunization: Secondary | ICD-10-CM

## 2018-09-02 DIAGNOSIS — E785 Hyperlipidemia, unspecified: Secondary | ICD-10-CM

## 2018-09-02 DIAGNOSIS — E119 Type 2 diabetes mellitus without complications: Secondary | ICD-10-CM

## 2018-09-02 DIAGNOSIS — I10 Essential (primary) hypertension: Secondary | ICD-10-CM

## 2018-09-02 NOTE — Telephone Encounter (Signed)
Rx has been sent to the pharmacy electronically. ° °

## 2018-09-03 NOTE — Telephone Encounter (Signed)
Ov 10/09/18

## 2018-10-09 ENCOUNTER — Ambulatory Visit: Payer: Medicare Other | Admitting: Nurse Practitioner

## 2018-10-09 ENCOUNTER — Encounter: Payer: Self-pay | Admitting: Nurse Practitioner

## 2018-10-09 VITALS — BP 128/63 | HR 68 | Temp 97.1°F | Ht 64.0 in | Wt 214.0 lb

## 2018-10-09 DIAGNOSIS — I10 Essential (primary) hypertension: Secondary | ICD-10-CM | POA: Diagnosis not present

## 2018-10-09 DIAGNOSIS — M810 Age-related osteoporosis without current pathological fracture: Secondary | ICD-10-CM | POA: Diagnosis not present

## 2018-10-09 DIAGNOSIS — E785 Hyperlipidemia, unspecified: Secondary | ICD-10-CM

## 2018-10-09 DIAGNOSIS — E119 Type 2 diabetes mellitus without complications: Secondary | ICD-10-CM | POA: Diagnosis not present

## 2018-10-09 DIAGNOSIS — R251 Tremor, unspecified: Secondary | ICD-10-CM

## 2018-10-09 DIAGNOSIS — R011 Cardiac murmur, unspecified: Secondary | ICD-10-CM

## 2018-10-09 DIAGNOSIS — Z6838 Body mass index (BMI) 38.0-38.9, adult: Secondary | ICD-10-CM

## 2018-10-09 MED ORDER — LORAZEPAM 0.5 MG PO TABS: 0.5000 mg | ORAL_TABLET | Freq: Two times a day (BID) | ORAL | 1 refills | Status: DC | PRN

## 2018-10-09 MED ORDER — ALENDRONATE SODIUM 70 MG PO TABS: 70.0000 mg | ORAL_TABLET | ORAL | 1 refills | Status: DC

## 2018-10-09 MED ORDER — ATORVASTATIN CALCIUM 10 MG PO TABS: ORAL_TABLET | ORAL | 1 refills | Status: DC

## 2018-10-09 NOTE — Progress Notes (Signed)
Subjective:    Patient ID: Toni Cooper, female    DOB: [birth year], 72 y.o.   MRN: [id number]   Chief Complaint: medical management of chronic issues  HPI:  1. Essential hypertension  No c/o chest pain, sob or headache. Does not check blood pressure at home. BP Readings from Last 3 Encounters:  04/08/18 121/61  02/25/18 140/72  01/13/18 (!) 150/70     2. Type 2 diabetes mellitus without complication, without long-term current use of insulin ([place]) last hgba1c was 6.9%. She checks blood sugars up to 4x a day and does sliding scale with meals. She see dr. Merlene Laughter.  3. Age-related osteoporosis without current pathological fracture  Last dexascan was in 2018 -3.4. She is currently on fosamax- may change her to prolia when we repeat next dexascan. She has no c/o back pain.  4. Class 2 severe obesity due to excess calories with serious comorbidity and body mass index (BMI) of 38.0 to 38.9 in adult [facility])  No recent weight changes  5. Hyperlipidemia, unspecified hyperlipidemia type  Does not exercise and does not watch diet  6. Murmur  Had valve replacement at end of last year. Says she is doing well.    Outpatient Encounter Medications as of 10/09/2018  Medication Sig  . acetaminophen (TYLENOL) 500 MG tablet Take 500 mg 2 (two) times daily as needed by mouth for moderate pain or headache.   . alendronate (FOSAMAX) 70 MG tablet TAKE 1 TABLET BY MOUTH  EVERY 7 DAYS WITH A FULL  GLASS OF WATER ON AN EMPTY  STOMACH  . Ascorbic Acid (VITAMIN C) 1000 MG tablet Take 1,000 mg daily by mouth.  [name] aspirin 81 MG EC tablet Take 1 tablet (81 mg total) by mouth daily.  [name] atorvastatin (LIPITOR) 10 MG tablet TAKE 1 TABLET BY MOUTH  DAILY AT 6PM.  . beta carotene w/minerals (OCUVITE) tablet Take 1 tablet by mouth daily.  . calcium carbonate (TUMS - DOSED IN MG ELEMENTAL CALCIUM) 500 MG chewable tablet Chew 1 tablet by mouth daily as needed for indigestion or heartburn.  . calcium-vitamin D (OSCAL 500/200  D-3) 500-200 MG-UNIT tablet Take 1 tablet by mouth 2 (two) times daily.  . cholecalciferol (VITAMIN D) 1000 UNITS tablet Take 1,000 Units by mouth daily.  . fluticasone (FLONASE) 50 MCG/ACT nasal spray Place 1 spray into both nostrils daily as needed for allergies or rhinitis.  [name] HUMALOG 100 UNIT/ML injection Inject 6-20 Units into the skin 3 (three) times daily with meals. Using sliding scale.  . hydrochlorothiazide (HYDRODIURIL) 25 MG tablet TAKE 1 TABLET BY MOUTH  DAILY  . LANTUS 100 UNIT/ML injection Inject 35 Units into the skin at bedtime.   [name] lisinopril (PRINIVIL,ZESTRIL) 20 MG tablet Take 1 tablet (20 mg total) by mouth daily.  . Magnesium 250 MG TABS Take 250 mg by mouth daily.   . metFORMIN (GLUCOPHAGE-XR) 500 MG 24 hr tablet TAKE 1 TABLET BY MOUTH TWO  TIMES DAILY  . metoprolol tartrate (LOPRESSOR) 12.5 mg TABS tablet Take 12.5 mg by mouth 2 (two) times daily.  . metoprolol tartrate (LOPRESSOR) 25 MG tablet TAKE 1 TABLET BY MOUTH TWO  TIMES DAILY  . Multiple Vitamins-Minerals (MULTI-BETIC DIABETES) TABS Take 1 tablet daily by mouth.  . pyridOXINE (VITAMIN B-6) 100 MG tablet Take 100 mg by mouth daily.  [name] TEST test strip Test four times daily.       New complaints: Has long term tremor of bil hands- she use to  be on ativan that she took on an as needed basis which helped. She wouldl ike to have on hand if needed.  Social history: Lives with husband. Her daughter is a Designer, jewellery at [employer].   Review of Systems  Constitutional: Negative for activity change and appetite change.  HENT: Negative.   Eyes: Negative for pain.  Respiratory: Negative for shortness of breath.   Cardiovascular: Negative for chest pain, palpitations and leg swelling.  Gastrointestinal: Negative for abdominal pain.  Endocrine: Negative for polydipsia.  Genitourinary: Negative.   Skin: Negative for rash.  Neurological: Negative for dizziness, weakness and headaches.    Hematological: Does not bruise/bleed easily.  Psychiatric/Behavioral: Negative.   All other systems reviewed and are negative.      Objective:   Physical Exam  Constitutional: She is oriented to person, place, and time. She appears well-developed and well-nourished. No distress.  HENT:  Head: Normocephalic.  Nose: Nose normal.  Mouth/Throat: Oropharynx is clear and moist.  Eyes: Pupils are equal, round, and reactive to light. EOM are normal.  Neck: Normal range of motion. Neck supple. No JVD present. Carotid bruit is not present.  Cardiovascular: Normal rate, regular rhythm and intact distal pulses.  Murmur (2/6) heard. Pulmonary/Chest: Effort normal and breath sounds normal. No respiratory distress. She has no wheezes. She has no rales. She exhibits no tenderness.  Abdominal: Soft. Normal appearance, normal aorta and bowel sounds are normal. She exhibits no distension, no abdominal bruit, no pulsatile midline mass and no mass. There is no splenomegaly or hepatomegaly. There is no tenderness.  Musculoskeletal: Normal range of motion. She exhibits no edema.  Lymphadenopathy:    She has no cervical adenopathy.  Neurological: She is alert and oriented to person, place, and time. She has normal reflexes.  Skin: Skin is warm and dry.  Psychiatric: She has a normal mood and affect. Her behavior is normal. Judgment and thought content normal.  Nursing note and vitals reviewed.     BP 128/63   Pulse 68   Temp (!) 97.1 F (36.2 C) (Oral)   Ht 5\' 4"  (1.626 m)   Wt 214 lb (97.1 kg)   BMI 36.73 kg/m   hgba1c 6.9%    Assessment & Plan:  LATRELLE FUSTON comes in today with chief complaint of Medical Management of Chronic Issues   Diagnosis and orders addressed:  1. Essential hypertension Low sodium diet  2. Type 2 diabetes mellitus without complication, without long-term current use of insulin (HCC) Continue to watch carbs in diet Keep follo wup with dr. Debbora Presto  3. Age-related  osteoporosis without current pathological fracture weight bearing exercises encouraged  4. Class 2 severe obesity due to excess calories with serious comorbidity and body mass index (BMI) of 38.0 to 38.9 in adult [facility]) Discussed diet and exercise for person with BMI >25 Will recheck weight in 3-6 months  5. Hyperlipidemia, unspecified hyperlipidemia type Low fat diet - atorvastatin (LIPITOR) 10 MG tablet; TAKE 1 TABLET BY MOUTH  DAILY AT 6PM.  Dispense: 90 tablet; Refill: 1  6. Murmur Keep follow up with dr. Warren Lacy  7. Essential tremor -ativan 0.5mg  1 bo bid prn  Labs pending Health Maintenance reviewed Diet and exercise encouraged  Follow up plan: 6 months   Mary-Margaret Hassell Done, FNP

## 2018-10-09 NOTE — Patient Instructions (Signed)
Bone Health Bones protect organs, store calcium, and anchor muscles. Good health habits, such as eating nutritious foods and exercising regularly, are important for maintaining healthy bones. They can also help to prevent a condition that causes bones to lose density and become weak and brittle (osteoporosis). Why is bone mass important? Bone mass refers to the amount of bone tissue that you have. The higher your bone mass, the stronger your bones. An important step toward having healthy bones throughout life is to have strong and dense bones during childhood. A young adult who has a high bone mass is more likely to have a high bone mass later in life. Bone mass at its greatest it is called peak bone mass. A large decline in bone mass occurs in older adults. In women, it occurs about the time of menopause. During this time, it is important to practice good health habits, because if more bone is lost than what is replaced, the bones will become less healthy and more likely to break (fracture). If you find that you have a low bone mass, you may be able to prevent osteoporosis or further bone loss by changing your diet and lifestyle. How can I find out if my bone mass is low? Bone mass can be measured with an X-ray test that is called a bone mineral density (BMD) test. This test is recommended for all women who are age 65 or older. It may also be recommended for men who are age 70 or older, or for people who are more likely to develop osteoporosis due to:  Having bones that break easily.  Having a long-term disease that weakens bones, such as kidney disease or rheumatoid arthritis.  Having menopause earlier than normal.  Taking medicine that weakens bones, such as steroids, thyroid hormones, or hormone treatment for breast cancer or prostate cancer.  Smoking.  Drinking three or more alcoholic drinks each day.  What are the nutritional recommendations for healthy bones? To have healthy bones, you  need to get enough of the right minerals and vitamins. Most nutrition experts recommend getting these nutrients from the foods that you eat. Nutritional recommendations vary from person to person. Ask your health care provider what is healthy for you. Here are some general guidelines. Calcium Recommendations Calcium is the most important (essential) mineral for bone health. Most people can get enough calcium from their diet, but supplements may be recommended for people who are at risk for osteoporosis. Good sources of calcium include:  Dairy products, such as low-fat or nonfat milk, cheese, and yogurt.  Dark green leafy vegetables, such as bok choy and broccoli.  Calcium-fortified foods, such as orange juice, cereal, bread, soy beverages, and tofu products.  Nuts, such as almonds.  Follow these recommended amounts for daily calcium intake:  Children, age 1?3: 700 mg.  Children, age 4?8: 1,000 mg.  Children, age 9?13: 1,300 mg.  Teens, age 14?18: 1,300 mg.  Adults, age 19?50: 1,000 mg.  Adults, age 51?70: ? Men: 1,000 mg. ? Women: 1,200 mg.  Adults, age 71 or older: 1,200 mg.  Pregnant and breastfeeding females: ? Teens: 1,300 mg. ? Adults: 1,000 mg.  Vitamin D Recommendations Vitamin D is the most essential vitamin for bone health. It helps the body to absorb calcium. Sunlight stimulates the skin to make vitamin D, so be sure to get enough sunlight. If you live in a cold climate or you do not get outside often, your health care provider may recommend that you take vitamin   D supplements. Good sources of vitamin D in your diet include:  Egg yolks.  Saltwater fish.  Milk and cereal fortified with vitamin D.  Follow these recommended amounts for daily vitamin D intake:  Children and teens, age 1?18: 600 international units.  Adults, age 50 or younger: 400-800 international units.  Adults, age 51 or older: 800-1,000 international units.  Other Nutrients Other nutrients  for bone health include:  Phosphorus. This mineral is found in meat, poultry, dairy foods, nuts, and legumes. The recommended daily intake for adult men and adult women is 700 mg.  Magnesium. This mineral is found in seeds, nuts, dark green vegetables, and legumes. The recommended daily intake for adult men is 400?420 mg. For adult women, it is 310?320 mg.  Vitamin K. This vitamin is found in green leafy vegetables. The recommended daily intake is 120 mg for adult men and 90 mg for adult women.  What type of physical activity is best for building and maintaining healthy bones? Weight-bearing and strength-building activities are important for building and maintaining peak bone mass. Weight-bearing activities cause muscles and bones to work against gravity. Strength-building activities increases muscle strength that supports bones. Weight-bearing and muscle-building activities include:  Walking and hiking.  Jogging and running.  Dancing.  Gym exercises.  Lifting weights.  Tennis and racquetball.  Climbing stairs.  Aerobics.  Adults should get at least 30 minutes of moderate physical activity on most days. Children should get at least 60 minutes of moderate physical activity on most days. Ask your health care provide what type of exercise is best for you. Where can I find more information? For more information, check out the following websites:  National Osteoporosis Foundation: http://nof.org/learn/basics  National Institutes of Health: http://www.niams.nih.gov/Health_Info/Bone/Bone_Health/bone_health_for_life.asp  This information is not intended to replace advice given to you by your health care provider. Make sure you discuss any questions you have with your health care provider. Document Released: 01/26/2004 Document Revised: 05/25/2016 Document Reviewed: 11/10/2014 Elsevier Interactive Patient Education  2018 Elsevier Inc.  

## 2018-10-13 ENCOUNTER — Other Ambulatory Visit: Payer: Self-pay

## 2018-10-13 ENCOUNTER — Encounter: Payer: Self-pay | Admitting: Thoracic Surgery (Cardiothoracic Vascular Surgery)

## 2018-10-13 ENCOUNTER — Ambulatory Visit: Payer: Medicare Other | Admitting: Thoracic Surgery (Cardiothoracic Vascular Surgery)

## 2018-10-13 VITALS — BP 150/82 | HR 84 | Resp 18 | Ht 64.0 in | Wt 214.0 lb

## 2018-10-13 DIAGNOSIS — Z953 Presence of xenogenic heart valve: Secondary | ICD-10-CM | POA: Diagnosis not present

## 2018-10-13 NOTE — Progress Notes (Signed)
[address]       [place], [address]             [phone]     CARDIOTHORACIC SURGERY OFFICE NOTE  Referring Provider is [name], MD PCP is [name], FNP   HPI:  Patient is a 73 year old obese female with history of bicuspid aortic valve and severe aortic stenosis, hypertension, insulin-dependent type 2 diabetes mellitus, and osteoporosis who returns to the office today for routine follow-up 1 year status post aortic valve replacement using a stented bovine pericardial tissue valve via partial upper mini sternotomy on October 16, 2017.    Her postoperative recovery was uneventful and she was last seen here in our office on January 13, 2018 at which time she was doing well and participating in the cardiac rehab program.  Since then she underwent routine follow-up echocardiogram on January 28, 2018 which revealed normal left ventricular function and normal functioning bioprosthetic tissue valve in aortic position.  Peak velocity across the aortic valve was reported 2.1 m/s corresponding to mean transvalvular gradient estimated 10 mmHg.  There was no paravalvular leak.  Since then the patient has been followed by Dr. Percival Spanish and she returns to our office for routine follow-up today.  She reports that she is doing well.  Overall she reports that she is "much improved" in comparison with how she felt prior to surgery.  Prior surgery she had considerable trouble with exertional shortness of breath.  She now states that she hardly ever gets short of breath and only with very strenuous exertion.  She has no exertional chest pain or chest tightness.  Her activity level is much better than it used to be.  Overall she is delighted with her progress.   Current Outpatient Medications  Medication Sig Dispense Refill  . acetaminophen (TYLENOL) 500 MG tablet Take 500 mg 2 (two) times daily as needed by mouth for moderate pain or headache.     . alendronate  (FOSAMAX) 70 MG tablet Take 1 tablet (70 mg total) by mouth once a week. Take with a full glass of water on an empty stomach. 12 tablet 1  . Ascorbic Acid (VITAMIN C) 1000 MG tablet Take 1,000 mg daily by mouth.    [name] aspirin 81 MG EC tablet Take 1 tablet (81 mg total) by mouth daily.    [name] atorvastatin (LIPITOR) 10 MG tablet TAKE 1 TABLET BY MOUTH  DAILY AT 6PM. 90 tablet 1  . beta carotene w/minerals (OCUVITE) tablet Take 1 tablet by mouth daily.    . calcium carbonate (TUMS - DOSED IN MG ELEMENTAL CALCIUM) 500 MG chewable tablet Chew 1 tablet by mouth daily as needed for indigestion or heartburn.    . calcium-vitamin D (OSCAL 500/200 D-3) 500-200 MG-UNIT tablet Take 1 tablet by mouth 2 (two) times daily.    . cholecalciferol (VITAMIN D) 1000 UNITS tablet Take 1,000 Units by mouth daily.    . fluticasone (FLONASE) 50 MCG/ACT nasal spray Place 1 spray into both nostrils daily as needed for allergies or rhinitis.    [name] HUMALOG 100 UNIT/ML injection Inject 6-20 Units into the skin 3 (three) times daily with meals. Using sliding scale.    . hydrochlorothiazide (HYDRODIURIL) 25 MG tablet TAKE 1 TABLET BY MOUTH  DAILY 90 tablet 0  . LANTUS 100 UNIT/ML injection Inject 35 Units into the skin at bedtime.     [name] lisinopril (PRINIVIL,ZESTRIL) 20 MG tablet Take 1 tablet (20 mg  total) by mouth daily. 90 tablet 3  . LORazepam (ATIVAN) 0.5 MG tablet Take 1 tablet (0.5 mg total) by mouth 2 (two) times daily as needed for anxiety. 60 tablet 1  . Magnesium 250 MG TABS Take 250 mg by mouth daily.     . metFORMIN (GLUCOPHAGE-XR) 500 MG 24 hr tablet TAKE 1 TABLET BY MOUTH TWO  TIMES DAILY 180 tablet 0  . metoprolol tartrate (LOPRESSOR) 12.5 mg TABS tablet Take 12.5 mg by mouth 2 (two) times daily.    . Multiple Vitamins-Minerals (MULTI-BETIC DIABETES) TABS Take 1 tablet daily by mouth.    . pyridOXINE (VITAMIN B-6) 100 MG tablet Take 100 mg by mouth daily.    [name] TEST test strip Test four times daily.     No  current facility-administered medications for this visit.       Physical Exam:   BP (!) 150/82 (BP Location: Right Arm, Patient Position: Sitting, Cuff Size: Normal)   Pulse 84   Resp 18   Ht 5\' 4"  (1.626 m)   Wt 214 lb (97.1 kg)   SpO2 94% Comment: RA  BMI 36.73 kg/m   General:  Well-appearing  Chest:   Clear to auscultation  CV:   Regular rate and rhythm without murmur  Incisions:  Completely healed  Abdomen:  Soft nontender  Extremities:  Warm and well-perfused  Diagnostic Tests:  Transthoracic Echocardiography  Patient:    Toni Cooper, Toni Cooper MR #:       [id number] Study Date: 01/28/2018 Gender:     F Age:        19 Height:     162.6 cm Weight:     92.5 kg BSA:        2.08 m^2 Pt. Status: Room:   ATTENDING    Barrett, Nikki Dom     Barrett, Evelene Croon  REFERRING    Barrett, Evelene Croon  SONOGRAPHER  Wyatt Mage, RDCS  PERFORMING   [facility], Outpatient  cc:  ------------------------------------------------------------------- LV EF: 65% -   70%  ------------------------------------------------------------------- Indications:      Aortic stenosis (I35).  ------------------------------------------------------------------- History:   PMH:   Murmur.  Risk factors:  Palpitation. Hypertension. Diabetes mellitus. Obese. Dyslipidemia.  ------------------------------------------------------------------- Study Conclusions  - Left ventricle: The cavity size was normal. Wall thickness was   increased in a pattern of mild LVH. Systolic function was   vigorous. The estimated ejection fraction was in the range of 65%   to 70%. Wall motion was normal; there were no regional wall   motion abnormalities. Doppler parameters are consistent with   abnormal left ventricular relaxation (grade 1 diastolic   dysfunction). Doppler parameters are consistent with high   ventricular filling pressure. - Aortic valve: A bioprosthesis was present.  Impressions:  - Vigorous LV  systolic function; mild LVH; mild diastolic   dysfunction; elevated LV filling pressure; s/p AVR with normal   gradient and no AI.  ------------------------------------------------------------------- Labs, prior tests, procedures, and surgery: Transthoracic echocardiography (04/09/2017).    The aortic valve showed critical stenosis.  EF was 60% and PA pressure was 27 (systolic). Aortic valve: peak gradient of 96 mm Hg and mean gradient of 59 mm Hg.  Valve surgery.     Aortic valve replacement with a bioprosthetic valve.  ------------------------------------------------------------------- Study data:  Comparison was made to the study of 04/09/2017.  Study status:  Routine.  Procedure:  The patient reported no pain pre or post test. Transthoracic echocardiography. Image quality was adequate.  Study completion:  There were no complications. Transthoracic echocardiography.  M-mode, complete 2D, spectral Doppler, and color Doppler.  Birthdate:  Patient birthdate: 05-23-1946.  Age:  Patient is 72 yr old.  Sex:  Gender: female. BMI: 35 kg/m^2.  Blood pressure:     150/70  Patient status: Outpatient.  Study date:  Study date: 01/28/2018. Study time: 01:23 PM.  Location:  [facility]  -------------------------------------------------------------------  ------------------------------------------------------------------- Left ventricle:  The cavity size was normal. Wall thickness was increased in a pattern of mild LVH. Systolic function was vigorous. The estimated ejection fraction was in the range of 65% to 70%. Wall motion was normal; there were no regional wall motion abnormalities. Doppler parameters are consistent with abnormal left ventricular relaxation (grade 1 diastolic dysfunction). Doppler parameters are consistent with high ventricular filling pressure.   ------------------------------------------------------------------- Aortic valve:  A bioprosthesis was present.  Mobility was not restricted.  Doppler:  Transvalvular velocity was within the normal range. There was no stenosis. There was no regurgitation.    VTI ratio of LVOT to aortic valve: 0.6. Peak velocity ratio of LVOT to aortic valve: 0.57. Mean velocity ratio of LVOT to aortic valve: 0.58.    Mean gradient (S): 10 mm Hg. Peak gradient (S): 18 mm Hg.   ------------------------------------------------------------------- Aorta:  Aortic root: The aortic root was normal in size.  ------------------------------------------------------------------- Mitral valve:   Structurally normal valve.   Mobility was not restricted.  Doppler:  Transvalvular velocity was within the normal range. There was no evidence for stenosis. There was trivial regurgitation.    Valve area by pressure half-time: 4.15 cm^2. Indexed valve area by pressure half-time: 1.99 cm^2/m^2.    Peak gradient (D): 5 mm Hg.  ------------------------------------------------------------------- Left atrium:  The atrium was normal in size.  ------------------------------------------------------------------- Right ventricle:  The cavity size was normal. Systolic function was normal.  ------------------------------------------------------------------- Pulmonic valve:    Doppler:  Transvalvular velocity was within the normal range. There was no evidence for stenosis. There was trivial regurgitation.  ------------------------------------------------------------------- Tricuspid valve:   Structurally normal valve.    Doppler: Transvalvular velocity was within the normal range. There was no regurgitation.  ------------------------------------------------------------------- Right atrium:  The atrium was normal in size.  ------------------------------------------------------------------- Pericardium:  There was no pericardial effusion.  ------------------------------------------------------------------- Measurements   Left  ventricle                         Value          Reference  LV ID, ED, PLAX chordal                49    mm       43 - 52  LV ID, ES, PLAX chordal                34    mm       23 - 38  LV fx shortening, PLAX chordal         31    %        >=29  LV PW thickness, ED                    12    mm       ---------  IVS/LV PW ratio, ED                    1.08           <=  1.3  LV e&', lateral                         9.28  cm/s     ---------  LV E/e&', lateral                       11.53          ---------  LV e&', medial                          5.22  cm/s     ---------  LV E/e&', medial                        20.5           ---------  LV e&', average                         7.25  cm/s     ---------  LV E/e&', average                       14.76          ---------    Ventricular septum                     Value          Reference  IVS thickness, ED                      13    mm       ---------    LVOT                                   Value          Reference  LVOT peak velocity, S                  123   cm/s     ---------  LVOT mean velocity, S                  84.9  cm/s     ---------  LVOT VTI, S                            29.5  cm       ---------  LVOT peak gradient, S                  6     mm Hg    ---------    Aortic valve                           Value          Reference  Aortic valve peak velocity, S          214   cm/s     ---------  Aortic valve mean velocity, S          147   cm/s     ---------  Aortic valve VTI, S                    49.5  cm       ---------  Aortic mean gradient, S                10    mm Hg    ---------  Aortic peak gradient, S                18    mm Hg    ---------  VTI ratio, LVOT/AV                     0.6            ---------  Velocity ratio, peak, LVOT/AV          0.57           ---------  Velocity ratio, mean, LVOT/AV          0.58           ---------    Aorta                                  Value          Reference  Aortic root ID, ED                      32    mm       ---------  Ascending aorta ID, A-P, S             32    mm       ---------    Left atrium                            Value          Reference  LA ID, A-P, ES                         47    mm       ---------  LA ID/bsa, A-P                 (H)     2.26  cm/m^2   <=2.2  LA volume, S                           58.3  ml       ---------  LA volume/bsa, S                       28    ml/m^2   ---------  LA volume, ES, 1-p A4C                 59.2  ml       ---------  LA volume/bsa, ES, 1-p A4C             28.4  ml/m^2   ---------  LA volume, ES, 1-p A2C                 55.7  ml       ---------  LA volume/bsa, ES, 1-p A2C             26.7  ml/m^2   ---------    Mitral valve                           Value  Reference  Mitral E-wave peak velocity            107   cm/s     ---------  Mitral A-wave peak velocity            113   cm/s     ---------  Mitral deceleration time               180   ms       150 - 230  Mitral pressure half-time              53    ms       ---------  Mitral peak gradient, D                5     mm Hg    ---------  Mitral E/A ratio, peak                 0.9            ---------  Mitral valve area, PHT, DP             4.15  cm^2     ---------  Mitral valve area/bsa, PHT, DP         1.99  cm^2/m^2 ---------    Systemic veins                         Value          Reference  Estimated CVP                          3     mm Hg    ---------    Right ventricle                        Value          Reference  TAPSE                                  23.4  mm       ---------  RV s&', lateral, S                      6.8   cm/s     ---------  Legend: (L)  and  (H)  mark values outside specified reference range.  ------------------------------------------------------------------- Prepared and Electronically Authenticated by  Kirk Ruths 2019-03-12T14:15:26   Impression:  Patient is doing well approximately 1 year status post aortic valve  replacement using a bioprosthetic tissue valve  Plan:  We have not recommended any change the patient's current medications.  The patient will continue to follow-up with Dr. Percival Spanish and return to our office in the future only should specific problems or questions arise.  The patient has been reminded regarding the importance of dental hygiene and the lifelong need for antibiotic prophylaxis for all dental cleanings and other related invasive procedures.   I spent in excess of 10 minutes during the conduct of this office consultation and >50% of this time involved direct face-to-face encounter with the patient for counseling and/or coordination of their care.   [name]. [name], MD 10/13/2018 2:06 PM

## 2018-10-13 NOTE — Patient Instructions (Signed)

## 2018-10-27 NOTE — Progress Notes (Signed)
Cardiology Office Note   Date:  10/31/2018   ID:  Toni Cooper, DOB 1945-12-24, MRN [id number]  PCP:  [name], FNP  Cardiologist:   No primary care provider on file.   No chief complaint on file.     History of Present Illness: Toni Cooper is a 72 y.o. female who presents for follow up after SAVR.   Since I last saw her.  She is done well.  She has been released from Dr. Guy Sandifer care.  She has a little sternal discomfort when the weather changes.  However, she admits that she breathes much better since her surgery.  She is doing some biking 30 minutes on a stationary every day. The patient denies any new symptoms such as chest discomfort, neck or arm discomfort. There has been no new shortness of breath, PND or orthopnea. There have been no reported palpitations, presyncope or syncope. .   Past Medical History:  Diagnosis Date  . Aortic stenosis, severe 2015  . Cataract   . Diabetes mellitus without complication ([place])   . Dyspnea   . Hypertension   . Obesity   . Osteoporosis   . S/P minimally invasive aortic valve replacement with bioprosthetic valve 10/16/2017   23 mm Edwards Intuity Elite rapid deployment bovine stented bioprosthetic tissue valve via partial upper sternotomy    Past Surgical History:  Procedure Laterality Date  . ABDOMINAL HYSTERECTOMY    . APPENDECTOMY    . FRACTURE SURGERY     shoulder  . TEE WITHOUT CARDIOVERSION N/A 04/18/2016   Procedure: TRANSESOPHAGEAL ECHOCARDIOGRAM (TEE);  Surgeon: Thayer Headings, MD;  Location: [facility];  Service: Cardiovascular;  Laterality: N/A;  . TEE WITHOUT CARDIOVERSION N/A 10/16/2017   Procedure: TRANSESOPHAGEAL ECHOCARDIOGRAM (TEE);  Surgeon: Rexene Alberts, MD;  Location: [facility];  Service: Open Heart Surgery;  Laterality: N/A;  . WRIST SURGERY       Current Outpatient Medications  Medication Sig Dispense Refill  . acetaminophen (TYLENOL) 500 MG tablet Take 500 mg 2 (two) times daily as needed by  mouth for moderate pain or headache.     . alendronate (FOSAMAX) 70 MG tablet Take 1 tablet (70 mg total) by mouth once a week. Take with a full glass of water on an empty stomach. 12 tablet 1  . Ascorbic Acid (VITAMIN C) 1000 MG tablet Take 1,000 mg daily by mouth.    [name] aspirin 81 MG EC tablet Take 1 tablet (81 mg total) by mouth daily.    [name] atorvastatin (LIPITOR) 10 MG tablet TAKE 1 TABLET BY MOUTH  DAILY AT 6PM. 90 tablet 1  . beta carotene w/minerals (OCUVITE) tablet Take 1 tablet by mouth daily.    . calcium carbonate (TUMS - DOSED IN MG ELEMENTAL CALCIUM) 500 MG chewable tablet Chew 1 tablet by mouth daily as needed for indigestion or heartburn.    . calcium-vitamin D (OSCAL 500/200 D-3) 500-200 MG-UNIT tablet Take 1 tablet by mouth 2 (two) times daily.    . cholecalciferol (VITAMIN D) 1000 UNITS tablet Take 1,000 Units by mouth daily.    . fluticasone (FLONASE) 50 MCG/ACT nasal spray Place 1 spray into both nostrils daily as needed for allergies or rhinitis.    [name] HUMALOG 100 UNIT/ML injection Inject 6-20 Units into the skin 3 (three) times daily with meals. Using sliding scale.    . hydrochlorothiazide (HYDRODIURIL) 25 MG tablet TAKE 1 TABLET BY MOUTH  DAILY 90 tablet 0  . LANTUS 100  UNIT/ML injection Inject 35 Units into the skin at bedtime.     [name] lisinopril (PRINIVIL,ZESTRIL) 20 MG tablet Take 1 tablet (20 mg total) by mouth daily. 90 tablet 3  . LORazepam (ATIVAN) 0.5 MG tablet Take 1 tablet (0.5 mg total) by mouth 2 (two) times daily as needed for anxiety. 60 tablet 1  . Magnesium 250 MG TABS Take 250 mg by mouth daily.     . metFORMIN (GLUCOPHAGE-XR) 500 MG 24 hr tablet TAKE 1 TABLET BY MOUTH TWO  TIMES DAILY 180 tablet 0  . metoprolol tartrate (LOPRESSOR) 12.5 mg TABS tablet Take 12.5 mg by mouth 2 (two) times daily.    . Multiple Vitamins-Minerals (MULTI-BETIC DIABETES) TABS Take 1 tablet daily by mouth.    . pyridOXINE (VITAMIN B-6) 100 MG tablet Take 100 mg by mouth daily.    [name] TEST test strip Test four times daily.     No current facility-administered medications for this visit.     Allergies:   Sulfa antibiotics    ROS:  Please see the history of present illness.   Otherwise, review of systems are positive for none.   All other systems are reviewed and negative.    PHYSICAL EXAM: VS:  BP (!) 141/67   Pulse 72   Ht 5\' 4"  (1.626 m)   Wt 218 lb 3.2 oz (99 kg)   BMI 37.45 kg/m  , BMI Body mass index is 37.45 kg/m.  GENERAL:  Well appearing NECK:  No jugular venous distention, waveform within normal limits, carotid upstroke brisk and symmetric, no bruits, no thyromegaly LUNGS:  Clear to auscultation bilaterally CHEST:  Unremarkable HEART:  PMI not displaced or sustained,S1 and S2 within normal limits, no S3, no S4, no clicks, no rubs, 2 out of 6 brief apical systolic murmur not radiating, no diastolic murmurs ABD:  Flat, positive bowel sounds normal in frequency in pitch, no bruits, no rebound, no guarding, no midline pulsatile mass, no hepatomegaly, no splenomegaly EXT:  2 plus pulses throughout, no edema, no cyanosis no clubbing   EKG:  EKG is  ordered today. Sinus rhythm, rate 72, leftward axis deviation, poor anterior R wave progression, no acute ST-T wave changes.   Recent Labs: No results found for requested labs within last 8760 hours.    Lipid Panel    Component Value Date/Time   CHOL 98 (L) 04/04/2017 0830   TRIG 54 04/04/2017 0830   TRIG 74 02/25/2014   HDL 38 (L) 04/04/2017 0830   CHOLHDL 2.6 04/04/2017 0830   LDLCALC 49 04/04/2017 0830     Lab Results  Component Value Date   HGBA1C 6.9 (H) 10/14/2017     Wt Readings from Last 3 Encounters:  10/31/18 218 lb 3.2 oz (99 kg)  10/13/18 214 lb (97.1 kg)  10/09/18 214 lb (97.1 kg)      Other studies Reviewed: Additional studies/ records that were reviewed today include: None. Review of the above records demonstrates:     ASSESSMENT AND PLAN:  SAVR:   She is  doing well related to this.  No change in therapy is indicated.  OVERWEIGHT:   We again discussed the need to lose weight.  She stopped eating fried food.  DM:  A1C was 6.7.    This is followed by [name], FNP   HTN:    The blood pressure is at target.  No change in therapy.    Current medicines are reviewed at length with the patient  today.  The patient does not have concerns regarding medicines.  The following changes have been made:  None  Labs/ tests ordered today include: None   Orders Placed This Encounter  Procedures  . EKG 12-Lead     Disposition:   FU with me in 12 months.     Signed, [name], MD  10/31/2018 10:43 AM    [facility]

## 2018-10-31 ENCOUNTER — Ambulatory Visit: Payer: Medicare Other | Admitting: Cardiology

## 2018-10-31 ENCOUNTER — Encounter: Payer: Self-pay | Admitting: Cardiology

## 2018-10-31 VITALS — BP 141/67 | HR 72 | Ht 64.0 in | Wt 218.2 lb

## 2018-10-31 DIAGNOSIS — Z953 Presence of xenogenic heart valve: Secondary | ICD-10-CM | POA: Diagnosis not present

## 2018-10-31 DIAGNOSIS — E785 Hyperlipidemia, unspecified: Secondary | ICD-10-CM

## 2018-10-31 DIAGNOSIS — I10 Essential (primary) hypertension: Secondary | ICD-10-CM | POA: Diagnosis not present

## 2018-10-31 MED ORDER — LISINOPRIL 20 MG PO TABS: 20.0000 mg | ORAL_TABLET | Freq: Every day | ORAL | 3 refills | Status: DC

## 2018-10-31 MED ORDER — ATORVASTATIN CALCIUM 10 MG PO TABS: ORAL_TABLET | ORAL | 3 refills | Status: DC

## 2018-10-31 MED ORDER — METOPROLOL TARTRATE 25 MG PO TABS: 12.5000 mg | ORAL_TABLET | Freq: Two times a day (BID) | ORAL | 3 refills | Status: DC

## 2018-10-31 MED ORDER — HYDROCHLOROTHIAZIDE 25 MG PO TABS: 25.0000 mg | ORAL_TABLET | Freq: Every day | ORAL | 3 refills | Status: DC

## 2018-10-31 NOTE — Patient Instructions (Signed)
Medication Instructions:  Continue current medications  If you need a refill on your cardiac medications before your next appointment, please call your pharmacy.  Labwork: None Ordered   If you have labs (blood work) drawn today and your tests are completely normal, you will receive your results only by [employer] (if you have MyChart) -OR- A paper copy in the mail.  If you have any lab test that is abnormal or we need to change your treatment, we will call you to review these results.  Testing/Procedures: None Ordered  Follow-Up: You will need a follow up appointment in 12 months.  Please call our office 2 months in advance to schedule this appointment.  You may see Dr Percival Spanish or one of the following Advanced Practice Providers on your designated Care Team:   Rosaria Ferries, PA-C . Jory Sims, DNP, ANP   At [facility], you and your health needs are our priority.  As part of our continuing mission to provide you with exceptional heart care, we have created designated Provider Care Teams.  These Care Teams include your primary Cardiologist (physician) and Advanced Practice Providers (APPs -  Physician Assistants and Nurse Practitioners) who all work together to provide you with the care you need, when you need it.

## 2018-10-31 NOTE — Addendum Note (Signed)
Addended by: Vennie Homans on: 10/31/2018 11:15 AM   Modules accepted: Orders

## 2018-12-10 ENCOUNTER — Telehealth: Payer: Self-pay | Admitting: Cardiology

## 2018-12-10 MED ORDER — AMOXICILLIN 500 MG PO TABS: ORAL_TABLET | ORAL | 1 refills | Status: DC

## 2018-12-10 NOTE — Telephone Encounter (Signed)
Spoke with patient and she has taken Amoxil 500 mg previously that her dentist gave her. He has retired so she is needing new Rx. Discussed with Doreene Burke PA and ok to give SBE prophylaxis Amoxil 500 mg 4 tablets 1 hour prior to dental work. Advised patient this has been sent to [facility]

## 2018-12-10 NOTE — Telephone Encounter (Signed)
New message   Patient states that Dr. Percival Spanish advised her that she would need antibiotics before any dental work. The dentist will not give her antibiotics. She wants to see if it can be prescribed with Dr. Percival Spanish.

## 2019-02-14 ENCOUNTER — Other Ambulatory Visit: Payer: Self-pay | Admitting: Nurse Practitioner

## 2019-04-08 ENCOUNTER — Other Ambulatory Visit: Payer: Self-pay

## 2019-04-09 ENCOUNTER — Encounter: Payer: Self-pay | Admitting: Nurse Practitioner

## 2019-04-09 ENCOUNTER — Ambulatory Visit: Payer: Medicare Other | Admitting: Nurse Practitioner

## 2019-04-09 VITALS — BP 131/60 | HR 75 | Temp 98.3°F | Ht 64.0 in | Wt 220.0 lb

## 2019-04-09 DIAGNOSIS — E785 Hyperlipidemia, unspecified: Secondary | ICD-10-CM

## 2019-04-09 DIAGNOSIS — Z6838 Body mass index (BMI) 38.0-38.9, adult: Secondary | ICD-10-CM

## 2019-04-09 DIAGNOSIS — I10 Essential (primary) hypertension: Secondary | ICD-10-CM | POA: Diagnosis not present

## 2019-04-09 DIAGNOSIS — R011 Cardiac murmur, unspecified: Secondary | ICD-10-CM

## 2019-04-09 DIAGNOSIS — E119 Type 2 diabetes mellitus without complications: Secondary | ICD-10-CM

## 2019-04-09 DIAGNOSIS — Z953 Presence of xenogenic heart valve: Secondary | ICD-10-CM

## 2019-04-09 DIAGNOSIS — M810 Age-related osteoporosis without current pathological fracture: Secondary | ICD-10-CM

## 2019-04-09 MED ORDER — ATORVASTATIN CALCIUM 10 MG PO TABS: ORAL_TABLET | ORAL | 3 refills | Status: DC

## 2019-04-09 MED ORDER — HYDROCHLOROTHIAZIDE 25 MG PO TABS: 25.0000 mg | ORAL_TABLET | Freq: Every day | ORAL | 3 refills | Status: DC

## 2019-04-09 MED ORDER — METFORMIN HCL ER 500 MG PO TB24: 500.0000 mg | ORAL_TABLET | Freq: Two times a day (BID) | ORAL | 1 refills | Status: DC

## 2019-04-09 MED ORDER — LISINOPRIL 20 MG PO TABS: 20.0000 mg | ORAL_TABLET | Freq: Every day | ORAL | 3 refills | Status: DC

## 2019-04-09 MED ORDER — ALENDRONATE SODIUM 70 MG PO TABS: ORAL_TABLET | ORAL | 1 refills | Status: DC

## 2019-04-09 NOTE — Patient Instructions (Signed)
Diabetes Mellitus and Foot Care  Foot care is an important part of your health, especially when you have diabetes. Diabetes may cause you to have problems because of poor blood flow (circulation) to your feet and legs, which can cause your skin to:   Become thinner and drier.   Break more easily.   Heal more slowly.   Peel and crack.  You may also have nerve damage (neuropathy) in your legs and feet, causing decreased feeling in them. This means that you may not notice minor injuries to your feet that could lead to more serious problems. Noticing and addressing any potential problems early is the best way to prevent future foot problems.  How to care for your feet  Foot hygiene   Wash your feet daily with warm water and mild soap. Do not use hot water. Then, pat your feet and the areas between your toes until they are completely dry. Do not soak your feet as this can dry your skin.   Trim your toenails straight across. Do not dig under them or around the cuticle. File the edges of your nails with an emery board or nail file.   Apply a moisturizing lotion or petroleum jelly to the skin on your feet and to dry, brittle toenails. Use lotion that does not contain alcohol and is unscented. Do not apply lotion between your toes.  Shoes and socks   Wear clean socks or stockings every day. Make sure they are not too tight. Do not wear knee-high stockings since they may decrease blood flow to your legs.   Wear shoes that fit properly and have enough cushioning. Always look in your shoes before you put them on to be sure there are no objects inside.   To break in new shoes, wear them for just a few hours a day. This prevents injuries on your feet.  Wounds, scrapes, corns, and calluses   Check your feet daily for blisters, cuts, bruises, sores, and redness. If you cannot see the bottom of your feet, use a mirror or ask someone for help.   Do not cut corns or calluses or try to remove them with medicine.   If you  find a minor scrape, cut, or break in the skin on your feet, keep it and the skin around it clean and dry. You may clean these areas with mild soap and water. Do not clean the area with peroxide, alcohol, or iodine.   If you have a wound, scrape, corn, or callus on your foot, look at it several times a day to make sure it is healing and not infected. Check for:  ? Redness, swelling, or pain.  ? Fluid or blood.  ? Warmth.  ? Pus or a bad smell.  General instructions   Do not cross your legs. This may decrease blood flow to your feet.   Do not use heating pads or hot water bottles on your feet. They may burn your skin. If you have lost feeling in your feet or legs, you may not know this is happening until it is too late.   Protect your feet from hot and cold by wearing shoes, such as at the beach or on hot pavement.   Schedule a complete foot exam at least once a year (annually) or more often if you have foot problems. If you have foot problems, report any cuts, sores, or bruises to your health care provider immediately.  Contact a health care provider if:     You have a medical condition that increases your risk of infection and you have any cuts, sores, or bruises on your feet.   You have an injury that is not healing.   You have redness on your legs or feet.   You feel burning or tingling in your legs or feet.   You have pain or cramps in your legs and feet.   Your legs or feet are numb.   Your feet always feel cold.   You have pain around a toenail.  Get help right away if:   You have a wound, scrape, corn, or callus on your foot and:  ? You have pain, swelling, or redness that gets worse.  ? You have fluid or blood coming from the wound, scrape, corn, or callus.  ? Your wound, scrape, corn, or callus feels warm to the touch.  ? You have pus or a bad smell coming from the wound, scrape, corn, or callus.  ? You have a fever.  ? You have a red line going up your leg.  Summary   Check your feet every day  for cuts, sores, red spots, swelling, and blisters.   Moisturize feet and legs daily.   Wear shoes that fit properly and have enough cushioning.   If you have foot problems, report any cuts, sores, or bruises to your health care provider immediately.   Schedule a complete foot exam at least once a year (annually) or more often if you have foot problems.  This information is not intended to replace advice given to you by your health care provider. Make sure you discuss any questions you have with your health care provider.  Document Released: 11/02/2000 Document Revised: 12/18/2017 Document Reviewed: 12/07/2016  Elsevier Interactive Patient Education  2019 [employer]

## 2019-04-09 NOTE — Progress Notes (Signed)
Subjective:    Patient ID: Toni Cooper, female    DOB: [birth year], 73 y.o.   MRN: [id number]   Chief Complaint: Medical Management of Chronic Issues    HPI:  1. Essential hypertension No c/o chest pain, sob or headache. Does not check blood pressure at home. BP Readings from Last 3 Encounters:  04/09/19 131/60  10/31/18 (!) 141/67  10/13/18 (!) 150/82     2. Hyperlipidemia, unspecified hyperlipidemia type Does not watch diet and does very little exercise  3. Type 2 diabetes mellitus without complication, without long-term current use of insulin (HCC) Last hgab1c was 7.2%. blood sugars are up and down. See DR. Balan and is currently trying to get the libre glucose monitor.  4. S/P minimally invasive aortic valve replacement with bioprosthetic valve Has been doing well since her valve surgery.  5. Murmur No problems  6. Age-related osteoporosis without current pathological fracture Last dexascan was done 11/28/16 with tscore of -3.4 she does no weight bearing exercises. She is currently on fosamax.  7. Class 2 severe obesity due to excess calories with serious comorbidity and body mass index (BMI) of 38.0 to 38.9 in adult [facility]) No recent weight changes.    Outpatient Encounter Medications as of 04/09/2019  Medication Sig  . acetaminophen (TYLENOL) 500 MG tablet Take 500 mg 2 (two) times daily as needed by mouth for moderate pain or headache.   . alendronate (FOSAMAX) 70 MG tablet TAKE 1 TABLET BY MOUTH ONCE A WEEK. TAKE WITH A FULL  GLASS OF WATER ON AN EMPTY  STOMACH.  [name] Ascorbic Acid (VITAMIN C) 1000 MG tablet Take 1,000 mg daily by mouth.  [name] aspirin 81 MG EC tablet Take 1 tablet (81 mg total) by mouth daily.  [name] atorvastatin (LIPITOR) 10 MG tablet TAKE 1 TABLET BY MOUTH  DAILY AT 6PM.  . beta carotene w/minerals (OCUVITE) tablet Take 1 tablet by mouth daily.  . calcium carbonate (TUMS - DOSED IN MG ELEMENTAL CALCIUM) 500 MG chewable tablet Chew 1 tablet by mouth daily as  needed for indigestion or heartburn.  . calcium-vitamin D (OSCAL 500/200 D-3) 500-200 MG-UNIT tablet  Take 1 tablet by mouth 2 (two) times daily.  . cholecalciferol (VITAMIN D) 1000 UNITS tablet Take 1,000 Units by mouth daily.  . fluticasone (FLONASE) 50 MCG/ACT nasal spray Place 1 spray into both nostrils daily as needed for allergies or rhinitis.  [name] HUMALOG 100 UNIT/ML injection Inject 6-20 Units into the skin 3 (three) times daily with meals. Using sliding scale.  . hydrochlorothiazide (HYDRODIURIL) 25 MG tablet Take 1 tablet (25 mg total) by mouth daily.  [name] LANTUS 100 UNIT/ML injection Inject 35 Units into the skin at bedtime.   [name] lisinopril (PRINIVIL,ZESTRIL) 20 MG tablet Take 1 tablet (20 mg total) by mouth daily.  [name] LORazepam (ATIVAN) 0.5 MG tablet Take 1 tablet (0.5 mg total) by mouth 2 (two) times daily as needed for anxiety.  . Magnesium 250 MG TABS Take 250 mg by mouth daily.   . metFORMIN (GLUCOPHAGE-XR) 500 MG 24 hr tablet TAKE 1 TABLET BY MOUTH TWO  TIMES DAILY  . Multiple Vitamins-Minerals (MULTI-BETIC DIABETES) TABS Take 1 tablet daily by mouth.  . pyridOXINE (VITAMIN B-6) 100 MG tablet Take 100 mg by mouth daily.  [name] TEST test strip Test four times daily.  [name] amoxicillin (AMOXIL) 500 MG tablet Take 4 tablets by mouth 1 hour prior to dental work (Patient not taking: Reported on 04/09/2019)  . metoprolol  tartrate (LOPRESSOR) 25 MG tablet Take 0.5 tablets (12.5 mg total) by mouth 2 (two) times daily.     New complaints: None today  Social history: Lives with husband.     Review of Systems  Constitutional: Negative for activity change and appetite change.  HENT: Negative.   Eyes: Negative for pain.  Respiratory: Negative for shortness of breath.   Cardiovascular: Negative for chest pain, palpitations and leg swelling.  Gastrointestinal: Negative for abdominal pain.  Endocrine: Negative for polydipsia.  Genitourinary: Negative.   Skin: Negative for rash.   Neurological: Negative for dizziness, weakness and headaches.  Hematological: Does not bruise/bleed easily.  Psychiatric/Behavioral: Negative.   All other systems reviewed and are negative.      Objective:   Physical Exam Vitals signs and nursing note reviewed.  Constitutional:      General: She is not in acute distress.    Appearance: Normal appearance. She is well-developed.  HENT:     Head: Normocephalic.     Nose: Nose normal.  Eyes:     Pupils: Pupils are equal, round, and reactive to light.  Neck:     Musculoskeletal: Normal range of motion and neck supple.     Vascular: No carotid bruit or JVD.  Cardiovascular:     Rate and Rhythm: Normal rate and regular rhythm.     Heart sounds: Normal heart sounds.  Pulmonary:     Effort: Pulmonary effort is normal. No respiratory distress.     Breath sounds: Normal breath sounds. No wheezing or rales.  Chest:     Chest wall: No tenderness.  Abdominal:     General: Bowel sounds are normal. There is no distension or abdominal bruit.     Palpations: Abdomen is soft. There is no hepatomegaly, splenomegaly, mass or pulsatile mass.     Tenderness: There is no abdominal tenderness.  Musculoskeletal: Normal range of motion.  Lymphadenopathy:     Cervical: No cervical adenopathy.  Skin:    General: Skin is warm and dry.  Neurological:     Mental Status: She is alert and oriented to person, place, and time.     Deep Tendon Reflexes: Reflexes are normal and symmetric.  Psychiatric:        Behavior: Behavior normal.        Thought Content: Thought content normal.        Judgment: Judgment normal.    BP 131/60   Pulse 75   Temp 98.3 F (36.8 C) (Oral)   Ht 5\' 4"  (1.626 m)   Wt 220 lb (99.8 kg)   BMI 37.76 kg/m         Assessment & Plan:  Toni Cooper comes in today with chief complaint of Medical Management of Chronic Issues   Diagnosis and orders addressed:  1. Essential hypertension Low sodium diet -  hydrochlorothiazide (HYDRODIURIL) 25 MG tablet; Take 1 tablet (25 mg total) by mouth daily.  Dispense: 90 tablet; Refill: 3 - lisinopril (ZESTRIL) 20 MG tablet; Take 1 tablet (20 mg total) by mouth daily.  Dispense: 90 tablet; Refill: 3  2. Hyperlipidemia, unspecified hyperlipidemia type Low fat diet - atorvastatin (LIPITOR) 10 MG tablet; TAKE 1 TABLET BY MOUTH  DAILY AT 6PM.  Dispense: 90 tablet; Refill: 3  3. Type 2 diabetes mellitus without complication, without long-term current use of insulin (HCC) Carb counting encouraged - metFORMIN (GLUCOPHAGE-XR) 500 MG 24 hr tablet; Take 1 tablet (500 mg total) by mouth 2 (two) times daily.  Dispense: 180  tablet; Refill: 1  4. S/P minimally invasive aortic valve replacement with bioprosthetic valve Keep follow up with cardiology yearly  5. Murmur  6. Age-related osteoporosis without current pathological fracture Weight bearing exercise - alendronate (FOSAMAX) 70 MG tablet; TAKE 1 TABLET BY MOUTH ONCE A WEEK. TAKE WITH A FULL  GLASS OF WATER ON AN EMPTY  STOMACH.  Dispense: 12 tablet; Refill: 1  7. Class 2 severe obesity due to excess calories with serious comorbidity and body mass index (BMI) of 38.0 to 38.9 in adult [facility]) Discussed diet and exercise for person with BMI >25 Will recheck weight in 3-6 months    Labs pending Health Maintenance reviewed Diet and exercise encouraged  Follow up plan: 6 months   Mary-Margaret Hassell Done, FNP

## 2019-05-08 LAB — HM MAMMOGRAPHY

## 2019-08-06 ENCOUNTER — Ambulatory Visit: Payer: Medicare Other

## 2019-08-14 ENCOUNTER — Other Ambulatory Visit: Payer: Self-pay

## 2019-08-17 ENCOUNTER — Ambulatory Visit: Payer: Medicare Other

## 2019-08-17 ENCOUNTER — Other Ambulatory Visit: Payer: Self-pay

## 2019-08-17 ENCOUNTER — Ambulatory Visit (INDEPENDENT_AMBULATORY_CARE_PROVIDER_SITE_OTHER): Payer: Medicare Other

## 2019-08-17 DIAGNOSIS — Z23 Encounter for immunization: Secondary | ICD-10-CM | POA: Diagnosis not present

## 2019-08-21 IMAGING — CT CT ANGIO CHEST
1 of 2 series · 1 of 16 positions shown · IV contrast (isovue)
Comparison: No priors.

CLINICAL DATA: 71-year-old female with history of severe aortic
stenosis. Preprocedural study prior to potential transcatheter
aortic valve replacement (TAVR) procedure.

EXAM:
CT ANGIOGRAPHY CHEST, ABDOMEN AND PELVIS
TECHNIQUE: Multidetector CT imaging through the chest, abdomen and pelvis was
performed using the standard protocol during bolus administration of
intravenous contrast. Multiplanar reconstructed images and MIPs were
obtained and reviewed to evaluate the vascular anatomy.
CONTRAST:  80 mL of Isovue 370.

[Series 405: — · 0.17mm/px · 1 of 7 slices shown]
[im 4/7]
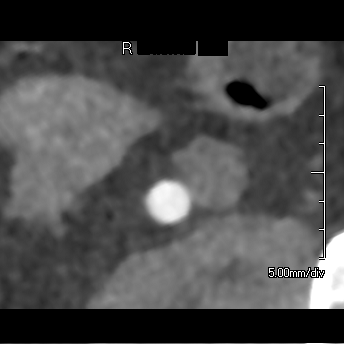

[1 of 16 positions shown; findings below may reference images not displayed]

FINDINGS: CTA CHEST FINDINGS

Cardiovascular: Heart size is borderline enlarged. There is no
significant pericardial fluid, thickening or pericardial
calcification. Aortic atherosclerosis (mild), without evidence of
aneurysm or dissection in the thoracic vasculature. No definite
coronary artery calcifications. Severe thickening and calcification
of the aortic valve, compatible with the reported clinical history
of aortic stenosis.

Mediastinum/Lymph Nodes: No pathologically enlarged mediastinal or
hilar lymph nodes. Moderate-sized hiatal hernia. No axillary
lymphadenopathy.

Lungs/Pleura: Mild linear scarring in the right upper lobe
anteriorly and in the inferior segment of the lingula. No acute
consolidative airspace disease. No pleural effusions. No suspicious
appearing pulmonary nodules or masses.

Musculoskeletal/Soft Tissues: Chronic appearing compression fracture
of T8 with approximately 25% loss of anterior vertebral body height.
There are no aggressive appearing lytic or blastic lesions noted in
the visualized portions of the skeleton. Right shoulder arthroplasty
incompletely imaged.

CTA ABDOMEN AND PELVIS FINDINGS

Hepatobiliary: No cystic or solid hepatic lesions. No intra or
extrahepatic biliary ductal dilatation. Gallbladder is normal in
appearance.

Pancreas: Severe fatty atrophy of the pancreas which is nearly
completely replaced with fat. No pancreatic mass. No pancreatic
ductal dilatation. No pancreatic or peripancreatic fluid or
inflammatory changes.

Spleen: Unremarkable.

Adrenals/Urinary Tract: Bilateral kidneys and bilateral adrenal
glands are normal in appearance. There is no hydroureteronephrosis.
Urinary bladder is normal in appearance.

Stomach/Bowel: Normal appearance of the intraabdominal portion of
the stomach. There is no pathologic dilatation of small bowel or
colon. A few scattered colonic diverticulae are noted, without
surrounding inflammatory changes to suggest acute diverticulitis at
this time. The appendix is not confidently identified and may be
surgically absent. Regardless, there are no inflammatory changes
noted adjacent to the cecum to suggest the presence of an acute
appendicitis at this time.

Vascular/Lymphatic: Aortic atherosclerosis with vascular findings
and measurements pertinent to potential TAVR procedure, as detailed
below. Celiac axis, superior mesenteric artery and inferior
mesenteric artery are all widely patent without hemodynamically
significant stenosis. Single renal arteries bilaterally are both
widely patent without hemodynamically significant stenosis. No
lymphadenopathy noted in the abdomen or pelvis.

Reproductive: Status posthysterectomy. Ovaries are not confidently
identified may be surgically absent or atrophic.

Other: No significant volume of ascites. No pneumoperitoneum. Tiny
umbilical hernia containing only omental fat.

Musculoskeletal: There are no aggressive appearing lytic or blastic
lesions noted in the visualized portions of the skeleton.

VASCULAR MEASUREMENTS PERTINENT TO TAVR:

AORTA:

Minimal Aortic Diameter -  15 x 15 mm

Severity of Aortic Calcification -  mild

RIGHT PELVIS:

Right Common Iliac Artery -

Minimal Diameter - 10.5 x 9.6 mm

Tortuosity - mild

Calcification - none

Right External Iliac Artery -

Minimal Diameter - 7.1 x 7.6 mm

Tortuosity - mild

Calcification - none

Right Common Femoral Artery -

Minimal Diameter - 7.6 x 6.8 mm

Tortuosity - mild

Calcification - none

LEFT PELVIS:

Left Common Iliac Artery -

Minimal Diameter - 10.4 x 10.0 mm

Tortuosity - mild

Calcification - none

Left External Iliac Artery -

Minimal Diameter - 7.6 x 7.9 mm

Tortuosity - mild

Calcification - none

Left Common Femoral Artery -

Minimal Diameter - 6.9 x 7.6 mm

Tortuosity - mild

Calcification - none

Review of the MIP images confirms the above findings.
IMPRESSION: 1. Vascular findings and measurements pertinent to potential TAVR
procedure, as detailed above. This patient does have suitable pelvic
arterial access bilaterally.
2. Severe thickening calcification of the aortic valve, compatible
with the reported clinical history of severe aortic stenosis.
3. Aortic atherosclerosis.
4. Severe fatty atrophy of the pancreas.
5. Moderate sized hiatal hernia.
6. Additional incidental findings, as above.
Aortic Atherosclerosis ([id number]).

## 2019-08-27 ENCOUNTER — Other Ambulatory Visit: Payer: Self-pay | Admitting: Nurse Practitioner

## 2019-08-27 DIAGNOSIS — E119 Type 2 diabetes mellitus without complications: Secondary | ICD-10-CM

## 2019-10-06 ENCOUNTER — Other Ambulatory Visit: Payer: Self-pay | Admitting: Nurse Practitioner

## 2019-10-06 DIAGNOSIS — M810 Age-related osteoporosis without current pathological fracture: Secondary | ICD-10-CM

## 2019-10-08 ENCOUNTER — Other Ambulatory Visit: Payer: Self-pay

## 2019-10-08 ENCOUNTER — Ambulatory Visit: Payer: Self-pay | Admitting: Nurse Practitioner

## 2019-10-09 ENCOUNTER — Encounter: Payer: Self-pay | Admitting: Nurse Practitioner

## 2019-10-09 ENCOUNTER — Ambulatory Visit (INDEPENDENT_AMBULATORY_CARE_PROVIDER_SITE_OTHER): Payer: Medicare Other | Admitting: Nurse Practitioner

## 2019-10-09 VITALS — BP 142/64 | HR 64 | Temp 99.1°F | Resp 20 | Ht 64.0 in | Wt 219.0 lb

## 2019-10-09 DIAGNOSIS — Z6838 Body mass index (BMI) 38.0-38.9, adult: Secondary | ICD-10-CM

## 2019-10-09 DIAGNOSIS — E119 Type 2 diabetes mellitus without complications: Secondary | ICD-10-CM

## 2019-10-09 DIAGNOSIS — E66812 Obesity, class 2: Secondary | ICD-10-CM

## 2019-10-09 DIAGNOSIS — I10 Essential (primary) hypertension: Secondary | ICD-10-CM | POA: Diagnosis not present

## 2019-10-09 DIAGNOSIS — Z953 Presence of xenogenic heart valve: Secondary | ICD-10-CM | POA: Diagnosis not present

## 2019-10-09 DIAGNOSIS — M810 Age-related osteoporosis without current pathological fracture: Secondary | ICD-10-CM

## 2019-10-09 DIAGNOSIS — E785 Hyperlipidemia, unspecified: Secondary | ICD-10-CM | POA: Diagnosis not present

## 2019-10-09 NOTE — Patient Instructions (Signed)
Diabetes Mellitus and Foot Care Foot care is an important part of your health, especially when you have diabetes. Diabetes may cause you to have problems because of poor blood flow (circulation) to your feet and legs, which can cause your skin to:  Become thinner and drier.  Break more easily.  Heal more slowly.  Peel and crack. You may also have nerve damage (neuropathy) in your legs and feet, causing decreased feeling in them. This means that you may not notice minor injuries to your feet that could lead to more serious problems. Noticing and addressing any potential problems early is the best way to prevent future foot problems. How to care for your feet Foot hygiene  Wash your feet daily with warm water and mild soap. Do not use hot water. Then, pat your feet and the areas between your toes until they are completely dry. Do not soak your feet as this can dry your skin.  Trim your toenails straight across. Do not dig under them or around the cuticle. File the edges of your nails with an emery board or nail file.  Apply a moisturizing lotion or petroleum jelly to the skin on your feet and to dry, brittle toenails. Use lotion that does not contain alcohol and is unscented. Do not apply lotion between your toes. Shoes and socks  Wear clean socks or stockings every day. Make sure they are not too tight. Do not wear knee-high stockings since they may decrease blood flow to your legs.  Wear shoes that fit properly and have enough cushioning. Always look in your shoes before you put them on to be sure there are no objects inside.  To break in new shoes, wear them for just a few hours a day. This prevents injuries on your feet. Wounds, scrapes, corns, and calluses  Check your feet daily for blisters, cuts, bruises, sores, and redness. If you cannot see the bottom of your feet, use a mirror or ask someone for help.  Do not cut corns or calluses or try to remove them with medicine.  If you  find a minor scrape, cut, or break in the skin on your feet, keep it and the skin around it clean and dry. You may clean these areas with mild soap and water. Do not clean the area with peroxide, alcohol, or iodine.  If you have a wound, scrape, corn, or callus on your foot, look at it several times a day to make sure it is healing and not infected. Check for: ? Redness, swelling, or pain. ? Fluid or blood. ? Warmth. ? Pus or a bad smell. General instructions  Do not cross your legs. This may decrease blood flow to your feet.  Do not use heating pads or hot water bottles on your feet. They may burn your skin. If you have lost feeling in your feet or legs, you may not know this is happening until it is too late.  Protect your feet from hot and cold by wearing shoes, such as at the beach or on hot pavement.  Schedule a complete foot exam at least once a year (annually) or more often if you have foot problems. If you have foot problems, report any cuts, sores, or bruises to your health care provider immediately. Contact a health care provider if:  You have a medical condition that increases your risk of infection and you have any cuts, sores, or bruises on your feet.  You have an injury that is not   healing.  You have redness on your legs or feet.  You feel burning or tingling in your legs or feet.  You have pain or cramps in your legs and feet.  Your legs or feet are numb.  Your feet always feel cold.  You have pain around a toenail. Get help right away if:  You have a wound, scrape, corn, or callus on your foot and: ? You have pain, swelling, or redness that gets worse. ? You have fluid or blood coming from the wound, scrape, corn, or callus. ? Your wound, scrape, corn, or callus feels warm to the touch. ? You have pus or a bad smell coming from the wound, scrape, corn, or callus. ? You have a fever. ? You have a red line going up your leg. Summary  Check your feet every day  for cuts, sores, red spots, swelling, and blisters.  Moisturize feet and legs daily.  Wear shoes that fit properly and have enough cushioning.  If you have foot problems, report any cuts, sores, or bruises to your health care provider immediately.  Schedule a complete foot exam at least once a year (annually) or more often if you have foot problems. This information is not intended to replace advice given to you by your health care provider. Make sure you discuss any questions you have with your health care provider. Document Released: 11/02/2000 Document Revised: 12/18/2017 Document Reviewed: 12/07/2016 Elsevier Patient Education  2020 [employer]  

## 2019-10-09 NOTE — Progress Notes (Signed)
Subjective:    Patient ID: Toni Cooper, female    DOB: 06-10-1946, 73 y.o.   MRN: [id number]   Chief Complaint: Medical Management of Chronic Issues    HPI:  1. Essential hypertension No c/o chest pain, sob or headache. Does not check blood pressure at home. BP Readings from Last 3 Encounters:  04/09/19 131/60  10/31/18 (!) 141/67  10/13/18 (!) 150/82     2. Hyperlipidemia, unspecified hyperlipidemia type Does not watch diet and does no exercise. Lab Results  Component Value Date   CHOL 98 (L) 04/04/2017   HDL 38 (L) 04/04/2017   LDLCALC 49 04/04/2017   TRIG 54 04/04/2017   CHOLHDL 2.6 04/04/2017     3. Type 2 diabetes mellitus without complication, without long-term current use of insulin (HCC) She does not check her blood sugars very often. Denies any symptoms of low blood sugars. She sees Dr. Chalmers Cater and sh ebrought her labs with her. Her HGBAc1 last month was 7.0. Lab Results  Component Value Date   HGBA1C 6.9 (H) 10/14/2017     4. S/P minimally invasive aortic valve replacement with bioprosthetic valve She has not seen cardiology since December of last year.according to the last office note she was to follow up in 1 year.  5. Age-related osteoporosis without current pathological fracture Last dexascan was done 11/28/16 with tscore of -3.4. she is currently taking fosamax.  6. Class 2 severe obesity due to excess calories with serious comorbidity and body mass index (BMI) of 38.0 to 38.9 in adult [facility]) No recent weight changes Wt Readings from Last 3 Encounters:  10/09/19 219 lb (99.3 kg)  04/09/19 220 lb (99.8 kg)  10/31/18 218 lb 3.2 oz (99 kg)   BMI Readings from Last 3 Encounters:  10/09/19 37.59 kg/m  04/09/19 37.76 kg/m  10/31/18 37.45 kg/m       Outpatient Encounter Medications as of 10/09/2019  Medication Sig  . acetaminophen (TYLENOL) 500 MG tablet Take 500 mg 2 (two) times daily as needed by mouth for moderate pain or headache.   .  alendronate (FOSAMAX) 70 MG tablet TAKE 1 TABLET BY MOUTH ONCE A WEEK. TAKE WITH A FULL  GLASS OF WATER ON AN EMPTY  STOMACH.  [name] Ascorbic Acid (VITAMIN C) 1000 MG tablet Take 1,000 mg daily by mouth.  [name] aspirin 81 MG EC tablet Take 1 tablet (81 mg total) by mouth daily.  [name] atorvastatin (LIPITOR) 10 MG tablet TAKE 1 TABLET BY MOUTH  DAILY AT 6PM.  . beta carotene w/minerals (OCUVITE) tablet Take 1 tablet by mouth daily.  . calcium carbonate (TUMS - DOSED IN MG ELEMENTAL CALCIUM) 500 MG chewable tablet Chew 1 tablet by mouth daily as needed for indigestion or heartburn.  . calcium-vitamin D (OSCAL 500/200 D-3) 500-200 MG-UNIT tablet Take 1 tablet by mouth 2 (two) times daily.  . cholecalciferol (VITAMIN D) 1000 UNITS tablet Take 1,000 Units by mouth daily.  . fluticasone (FLONASE) 50 MCG/ACT nasal spray Place 1 spray into both nostrils daily as needed for allergies or rhinitis.  [name] HUMALOG 100 UNIT/ML injection Inject 6-20 Units into the skin 3 (three) times daily with meals. Using sliding scale.  . hydrochlorothiazide (HYDRODIURIL) 25 MG tablet Take 1 tablet (25 mg total) by mouth daily.  [name] LANTUS 100 UNIT/ML injection Inject 35 Units into the skin at bedtime.   [name] lisinopril (ZESTRIL) 20 MG tablet Take 1 tablet (20 mg total) by mouth daily.  [name] LORazepam (ATIVAN) 0.5 MG  tablet Take 1 tablet (0.5 mg total) by mouth 2 (two) times daily as needed for anxiety.  . Magnesium 250 MG TABS Take 250 mg by mouth daily.   . metFORMIN (GLUCOPHAGE-XR) 500 MG 24 hr tablet TAKE 1 TABLET BY MOUTH  TWICE DAILY  . metoprolol tartrate (LOPRESSOR) 25 MG tablet Take 0.5 tablets (12.5 mg total) by mouth 2 (two) times daily.  . Multiple Vitamins-Minerals (MULTI-BETIC DIABETES) TABS Take 1 tablet daily by mouth.  . pyridOXINE (VITAMIN B-6) 100 MG tablet Take 100 mg by mouth daily.  [name] TEST test strip Test four times daily.     Past Surgical History:  Procedure Laterality Date  . ABDOMINAL HYSTERECTOMY    .  APPENDECTOMY    . FRACTURE SURGERY     shoulder  . TEE WITHOUT CARDIOVERSION N/A 04/18/2016   Procedure: TRANSESOPHAGEAL ECHOCARDIOGRAM (TEE);  Surgeon: Thayer Headings, MD;  Location: [place];  Service: Cardiovascular;  Laterality: N/A;  . TEE WITHOUT CARDIOVERSION N/A 10/16/2017   Procedure: TRANSESOPHAGEAL ECHOCARDIOGRAM (TEE);  Surgeon: Rexene Alberts, MD;  Location: [place];  Service: Open Heart Surgery;  Laterality: N/A;  . WRIST SURGERY      Family History  Problem Relation Age of Onset  . Stroke Father 66  . Diabetes Father   . Retinopathy of prematurity Father   . Heart failure Mother   . Heart attack Maternal Grandmother   . Heart attack Paternal Grandmother   . Diabetes Paternal Aunt   . Diabetes Paternal Uncle     New complaints: None today  Social history: Lives with her husband  Controlled substance contract: n/a    Review of Systems  Constitutional: Negative for activity change and appetite change.  HENT: Negative.   Eyes: Negative for pain.  Respiratory: Negative for shortness of breath.   Cardiovascular: Negative for chest pain, palpitations and leg swelling.  Gastrointestinal: Negative for abdominal pain.  Endocrine: Negative for polydipsia.  Genitourinary: Negative.   Skin: Negative for rash.  Neurological: Negative for dizziness, weakness and headaches.  Hematological: Does not bruise/bleed easily.  Psychiatric/Behavioral: Negative.   All other systems reviewed and are negative.      Objective:   Physical Exam Vitals signs and nursing note reviewed.  Constitutional:      General: She is not in acute distress.    Appearance: Normal appearance. She is well-developed.  HENT:     Head: Normocephalic.     Nose: Nose normal.  Eyes:     Pupils: Pupils are equal, round, and reactive to light.  Neck:     Musculoskeletal: Normal range of motion and neck supple.     Vascular: No carotid bruit or JVD.  Cardiovascular:     Rate and Rhythm:  Normal rate and regular rhythm.     Heart sounds: Normal heart sounds.  Pulmonary:     Effort: Pulmonary effort is normal. No respiratory distress.     Breath sounds: Normal breath sounds. No wheezing or rales.  Chest:     Chest wall: No tenderness.  Abdominal:     General: Bowel sounds are normal. There is no distension or abdominal bruit.     Palpations: Abdomen is soft. There is no hepatomegaly, splenomegaly, mass or pulsatile mass.     Tenderness: There is no abdominal tenderness.  Musculoskeletal: Normal range of motion.  Lymphadenopathy:     Cervical: No cervical adenopathy.  Skin:    General: Skin is warm and dry.  Neurological:  Mental Status: She is alert and oriented to person, place, and time.     Deep Tendon Reflexes: Reflexes are normal and symmetric.  Psychiatric:        Behavior: Behavior normal.        Thought Content: Thought content normal.        Judgment: Judgment normal.     BP (!) 142/64   Pulse 64   Temp 99.1 F (37.3 C) (Temporal)   Resp 20   Ht 5\' 4"  (1.626 m)   Wt 219 lb (99.3 kg)   SpO2 98%   BMI 37.59 kg/m        Assessment & Plan:  Toni Cooper comes in today with chief complaint of Medical Management of Chronic Issues   Diagnosis and orders addressed:  1. Essential hypertension Low sodium diet  2. Hyperlipidemia, unspecified hyperlipidemia type  low fat diet  3. Type 2 diabetes mellitus without complication, without long-term current use of insulin (HCC) Continue to watch carbs in diet Keep follow up with Dr. Chalmers Cater  4. S/P minimally invasive aortic valve replacement with bioprosthetic valve Keep follow up with Dr. Warren Lacy  5. Age-related osteoporosis without current pathological fracture Weight bearing exercises  6. Class 2 severe obesity due to excess calories with serious comorbidity and body mass index (BMI) of 38.0 to 38.9 in adult [facility]) Discussed diet and exercise for person with BMI >25 Will recheck weight in 3-6  months    Labs results reviewed at [facility] Maintenance reviewed Diet and exercise encouraged  Follow up plan: 6 months   [place], FNP

## 2019-11-16 ENCOUNTER — Other Ambulatory Visit: Payer: Self-pay | Admitting: Nurse Practitioner

## 2019-11-16 DIAGNOSIS — E119 Type 2 diabetes mellitus without complications: Secondary | ICD-10-CM

## 2019-11-21 NOTE — Progress Notes (Signed)
Cardiology Office Note   Date:  11/25/2019   ID:  AVANELLE RITCH, DOB 1945/11/22, MRN [id number]  PCP:  [name], FNP  Cardiologist:   No primary care provider on file.   Chief Complaint  Patient presents with  . Aortic Stenosis      History of Present Illness: Toni Cooper is a 74 y.o. female who presents for follow up after SAVR.   Since I last saw her.    She is done well. The patient denies any new symptoms such as chest discomfort, neck or arm discomfort. There has been no new shortness of breath, PND or orthopnea. There have been no reported palpitations, presyncope or syncope.   Past Medical History:  Diagnosis Date  . Aortic stenosis, severe 2015  . Cataract   . Diabetes mellitus without complication ([place])   . Dyspnea   . Hypertension   . Obesity   . Osteoporosis   . S/P minimally invasive aortic valve replacement with bioprosthetic valve 10/16/2017   23 mm Edwards Intuity Elite rapid deployment bovine stented bioprosthetic tissue valve via partial upper sternotomy    Past Surgical History:  Procedure Laterality Date  . ABDOMINAL HYSTERECTOMY    . APPENDECTOMY    . FRACTURE SURGERY     shoulder  . TEE WITHOUT CARDIOVERSION N/A 04/18/2016   Procedure: TRANSESOPHAGEAL ECHOCARDIOGRAM (TEE);  Surgeon: Thayer Headings, MD;  Location: [place];  Service: Cardiovascular;  Laterality: N/A;  . TEE WITHOUT CARDIOVERSION N/A 10/16/2017   Procedure: TRANSESOPHAGEAL ECHOCARDIOGRAM (TEE);  Surgeon: Rexene Alberts, MD;  Location: [place];  Service: Open Heart Surgery;  Laterality: N/A;  . WRIST SURGERY       Current Outpatient Medications  Medication Sig Dispense Refill  . acetaminophen (TYLENOL) 500 MG tablet Take 500 mg 2 (two) times daily as needed by mouth for moderate pain or headache.     . alendronate (FOSAMAX) 70 MG tablet TAKE 1 TABLET BY MOUTH ONCE A WEEK. TAKE WITH A FULL  GLASS OF WATER ON AN EMPTY  STOMACH. 12 tablet 0  . Ascorbic Acid (VITAMIN C)  1000 MG tablet Take 1,000 mg daily by mouth.    [name] aspirin 81 MG EC tablet Take 1 tablet (81 mg total) by mouth daily.    [name] atorvastatin (LIPITOR) 10 MG tablet TAKE 1 TABLET BY MOUTH  DAILY AT 6PM. 90 tablet 0  . beta carotene w/minerals (OCUVITE) tablet Take 1 tablet by mouth daily.    . calcium carbonate (TUMS - DOSED IN MG ELEMENTAL CALCIUM) 500 MG chewable tablet Chew 1 tablet by mouth daily as needed for indigestion or heartburn.    . calcium-vitamin D (OSCAL 500/200 D-3) 500-200 MG-UNIT tablet Take 1 tablet by mouth 2 (two) times daily.    . cholecalciferol (VITAMIN D) 1000 UNITS tablet Take 1,000 Units by mouth daily.    . fluticasone (FLONASE) 50 MCG/ACT nasal spray Place 1 spray into both nostrils daily as needed for allergies or rhinitis.    [name] HUMALOG 100 UNIT/ML injection Inject 6-20 Units into the skin 3 (three) times daily with meals. Using sliding scale.    . hydrochlorothiazide (HYDRODIURIL) 25 MG tablet Take 1 tablet (25 mg total) by mouth daily. 90 tablet 0  . LANTUS 100 UNIT/ML injection Inject 35 Units into the skin at bedtime.     [name] lisinopril (ZESTRIL) 20 MG tablet Take 1 tablet (20 mg total) by mouth daily. 90 tablet 0  . LORazepam (ATIVAN) 0.5  MG tablet Take 1 tablet (0.5 mg total) by mouth 2 (two) times daily as needed for anxiety. 60 tablet 1  . Magnesium 250 MG TABS Take 250 mg by mouth daily.     . metFORMIN (GLUCOPHAGE-XR) 500 MG 24 hr tablet TAKE 1 TABLET BY MOUTH  TWICE DAILY 180 tablet 0  . metoprolol tartrate (LOPRESSOR) 25 MG tablet Take 0.5 tablets (12.5 mg total) by mouth 2 (two) times daily. 90 tablet 0  . Multiple Vitamins-Minerals (MULTI-BETIC DIABETES) TABS Take 1 tablet daily by mouth.    . pyridOXINE (VITAMIN B-6) 100 MG tablet Take 100 mg by mouth daily.    Toni Cooper TEST test strip Test four times daily.     No current facility-administered medications for this visit.    Allergies:   Sulfa antibiotics    ROS:  Please see the history of present  illness.   Otherwise, review of systems are positive for none.   All other systems are reviewed and negative.    PHYSICAL EXAM: VS:  BP (!) 142/80   Pulse 66   Ht 5\' 5"  (1.651 m)   Wt 219 lb (99.3 kg)   BMI 36.44 kg/m  , BMI Body mass index is 36.44 kg/m. GENERAL:  Well appearing NECK:  No jugular venous distention, waveform within normal limits, carotid upstroke brisk and symmetric, no bruits, no thyromegaly LUNGS:  Clear to auscultation bilaterally CHEST:  Well healed sternotomy scar. HEART:  PMI not displaced or sustained,S1 and S2 within normal limits, no S3, no S4, no clicks, no rubs, soft apical systolic bruit murmur, no diastolic murmurs ABD:  Flat, positive bowel sounds normal in frequency in pitch, no bruits, no rebound, no guarding, no midline pulsatile mass, no hepatomegaly, no splenomegaly EXT:  2 plus pulses throughout, no edema, no cyanosis no clubbing   EKG:  EKG is ordered today. The ekg ordered today demonstrates sinus rhythm, 66, borderline interventricular conduction delay   Recent Labs: No results found for requested labs within last 8760 hours.    Lipid Panel    Component Value Date/Time   CHOL 98 (L) 04/04/2017 0830   TRIG 54 04/04/2017 0830   TRIG 74 02/25/2014 0000   HDL 38 (L) 04/04/2017 0830   CHOLHDL 2.6 04/04/2017 0830   LDLCALC 49 04/04/2017 0830      Wt Readings from Last 3 Encounters:  11/25/19 219 lb (99.3 kg)  10/09/19 219 lb (99.3 kg)  04/09/19 220 lb (99.8 kg)      Other studies Reviewed: Additional studies/ records that were reviewed today include: Labs. Review of the above records demonstrates:  Please see elsewhere in the note.     ASSESSMENT AND PLAN:  SAVR:  Is doing well.  No further imaging is indicated.  OVERWEIGHT:  We again discussed weight loss her weight is up a little bit.  DM: A1C was 7 which was up from 6.7.    This is followed by [name], FNP   HTN:   Slightly elevated but this is  unusual.  No change in therapy.  COVID EDUCATION: He is anxious to get the vaccine.  We talked about this.  Current medicines are reviewed at length with the patient today.  The patient does not have concerns regarding medicines.  The following changes have been made:  no change  Labs/ tests ordered today include: None  Orders Placed This Encounter  Procedures  . EKG 12-Lead     Disposition:   FU with me in  one year.     Signed, [name], MD  11/25/2019 10:16 AM    [place]

## 2019-11-23 ENCOUNTER — Telehealth: Payer: Self-pay | Admitting: Nurse Practitioner

## 2019-11-23 DIAGNOSIS — E119 Type 2 diabetes mellitus without complications: Secondary | ICD-10-CM

## 2019-11-23 DIAGNOSIS — I10 Essential (primary) hypertension: Secondary | ICD-10-CM

## 2019-11-23 DIAGNOSIS — E785 Hyperlipidemia, unspecified: Secondary | ICD-10-CM

## 2019-11-23 MED ORDER — LISINOPRIL 20 MG PO TABS: 20.0000 mg | ORAL_TABLET | Freq: Every day | ORAL | 0 refills | Status: DC

## 2019-11-23 MED ORDER — HYDROCHLOROTHIAZIDE 25 MG PO TABS: 25.0000 mg | ORAL_TABLET | Freq: Every day | ORAL | 0 refills | Status: DC

## 2019-11-23 MED ORDER — ATORVASTATIN CALCIUM 10 MG PO TABS: ORAL_TABLET | ORAL | 0 refills | Status: DC

## 2019-11-23 MED ORDER — METOPROLOL TARTRATE 25 MG PO TABS: 12.5000 mg | ORAL_TABLET | Freq: Two times a day (BID) | ORAL | 0 refills | Status: DC

## 2019-11-23 NOTE — Telephone Encounter (Signed)
Pt aware of medications that were sent to [place]

## 2019-11-25 ENCOUNTER — Other Ambulatory Visit: Payer: Self-pay

## 2019-11-25 ENCOUNTER — Ambulatory Visit: Payer: Medicare Other | Admitting: Cardiology

## 2019-11-25 ENCOUNTER — Encounter: Payer: Self-pay | Admitting: Cardiology

## 2019-11-25 VITALS — BP 142/80 | HR 66 | Ht 65.0 in | Wt 219.0 lb

## 2019-11-25 DIAGNOSIS — Z7189 Other specified counseling: Secondary | ICD-10-CM | POA: Diagnosis not present

## 2019-11-25 DIAGNOSIS — Z952 Presence of prosthetic heart valve: Secondary | ICD-10-CM | POA: Diagnosis not present

## 2019-11-25 DIAGNOSIS — I10 Essential (primary) hypertension: Secondary | ICD-10-CM | POA: Diagnosis not present

## 2019-11-25 NOTE — Patient Instructions (Signed)
Medication Instructions:  The current medical regimen is effective;  continue present plan and medications.  *If you need a refill on your cardiac medications before your next appointment, please call your pharmacy*  Follow-Up: At [facility], you and your health needs are our priority.  As part of our continuing mission to provide you with exceptional heart care, we have created designated Provider Care Teams.  These Care Teams include your primary Cardiologist (physician) and Advanced Practice Providers (APPs -  Physician Assistants and Nurse Practitioners) who all work together to provide you with the care you need, when you need it.  Your next appointment:   12 month(s)  The format for your next appointment:   In Person  Provider:   James Hochrein, MD  Thank you for choosing [facility]!!     

## 2019-11-26 ENCOUNTER — Other Ambulatory Visit: Payer: Self-pay | Admitting: Cardiology

## 2019-12-02 DIAGNOSIS — H40001 Preglaucoma, unspecified, right eye: Secondary | ICD-10-CM | POA: Insufficient documentation

## 2019-12-02 DIAGNOSIS — H25812 Combined forms of age-related cataract, left eye: Secondary | ICD-10-CM | POA: Insufficient documentation

## 2020-02-10 ENCOUNTER — Other Ambulatory Visit: Payer: Self-pay | Admitting: Nurse Practitioner

## 2020-02-10 DIAGNOSIS — E785 Hyperlipidemia, unspecified: Secondary | ICD-10-CM

## 2020-03-07 ENCOUNTER — Encounter: Payer: Self-pay | Admitting: Nurse Practitioner

## 2020-03-07 ENCOUNTER — Ambulatory Visit: Payer: Medicare PPO | Admitting: Nurse Practitioner

## 2020-03-07 ENCOUNTER — Other Ambulatory Visit: Payer: Self-pay

## 2020-03-07 VITALS — BP 162/69 | HR 73 | Temp 98.2°F | Resp 20 | Ht 65.0 in | Wt 222.0 lb

## 2020-03-07 DIAGNOSIS — L989 Disorder of the skin and subcutaneous tissue, unspecified: Secondary | ICD-10-CM | POA: Diagnosis not present

## 2020-03-07 MED ORDER — CEPHALEXIN 500 MG PO CAPS: 500.0000 mg | ORAL_CAPSULE | Freq: Three times a day (TID) | ORAL | 0 refills | Status: DC

## 2020-03-07 NOTE — Progress Notes (Signed)
   Subjective:    Patient ID: Toni Cooper, female    DOB: 1946/03/27, 74 y.o.   MRN: [id number]   Chief Complaint: ? bite on inside of left thigh (Started Amoxicillin yesterday )   HPI Patient comes in with a sore place on her left medial knee area. The lesions has been here for over a month. Has gotten bigger and pant leg is rubbing in it. She started taking amoxicillin yesterday but only had 4 tablets.  Review of Systems  Constitutional: Negative for diaphoresis.  Eyes: Negative for pain.  Respiratory: Negative for shortness of breath.   Cardiovascular: Negative for chest pain, palpitations and leg swelling.  Gastrointestinal: Negative for abdominal pain.  Endocrine: Negative for polydipsia.  Skin: Negative for rash.  Neurological: Negative for dizziness, weakness and headaches.  Hematological: Does not bruise/bleed easily.  All other systems reviewed and are negative.      Objective:   Physical Exam Vitals and nursing note reviewed.  Constitutional:      Appearance: Normal appearance.  Cardiovascular:     Rate and Rhythm: Normal rate and regular rhythm.     Heart sounds: Normal heart sounds.  Skin:    Findings: Lesion (3cm raised flesh colored lesion with surrounding erythema left medial knee.) present.  Neurological:     General: No focal deficit present.     Mental Status: She is alert and oriented to person, place, and time.  Psychiatric:        Mood and Affect: Mood normal.        Behavior: Behavior normal.       BP (!) 162/69   Pulse 73   Temp 98.2 F (36.8 C) (Temporal)   Resp 20   Ht 5\' 5"  (1.651 m)   Wt 222 lb (100.7 kg)   BMI 36.94 kg/m         Assessment & Plan:  Toni Cooper in today with chief complaint of ? bite on inside of left thigh (Started Amoxicillin yesterday )   1. Skin lesion of lower extremity Do not pick or scratch Take keflex Keep covered until can see dermatology  - cephALEXin (KEFLEX) 500 MG capsule; Take 1 capsule (500 mg  total) by mouth 3 (three) times daily.  Dispense: 21 capsule; Refill: 0 - Ambulatory referral to Dermatology    The above assessment and management plan was discussed with the patient. The patient verbalized understanding of and has agreed to the management plan. Patient is aware to call the clinic if symptoms persist or worsen. Patient is aware when to return to the clinic for a follow-up visit. Patient educated on when it is appropriate to go to the emergency department.   Mary-Margaret Hassell Done, FNP

## 2020-03-07 NOTE — Addendum Note (Signed)
Addended by: [name] on: 03/07/2020 03:09 PM   Modules accepted: Orders

## 2020-03-30 ENCOUNTER — Ambulatory Visit: Payer: Medicare PPO | Admitting: Dermatology

## 2020-03-30 ENCOUNTER — Other Ambulatory Visit: Payer: Self-pay

## 2020-03-30 ENCOUNTER — Encounter: Payer: Self-pay | Admitting: Dermatology

## 2020-03-30 ENCOUNTER — Other Ambulatory Visit: Payer: Self-pay | Admitting: Dermatology

## 2020-03-30 DIAGNOSIS — C4492 Squamous cell carcinoma of skin, unspecified: Secondary | ICD-10-CM

## 2020-03-30 DIAGNOSIS — D485 Neoplasm of uncertain behavior of skin: Secondary | ICD-10-CM | POA: Diagnosis not present

## 2020-03-30 HISTORY — DX: Squamous cell carcinoma of skin, unspecified: C44.92

## 2020-03-30 MED ORDER — MUPIROCIN 2 % EX OINT: 1.0000 "application " | TOPICAL_OINTMENT | Freq: Two times a day (BID) | CUTANEOUS | 0 refills | Status: DC

## 2020-03-30 NOTE — Patient Instructions (Signed)

## 2020-03-30 NOTE — Progress Notes (Signed)
PCP GAVE ANTIBIOTIC KEFLEX 4/19

## 2020-04-01 ENCOUNTER — Telehealth: Payer: Self-pay

## 2020-04-01 NOTE — Telephone Encounter (Signed)
-----   Message from Lavonna Monarch, MD sent at 03/31/2020  9:40 PM EDT ----- Schedule surgery with Dr. Darene Lamer

## 2020-04-01 NOTE — Telephone Encounter (Signed)
Pathology results to patient. 30 minute surgery with ST on June 10th at 9:00am.

## 2020-04-01 NOTE — Telephone Encounter (Signed)
Left voicemail for patient to call for pathology results. 

## 2020-04-02 ENCOUNTER — Encounter: Payer: Self-pay | Admitting: Dermatology

## 2020-04-02 NOTE — Progress Notes (Signed)
   Follow-Up Visit   Subjective  Toni Cooper is a 74 y.o. female who presents for the following: Skin Problem (left leg x months started out with a bite peeled and has a scab).  growth Location: left leg Duration: few months Quality: larger Associated Signs/Symptoms:painful Modifying Factors:  Severity:  Timing: Context:   The following portions of the chart were reviewed this encounter and updated as appropriate: Tobacco  Allergies  Meds  Problems  Med Hx  Surg Hx  Fam Hx      Objective  Well appearing patient in no apparent distress; mood and affect are within normal limits.  A focused examination was performed including head, neck, face. Relevant physical exam findings are noted in the Assessment and Plan.   Assessment & Plan  Neoplasm of uncertain behavior of skin Left Thigh - Anterior  Skin / nail biopsy Type of biopsy: tangential   Informed consent: discussed and consent obtained   Timeout: patient name, date of birth, surgical site, and procedure verified   Procedure prep:  Patient was prepped and draped in usual sterile fashion Prep type:  Chlorhexidine Anesthesia: the lesion was anesthetized in a standard fashion   Anesthetic:  1% lidocaine w/ epinephrine 1-100,000 local infiltration Instrument used: flexible razor blade   Hemostasis achieved with: ferric subsulfate   Outcome: patient tolerated procedure well   Post-procedure details: wound care instructions given   Additional details:  Mupirocin daily to decrease risk wound infection  Specimen 1 - Surgical pathology Differential Diagnosis: r/o ka Check Margins: No

## 2020-04-08 ENCOUNTER — Encounter: Payer: Self-pay | Admitting: Nurse Practitioner

## 2020-04-08 ENCOUNTER — Other Ambulatory Visit: Payer: Self-pay

## 2020-04-08 ENCOUNTER — Ambulatory Visit: Payer: Medicare PPO | Admitting: Nurse Practitioner

## 2020-04-08 VITALS — BP 134/68 | HR 66 | Temp 98.4°F | Resp 20 | Ht 65.0 in | Wt 221.0 lb

## 2020-04-08 DIAGNOSIS — E119 Type 2 diabetes mellitus without complications: Secondary | ICD-10-CM

## 2020-04-08 DIAGNOSIS — Z953 Presence of xenogenic heart valve: Secondary | ICD-10-CM | POA: Diagnosis not present

## 2020-04-08 DIAGNOSIS — Z6838 Body mass index (BMI) 38.0-38.9, adult: Secondary | ICD-10-CM

## 2020-04-08 DIAGNOSIS — E785 Hyperlipidemia, unspecified: Secondary | ICD-10-CM

## 2020-04-08 DIAGNOSIS — I10 Essential (primary) hypertension: Secondary | ICD-10-CM | POA: Diagnosis not present

## 2020-04-08 DIAGNOSIS — M810 Age-related osteoporosis without current pathological fracture: Secondary | ICD-10-CM

## 2020-04-08 DIAGNOSIS — E782 Mixed hyperlipidemia: Secondary | ICD-10-CM

## 2020-04-08 DIAGNOSIS — E66812 Obesity, class 2: Secondary | ICD-10-CM

## 2020-04-08 DIAGNOSIS — R251 Tremor, unspecified: Secondary | ICD-10-CM

## 2020-04-08 MED ORDER — HYDROCHLOROTHIAZIDE 25 MG PO TABS: 25.0000 mg | ORAL_TABLET | Freq: Every day | ORAL | 1 refills | Status: DC

## 2020-04-08 MED ORDER — ALENDRONATE SODIUM 70 MG PO TABS: ORAL_TABLET | ORAL | 1 refills | Status: DC

## 2020-04-08 MED ORDER — LISINOPRIL 20 MG PO TABS: 20.0000 mg | ORAL_TABLET | Freq: Every day | ORAL | 1 refills | Status: DC

## 2020-04-08 MED ORDER — ATORVASTATIN CALCIUM 10 MG PO TABS: 10.0000 mg | ORAL_TABLET | Freq: Every day | ORAL | 1 refills | Status: DC

## 2020-04-08 MED ORDER — LORAZEPAM 0.5 MG PO TABS: 0.5000 mg | ORAL_TABLET | Freq: Two times a day (BID) | ORAL | 2 refills | Status: DC | PRN

## 2020-04-08 MED ORDER — AMOXICILLIN 500 MG PO CAPS
500.0000 mg | ORAL_CAPSULE | Freq: Two times a day (BID) | ORAL | 0 refills | Status: DC
Start: 2020-04-08 — End: 2020-10-11

## 2020-04-08 MED ORDER — METFORMIN HCL ER 500 MG PO TB24: 500.0000 mg | ORAL_TABLET | Freq: Two times a day (BID) | ORAL | 1 refills | Status: DC

## 2020-04-08 NOTE — Progress Notes (Signed)
Subjective:    Patient ID: Toni Cooper, female    DOB: 1946/01/08, 74 y.o.   MRN: [id number]   Chief Complaint: Medical Management of Chronic Issues    HPI:  1. Essential hypertension NO C/O CHEST PAIN, OB OR HEADACHE. DOES NOT CHECK BLOOD PREssURE AT HOME. BP Readings from Last 3 Encounters:  04/08/20 134/68  03/07/20 (!) 162/69  11/25/19 (!) 142/80     2. Mixed hyperlipidemia She does not watch diet and does no dedicated exercise. Lab Results  Component Value Date   CHOL 98 (L) 04/04/2017   HDL 38 (L) 04/04/2017   LDLCALC 49 04/04/2017   TRIG 54 04/04/2017   CHOLHDL 2.6 04/04/2017     3. Type 2 diabetes mellitus without complication, without long-term current use of insulin (HCC) She still does not check blood sugars at home very often. She sees DR. Balan, and just saw her on wedneday of this week. Her HGBA1c was 7.3% AND NO CHANGE WERE MADE TO PLAN OF CARE.  4. S/P minimally invasive aortic valve replacement with bioprosthetic valve She saw cardiology in January and was doing well. According to office note no change were made to plan of care.  5. Age-related osteoporosis without current pathological fracture Last dexacan was done on 11/28/16 with t score of -3.4. She is on daily vitamin d and calcium supplement but doe snot do much weight bearing exrcise.  6. Class 2 severe obesity due to excess calories with serious comorbidity and body mass index (BMI) of 38.0 to 38.9 in adult [facility]) No recent weight changes. Wt Readings from Last 3 Encounters:  04/08/20 221 lb (100.2 kg)  03/07/20 222 lb (100.7 kg)  11/25/19 219 lb (99.3 kg)   BMI Readings from Last 3 Encounters:  04/08/20 36.78 kg/m  03/07/20 36.94 kg/m  11/25/19 36.44 kg/m       Outpatient Encounter Medications as of 04/08/2020  Medication Sig  . acetaminophen (TYLENOL) 500 MG tablet Take 500 mg 2 (two) times daily as needed by mouth for moderate pain or headache.   . alendronate (FOSAMAX) 70 MG  tablet TAKE 1 TABLET BY MOUTH ONCE A WEEK. TAKE WITH A FULL  GLASS OF WATER ON AN EMPTY  STOMACH.  [name] Ascorbic Acid (VITAMIN C) 1000 MG tablet Take 1,000 mg daily by mouth.  [name] aspirin 81 MG EC tablet Take 1 tablet (81 mg total) by mouth daily.  [name] atorvastatin (LIPITOR) 10 MG tablet TAKE 1 TABLET BY MOUTH  DAILY AT 6PM.  . beta carotene w/minerals (OCUVITE) tablet Take 1 tablet by mouth daily.  . calcium carbonate (TUMS - DOSED IN MG ELEMENTAL CALCIUM) 500 MG chewable tablet Chew 1 tablet by mouth daily as needed for indigestion or heartburn.  . calcium-vitamin D (OSCAL 500/200 D-3) 500-200 MG-UNIT tablet Take 1 tablet by mouth 2 (two) times daily.  . cholecalciferol (VITAMIN D) 1000 UNITS tablet Take 1,000 Units by mouth daily.  . fluticasone (FLONASE) 50 MCG/ACT nasal spray Place 1 spray into both nostrils daily as needed for allergies or rhinitis.  [name] HUMALOG 100 UNIT/ML injection Inject 6-20 Units into the skin 3 (three) times daily with meals. Using sliding scale.  . hydrochlorothiazide (HYDRODIURIL) 25 MG tablet Take 1 tablet (25 mg total) by mouth daily.  [name] LANTUS 100 UNIT/ML injection Inject 35 Units into the skin at bedtime.   [name] lisinopril (ZESTRIL) 20 MG tablet Take 1 tablet (20 mg total) by mouth daily.  . Magnesium 250 MG TABS Take  250 mg by mouth daily.   . metFORMIN (GLUCOPHAGE-XR) 500 MG 24 hr tablet TAKE 1 TABLET BY MOUTH  TWICE DAILY  . metoprolol tartrate (LOPRESSOR) 25 MG tablet TAKE 1/2 TABLET TWICE DAILY  . Multiple Vitamins-Minerals (MULTI-BETIC DIABETES) TABS Take 1 tablet daily by mouth.  . mupirocin ointment (BACTROBAN) 2 % Place 1 application into the nose 2 (two) times daily.  [name] pyridOXINE (VITAMIN B-6) 100 MG tablet Take 100 mg by mouth daily.  [name] TEST test strip Test four times daily.  . [DISCONTINUED] cephALEXin (KEFLEX) 500 MG capsule Take 1 capsule (500 mg total) by mouth 3 (three) times daily.  [name] LORazepam (ATIVAN) 0.5 MG tablet Take 1 tablet (0.5 mg total)  by mouth 2 (two) times daily as needed for anxiety. (Patient not taking: Reported on 04/08/2020)     Past Surgical History:  Procedure Laterality Date  . ABDOMINAL HYSTERECTOMY    . APPENDECTOMY    . FRACTURE SURGERY     shoulder  . TEE WITHOUT CARDIOVERSION N/A 04/18/2016   Procedure: TRANSESOPHAGEAL ECHOCARDIOGRAM (TEE);  Surgeon: Thayer Headings, MD;  Location: [place];  Service: Cardiovascular;  Laterality: N/A;  . TEE WITHOUT CARDIOVERSION N/A 10/16/2017   Procedure: TRANSESOPHAGEAL ECHOCARDIOGRAM (TEE);  Surgeon: Rexene Alberts, MD;  Location: [place];  Service: Open Heart Surgery;  Laterality: N/A;  . WRIST SURGERY      Family History  Problem Relation Age of Onset  . Stroke Father 50  . Diabetes Father   . Retinopathy of prematurity Father   . Heart failure Mother   . Heart attack Maternal Grandmother   . Heart attack Paternal Grandmother   . Diabetes Paternal Aunt   . Diabetes Paternal Uncle     New complaints: None today   Social history: Lives with husband  Controlled substance contract: n/a    Review of Systems  Constitutional: Negative for diaphoresis.  Eyes: Negative for pain.  Respiratory: Negative for shortness of breath.   Cardiovascular: Negative for chest pain, palpitations and leg swelling.  Gastrointestinal: Negative for abdominal pain.  Endocrine: Negative for polydipsia.  Skin: Negative for rash.  Neurological: Negative for dizziness, weakness and headaches.  Hematological: Does not bruise/bleed easily.  All other systems reviewed and are negative.      Objective:   Physical Exam Vitals and nursing note reviewed.  Constitutional:      General: She is not in acute distress.    Appearance: Normal appearance. She is well-developed.  HENT:     Head: Normocephalic.     Nose: Nose normal.  Eyes:     Pupils: Pupils are equal, round, and reactive to light.  Neck:     Vascular: No carotid bruit or JVD.  Cardiovascular:     Rate and  Rhythm: Normal rate and regular rhythm.     Heart sounds: Murmur (3/6 systolic murmur) present.  Pulmonary:     Effort: Pulmonary effort is normal. No respiratory distress.     Breath sounds: Normal breath sounds. No wheezing or rales.  Chest:     Chest wall: No tenderness.  Abdominal:     General: Bowel sounds are normal. There is no distension or abdominal bruit.     Palpations: Abdomen is soft. There is no hepatomegaly, splenomegaly, mass or pulsatile mass.     Tenderness: There is no abdominal tenderness.  Musculoskeletal:        General: Normal range of motion.     Cervical back: Normal range of motion  and neck supple.     Right lower leg: Edema (1+) present.     Left lower leg: Edema (1+) present.     Comments: Knee high compression hose  Lymphadenopathy:     Cervical: No cervical adenopathy.  Skin:    General: Skin is warm and dry.  Neurological:     Mental Status: She is alert and oriented to person, place, and time.     Deep Tendon Reflexes: Reflexes are normal and symmetric.  Psychiatric:        Behavior: Behavior normal.        Thought Content: Thought content normal.        Judgment: Judgment normal.    BP 134/68   Pulse 66   Temp 98.4 F (36.9 C) (Temporal)   Resp 20   Ht 5\' 5"  (1.651 m)   Wt 221 lb (100.2 kg)   SpO2 99%   BMI 36.78 kg/m         Assessment & Plan:  CECY GRIDLEY comes in today with chief complaint of Medical Management of Chronic Issues   Diagnosis and orders addressed:  1. Essential hypertension Low sodium diet - hydrochlorothiazide (HYDRODIURIL) 25 MG tablet; Take 1 tablet (25 mg total) by mouth daily.  Dispense: 90 tablet; Refill: 1 - lisinopril (ZESTRIL) 20 MG tablet; Take 1 tablet (20 mg total) by mouth daily.  Dispense: 90 tablet; Refill: 1  2. Mixed hyperlipidemia Low fat diet - atorvastatin (LIPITOR) 10 MG tablet; Take 1 tablet (10 mg total) by mouth daily.  Dispense: 90 tablet; Refill: 1  3. Type 2 diabetes mellitus  without complication, without long-term current use of insulin (HCC) Continue to watch carbs in diet - metFORMIN (GLUCOPHAGE-XR) 500 MG 24 hr tablet; Take 1 tablet (500 mg total) by mouth 2 (two) times daily.  Dispense: 180 tablet; Refill: 1  4. S/P minimally invasive aortic valve replacement with bioprosthetic valve Keep yearly follow up with cardiology  5. Age-related osteoporosis without current pathological fracture weight bearing exercise  6. Class 2 severe obesity due to excess calories with serious comorbidity and body mass index (BMI) of 38.0 to 38.9 in adult [facility]) Discussed diet and exercise for person with BMI >25 Will recheck weight in 3-6 months  7. Tremor of both hands  - LORazepam (ATIVAN) 0.5 MG tablet; Take 1 tablet (0.5 mg total) by mouth 2 (two) times daily as needed for anxiety.  Dispense: 60 tablet; Refill: 2   Labs pending Health Maintenance reviewed Diet and exercise encouraged  Follow up plan: 6 month   [place], FNP

## 2020-04-08 NOTE — Patient Instructions (Signed)

## 2020-04-28 ENCOUNTER — Other Ambulatory Visit: Payer: Self-pay

## 2020-04-28 ENCOUNTER — Encounter: Payer: Self-pay | Admitting: Dermatology

## 2020-04-28 ENCOUNTER — Ambulatory Visit (INDEPENDENT_AMBULATORY_CARE_PROVIDER_SITE_OTHER): Payer: Medicare PPO | Admitting: Dermatology

## 2020-04-28 DIAGNOSIS — C4492 Squamous cell carcinoma of skin, unspecified: Secondary | ICD-10-CM

## 2020-04-28 DIAGNOSIS — C44729 Squamous cell carcinoma of skin of left lower limb, including hip: Secondary | ICD-10-CM | POA: Diagnosis not present

## 2020-04-28 NOTE — Patient Instructions (Signed)

## 2020-05-12 ENCOUNTER — Encounter: Payer: Self-pay | Admitting: Dermatology

## 2020-05-13 DIAGNOSIS — Z1231 Encounter for screening mammogram for malignant neoplasm of breast: Secondary | ICD-10-CM | POA: Diagnosis not present

## 2020-05-29 ENCOUNTER — Encounter: Payer: Self-pay | Admitting: Dermatology

## 2020-05-29 NOTE — Progress Notes (Signed)
Follow-Up Visit   Subjective  Toni Cooper is a 74 y.o. female who presents for the following: Procedure (here for treatment- left thigh-anterior-Scc(KA)).  SCCA Location: Left thigh Duration:  Quality:  Associated Signs/Symptoms: Modifying Factors:  Severity:  Timing: Context: For treatment  Objective  Well appearing patient in no apparent distress; mood and affect are within normal limits.  A focused examination was performed including Legs.. Relevant physical exam findings are noted in the Assessment and Plan.   Assessment & Plan   Patient Instructions  Biopsy, Surgery (Curettage) & Surgery (Excision) Aftercare Instructions  1. Okay to remove bandage in 24 hours  2. Wash area with soap and water  3. Apply Vaseline to area twice daily until healed (Not Neosporin)  4. Okay to cover with a Band-Aid to decrease the chance of infection or prevent irritation from clothing; also it's okay to uncover lesion at home.  5. Suture instructions: return to our office in 7-10 or 10-14 days for a nurse visit for suture removal. Variable healing with sutures, if pain or itching occurs call our office. It's okay to shower or bathe 24 hours after sutures are given.  6. The following risks may occur after a biopsy, curettage or excision: bleeding, scarring, discoloration, recurrence, infection (redness, yellow drainage, pain or swelling).  7. For questions, concerns and results call our office at [place] before 4pm & Friday before 3pm. Biopsy results will be available in 1 week.     Squamous cell carcinoma of skin (2) Left Thigh - Anterior  Skin excision  Destruction of lesion Complexity: simple   Destruction method: electrodesiccation and curettage   Informed consent: discussed and consent obtained   Timeout:  patient name, date of birth, surgical site, and procedure verified Anesthesia: the lesion was anesthetized in a standard fashion   Anesthetic:  1% lidocaine w/  epinephrine 1-100,000 local infiltration Curettage performed in three different directions: Yes   Curettage cycles:  3 Lesion length (cm):  2.2 Lesion width (cm):  1 Margin per side (cm):  0 Final wound size (cm):  2.2 Hemostasis achieved with:  ferric subsulfate Outcome: patient tolerated procedure well with no complications   Post-procedure details: wound care instructions given   Additional details:  Inoculated with parenteral 5% fluorouracil  Destruction of lesion  Destruction method: electrodesiccation and curettage   Informed consent: discussed and consent obtained   Timeout:  patient name, date of birth, surgical site, and procedure verified Anesthesia: the lesion was anesthetized in a standard fashion   Anesthetic:  1% lidocaine w/ epinephrine 1-100,000 local infiltration Curettage performed in three different directions: Yes   Curettage cycles:  3 Lesion length (cm):  2.2 Lesion width (cm):  1 Margin per side (cm):  0 Final wound size (cm):  2.2 Hemostasis achieved with:  ferric subsulfate Outcome: patient tolerated procedure well with no complications   Post-procedure details: wound care instructions given   Additional details:  Inoculated with parenteral 5% fluorouracil  Left Thigh - Anterior  Destruction of lesion Complexity: simple   Destruction method: electrodesiccation and curettage   Informed consent: discussed and consent obtained   Timeout:  patient name, date of birth, surgical site, and procedure verified Anesthesia: the lesion was anesthetized in a standard fashion   Anesthetic:  1% lidocaine w/ epinephrine 1-100,000 local infiltration Curettage performed in three different directions: Yes   Curettage cycles:  3 Lesion length (cm):  2.2 Lesion width (cm):  1 Margin per side (cm):  0 Final wound  size (cm):  2.2 Hemostasis achieved with:  ferric subsulfate Outcome: patient tolerated procedure well with no complications   Post-procedure details: wound  care instructions given   Additional details:  Inoculated with parenteral 5% fluorouracil     I, Lavonna Monarch, MD, have reviewed all documentation for this visit.  The documentation on 05/29/20 for the exam, diagnosis, procedures, and orders are all accurate and complete.

## 2020-06-08 DIAGNOSIS — E119 Type 2 diabetes mellitus without complications: Secondary | ICD-10-CM | POA: Diagnosis not present

## 2020-06-08 DIAGNOSIS — H40023 Open angle with borderline findings, high risk, bilateral: Secondary | ICD-10-CM | POA: Diagnosis not present

## 2020-06-08 DIAGNOSIS — H25813 Combined forms of age-related cataract, bilateral: Secondary | ICD-10-CM | POA: Diagnosis not present

## 2020-06-27 NOTE — Addendum Note (Signed)
Addended by: Lavonna Monarch on: 06/27/2020 07:53 AM   Modules accepted: Orders

## 2020-07-18 ENCOUNTER — Ambulatory Visit: Payer: Medicare PPO | Admitting: Dermatology

## 2020-07-18 ENCOUNTER — Ambulatory Visit: Payer: Self-pay | Admitting: Dermatology

## 2020-07-30 DIAGNOSIS — E785 Hyperlipidemia, unspecified: Secondary | ICD-10-CM | POA: Diagnosis not present

## 2020-07-30 DIAGNOSIS — W1839XA Other fall on same level, initial encounter: Secondary | ICD-10-CM | POA: Diagnosis not present

## 2020-07-30 DIAGNOSIS — S7221XA Displaced subtrochanteric fracture of right femur, initial encounter for closed fracture: Secondary | ICD-10-CM | POA: Diagnosis not present

## 2020-07-30 DIAGNOSIS — I350 Nonrheumatic aortic (valve) stenosis: Secondary | ICD-10-CM | POA: Diagnosis not present

## 2020-07-30 DIAGNOSIS — S72001A Fracture of unspecified part of neck of right femur, initial encounter for closed fracture: Secondary | ICD-10-CM | POA: Diagnosis not present

## 2020-07-30 DIAGNOSIS — S7291XA Unspecified fracture of right femur, initial encounter for closed fracture: Secondary | ICD-10-CM | POA: Diagnosis not present

## 2020-07-30 DIAGNOSIS — E1165 Type 2 diabetes mellitus with hyperglycemia: Secondary | ICD-10-CM | POA: Diagnosis not present

## 2020-07-30 DIAGNOSIS — M2170 Unequal limb length (acquired), unspecified site: Secondary | ICD-10-CM | POA: Diagnosis not present

## 2020-07-30 DIAGNOSIS — W19XXXA Unspecified fall, initial encounter: Secondary | ICD-10-CM | POA: Diagnosis not present

## 2020-07-30 DIAGNOSIS — Z953 Presence of xenogenic heart valve: Secondary | ICD-10-CM | POA: Diagnosis not present

## 2020-07-30 DIAGNOSIS — W1830XA Fall on same level, unspecified, initial encounter: Secondary | ICD-10-CM | POA: Diagnosis not present

## 2020-07-30 DIAGNOSIS — Z794 Long term (current) use of insulin: Secondary | ICD-10-CM | POA: Diagnosis not present

## 2020-07-30 DIAGNOSIS — M25551 Pain in right hip: Secondary | ICD-10-CM | POA: Diagnosis not present

## 2020-07-30 DIAGNOSIS — Y998 Other external cause status: Secondary | ICD-10-CM | POA: Diagnosis not present

## 2020-07-30 DIAGNOSIS — E1169 Type 2 diabetes mellitus with other specified complication: Secondary | ICD-10-CM | POA: Diagnosis not present

## 2020-07-30 DIAGNOSIS — M80051A Age-related osteoporosis with current pathological fracture, right femur, initial encounter for fracture: Secondary | ICD-10-CM | POA: Diagnosis not present

## 2020-07-30 DIAGNOSIS — Z043 Encounter for examination and observation following other accident: Secondary | ICD-10-CM | POA: Diagnosis not present

## 2020-07-30 DIAGNOSIS — E782 Mixed hyperlipidemia: Secondary | ICD-10-CM | POA: Diagnosis not present

## 2020-07-30 DIAGNOSIS — I10 Essential (primary) hypertension: Secondary | ICD-10-CM | POA: Diagnosis not present

## 2020-07-30 DIAGNOSIS — S72301A Unspecified fracture of shaft of right femur, initial encounter for closed fracture: Secondary | ICD-10-CM | POA: Diagnosis not present

## 2020-07-30 DIAGNOSIS — E119 Type 2 diabetes mellitus without complications: Secondary | ICD-10-CM | POA: Diagnosis not present

## 2020-07-30 DIAGNOSIS — Z79899 Other long term (current) drug therapy: Secondary | ICD-10-CM | POA: Diagnosis not present

## 2020-07-30 DIAGNOSIS — Z20822 Contact with and (suspected) exposure to covid-19: Secondary | ICD-10-CM | POA: Diagnosis not present

## 2020-07-30 DIAGNOSIS — Z952 Presence of prosthetic heart valve: Secondary | ICD-10-CM | POA: Diagnosis not present

## 2020-07-30 HISTORY — PX: FEMUR FRACTURE SURGERY: SHX633

## 2020-08-02 DIAGNOSIS — D72829 Elevated white blood cell count, unspecified: Secondary | ICD-10-CM | POA: Insufficient documentation

## 2020-08-05 DIAGNOSIS — E119 Type 2 diabetes mellitus without complications: Secondary | ICD-10-CM | POA: Diagnosis not present

## 2020-08-05 DIAGNOSIS — M80051D Age-related osteoporosis with current pathological fracture, right femur, subsequent encounter for fracture with routine healing: Secondary | ICD-10-CM | POA: Diagnosis not present

## 2020-08-05 DIAGNOSIS — I10 Essential (primary) hypertension: Secondary | ICD-10-CM | POA: Diagnosis not present

## 2020-08-05 DIAGNOSIS — Z7982 Long term (current) use of aspirin: Secondary | ICD-10-CM | POA: Diagnosis not present

## 2020-08-05 DIAGNOSIS — Z7984 Long term (current) use of oral hypoglycemic drugs: Secondary | ICD-10-CM | POA: Diagnosis not present

## 2020-08-09 DIAGNOSIS — M80051D Age-related osteoporosis with current pathological fracture, right femur, subsequent encounter for fracture with routine healing: Secondary | ICD-10-CM | POA: Diagnosis not present

## 2020-08-09 DIAGNOSIS — I10 Essential (primary) hypertension: Secondary | ICD-10-CM | POA: Diagnosis not present

## 2020-08-09 DIAGNOSIS — Z7984 Long term (current) use of oral hypoglycemic drugs: Secondary | ICD-10-CM | POA: Diagnosis not present

## 2020-08-09 DIAGNOSIS — Z7982 Long term (current) use of aspirin: Secondary | ICD-10-CM | POA: Diagnosis not present

## 2020-08-09 DIAGNOSIS — E119 Type 2 diabetes mellitus without complications: Secondary | ICD-10-CM | POA: Diagnosis not present

## 2020-08-11 DIAGNOSIS — I10 Essential (primary) hypertension: Secondary | ICD-10-CM | POA: Diagnosis not present

## 2020-08-11 DIAGNOSIS — Z7982 Long term (current) use of aspirin: Secondary | ICD-10-CM | POA: Diagnosis not present

## 2020-08-11 DIAGNOSIS — E119 Type 2 diabetes mellitus without complications: Secondary | ICD-10-CM | POA: Diagnosis not present

## 2020-08-11 DIAGNOSIS — Z7984 Long term (current) use of oral hypoglycemic drugs: Secondary | ICD-10-CM | POA: Diagnosis not present

## 2020-08-11 DIAGNOSIS — M80051D Age-related osteoporosis with current pathological fracture, right femur, subsequent encounter for fracture with routine healing: Secondary | ICD-10-CM | POA: Diagnosis not present

## 2020-08-12 DIAGNOSIS — Z7984 Long term (current) use of oral hypoglycemic drugs: Secondary | ICD-10-CM | POA: Diagnosis not present

## 2020-08-12 DIAGNOSIS — M80051D Age-related osteoporosis with current pathological fracture, right femur, subsequent encounter for fracture with routine healing: Secondary | ICD-10-CM | POA: Diagnosis not present

## 2020-08-12 DIAGNOSIS — Z7982 Long term (current) use of aspirin: Secondary | ICD-10-CM | POA: Diagnosis not present

## 2020-08-12 DIAGNOSIS — I10 Essential (primary) hypertension: Secondary | ICD-10-CM | POA: Diagnosis not present

## 2020-08-12 DIAGNOSIS — E119 Type 2 diabetes mellitus without complications: Secondary | ICD-10-CM | POA: Diagnosis not present

## 2020-08-15 DIAGNOSIS — Z4802 Encounter for removal of sutures: Secondary | ICD-10-CM | POA: Diagnosis not present

## 2020-08-16 DIAGNOSIS — Z7984 Long term (current) use of oral hypoglycemic drugs: Secondary | ICD-10-CM | POA: Diagnosis not present

## 2020-08-16 DIAGNOSIS — M80051D Age-related osteoporosis with current pathological fracture, right femur, subsequent encounter for fracture with routine healing: Secondary | ICD-10-CM | POA: Diagnosis not present

## 2020-08-16 DIAGNOSIS — Z7982 Long term (current) use of aspirin: Secondary | ICD-10-CM | POA: Diagnosis not present

## 2020-08-16 DIAGNOSIS — E119 Type 2 diabetes mellitus without complications: Secondary | ICD-10-CM | POA: Diagnosis not present

## 2020-08-16 DIAGNOSIS — I10 Essential (primary) hypertension: Secondary | ICD-10-CM | POA: Diagnosis not present

## 2020-08-18 DIAGNOSIS — E119 Type 2 diabetes mellitus without complications: Secondary | ICD-10-CM | POA: Diagnosis not present

## 2020-08-18 DIAGNOSIS — M80051D Age-related osteoporosis with current pathological fracture, right femur, subsequent encounter for fracture with routine healing: Secondary | ICD-10-CM | POA: Diagnosis not present

## 2020-08-18 DIAGNOSIS — Z7984 Long term (current) use of oral hypoglycemic drugs: Secondary | ICD-10-CM | POA: Diagnosis not present

## 2020-08-18 DIAGNOSIS — I10 Essential (primary) hypertension: Secondary | ICD-10-CM | POA: Diagnosis not present

## 2020-08-18 DIAGNOSIS — Z7982 Long term (current) use of aspirin: Secondary | ICD-10-CM | POA: Diagnosis not present

## 2020-08-22 DIAGNOSIS — E119 Type 2 diabetes mellitus without complications: Secondary | ICD-10-CM | POA: Diagnosis not present

## 2020-08-22 DIAGNOSIS — I10 Essential (primary) hypertension: Secondary | ICD-10-CM | POA: Diagnosis not present

## 2020-08-22 DIAGNOSIS — M80051D Age-related osteoporosis with current pathological fracture, right femur, subsequent encounter for fracture with routine healing: Secondary | ICD-10-CM | POA: Diagnosis not present

## 2020-08-22 DIAGNOSIS — Z7984 Long term (current) use of oral hypoglycemic drugs: Secondary | ICD-10-CM | POA: Diagnosis not present

## 2020-08-22 DIAGNOSIS — Z7982 Long term (current) use of aspirin: Secondary | ICD-10-CM | POA: Diagnosis not present

## 2020-08-24 DIAGNOSIS — I10 Essential (primary) hypertension: Secondary | ICD-10-CM | POA: Diagnosis not present

## 2020-08-24 DIAGNOSIS — M80051D Age-related osteoporosis with current pathological fracture, right femur, subsequent encounter for fracture with routine healing: Secondary | ICD-10-CM | POA: Diagnosis not present

## 2020-08-24 DIAGNOSIS — E119 Type 2 diabetes mellitus without complications: Secondary | ICD-10-CM | POA: Diagnosis not present

## 2020-08-24 DIAGNOSIS — Z7984 Long term (current) use of oral hypoglycemic drugs: Secondary | ICD-10-CM | POA: Diagnosis not present

## 2020-08-24 DIAGNOSIS — Z7982 Long term (current) use of aspirin: Secondary | ICD-10-CM | POA: Diagnosis not present

## 2020-08-30 DIAGNOSIS — Z7982 Long term (current) use of aspirin: Secondary | ICD-10-CM | POA: Diagnosis not present

## 2020-08-30 DIAGNOSIS — E119 Type 2 diabetes mellitus without complications: Secondary | ICD-10-CM | POA: Diagnosis not present

## 2020-08-30 DIAGNOSIS — Z7984 Long term (current) use of oral hypoglycemic drugs: Secondary | ICD-10-CM | POA: Diagnosis not present

## 2020-08-30 DIAGNOSIS — I10 Essential (primary) hypertension: Secondary | ICD-10-CM | POA: Diagnosis not present

## 2020-08-30 DIAGNOSIS — M80051D Age-related osteoporosis with current pathological fracture, right femur, subsequent encounter for fracture with routine healing: Secondary | ICD-10-CM | POA: Diagnosis not present

## 2020-08-31 DIAGNOSIS — M80051D Age-related osteoporosis with current pathological fracture, right femur, subsequent encounter for fracture with routine healing: Secondary | ICD-10-CM | POA: Diagnosis not present

## 2020-08-31 DIAGNOSIS — I10 Essential (primary) hypertension: Secondary | ICD-10-CM | POA: Diagnosis not present

## 2020-08-31 DIAGNOSIS — E119 Type 2 diabetes mellitus without complications: Secondary | ICD-10-CM | POA: Diagnosis not present

## 2020-08-31 DIAGNOSIS — Z7982 Long term (current) use of aspirin: Secondary | ICD-10-CM | POA: Diagnosis not present

## 2020-08-31 DIAGNOSIS — Z7984 Long term (current) use of oral hypoglycemic drugs: Secondary | ICD-10-CM | POA: Diagnosis not present

## 2020-09-01 DIAGNOSIS — S7291XA Unspecified fracture of right femur, initial encounter for closed fracture: Secondary | ICD-10-CM | POA: Diagnosis not present

## 2020-09-04 DIAGNOSIS — Z7982 Long term (current) use of aspirin: Secondary | ICD-10-CM | POA: Diagnosis not present

## 2020-09-04 DIAGNOSIS — M80051D Age-related osteoporosis with current pathological fracture, right femur, subsequent encounter for fracture with routine healing: Secondary | ICD-10-CM | POA: Diagnosis not present

## 2020-09-04 DIAGNOSIS — E119 Type 2 diabetes mellitus without complications: Secondary | ICD-10-CM | POA: Diagnosis not present

## 2020-09-04 DIAGNOSIS — Z7984 Long term (current) use of oral hypoglycemic drugs: Secondary | ICD-10-CM | POA: Diagnosis not present

## 2020-09-04 DIAGNOSIS — I10 Essential (primary) hypertension: Secondary | ICD-10-CM | POA: Diagnosis not present

## 2020-09-05 ENCOUNTER — Ambulatory Visit: Payer: Self-pay | Admitting: Dermatology

## 2020-09-05 DIAGNOSIS — M80051D Age-related osteoporosis with current pathological fracture, right femur, subsequent encounter for fracture with routine healing: Secondary | ICD-10-CM | POA: Diagnosis not present

## 2020-09-05 DIAGNOSIS — E119 Type 2 diabetes mellitus without complications: Secondary | ICD-10-CM | POA: Diagnosis not present

## 2020-09-05 DIAGNOSIS — Z7984 Long term (current) use of oral hypoglycemic drugs: Secondary | ICD-10-CM | POA: Diagnosis not present

## 2020-09-05 DIAGNOSIS — Z7982 Long term (current) use of aspirin: Secondary | ICD-10-CM | POA: Diagnosis not present

## 2020-09-05 DIAGNOSIS — I10 Essential (primary) hypertension: Secondary | ICD-10-CM | POA: Diagnosis not present

## 2020-09-07 DIAGNOSIS — I10 Essential (primary) hypertension: Secondary | ICD-10-CM | POA: Diagnosis not present

## 2020-09-07 DIAGNOSIS — M80051D Age-related osteoporosis with current pathological fracture, right femur, subsequent encounter for fracture with routine healing: Secondary | ICD-10-CM | POA: Diagnosis not present

## 2020-09-07 DIAGNOSIS — E119 Type 2 diabetes mellitus without complications: Secondary | ICD-10-CM | POA: Diagnosis not present

## 2020-09-07 DIAGNOSIS — Z7982 Long term (current) use of aspirin: Secondary | ICD-10-CM | POA: Diagnosis not present

## 2020-09-07 DIAGNOSIS — Z7984 Long term (current) use of oral hypoglycemic drugs: Secondary | ICD-10-CM | POA: Diagnosis not present

## 2020-09-13 DIAGNOSIS — E119 Type 2 diabetes mellitus without complications: Secondary | ICD-10-CM | POA: Diagnosis not present

## 2020-09-13 DIAGNOSIS — Z7982 Long term (current) use of aspirin: Secondary | ICD-10-CM | POA: Diagnosis not present

## 2020-09-13 DIAGNOSIS — I10 Essential (primary) hypertension: Secondary | ICD-10-CM | POA: Diagnosis not present

## 2020-09-13 DIAGNOSIS — M80051D Age-related osteoporosis with current pathological fracture, right femur, subsequent encounter for fracture with routine healing: Secondary | ICD-10-CM | POA: Diagnosis not present

## 2020-09-13 DIAGNOSIS — Z7984 Long term (current) use of oral hypoglycemic drugs: Secondary | ICD-10-CM | POA: Diagnosis not present

## 2020-09-14 DIAGNOSIS — S72001D Fracture of unspecified part of neck of right femur, subsequent encounter for closed fracture with routine healing: Secondary | ICD-10-CM | POA: Diagnosis not present

## 2020-09-14 DIAGNOSIS — Z9689 Presence of other specified functional implants: Secondary | ICD-10-CM | POA: Diagnosis not present

## 2020-09-14 DIAGNOSIS — M1611 Unilateral primary osteoarthritis, right hip: Secondary | ICD-10-CM | POA: Diagnosis not present

## 2020-09-14 DIAGNOSIS — M80051D Age-related osteoporosis with current pathological fracture, right femur, subsequent encounter for fracture with routine healing: Secondary | ICD-10-CM | POA: Diagnosis not present

## 2020-09-15 DIAGNOSIS — M80051D Age-related osteoporosis with current pathological fracture, right femur, subsequent encounter for fracture with routine healing: Secondary | ICD-10-CM | POA: Diagnosis not present

## 2020-09-15 DIAGNOSIS — E119 Type 2 diabetes mellitus without complications: Secondary | ICD-10-CM | POA: Diagnosis not present

## 2020-09-15 DIAGNOSIS — Z7982 Long term (current) use of aspirin: Secondary | ICD-10-CM | POA: Diagnosis not present

## 2020-09-15 DIAGNOSIS — Z7984 Long term (current) use of oral hypoglycemic drugs: Secondary | ICD-10-CM | POA: Diagnosis not present

## 2020-09-15 DIAGNOSIS — I10 Essential (primary) hypertension: Secondary | ICD-10-CM | POA: Diagnosis not present

## 2020-09-19 DIAGNOSIS — Z7982 Long term (current) use of aspirin: Secondary | ICD-10-CM | POA: Diagnosis not present

## 2020-09-19 DIAGNOSIS — I10 Essential (primary) hypertension: Secondary | ICD-10-CM | POA: Diagnosis not present

## 2020-09-19 DIAGNOSIS — E119 Type 2 diabetes mellitus without complications: Secondary | ICD-10-CM | POA: Diagnosis not present

## 2020-09-19 DIAGNOSIS — Z7984 Long term (current) use of oral hypoglycemic drugs: Secondary | ICD-10-CM | POA: Diagnosis not present

## 2020-09-19 DIAGNOSIS — M80051D Age-related osteoporosis with current pathological fracture, right femur, subsequent encounter for fracture with routine healing: Secondary | ICD-10-CM | POA: Diagnosis not present

## 2020-09-21 DIAGNOSIS — Z7984 Long term (current) use of oral hypoglycemic drugs: Secondary | ICD-10-CM | POA: Diagnosis not present

## 2020-09-21 DIAGNOSIS — M80051D Age-related osteoporosis with current pathological fracture, right femur, subsequent encounter for fracture with routine healing: Secondary | ICD-10-CM | POA: Diagnosis not present

## 2020-09-21 DIAGNOSIS — E119 Type 2 diabetes mellitus without complications: Secondary | ICD-10-CM | POA: Diagnosis not present

## 2020-09-21 DIAGNOSIS — I10 Essential (primary) hypertension: Secondary | ICD-10-CM | POA: Diagnosis not present

## 2020-09-21 DIAGNOSIS — Z7982 Long term (current) use of aspirin: Secondary | ICD-10-CM | POA: Diagnosis not present

## 2020-09-23 ENCOUNTER — Other Ambulatory Visit: Payer: Self-pay

## 2020-09-23 ENCOUNTER — Ambulatory Visit (INDEPENDENT_AMBULATORY_CARE_PROVIDER_SITE_OTHER): Payer: Medicare PPO

## 2020-09-23 DIAGNOSIS — Z23 Encounter for immunization: Secondary | ICD-10-CM

## 2020-09-26 DIAGNOSIS — Z7982 Long term (current) use of aspirin: Secondary | ICD-10-CM | POA: Diagnosis not present

## 2020-09-26 DIAGNOSIS — Z7984 Long term (current) use of oral hypoglycemic drugs: Secondary | ICD-10-CM | POA: Diagnosis not present

## 2020-09-26 DIAGNOSIS — E119 Type 2 diabetes mellitus without complications: Secondary | ICD-10-CM | POA: Diagnosis not present

## 2020-09-26 DIAGNOSIS — M80051D Age-related osteoporosis with current pathological fracture, right femur, subsequent encounter for fracture with routine healing: Secondary | ICD-10-CM | POA: Diagnosis not present

## 2020-09-26 DIAGNOSIS — I10 Essential (primary) hypertension: Secondary | ICD-10-CM | POA: Diagnosis not present

## 2020-09-29 DIAGNOSIS — M80051D Age-related osteoporosis with current pathological fracture, right femur, subsequent encounter for fracture with routine healing: Secondary | ICD-10-CM | POA: Diagnosis not present

## 2020-09-29 DIAGNOSIS — Z7982 Long term (current) use of aspirin: Secondary | ICD-10-CM | POA: Diagnosis not present

## 2020-09-29 DIAGNOSIS — Z7984 Long term (current) use of oral hypoglycemic drugs: Secondary | ICD-10-CM | POA: Diagnosis not present

## 2020-09-29 DIAGNOSIS — E119 Type 2 diabetes mellitus without complications: Secondary | ICD-10-CM | POA: Diagnosis not present

## 2020-09-29 DIAGNOSIS — I10 Essential (primary) hypertension: Secondary | ICD-10-CM | POA: Diagnosis not present

## 2020-09-30 DIAGNOSIS — G609 Hereditary and idiopathic neuropathy, unspecified: Secondary | ICD-10-CM | POA: Diagnosis not present

## 2020-09-30 DIAGNOSIS — E7800 Pure hypercholesterolemia, unspecified: Secondary | ICD-10-CM | POA: Diagnosis not present

## 2020-09-30 DIAGNOSIS — E1165 Type 2 diabetes mellitus with hyperglycemia: Secondary | ICD-10-CM | POA: Diagnosis not present

## 2020-10-02 DIAGNOSIS — S7291XA Unspecified fracture of right femur, initial encounter for closed fracture: Secondary | ICD-10-CM | POA: Diagnosis not present

## 2020-10-04 DIAGNOSIS — I10 Essential (primary) hypertension: Secondary | ICD-10-CM | POA: Diagnosis not present

## 2020-10-04 DIAGNOSIS — M80051D Age-related osteoporosis with current pathological fracture, right femur, subsequent encounter for fracture with routine healing: Secondary | ICD-10-CM | POA: Diagnosis not present

## 2020-10-04 DIAGNOSIS — E119 Type 2 diabetes mellitus without complications: Secondary | ICD-10-CM | POA: Diagnosis not present

## 2020-10-04 DIAGNOSIS — Z7984 Long term (current) use of oral hypoglycemic drugs: Secondary | ICD-10-CM | POA: Diagnosis not present

## 2020-10-04 DIAGNOSIS — Z7982 Long term (current) use of aspirin: Secondary | ICD-10-CM | POA: Diagnosis not present

## 2020-10-06 DIAGNOSIS — I10 Essential (primary) hypertension: Secondary | ICD-10-CM | POA: Diagnosis not present

## 2020-10-06 DIAGNOSIS — M80051D Age-related osteoporosis with current pathological fracture, right femur, subsequent encounter for fracture with routine healing: Secondary | ICD-10-CM | POA: Diagnosis not present

## 2020-10-06 DIAGNOSIS — Z7982 Long term (current) use of aspirin: Secondary | ICD-10-CM | POA: Diagnosis not present

## 2020-10-06 DIAGNOSIS — E119 Type 2 diabetes mellitus without complications: Secondary | ICD-10-CM | POA: Diagnosis not present

## 2020-10-06 DIAGNOSIS — Z7984 Long term (current) use of oral hypoglycemic drugs: Secondary | ICD-10-CM | POA: Diagnosis not present

## 2020-10-07 DIAGNOSIS — G609 Hereditary and idiopathic neuropathy, unspecified: Secondary | ICD-10-CM | POA: Diagnosis not present

## 2020-10-07 DIAGNOSIS — M899 Disorder of bone, unspecified: Secondary | ICD-10-CM | POA: Diagnosis not present

## 2020-10-07 DIAGNOSIS — I10 Essential (primary) hypertension: Secondary | ICD-10-CM | POA: Diagnosis not present

## 2020-10-07 DIAGNOSIS — E7800 Pure hypercholesterolemia, unspecified: Secondary | ICD-10-CM | POA: Diagnosis not present

## 2020-10-07 DIAGNOSIS — E559 Vitamin D deficiency, unspecified: Secondary | ICD-10-CM | POA: Diagnosis not present

## 2020-10-07 DIAGNOSIS — E1165 Type 2 diabetes mellitus with hyperglycemia: Secondary | ICD-10-CM | POA: Diagnosis not present

## 2020-10-11 ENCOUNTER — Encounter: Payer: Self-pay | Admitting: Nurse Practitioner

## 2020-10-11 ENCOUNTER — Other Ambulatory Visit: Payer: Self-pay

## 2020-10-11 ENCOUNTER — Ambulatory Visit: Payer: Medicare PPO | Admitting: Nurse Practitioner

## 2020-10-11 VITALS — BP 147/69 | HR 64 | Temp 98.4°F | Resp 20 | Ht 65.0 in | Wt 208.0 lb

## 2020-10-11 DIAGNOSIS — E785 Hyperlipidemia, unspecified: Secondary | ICD-10-CM

## 2020-10-11 DIAGNOSIS — Z6838 Body mass index (BMI) 38.0-38.9, adult: Secondary | ICD-10-CM

## 2020-10-11 DIAGNOSIS — E782 Mixed hyperlipidemia: Secondary | ICD-10-CM

## 2020-10-11 DIAGNOSIS — E119 Type 2 diabetes mellitus without complications: Secondary | ICD-10-CM

## 2020-10-11 DIAGNOSIS — I10 Essential (primary) hypertension: Secondary | ICD-10-CM | POA: Diagnosis not present

## 2020-10-11 DIAGNOSIS — M810 Age-related osteoporosis without current pathological fracture: Secondary | ICD-10-CM

## 2020-10-11 MED ORDER — ATORVASTATIN CALCIUM 10 MG PO TABS: 10.0000 mg | ORAL_TABLET | Freq: Every day | ORAL | 1 refills | Status: DC

## 2020-10-11 MED ORDER — LISINOPRIL 20 MG PO TABS: 20.0000 mg | ORAL_TABLET | Freq: Every day | ORAL | 1 refills | Status: DC

## 2020-10-11 MED ORDER — HYDROCHLOROTHIAZIDE 25 MG PO TABS: 25.0000 mg | ORAL_TABLET | Freq: Every day | ORAL | 1 refills | Status: DC

## 2020-10-11 NOTE — Progress Notes (Signed)
Subjective:    Patient ID: Toni Cooper, female    DOB: 1946/06/24, 74 y.o.   MRN: [id number]   Chief Complaint: Medical Management of Chronic Conditions   HPI:  1. Primary hypertension BP Readings from Last 3 Encounters:  10/11/20 (!) 147/69  04/08/20 134/68  03/07/20 (!) 162/69  Checks blood pressure with PT at home ~120s/70, takes medication as prescribed. Watches diet and avoids salt. Denies chest pain or SOB.    2. Type 2 diabetes mellitus without complication, without long-term current use of insulin (HCC) Lab Results  Component Value Date   HGBA1C 6.9 (H) 10/14/2017  Checks CBG 4x day, ~80-150, takes medication as prescribed. Avoids foods high in sugar. HA1C 7.0 on 09/30/2020 (report with pt).    3. Age-related osteoporosis without current pathological fracture Pt is going to see an osteoporosis doctor this Friday due to recent fall. Plans on DEXA scan with this specialist.   4. Mixed hyperlipidemia Lab Results  Component Value Date   CHOL 98 (L) 04/04/2017   HDL 38 (L) 04/04/2017   LDLCALC 49 04/04/2017   TRIG 54 04/04/2017   CHOLHDL 2.6 04/04/2017  Takes medication as prescribed. Avoids fried and fatty foods.    5. Class 2 severe obesity due to excess calories with serious comorbidity and body mass index (BMI) of 38.0 to 38.9 in adult [facility]) Wt Readings from Last 3 Encounters:  10/11/20 208 lb (94.3 kg)  04/08/20 221 lb (100.2 kg)  03/07/20 222 lb (100.7 kg)   BMI Readings from Last 3 Encounters:  10/11/20 34.61 kg/m  04/08/20 36.78 kg/m  03/07/20 36.94 kg/m  Does PT and walks regularly.      Outpatient Encounter Medications as of 10/11/2020  Medication Sig  . acetaminophen (TYLENOL) 500 MG tablet Take 500 mg 2 (two) times daily as needed by mouth for moderate pain or headache.   . alendronate (FOSAMAX) 70 MG tablet Take with a full glass of water on an empty stomach.  [name] amoxicillin (AMOXIL) 500 MG capsule Take 1 capsule (500 mg total) by mouth 2  (two) times daily.  . Ascorbic Acid (VITAMIN C) 1000 MG tablet Take 1,000 mg daily by mouth.  [name] aspirin 81 MG EC tablet Take 1 tablet (81 mg total) by mouth daily.  [name] atorvastatin (LIPITOR) 10 MG tablet Take 1 tablet (10 mg total) by mouth daily.  . beta carotene w/minerals (OCUVITE) tablet Take 1 tablet by mouth daily.  . calcium carbonate (TUMS - DOSED IN MG ELEMENTAL CALCIUM) 500 MG chewable tablet Chew 1 tablet by mouth daily as needed for indigestion or heartburn.  . calcium-vitamin D (OSCAL 500/200 D-3) 500-200 MG-UNIT tablet Take 1 tablet by mouth 2 (two) times daily.  . cholecalciferol (VITAMIN D) 1000 UNITS tablet Take 1,000 Units by mouth daily.  . fluticasone (FLONASE) 50 MCG/ACT nasal spray Place 1 spray into both nostrils daily as needed for allergies or rhinitis.  [name] HUMALOG 100 UNIT/ML injection Inject 6-20 Units into the skin 3 (three) times daily with meals. Using sliding scale.  . hydrochlorothiazide (HYDRODIURIL) 25 MG tablet Take 1 tablet (25 mg total) by mouth daily.  [name] LANTUS 100 UNIT/ML injection Inject 35 Units into the skin at bedtime.   [name] lisinopril (ZESTRIL) 20 MG tablet Take 1 tablet (20 mg total) by mouth daily.  [name] LORazepam (ATIVAN) 0.5 MG tablet Take 1 tablet (0.5 mg total) by mouth 2 (two) times daily as needed for anxiety.  . Magnesium 250 MG TABS Take  250 mg by mouth daily.   . metFORMIN (GLUCOPHAGE-XR) 500 MG 24 hr tablet Take 1 tablet (500 mg total) by mouth 2 (two) times daily.  . metoprolol tartrate (LOPRESSOR) 25 MG tablet TAKE 1/2 TABLET TWICE DAILY  . Multiple Vitamins-Minerals (MULTI-BETIC DIABETES) TABS Take 1 tablet daily by mouth.  . mupirocin ointment (BACTROBAN) 2 % Place 1 application into the nose 2 (two) times daily.  [name] pyridOXINE (VITAMIN B-6) 100 MG tablet Take 100 mg by mouth daily.  [name] TEST test strip Test four times daily.   No facility-administered encounter medications on file as of 10/11/2020.    Past Surgical History:   Procedure Laterality Date  . ABDOMINAL HYSTERECTOMY    . APPENDECTOMY    . FRACTURE SURGERY     shoulder  . TEE WITHOUT CARDIOVERSION N/A 04/18/2016   Procedure: TRANSESOPHAGEAL ECHOCARDIOGRAM (TEE);  Surgeon: Thayer Headings, MD;  Location: [place];  Service: Cardiovascular;  Laterality: N/A;  . TEE WITHOUT CARDIOVERSION N/A 10/16/2017   Procedure: TRANSESOPHAGEAL ECHOCARDIOGRAM (TEE);  Surgeon: Rexene Alberts, MD;  Location: [place];  Service: Open Heart Surgery;  Laterality: N/A;  . WRIST SURGERY      Family History  Problem Relation Age of Onset  . Stroke Father 97  . Diabetes Father   . Retinopathy of prematurity Father   . Heart failure Mother   . Heart attack Maternal Grandmother   . Heart attack Paternal Grandmother   . Diabetes Paternal Aunt   . Diabetes Paternal Uncle     New complaints: No new complaints.   Social history: Lives at home with husband, limited due to recent fall but is becoming more active. Recent RV purchase for vacations.   Controlled substance contract: n/a     Review of Systems  Constitutional: Negative.   HENT: Negative.   Eyes: Negative.   Respiratory: Negative.   Cardiovascular: Negative.   Gastrointestinal: Negative.   Endocrine: Negative.   Genitourinary: Negative.   Musculoskeletal: Negative.   Skin: Negative.   Allergic/Immunologic: Negative.   Neurological: Negative.   Hematological: Negative.   Psychiatric/Behavioral: Negative.   All other systems reviewed and are negative.      Objective:   Physical Exam Vitals and nursing note reviewed.  Constitutional:      Appearance: Normal appearance.  HENT:     Head: Normocephalic and atraumatic.     Right Ear: Tympanic membrane, ear canal and external ear normal.     Left Ear: Tympanic membrane, ear canal and external ear normal.     Nose: Nose normal.     Mouth/Throat:     Mouth: Mucous membranes are moist.     Pharynx: Oropharynx is clear.  Eyes:     Extraocular  Movements: Extraocular movements intact.     Conjunctiva/sclera: Conjunctivae normal.     Pupils: Pupils are equal, round, and reactive to light.  Cardiovascular:     Rate and Rhythm: Normal rate and regular rhythm.     Pulses: Normal pulses.     Heart sounds: Normal heart sounds.  Pulmonary:     Effort: Pulmonary effort is normal.     Breath sounds: Normal breath sounds.  Abdominal:     General: Abdomen is flat. Bowel sounds are normal.     Palpations: Abdomen is soft.  Musculoskeletal:        General: Normal range of motion.     Cervical back: Normal range of motion.  Skin:    General: Skin is warm  and dry.     Capillary Refill: Capillary refill takes less than 2 seconds.  Neurological:     General: No focal deficit present.     Mental Status: She is alert and oriented to person, place, and time. Mental status is at baseline.  Psychiatric:        Mood and Affect: Mood normal.        Behavior: Behavior normal.        Thought Content: Thought content normal.        Judgment: Judgment normal.    BP (!) 147/69   Pulse 64   Temp 98.4 F (36.9 C) (Temporal)   Resp 20   Ht 5\' 5"  (1.651 m)   Wt 208 lb (94.3 kg)   SpO2 97%   BMI 34.61 kg/m       Assessment & Plan:  KHADIJATOU BORAK comes in today with chief complaint of Medical Management of Chronic Issues   Diagnosis and orders addressed:  1. Primary hypertension Check blood pressure regularly and take medication as prescribed. Eat a heart healthy diet and avoid foods high in salt.   2. Type 2 diabetes mellitus without complication, without long-term current use of insulin (HCC) Check blood glucose levels regularly and take medication as prescribed. Avoid foods that are high in empty carbs and sugar.   3. Age-related osteoporosis without current pathological fracture Follow up with specialist on Friday.   4. Mixed hyperlipidemia Take medication as prescribed. Avoid foods that are fried and fatty.   5. Class 2 severe  obesity due to excess calories with serious comorbidity and body mass index (BMI) of 38.0 to 38.9 in adult [facility]) Exercise regularly. Walking and swimming are great cardiovascular exercises that help lower blood pressure, blood glucose levels, and cholesterol levels.   Meds ordered this encounter  Medications  . hydrochlorothiazide (HYDRODIURIL) 25 MG tablet    Sig: Take 1 tablet (25 mg total) by mouth daily.    Dispense:  90 tablet    Refill:  1    Order Specific Question:   Supervising Provider    Answer:   Caryl Pina A [id number]  . lisinopril (ZESTRIL) 20 MG tablet    Sig: Take 1 tablet (20 mg total) by mouth daily.    Dispense:  90 tablet    Refill:  1    Order Specific Question:   Supervising Provider    Answer:   Caryl Pina A [id number]  . atorvastatin (LIPITOR) 10 MG tablet    Sig: Take 1 tablet (10 mg total) by mouth daily.    Dispense:  90 tablet    Refill:  1    Order Specific Question:   Supervising Provider    Answer:   Caryl Pina A [id number]     Labs pending Health Maintenance reviewed Diet and exercise encouraged  Follow up plan: Follow up in 6 months.    Mary-Margaret Hassell Done, FNP

## 2020-10-11 NOTE — Patient Instructions (Addendum)
DASH Eating Plan DASH stands for "Dietary Approaches to Stop Hypertension." The DASH eating plan is a healthy eating plan that has been shown to reduce high blood pressure (hypertension). It may also reduce your risk for type 2 diabetes, heart disease, and stroke. The DASH eating plan may also help with weight loss. What are tips for following this plan?  General guidelines  Avoid eating more than 2,300 mg (milligrams) of salt (sodium) a day. If you have hypertension, you may need to reduce your sodium intake to 1,500 mg a day.  Limit alcohol intake to no more than 1 drink a day for nonpregnant women and 2 drinks a day for men. One drink equals 12 oz of beer, 5 oz of wine, or 1 oz of hard liquor.  Work with your health care provider to maintain a healthy body weight or to lose weight. Ask what an ideal weight is for you.  Get at least 30 minutes of exercise that causes your heart to beat faster (aerobic exercise) most days of the week. Activities may include walking, swimming, or biking.  Work with your health care provider or diet and nutrition specialist (dietitian) to adjust your eating plan to your individual calorie needs. Reading food labels   Check food labels for the amount of sodium per serving. Choose foods with less than 5 percent of the Daily Value of sodium. Generally, foods with less than 300 mg of sodium per serving fit into this eating plan.  To find whole grains, look for the word "whole" as the first word in the ingredient list. Shopping  Buy products labeled as "low-sodium" or "no salt added."  Buy fresh foods. Avoid canned foods and premade or frozen meals. Cooking  Avoid adding salt when cooking. Use salt-free seasonings or herbs instead of table salt or sea salt. Check with your health care provider or pharmacist before using salt substitutes.  Do not fry foods. Cook foods using healthy methods such as baking, boiling, grilling, and broiling instead.  Cook with  heart-healthy oils, such as olive, canola, soybean, or sunflower oil. Meal planning  Eat a balanced diet that includes: ? 5 or more servings of fruits and vegetables each day. At each meal, try to fill half of your plate with fruits and vegetables. ? Up to 6-8 servings of whole grains each day. ? Less than 6 oz of lean meat, poultry, or fish each day. A 3-oz serving of meat is about the same size as a deck of cards. One egg equals 1 oz. ? 2 servings of low-fat dairy each day. ? A serving of nuts, seeds, or beans 5 times each week. ? Heart-healthy fats. Healthy fats called Omega-3 fatty acids are found in foods such as flaxseeds and coldwater fish, like sardines, salmon, and mackerel.  Limit how much you eat of the following: ? Canned or prepackaged foods. ? Food that is high in trans fat, such as fried foods. ? Food that is high in saturated fat, such as fatty meat. ? Sweets, desserts, sugary drinks, and other foods with added sugar. ? Full-fat dairy products.  Do not salt foods before eating.  Try to eat at least 2 vegetarian meals each week.  Eat more home-cooked food and less restaurant, buffet, and fast food.  When eating at a restaurant, ask that your food be prepared with less salt or no salt, if possible. What foods are recommended? The items listed may not be a complete list. Talk with your dietitian about   what dietary choices are best for you. Grains Whole-grain or whole-wheat bread. Whole-grain or whole-wheat pasta. Brown rice. Oatmeal. Quinoa. Bulgur. Whole-grain and low-sodium cereals. Pita bread. Low-fat, low-sodium crackers. Whole-wheat flour tortillas. Vegetables Fresh or frozen vegetables (raw, steamed, roasted, or grilled). Low-sodium or reduced-sodium tomato and vegetable juice. Low-sodium or reduced-sodium tomato sauce and tomato paste. Low-sodium or reduced-sodium canned vegetables. Fruits All fresh, dried, or frozen fruit. Canned fruit in natural juice (without  added sugar). Meat and other protein foods Skinless chicken or turkey. Ground chicken or turkey. Pork with fat trimmed off. Fish and seafood. Egg whites. Dried beans, peas, or lentils. Unsalted nuts, nut butters, and seeds. Unsalted canned beans. Lean cuts of beef with fat trimmed off. Low-sodium, lean deli meat. Dairy Low-fat (1%) or fat-free (skim) milk. Fat-free, low-fat, or reduced-fat cheeses. Nonfat, low-sodium ricotta or cottage cheese. Low-fat or nonfat yogurt. Low-fat, low-sodium cheese. Fats and oils Soft margarine without trans fats. Vegetable oil. Low-fat, reduced-fat, or light mayonnaise and salad dressings (reduced-sodium). Canola, safflower, olive, soybean, and sunflower oils. Avocado. Seasoning and other foods Herbs. Spices. Seasoning mixes without salt. Unsalted popcorn and pretzels. Fat-free sweets. What foods are not recommended? The items listed may not be a complete list. Talk with your dietitian about what dietary choices are best for you. Grains Baked goods made with fat, such as croissants, muffins, or some breads. Dry pasta or rice meal packs. Vegetables Creamed or fried vegetables. Vegetables in a cheese sauce. Regular canned vegetables (not low-sodium or reduced-sodium). Regular canned tomato sauce and paste (not low-sodium or reduced-sodium). Regular tomato and vegetable juice (not low-sodium or reduced-sodium). Pickles. Olives. Fruits Canned fruit in a light or heavy syrup. Fried fruit. Fruit in cream or butter sauce. Meat and other protein foods Fatty cuts of meat. Ribs. Fried meat. Bacon. Sausage. Bologna and other processed lunch meats. Salami. Fatback. Hotdogs. Bratwurst. Salted nuts and seeds. Canned beans with added salt. Canned or smoked fish. Whole eggs or egg yolks. Chicken or turkey with skin. Dairy Whole or 2% milk, cream, and half-and-half. Whole or full-fat cream cheese. Whole-fat or sweetened yogurt. Full-fat cheese. Nondairy creamers. Whipped toppings.  Processed cheese and cheese spreads. Fats and oils Butter. Stick margarine. Lard. Shortening. Ghee. Bacon fat. Tropical oils, such as coconut, palm kernel, or palm oil. Seasoning and other foods Salted popcorn and pretzels. Onion salt, garlic salt, seasoned salt, table salt, and sea salt. Worcestershire sauce. Tartar sauce. Barbecue sauce. Teriyaki sauce. Soy sauce, including reduced-sodium. Steak sauce. Canned and packaged gravies. Fish sauce. Oyster sauce. Cocktail sauce. Horseradish that you find on the shelf. Ketchup. Mustard. Meat flavorings and tenderizers. Bouillon cubes. Hot sauce and Tabasco sauce. Premade or packaged marinades. Premade or packaged taco seasonings. Relishes. Regular salad dressings. Where to find more information:  National Heart, Lung, and Blood Institute: www.nhlbi.nih.gov  American Heart Association: www.heart.org Summary  The DASH eating plan is a healthy eating plan that has been shown to reduce high blood pressure (hypertension). It may also reduce your risk for type 2 diabetes, heart disease, and stroke.  With the DASH eating plan, you should limit salt (sodium) intake to 2,300 mg a day. If you have hypertension, you may need to reduce your sodium intake to 1,500 mg a day.  When on the DASH eating plan, aim to eat more fresh fruits and vegetables, whole grains, lean proteins, low-fat dairy, and heart-healthy fats.  Work with your health care provider or diet and nutrition specialist (dietitian) to adjust your eating plan to your   individual calorie needs. This information is not intended to replace advice given to you by your health care provider. Make sure you discuss any questions you have with your health care provider. Document Revised: 10/18/2017 Document Reviewed: 10/29/2016 Elsevier Patient Education  [place] and Cholesterol Restricted Eating Plan Eating a diet that limits fat and cholesterol may help lower your risk for heart disease  and other conditions. Your body needs fat and cholesterol for basic functions, but eating too much of these things can be harmful to your health. Your health care provider may order lab tests to check your blood fat (lipid) and cholesterol levels. This helps your health care provider understand your risk for certain conditions and whether you need to make diet changes. Work with your health care provider or dietitian to make an eating plan that is right for you. Your plan includes:  Limit your fat intake to ______% or less of your total calories a day.  Limit your saturated fat intake to ______% or less of your total calories a day.  Limit the amount of cholesterol in your diet to less than _________mg a day.  Eat ___________ g of fiber a day. What are tips for following this plan? General guidelines   If you are overweight, work with your health care provider to lose weight safely. Losing just 5-10% of your body weight can improve your overall health and help prevent diseases such as diabetes and heart disease.  Avoid: ? Foods with added sugar. ? Fried foods. ? Foods that contain partially hydrogenated oils, including stick margarine, some tub margarines, cookies, crackers, and other baked goods.  Limit alcohol intake to no more than 1 drink a day for nonpregnant women and 2 drinks a day for men. One drink equals 12 oz of beer, 5 oz of wine, or 1 oz of hard liquor. Reading food labels  Check food labels for: ? Trans fats, partially hydrogenated oils, or high amounts of saturated fat. Avoid foods that contain saturated fat and trans fat. ? The amount of cholesterol in each serving. Try to eat no more than 200 mg of cholesterol each day. ? The amount of fiber in each serving. Try to eat at least 20-30 g of fiber each day.  Choose foods with healthy fats, such as: ? Monounsaturated and polyunsaturated fats. These include olive and canola oil, flaxseeds, walnuts, almonds, and  seeds. ? Omega-3 fats. These are found in foods such as salmon, mackerel, sardines, tuna, flaxseed oil, and ground flaxseeds.  Choose grain products that have whole grains. Look for the word "whole" as the first word in the ingredient list. Cooking  Cook foods using methods other than frying. Baking, boiling, grilling, and broiling are some healthy options.  Eat more home-cooked food and less restaurant, buffet, and fast food.  Avoid cooking using saturated fats. ? Animal sources of saturated fats include meats, butter, and cream. ? Plant sources of saturated fats include palm oil, palm kernel oil, and coconut oil. Meal planning   At meals, imagine dividing your plate into fourths: ? Fill one-half of your plate with vegetables and green salads. ? Fill one-fourth of your plate with whole grains. ? Fill one-fourth of your plate with lean protein foods.  Eat fish that is high in omega-3 fats at least two times a week.  Eat more foods that contain fiber, such as whole grains, beans, apples, broccoli, carrots, peas, and barley. These foods help promote healthy cholesterol levels in the  blood. Recommended foods Grains  Whole grains, such as whole wheat or whole grain breads, crackers, cereals, and pasta. Unsweetened oatmeal, bulgur, barley, quinoa, or brown rice. Corn or whole wheat flour tortillas. Vegetables  Fresh or frozen vegetables (raw, steamed, roasted, or grilled). Green salads. Fruits  All fresh, canned (in natural juice), or frozen fruits. Meats and other protein foods  Ground beef (85% or leaner), grass-fed beef, or beef trimmed of fat. Skinless chicken or [place]. Ground chicken or [place]. Pork trimmed of fat. All fish and seafood. Egg whites. Dried beans, peas, or lentils. Unsalted nuts or seeds. Unsalted canned beans. Natural nut butters without added sugar and oil. Dairy  Low-fat or nonfat dairy products, such as skim or 1% milk, 2% or reduced-fat cheeses, low-fat and  fat-free ricotta or cottage cheese, or plain low-fat and nonfat yogurt. Fats and oils  Tub margarine without trans fats. Light or reduced-fat mayonnaise and salad dressings. Avocado. Olive, canola, sesame, or safflower oils. The items listed above may not be a complete list of recommended foods or beverages. Contact your dietitian for more options. Foods to avoid Grains  White bread. White pasta. White rice. Cornbread. Bagels, pastries, and croissants. Crackers and snack foods that contain trans fat and hydrogenated oils. Vegetables  Vegetables cooked in cheese, cream, or butter sauce. Fried vegetables. Fruits  Canned fruit in heavy syrup. Fruit in cream or butter sauce. Fried fruit. Meats and other protein foods  Fatty cuts of meat. Ribs, chicken wings, bacon, sausage, bologna, salami, chitterlings, fatback, hot dogs, bratwurst, and packaged lunch meats. Liver and organ meats. Whole eggs and egg yolks. Chicken and [place] with skin. Fried meat. Dairy  Whole or 2% milk, cream, half-and-half, and cream cheese. Whole milk cheeses. Whole-fat or sweetened yogurt. Full-fat cheeses. Nondairy creamers and whipped toppings. Processed cheese, cheese spreads, and cheese curds. Beverages  Alcohol. Sugar-sweetened drinks such as sodas, lemonade, and fruit drinks. Fats and oils  Butter, stick margarine, lard, shortening, ghee, or bacon fat. Coconut, palm kernel, and palm oils. Sweets and desserts  Corn syrup, sugars, honey, and molasses. Candy. Jam and jelly. Syrup. Sweetened cereals. Cookies, pies, cakes, donuts, muffins, and ice cream. The items listed above may not be a complete list of foods and beverages to avoid. Contact your dietitian for more information. Summary  Your body needs fat and cholesterol for basic functions. However, eating too much of these things can be harmful to your health.  Work with your health care provider and dietitian to follow a diet low in fat and cholesterol.  Doing this may help lower your risk for heart disease and other conditions.  Choose healthy fats, such as monounsaturated and polyunsaturated fats, and foods high in omega-3 fatty acids.  Eat fiber-rich foods, such as whole grains, beans, peas, fruits, and vegetables.  Limit or avoid alcohol, fried foods, and foods high in saturated fats, partially hydrogenated oils, and sugar. This information is not intended to replace advice given to you by your health care provider. Make sure you discuss any questions you have with your health care provider. Document Revised: 10/18/2017 Document Reviewed: 07/23/2017 Elsevier Patient Education  [place].

## 2020-10-12 DIAGNOSIS — Z7984 Long term (current) use of oral hypoglycemic drugs: Secondary | ICD-10-CM | POA: Diagnosis not present

## 2020-10-12 DIAGNOSIS — Z7982 Long term (current) use of aspirin: Secondary | ICD-10-CM | POA: Diagnosis not present

## 2020-10-12 DIAGNOSIS — I10 Essential (primary) hypertension: Secondary | ICD-10-CM | POA: Diagnosis not present

## 2020-10-12 DIAGNOSIS — M80051D Age-related osteoporosis with current pathological fracture, right femur, subsequent encounter for fracture with routine healing: Secondary | ICD-10-CM | POA: Diagnosis not present

## 2020-10-12 DIAGNOSIS — E119 Type 2 diabetes mellitus without complications: Secondary | ICD-10-CM | POA: Diagnosis not present

## 2020-10-14 DIAGNOSIS — M1712 Unilateral primary osteoarthritis, left knee: Secondary | ICD-10-CM | POA: Diagnosis not present

## 2020-10-14 DIAGNOSIS — M85822 Other specified disorders of bone density and structure, left upper arm: Secondary | ICD-10-CM | POA: Diagnosis not present

## 2020-10-14 DIAGNOSIS — M80851D Other osteoporosis with current pathological fracture, right femur, subsequent encounter for fracture with routine healing: Secondary | ICD-10-CM | POA: Diagnosis not present

## 2020-10-14 DIAGNOSIS — M1612 Unilateral primary osteoarthritis, left hip: Secondary | ICD-10-CM | POA: Diagnosis not present

## 2020-10-14 DIAGNOSIS — Z9181 History of falling: Secondary | ICD-10-CM | POA: Diagnosis not present

## 2020-10-14 DIAGNOSIS — Z794 Long term (current) use of insulin: Secondary | ICD-10-CM | POA: Diagnosis not present

## 2020-10-14 DIAGNOSIS — M80051D Age-related osteoporosis with current pathological fracture, right femur, subsequent encounter for fracture with routine healing: Secondary | ICD-10-CM | POA: Diagnosis not present

## 2020-10-14 DIAGNOSIS — Z7983 Long term (current) use of bisphosphonates: Secondary | ICD-10-CM | POA: Diagnosis not present

## 2020-10-14 DIAGNOSIS — E119 Type 2 diabetes mellitus without complications: Secondary | ICD-10-CM | POA: Diagnosis not present

## 2020-10-14 DIAGNOSIS — T50995D Adverse effect of other drugs, medicaments and biological substances, subsequent encounter: Secondary | ICD-10-CM | POA: Diagnosis not present

## 2020-10-14 DIAGNOSIS — T50905S Adverse effect of unspecified drugs, medicaments and biological substances, sequela: Secondary | ICD-10-CM | POA: Diagnosis not present

## 2020-10-14 DIAGNOSIS — T458X5A Adverse effect of other primarily systemic and hematological agents, initial encounter: Secondary | ICD-10-CM | POA: Insufficient documentation

## 2020-10-14 DIAGNOSIS — R937 Abnormal findings on diagnostic imaging of other parts of musculoskeletal system: Secondary | ICD-10-CM | POA: Diagnosis not present

## 2020-10-17 DIAGNOSIS — Z794 Long term (current) use of insulin: Secondary | ICD-10-CM | POA: Diagnosis not present

## 2020-10-17 DIAGNOSIS — M810 Age-related osteoporosis without current pathological fracture: Secondary | ICD-10-CM | POA: Diagnosis not present

## 2020-10-17 DIAGNOSIS — E785 Hyperlipidemia, unspecified: Secondary | ICD-10-CM | POA: Diagnosis not present

## 2020-10-17 DIAGNOSIS — M84352A Stress fracture, left femur, initial encounter for fracture: Secondary | ICD-10-CM | POA: Diagnosis not present

## 2020-10-17 DIAGNOSIS — Z953 Presence of xenogenic heart valve: Secondary | ICD-10-CM | POA: Diagnosis not present

## 2020-10-17 DIAGNOSIS — E669 Obesity, unspecified: Secondary | ICD-10-CM | POA: Diagnosis not present

## 2020-10-17 DIAGNOSIS — E119 Type 2 diabetes mellitus without complications: Secondary | ICD-10-CM | POA: Diagnosis not present

## 2020-10-17 DIAGNOSIS — I10 Essential (primary) hypertension: Secondary | ICD-10-CM | POA: Diagnosis not present

## 2020-10-17 DIAGNOSIS — Z882 Allergy status to sulfonamides status: Secondary | ICD-10-CM | POA: Diagnosis not present

## 2020-10-17 DIAGNOSIS — G8918 Other acute postprocedural pain: Secondary | ICD-10-CM | POA: Diagnosis not present

## 2020-10-17 DIAGNOSIS — Z6835 Body mass index (BMI) 35.0-35.9, adult: Secondary | ICD-10-CM | POA: Diagnosis not present

## 2020-10-25 DIAGNOSIS — E119 Type 2 diabetes mellitus without complications: Secondary | ICD-10-CM | POA: Diagnosis not present

## 2020-10-25 DIAGNOSIS — Z7984 Long term (current) use of oral hypoglycemic drugs: Secondary | ICD-10-CM | POA: Diagnosis not present

## 2020-10-25 DIAGNOSIS — M80051D Age-related osteoporosis with current pathological fracture, right femur, subsequent encounter for fracture with routine healing: Secondary | ICD-10-CM | POA: Diagnosis not present

## 2020-10-25 DIAGNOSIS — I10 Essential (primary) hypertension: Secondary | ICD-10-CM | POA: Diagnosis not present

## 2020-10-25 DIAGNOSIS — Z7982 Long term (current) use of aspirin: Secondary | ICD-10-CM | POA: Diagnosis not present

## 2020-10-26 ENCOUNTER — Ambulatory Visit: Payer: Medicare PPO | Admitting: Dermatology

## 2020-10-26 DIAGNOSIS — M84352D Stress fracture, left femur, subsequent encounter for fracture with routine healing: Secondary | ICD-10-CM | POA: Diagnosis not present

## 2020-10-27 DIAGNOSIS — Z7984 Long term (current) use of oral hypoglycemic drugs: Secondary | ICD-10-CM | POA: Diagnosis not present

## 2020-10-27 DIAGNOSIS — M80051D Age-related osteoporosis with current pathological fracture, right femur, subsequent encounter for fracture with routine healing: Secondary | ICD-10-CM | POA: Diagnosis not present

## 2020-10-27 DIAGNOSIS — I10 Essential (primary) hypertension: Secondary | ICD-10-CM | POA: Diagnosis not present

## 2020-10-27 DIAGNOSIS — E119 Type 2 diabetes mellitus without complications: Secondary | ICD-10-CM | POA: Diagnosis not present

## 2020-10-27 DIAGNOSIS — Z7982 Long term (current) use of aspirin: Secondary | ICD-10-CM | POA: Diagnosis not present

## 2020-10-31 DIAGNOSIS — I10 Essential (primary) hypertension: Secondary | ICD-10-CM | POA: Diagnosis not present

## 2020-10-31 DIAGNOSIS — Z7984 Long term (current) use of oral hypoglycemic drugs: Secondary | ICD-10-CM | POA: Diagnosis not present

## 2020-10-31 DIAGNOSIS — Z7982 Long term (current) use of aspirin: Secondary | ICD-10-CM | POA: Diagnosis not present

## 2020-10-31 DIAGNOSIS — M80051D Age-related osteoporosis with current pathological fracture, right femur, subsequent encounter for fracture with routine healing: Secondary | ICD-10-CM | POA: Diagnosis not present

## 2020-10-31 DIAGNOSIS — E119 Type 2 diabetes mellitus without complications: Secondary | ICD-10-CM | POA: Diagnosis not present

## 2020-11-01 DIAGNOSIS — S7291XA Unspecified fracture of right femur, initial encounter for closed fracture: Secondary | ICD-10-CM | POA: Diagnosis not present

## 2020-11-02 DIAGNOSIS — Z7982 Long term (current) use of aspirin: Secondary | ICD-10-CM | POA: Diagnosis not present

## 2020-11-02 DIAGNOSIS — I10 Essential (primary) hypertension: Secondary | ICD-10-CM | POA: Diagnosis not present

## 2020-11-02 DIAGNOSIS — M80051D Age-related osteoporosis with current pathological fracture, right femur, subsequent encounter for fracture with routine healing: Secondary | ICD-10-CM | POA: Diagnosis not present

## 2020-11-02 DIAGNOSIS — Z7984 Long term (current) use of oral hypoglycemic drugs: Secondary | ICD-10-CM | POA: Diagnosis not present

## 2020-11-02 DIAGNOSIS — E119 Type 2 diabetes mellitus without complications: Secondary | ICD-10-CM | POA: Diagnosis not present

## 2020-11-03 DIAGNOSIS — Z7982 Long term (current) use of aspirin: Secondary | ICD-10-CM | POA: Diagnosis not present

## 2020-11-03 DIAGNOSIS — E669 Obesity, unspecified: Secondary | ICD-10-CM | POA: Diagnosis not present

## 2020-11-03 DIAGNOSIS — H409 Unspecified glaucoma: Secondary | ICD-10-CM | POA: Diagnosis not present

## 2020-11-03 DIAGNOSIS — Z7984 Long term (current) use of oral hypoglycemic drugs: Secondary | ICD-10-CM | POA: Diagnosis not present

## 2020-11-03 DIAGNOSIS — E119 Type 2 diabetes mellitus without complications: Secondary | ICD-10-CM | POA: Diagnosis not present

## 2020-11-03 DIAGNOSIS — M80051D Age-related osteoporosis with current pathological fracture, right femur, subsequent encounter for fracture with routine healing: Secondary | ICD-10-CM | POA: Diagnosis not present

## 2020-11-03 DIAGNOSIS — Z9181 History of falling: Secondary | ICD-10-CM | POA: Diagnosis not present

## 2020-11-03 DIAGNOSIS — E785 Hyperlipidemia, unspecified: Secondary | ICD-10-CM | POA: Diagnosis not present

## 2020-11-03 DIAGNOSIS — I10 Essential (primary) hypertension: Secondary | ICD-10-CM | POA: Diagnosis not present

## 2020-11-08 DIAGNOSIS — Z9181 History of falling: Secondary | ICD-10-CM | POA: Diagnosis not present

## 2020-11-08 DIAGNOSIS — Z7982 Long term (current) use of aspirin: Secondary | ICD-10-CM | POA: Diagnosis not present

## 2020-11-08 DIAGNOSIS — Z7984 Long term (current) use of oral hypoglycemic drugs: Secondary | ICD-10-CM | POA: Diagnosis not present

## 2020-11-08 DIAGNOSIS — E669 Obesity, unspecified: Secondary | ICD-10-CM | POA: Diagnosis not present

## 2020-11-08 DIAGNOSIS — E119 Type 2 diabetes mellitus without complications: Secondary | ICD-10-CM | POA: Diagnosis not present

## 2020-11-08 DIAGNOSIS — H409 Unspecified glaucoma: Secondary | ICD-10-CM | POA: Diagnosis not present

## 2020-11-08 DIAGNOSIS — E785 Hyperlipidemia, unspecified: Secondary | ICD-10-CM | POA: Diagnosis not present

## 2020-11-08 DIAGNOSIS — M80051D Age-related osteoporosis with current pathological fracture, right femur, subsequent encounter for fracture with routine healing: Secondary | ICD-10-CM | POA: Diagnosis not present

## 2020-11-08 DIAGNOSIS — I10 Essential (primary) hypertension: Secondary | ICD-10-CM | POA: Diagnosis not present

## 2020-11-10 DIAGNOSIS — Z7982 Long term (current) use of aspirin: Secondary | ICD-10-CM | POA: Diagnosis not present

## 2020-11-10 DIAGNOSIS — I10 Essential (primary) hypertension: Secondary | ICD-10-CM | POA: Diagnosis not present

## 2020-11-10 DIAGNOSIS — E669 Obesity, unspecified: Secondary | ICD-10-CM | POA: Diagnosis not present

## 2020-11-10 DIAGNOSIS — E785 Hyperlipidemia, unspecified: Secondary | ICD-10-CM | POA: Diagnosis not present

## 2020-11-10 DIAGNOSIS — H409 Unspecified glaucoma: Secondary | ICD-10-CM | POA: Diagnosis not present

## 2020-11-10 DIAGNOSIS — Z9181 History of falling: Secondary | ICD-10-CM | POA: Diagnosis not present

## 2020-11-10 DIAGNOSIS — E119 Type 2 diabetes mellitus without complications: Secondary | ICD-10-CM | POA: Diagnosis not present

## 2020-11-10 DIAGNOSIS — M80051D Age-related osteoporosis with current pathological fracture, right femur, subsequent encounter for fracture with routine healing: Secondary | ICD-10-CM | POA: Diagnosis not present

## 2020-11-10 DIAGNOSIS — Z7984 Long term (current) use of oral hypoglycemic drugs: Secondary | ICD-10-CM | POA: Diagnosis not present

## 2020-11-15 DIAGNOSIS — M80051D Age-related osteoporosis with current pathological fracture, right femur, subsequent encounter for fracture with routine healing: Secondary | ICD-10-CM | POA: Diagnosis not present

## 2020-11-15 DIAGNOSIS — I10 Essential (primary) hypertension: Secondary | ICD-10-CM | POA: Diagnosis not present

## 2020-11-15 DIAGNOSIS — H409 Unspecified glaucoma: Secondary | ICD-10-CM | POA: Diagnosis not present

## 2020-11-15 DIAGNOSIS — Z7984 Long term (current) use of oral hypoglycemic drugs: Secondary | ICD-10-CM | POA: Diagnosis not present

## 2020-11-15 DIAGNOSIS — Z7982 Long term (current) use of aspirin: Secondary | ICD-10-CM | POA: Diagnosis not present

## 2020-11-15 DIAGNOSIS — E785 Hyperlipidemia, unspecified: Secondary | ICD-10-CM | POA: Diagnosis not present

## 2020-11-15 DIAGNOSIS — E119 Type 2 diabetes mellitus without complications: Secondary | ICD-10-CM | POA: Diagnosis not present

## 2020-11-15 DIAGNOSIS — E669 Obesity, unspecified: Secondary | ICD-10-CM | POA: Diagnosis not present

## 2020-11-15 DIAGNOSIS — Z9181 History of falling: Secondary | ICD-10-CM | POA: Diagnosis not present

## 2020-11-17 DIAGNOSIS — Z7984 Long term (current) use of oral hypoglycemic drugs: Secondary | ICD-10-CM | POA: Diagnosis not present

## 2020-11-17 DIAGNOSIS — E119 Type 2 diabetes mellitus without complications: Secondary | ICD-10-CM | POA: Diagnosis not present

## 2020-11-17 DIAGNOSIS — E785 Hyperlipidemia, unspecified: Secondary | ICD-10-CM | POA: Diagnosis not present

## 2020-11-17 DIAGNOSIS — E669 Obesity, unspecified: Secondary | ICD-10-CM | POA: Diagnosis not present

## 2020-11-17 DIAGNOSIS — Z9181 History of falling: Secondary | ICD-10-CM | POA: Diagnosis not present

## 2020-11-17 DIAGNOSIS — M80051D Age-related osteoporosis with current pathological fracture, right femur, subsequent encounter for fracture with routine healing: Secondary | ICD-10-CM | POA: Diagnosis not present

## 2020-11-17 DIAGNOSIS — I10 Essential (primary) hypertension: Secondary | ICD-10-CM | POA: Diagnosis not present

## 2020-11-17 DIAGNOSIS — Z7982 Long term (current) use of aspirin: Secondary | ICD-10-CM | POA: Diagnosis not present

## 2020-11-17 DIAGNOSIS — H409 Unspecified glaucoma: Secondary | ICD-10-CM | POA: Diagnosis not present

## 2020-11-23 DIAGNOSIS — I10 Essential (primary) hypertension: Secondary | ICD-10-CM | POA: Diagnosis not present

## 2020-11-23 DIAGNOSIS — Z9181 History of falling: Secondary | ICD-10-CM | POA: Diagnosis not present

## 2020-11-23 DIAGNOSIS — E119 Type 2 diabetes mellitus without complications: Secondary | ICD-10-CM | POA: Diagnosis not present

## 2020-11-23 DIAGNOSIS — E669 Obesity, unspecified: Secondary | ICD-10-CM | POA: Diagnosis not present

## 2020-11-23 DIAGNOSIS — E785 Hyperlipidemia, unspecified: Secondary | ICD-10-CM | POA: Diagnosis not present

## 2020-11-23 DIAGNOSIS — H409 Unspecified glaucoma: Secondary | ICD-10-CM | POA: Diagnosis not present

## 2020-11-23 DIAGNOSIS — M80051D Age-related osteoporosis with current pathological fracture, right femur, subsequent encounter for fracture with routine healing: Secondary | ICD-10-CM | POA: Diagnosis not present

## 2020-11-23 DIAGNOSIS — Z7984 Long term (current) use of oral hypoglycemic drugs: Secondary | ICD-10-CM | POA: Diagnosis not present

## 2020-11-23 DIAGNOSIS — Z7982 Long term (current) use of aspirin: Secondary | ICD-10-CM | POA: Diagnosis not present

## 2020-11-25 DIAGNOSIS — I10 Essential (primary) hypertension: Secondary | ICD-10-CM | POA: Diagnosis not present

## 2020-11-25 DIAGNOSIS — Z9181 History of falling: Secondary | ICD-10-CM | POA: Diagnosis not present

## 2020-11-25 DIAGNOSIS — E785 Hyperlipidemia, unspecified: Secondary | ICD-10-CM | POA: Diagnosis not present

## 2020-11-25 DIAGNOSIS — E119 Type 2 diabetes mellitus without complications: Secondary | ICD-10-CM | POA: Diagnosis not present

## 2020-11-25 DIAGNOSIS — E669 Obesity, unspecified: Secondary | ICD-10-CM | POA: Diagnosis not present

## 2020-11-25 DIAGNOSIS — H409 Unspecified glaucoma: Secondary | ICD-10-CM | POA: Diagnosis not present

## 2020-11-25 DIAGNOSIS — M80051D Age-related osteoporosis with current pathological fracture, right femur, subsequent encounter for fracture with routine healing: Secondary | ICD-10-CM | POA: Diagnosis not present

## 2020-11-25 DIAGNOSIS — Z7984 Long term (current) use of oral hypoglycemic drugs: Secondary | ICD-10-CM | POA: Diagnosis not present

## 2020-11-25 DIAGNOSIS — Z7982 Long term (current) use of aspirin: Secondary | ICD-10-CM | POA: Diagnosis not present

## 2020-11-28 DIAGNOSIS — I10 Essential (primary) hypertension: Secondary | ICD-10-CM | POA: Diagnosis not present

## 2020-11-28 DIAGNOSIS — E669 Obesity, unspecified: Secondary | ICD-10-CM | POA: Diagnosis not present

## 2020-11-28 DIAGNOSIS — M80051D Age-related osteoporosis with current pathological fracture, right femur, subsequent encounter for fracture with routine healing: Secondary | ICD-10-CM | POA: Diagnosis not present

## 2020-11-28 DIAGNOSIS — Z7982 Long term (current) use of aspirin: Secondary | ICD-10-CM | POA: Diagnosis not present

## 2020-11-28 DIAGNOSIS — E119 Type 2 diabetes mellitus without complications: Secondary | ICD-10-CM | POA: Diagnosis not present

## 2020-11-28 DIAGNOSIS — E785 Hyperlipidemia, unspecified: Secondary | ICD-10-CM | POA: Diagnosis not present

## 2020-11-28 DIAGNOSIS — Z9181 History of falling: Secondary | ICD-10-CM | POA: Diagnosis not present

## 2020-11-28 DIAGNOSIS — H409 Unspecified glaucoma: Secondary | ICD-10-CM | POA: Diagnosis not present

## 2020-11-28 DIAGNOSIS — Z7984 Long term (current) use of oral hypoglycemic drugs: Secondary | ICD-10-CM | POA: Diagnosis not present

## 2020-12-01 DIAGNOSIS — I10 Essential (primary) hypertension: Secondary | ICD-10-CM | POA: Diagnosis not present

## 2020-12-01 DIAGNOSIS — E785 Hyperlipidemia, unspecified: Secondary | ICD-10-CM | POA: Diagnosis not present

## 2020-12-01 DIAGNOSIS — M80051D Age-related osteoporosis with current pathological fracture, right femur, subsequent encounter for fracture with routine healing: Secondary | ICD-10-CM | POA: Diagnosis not present

## 2020-12-01 DIAGNOSIS — E119 Type 2 diabetes mellitus without complications: Secondary | ICD-10-CM | POA: Diagnosis not present

## 2020-12-01 DIAGNOSIS — Z7982 Long term (current) use of aspirin: Secondary | ICD-10-CM | POA: Diagnosis not present

## 2020-12-01 DIAGNOSIS — Z9181 History of falling: Secondary | ICD-10-CM | POA: Diagnosis not present

## 2020-12-01 DIAGNOSIS — Z7984 Long term (current) use of oral hypoglycemic drugs: Secondary | ICD-10-CM | POA: Diagnosis not present

## 2020-12-01 DIAGNOSIS — H409 Unspecified glaucoma: Secondary | ICD-10-CM | POA: Diagnosis not present

## 2020-12-01 DIAGNOSIS — E669 Obesity, unspecified: Secondary | ICD-10-CM | POA: Diagnosis not present

## 2020-12-02 DIAGNOSIS — S7291XA Unspecified fracture of right femur, initial encounter for closed fracture: Secondary | ICD-10-CM | POA: Diagnosis not present

## 2020-12-03 DIAGNOSIS — Z7984 Long term (current) use of oral hypoglycemic drugs: Secondary | ICD-10-CM | POA: Diagnosis not present

## 2020-12-03 DIAGNOSIS — Z9181 History of falling: Secondary | ICD-10-CM | POA: Diagnosis not present

## 2020-12-03 DIAGNOSIS — Z7982 Long term (current) use of aspirin: Secondary | ICD-10-CM | POA: Diagnosis not present

## 2020-12-03 DIAGNOSIS — I10 Essential (primary) hypertension: Secondary | ICD-10-CM | POA: Diagnosis not present

## 2020-12-03 DIAGNOSIS — E785 Hyperlipidemia, unspecified: Secondary | ICD-10-CM | POA: Diagnosis not present

## 2020-12-03 DIAGNOSIS — E119 Type 2 diabetes mellitus without complications: Secondary | ICD-10-CM | POA: Diagnosis not present

## 2020-12-03 DIAGNOSIS — E669 Obesity, unspecified: Secondary | ICD-10-CM | POA: Diagnosis not present

## 2020-12-03 DIAGNOSIS — H409 Unspecified glaucoma: Secondary | ICD-10-CM | POA: Diagnosis not present

## 2020-12-03 DIAGNOSIS — M80051D Age-related osteoporosis with current pathological fracture, right femur, subsequent encounter for fracture with routine healing: Secondary | ICD-10-CM | POA: Diagnosis not present

## 2020-12-06 DIAGNOSIS — E663 Overweight: Secondary | ICD-10-CM | POA: Insufficient documentation

## 2020-12-06 NOTE — Progress Notes (Unsigned)
Cardiology Office Note   Date:  12/07/2020   ID:  Toni Cooper, DOB 11/16/1946, MRN [id number]  PCP:  [name], FNP  Cardiologist:   No primary care provider on file.   No chief complaint on file.     History of Present Illness: Toni Cooper is a 75 y.o. female who presents for follow up after SAVR.   Since I last saw her she fell and fractured both her femurs.  She had to have rods placed at [facility].  He had no cardiac complications with this.  She denies any cardiovascular symptoms. The patient denies any new symptoms such as chest discomfort, neck or arm discomfort. There has been no new shortness of breath, PND or orthopnea. There have been no reported palpitations, presyncope or syncope.  She was working in her flower garden when this happened and she did not lose consciousness.   Past Medical History:  Diagnosis Date  . Aortic stenosis, severe 2015  . Cataract   . Diabetes mellitus without complication ([place])   . Dyspnea   . Hypertension   . Obesity   . Osteoporosis   . S/P minimally invasive aortic valve replacement with bioprosthetic valve 10/16/2017   23 mm Edwards Intuity Elite rapid deployment bovine stented bioprosthetic tissue valve via partial upper sternotomy  . Squamous cell carcinoma of skin 03/30/2020   KA on left thigh, anterior(CX35FU)    Past Surgical History:  Procedure Laterality Date  . ABDOMINAL HYSTERECTOMY    . APPENDECTOMY    . FEMUR FRACTURE SURGERY  07/30/2020  . FRACTURE SURGERY     shoulder  . TEE WITHOUT CARDIOVERSION N/A 04/18/2016   Procedure: TRANSESOPHAGEAL ECHOCARDIOGRAM (TEE);  Surgeon: Thayer Headings, MD;  Location: [place];  Service: Cardiovascular;  Laterality: N/A;  . TEE WITHOUT CARDIOVERSION N/A 10/16/2017   Procedure: TRANSESOPHAGEAL ECHOCARDIOGRAM (TEE);  Surgeon: Rexene Alberts, MD;  Location: [place];  Service: Open Heart Surgery;  Laterality: N/A;  . WRIST SURGERY       Current Outpatient Medications   Medication Sig Dispense Refill  . acetaminophen (TYLENOL) 500 MG tablet Take 500 mg 2 (two) times daily as needed by mouth for moderate pain or headache.     . Ascorbic Acid (VITAMIN C) 1000 MG tablet Take 1,000 mg daily by mouth.    [name] aspirin 81 MG EC tablet Take 1 tablet (81 mg total) by mouth daily.    [name] atorvastatin (LIPITOR) 10 MG tablet Take 1 tablet (10 mg total) by mouth daily. 90 tablet 1  . beta carotene w/minerals (OCUVITE) tablet Take 1 tablet by mouth daily.    . calcium carbonate (TUMS - DOSED IN MG ELEMENTAL CALCIUM) 500 MG chewable tablet Chew 1 tablet by mouth daily as needed for indigestion or heartburn.    . calcium-vitamin D (OSCAL 500/200 D-3) 500-200 MG-UNIT tablet Take 1 tablet by mouth 2 (two) times daily.    . cholecalciferol (VITAMIN D) 1000 UNITS tablet Take 1,000 Units by mouth daily.    . fluticasone (FLONASE) 50 MCG/ACT nasal spray Place 1 spray into both nostrils daily as needed for allergies or rhinitis.    [name] HUMALOG 100 UNIT/ML injection Inject 6-20 Units into the skin 3 (three) times daily with meals. Using sliding scale.    . hydrochlorothiazide (HYDRODIURIL) 25 MG tablet Take 1 tablet (25 mg total) by mouth daily. 90 tablet 1  . LANTUS 100 UNIT/ML injection Inject 35 Units into the skin at bedtime.     [name]  lisinopril (ZESTRIL) 20 MG tablet Take 1 tablet (20 mg total) by mouth daily. 90 tablet 1  . LORazepam (ATIVAN) 0.5 MG tablet Take 1 tablet (0.5 mg total) by mouth 2 (two) times daily as needed for anxiety. 60 tablet 2  . Magnesium 250 MG TABS Take 250 mg by mouth daily.     . metFORMIN (GLUCOPHAGE-XR) 500 MG 24 hr tablet Take 1 tablet (500 mg total) by mouth 2 (two) times daily. 180 tablet 1  . Multiple Vitamins-Minerals (MULTI-BETIC DIABETES) TABS Take 1 tablet daily by mouth.    . pyridOXINE (VITAMIN B-6) 100 MG tablet Take 100 mg by mouth daily.    [name] TEST test strip Test four times daily.    . metoprolol tartrate (LOPRESSOR) 25 MG tablet Take  0.5 tablets (12.5 mg total) by mouth 2 (two) times daily. 90 tablet 3   No current facility-administered medications for this visit.    Allergies:   Sulfa antibiotics    ROS:  Please see the history of present illness.   Otherwise, review of systems are positive for none.   All other systems are reviewed and negative.    PHYSICAL EXAM: VS:  BP 140/62   Pulse 75   Ht 5\' 5"  (1.651 m)   Wt 206 lb 12.8 oz (93.8 kg)   SpO2 95%   BMI 34.41 kg/m  , BMI Body mass index is 34.41 kg/m. GENERAL:  Well appearing NECK:  No jugular venous distention, waveform within normal limits, carotid upstroke brisk and symmetric, no bruits, no thyromegaly LUNGS:  Clear to auscultation bilaterally CHEST:  Well healed chest scar HEART:  PMI not displaced or sustained,S1 and S2 within normal limits, no S3, no S4, no clicks, no rubs, 2 out of 6 systolic murmur radiating at the aortic outflow tract, no diastolic murmurs ABD:  Flat, positive bowel sounds normal in frequency in pitch, no bruits, no rebound, no guarding, no midline pulsatile mass, no hepatomegaly, no splenomegaly EXT:  2 plus pulses throughout, trace bilateral leg edema, no cyanosis no clubbing   EKG:  EKG is ordered today. The ekg ordered today demonstrates sinus rhythm, 75, borderline interventricular conduction delay   Recent Labs: No results found for requested labs within last 8760 hours.    Lipid Panel    Component Value Date/Time   CHOL 98 (L) 04/04/2017 0830   TRIG 54 04/04/2017 0830   TRIG 74 02/25/2014 0000   HDL 38 (L) 04/04/2017 0830   CHOLHDL 2.6 04/04/2017 0830   LDLCALC 49 04/04/2017 0830      Wt Readings from Last 3 Encounters:  12/07/20 206 lb 12.8 oz (93.8 kg)  10/11/20 208 lb (94.3 kg)  04/08/20 221 lb (100.2 kg)      Other studies Reviewed: Additional studies/ records that were reviewed today include: Labs. Review of the above records demonstrates:  Please see elsewhere in the note.     ASSESSMENT AND  PLAN:  SAVR:  Patient is doing well with respect to this.  No further imaging is indicated.  OVERWEIGHT: She has lost a little weight kyphotic this in the encouraged more of the same.  DM: A1C was 7.0.  This is followed by [name], FNP   HTN:   Blood pressure is at target.  No change in therapy.  COVID EDUCATION: She has been vaccinated including boosters.  Current medicines are reviewed at length with the patient today.  The patient does not have concerns regarding medicines.  The following changes  have been made: None  Labs/ tests ordered today include: None  Orders Placed This Encounter  Procedures  . EKG 12-Lead     Disposition:   FU with me 1 year   Signed, [name], MD  12/07/2020 12:59 PM    [place]

## 2020-12-07 ENCOUNTER — Other Ambulatory Visit: Payer: Self-pay

## 2020-12-07 ENCOUNTER — Encounter: Payer: Self-pay | Admitting: Cardiology

## 2020-12-07 ENCOUNTER — Ambulatory Visit: Payer: Medicare PPO | Admitting: Cardiology

## 2020-12-07 VITALS — BP 140/62 | HR 75 | Ht 65.0 in | Wt 206.8 lb

## 2020-12-07 DIAGNOSIS — I10 Essential (primary) hypertension: Secondary | ICD-10-CM

## 2020-12-07 DIAGNOSIS — E663 Overweight: Secondary | ICD-10-CM

## 2020-12-07 DIAGNOSIS — E118 Type 2 diabetes mellitus with unspecified complications: Secondary | ICD-10-CM

## 2020-12-07 DIAGNOSIS — Z952 Presence of prosthetic heart valve: Secondary | ICD-10-CM

## 2020-12-07 MED ORDER — METOPROLOL TARTRATE 25 MG PO TABS
12.5000 mg | ORAL_TABLET | Freq: Two times a day (BID) | ORAL | 3 refills | Status: DC
Start: 2020-12-07 — End: 2021-04-04

## 2020-12-07 NOTE — Patient Instructions (Signed)
Medication Instructions:  The current medical regimen is effective;  continue present plan and medications.  *If you need a refill on your cardiac medications before your next appointment, please call your pharmacy*  Follow-Up: At [facility], you and your health needs are our priority.  As part of our continuing mission to provide you with exceptional heart care, we have created designated Provider Care Teams.  These Care Teams include your primary Cardiologist (physician) and Advanced Practice Providers (APPs -  Physician Assistants and Nurse Practitioners) who all work together to provide you with the care you need, when you need it.  We recommend signing up for the patient portal called "MyChart".  Sign up information is provided on this After Visit Summary.  MyChart is used to connect with patients for Virtual Visits (Telemedicine).  Patients are able to view lab/test results, encounter notes, upcoming appointments, etc.  Non-urgent messages can be sent to your provider as well.   To learn more about what you can do with MyChart, go to [email or web address].    Your next appointment:   12 month(s)  The format for your next appointment:   In Person  Provider:   James Hochrein, MD   Thank you for choosing [facility]!!     

## 2020-12-08 DIAGNOSIS — I10 Essential (primary) hypertension: Secondary | ICD-10-CM | POA: Diagnosis not present

## 2020-12-08 DIAGNOSIS — E785 Hyperlipidemia, unspecified: Secondary | ICD-10-CM | POA: Diagnosis not present

## 2020-12-08 DIAGNOSIS — Z9181 History of falling: Secondary | ICD-10-CM | POA: Diagnosis not present

## 2020-12-08 DIAGNOSIS — E119 Type 2 diabetes mellitus without complications: Secondary | ICD-10-CM | POA: Diagnosis not present

## 2020-12-08 DIAGNOSIS — Z7984 Long term (current) use of oral hypoglycemic drugs: Secondary | ICD-10-CM | POA: Diagnosis not present

## 2020-12-08 DIAGNOSIS — M80051D Age-related osteoporosis with current pathological fracture, right femur, subsequent encounter for fracture with routine healing: Secondary | ICD-10-CM | POA: Diagnosis not present

## 2020-12-08 DIAGNOSIS — H409 Unspecified glaucoma: Secondary | ICD-10-CM | POA: Diagnosis not present

## 2020-12-08 DIAGNOSIS — E669 Obesity, unspecified: Secondary | ICD-10-CM | POA: Diagnosis not present

## 2020-12-08 DIAGNOSIS — Z7982 Long term (current) use of aspirin: Secondary | ICD-10-CM | POA: Diagnosis not present

## 2020-12-09 DIAGNOSIS — E669 Obesity, unspecified: Secondary | ICD-10-CM | POA: Diagnosis not present

## 2020-12-09 DIAGNOSIS — I10 Essential (primary) hypertension: Secondary | ICD-10-CM | POA: Diagnosis not present

## 2020-12-09 DIAGNOSIS — Z7982 Long term (current) use of aspirin: Secondary | ICD-10-CM | POA: Diagnosis not present

## 2020-12-09 DIAGNOSIS — Z9181 History of falling: Secondary | ICD-10-CM | POA: Diagnosis not present

## 2020-12-09 DIAGNOSIS — E119 Type 2 diabetes mellitus without complications: Secondary | ICD-10-CM | POA: Diagnosis not present

## 2020-12-09 DIAGNOSIS — H409 Unspecified glaucoma: Secondary | ICD-10-CM | POA: Diagnosis not present

## 2020-12-09 DIAGNOSIS — Z7984 Long term (current) use of oral hypoglycemic drugs: Secondary | ICD-10-CM | POA: Diagnosis not present

## 2020-12-09 DIAGNOSIS — E785 Hyperlipidemia, unspecified: Secondary | ICD-10-CM | POA: Diagnosis not present

## 2020-12-09 DIAGNOSIS — M80051D Age-related osteoporosis with current pathological fracture, right femur, subsequent encounter for fracture with routine healing: Secondary | ICD-10-CM | POA: Diagnosis not present

## 2020-12-13 DIAGNOSIS — Z7982 Long term (current) use of aspirin: Secondary | ICD-10-CM | POA: Diagnosis not present

## 2020-12-13 DIAGNOSIS — Z7984 Long term (current) use of oral hypoglycemic drugs: Secondary | ICD-10-CM | POA: Diagnosis not present

## 2020-12-13 DIAGNOSIS — M80051D Age-related osteoporosis with current pathological fracture, right femur, subsequent encounter for fracture with routine healing: Secondary | ICD-10-CM | POA: Diagnosis not present

## 2020-12-13 DIAGNOSIS — E669 Obesity, unspecified: Secondary | ICD-10-CM | POA: Diagnosis not present

## 2020-12-13 DIAGNOSIS — E119 Type 2 diabetes mellitus without complications: Secondary | ICD-10-CM | POA: Diagnosis not present

## 2020-12-13 DIAGNOSIS — Z9181 History of falling: Secondary | ICD-10-CM | POA: Diagnosis not present

## 2020-12-13 DIAGNOSIS — H409 Unspecified glaucoma: Secondary | ICD-10-CM | POA: Diagnosis not present

## 2020-12-13 DIAGNOSIS — I10 Essential (primary) hypertension: Secondary | ICD-10-CM | POA: Diagnosis not present

## 2020-12-13 DIAGNOSIS — E785 Hyperlipidemia, unspecified: Secondary | ICD-10-CM | POA: Diagnosis not present

## 2020-12-14 DIAGNOSIS — M249 Joint derangement, unspecified: Secondary | ICD-10-CM | POA: Diagnosis not present

## 2020-12-14 DIAGNOSIS — M84352D Stress fracture, left femur, subsequent encounter for fracture with routine healing: Secondary | ICD-10-CM | POA: Diagnosis not present

## 2020-12-14 DIAGNOSIS — Z4789 Encounter for other orthopedic aftercare: Secondary | ICD-10-CM | POA: Diagnosis not present

## 2020-12-14 DIAGNOSIS — M85851 Other specified disorders of bone density and structure, right thigh: Secondary | ICD-10-CM | POA: Diagnosis not present

## 2020-12-14 DIAGNOSIS — M80051D Age-related osteoporosis with current pathological fracture, right femur, subsequent encounter for fracture with routine healing: Secondary | ICD-10-CM | POA: Diagnosis not present

## 2020-12-15 DIAGNOSIS — E785 Hyperlipidemia, unspecified: Secondary | ICD-10-CM | POA: Diagnosis not present

## 2020-12-15 DIAGNOSIS — Z9181 History of falling: Secondary | ICD-10-CM | POA: Diagnosis not present

## 2020-12-15 DIAGNOSIS — H409 Unspecified glaucoma: Secondary | ICD-10-CM | POA: Diagnosis not present

## 2020-12-15 DIAGNOSIS — Z7984 Long term (current) use of oral hypoglycemic drugs: Secondary | ICD-10-CM | POA: Diagnosis not present

## 2020-12-15 DIAGNOSIS — E119 Type 2 diabetes mellitus without complications: Secondary | ICD-10-CM | POA: Diagnosis not present

## 2020-12-15 DIAGNOSIS — Z7982 Long term (current) use of aspirin: Secondary | ICD-10-CM | POA: Diagnosis not present

## 2020-12-15 DIAGNOSIS — M80051D Age-related osteoporosis with current pathological fracture, right femur, subsequent encounter for fracture with routine healing: Secondary | ICD-10-CM | POA: Diagnosis not present

## 2020-12-15 DIAGNOSIS — I10 Essential (primary) hypertension: Secondary | ICD-10-CM | POA: Diagnosis not present

## 2020-12-15 DIAGNOSIS — E669 Obesity, unspecified: Secondary | ICD-10-CM | POA: Diagnosis not present

## 2020-12-20 DIAGNOSIS — Z7982 Long term (current) use of aspirin: Secondary | ICD-10-CM | POA: Diagnosis not present

## 2020-12-20 DIAGNOSIS — Z7984 Long term (current) use of oral hypoglycemic drugs: Secondary | ICD-10-CM | POA: Diagnosis not present

## 2020-12-20 DIAGNOSIS — I10 Essential (primary) hypertension: Secondary | ICD-10-CM | POA: Diagnosis not present

## 2020-12-20 DIAGNOSIS — M80051D Age-related osteoporosis with current pathological fracture, right femur, subsequent encounter for fracture with routine healing: Secondary | ICD-10-CM | POA: Diagnosis not present

## 2020-12-20 DIAGNOSIS — E669 Obesity, unspecified: Secondary | ICD-10-CM | POA: Diagnosis not present

## 2020-12-20 DIAGNOSIS — Z9181 History of falling: Secondary | ICD-10-CM | POA: Diagnosis not present

## 2020-12-20 DIAGNOSIS — E785 Hyperlipidemia, unspecified: Secondary | ICD-10-CM | POA: Diagnosis not present

## 2020-12-20 DIAGNOSIS — H409 Unspecified glaucoma: Secondary | ICD-10-CM | POA: Diagnosis not present

## 2020-12-20 DIAGNOSIS — E119 Type 2 diabetes mellitus without complications: Secondary | ICD-10-CM | POA: Diagnosis not present

## 2020-12-27 DIAGNOSIS — Z7982 Long term (current) use of aspirin: Secondary | ICD-10-CM | POA: Diagnosis not present

## 2020-12-27 DIAGNOSIS — H409 Unspecified glaucoma: Secondary | ICD-10-CM | POA: Diagnosis not present

## 2020-12-27 DIAGNOSIS — E669 Obesity, unspecified: Secondary | ICD-10-CM | POA: Diagnosis not present

## 2020-12-27 DIAGNOSIS — E785 Hyperlipidemia, unspecified: Secondary | ICD-10-CM | POA: Diagnosis not present

## 2020-12-27 DIAGNOSIS — E119 Type 2 diabetes mellitus without complications: Secondary | ICD-10-CM | POA: Diagnosis not present

## 2020-12-27 DIAGNOSIS — I10 Essential (primary) hypertension: Secondary | ICD-10-CM | POA: Diagnosis not present

## 2020-12-27 DIAGNOSIS — Z9181 History of falling: Secondary | ICD-10-CM | POA: Diagnosis not present

## 2020-12-27 DIAGNOSIS — M80051D Age-related osteoporosis with current pathological fracture, right femur, subsequent encounter for fracture with routine healing: Secondary | ICD-10-CM | POA: Diagnosis not present

## 2020-12-27 DIAGNOSIS — Z7984 Long term (current) use of oral hypoglycemic drugs: Secondary | ICD-10-CM | POA: Diagnosis not present

## 2021-01-02 DIAGNOSIS — S7291XA Unspecified fracture of right femur, initial encounter for closed fracture: Secondary | ICD-10-CM | POA: Diagnosis not present

## 2021-01-02 DIAGNOSIS — H409 Unspecified glaucoma: Secondary | ICD-10-CM | POA: Diagnosis not present

## 2021-01-02 DIAGNOSIS — E785 Hyperlipidemia, unspecified: Secondary | ICD-10-CM | POA: Diagnosis not present

## 2021-01-02 DIAGNOSIS — E119 Type 2 diabetes mellitus without complications: Secondary | ICD-10-CM | POA: Diagnosis not present

## 2021-01-02 DIAGNOSIS — M80051D Age-related osteoporosis with current pathological fracture, right femur, subsequent encounter for fracture with routine healing: Secondary | ICD-10-CM | POA: Diagnosis not present

## 2021-01-02 DIAGNOSIS — Z9181 History of falling: Secondary | ICD-10-CM | POA: Diagnosis not present

## 2021-01-02 DIAGNOSIS — I10 Essential (primary) hypertension: Secondary | ICD-10-CM | POA: Diagnosis not present

## 2021-01-02 DIAGNOSIS — E669 Obesity, unspecified: Secondary | ICD-10-CM | POA: Diagnosis not present

## 2021-01-02 DIAGNOSIS — Z7984 Long term (current) use of oral hypoglycemic drugs: Secondary | ICD-10-CM | POA: Diagnosis not present

## 2021-01-02 DIAGNOSIS — Z7982 Long term (current) use of aspirin: Secondary | ICD-10-CM | POA: Diagnosis not present

## 2021-01-03 DIAGNOSIS — Z9181 History of falling: Secondary | ICD-10-CM | POA: Diagnosis not present

## 2021-01-03 DIAGNOSIS — H409 Unspecified glaucoma: Secondary | ICD-10-CM | POA: Diagnosis not present

## 2021-01-03 DIAGNOSIS — E119 Type 2 diabetes mellitus without complications: Secondary | ICD-10-CM | POA: Diagnosis not present

## 2021-01-03 DIAGNOSIS — E669 Obesity, unspecified: Secondary | ICD-10-CM | POA: Diagnosis not present

## 2021-01-03 DIAGNOSIS — Z7982 Long term (current) use of aspirin: Secondary | ICD-10-CM | POA: Diagnosis not present

## 2021-01-03 DIAGNOSIS — I10 Essential (primary) hypertension: Secondary | ICD-10-CM | POA: Diagnosis not present

## 2021-01-03 DIAGNOSIS — E785 Hyperlipidemia, unspecified: Secondary | ICD-10-CM | POA: Diagnosis not present

## 2021-01-03 DIAGNOSIS — Z7984 Long term (current) use of oral hypoglycemic drugs: Secondary | ICD-10-CM | POA: Diagnosis not present

## 2021-01-03 DIAGNOSIS — M80051D Age-related osteoporosis with current pathological fracture, right femur, subsequent encounter for fracture with routine healing: Secondary | ICD-10-CM | POA: Diagnosis not present

## 2021-01-10 DIAGNOSIS — Z7984 Long term (current) use of oral hypoglycemic drugs: Secondary | ICD-10-CM | POA: Diagnosis not present

## 2021-01-10 DIAGNOSIS — Z9181 History of falling: Secondary | ICD-10-CM | POA: Diagnosis not present

## 2021-01-10 DIAGNOSIS — H409 Unspecified glaucoma: Secondary | ICD-10-CM | POA: Diagnosis not present

## 2021-01-10 DIAGNOSIS — E785 Hyperlipidemia, unspecified: Secondary | ICD-10-CM | POA: Diagnosis not present

## 2021-01-10 DIAGNOSIS — M80051D Age-related osteoporosis with current pathological fracture, right femur, subsequent encounter for fracture with routine healing: Secondary | ICD-10-CM | POA: Diagnosis not present

## 2021-01-10 DIAGNOSIS — E119 Type 2 diabetes mellitus without complications: Secondary | ICD-10-CM | POA: Diagnosis not present

## 2021-01-10 DIAGNOSIS — I10 Essential (primary) hypertension: Secondary | ICD-10-CM | POA: Diagnosis not present

## 2021-01-10 DIAGNOSIS — Z7982 Long term (current) use of aspirin: Secondary | ICD-10-CM | POA: Diagnosis not present

## 2021-01-10 DIAGNOSIS — E669 Obesity, unspecified: Secondary | ICD-10-CM | POA: Diagnosis not present

## 2021-01-30 DIAGNOSIS — S7291XA Unspecified fracture of right femur, initial encounter for closed fracture: Secondary | ICD-10-CM | POA: Diagnosis not present

## 2021-03-02 DIAGNOSIS — S7291XA Unspecified fracture of right femur, initial encounter for closed fracture: Secondary | ICD-10-CM | POA: Diagnosis not present

## 2021-03-15 DIAGNOSIS — M84352D Stress fracture, left femur, subsequent encounter for fracture with routine healing: Secondary | ICD-10-CM | POA: Diagnosis not present

## 2021-03-15 DIAGNOSIS — M84352S Stress fracture, left femur, sequela: Secondary | ICD-10-CM | POA: Diagnosis not present

## 2021-03-15 DIAGNOSIS — T458X5S Adverse effect of other primarily systemic and hematological agents, sequela: Secondary | ICD-10-CM | POA: Diagnosis not present

## 2021-03-15 DIAGNOSIS — Z9181 History of falling: Secondary | ICD-10-CM | POA: Diagnosis not present

## 2021-03-15 DIAGNOSIS — M810 Age-related osteoporosis without current pathological fracture: Secondary | ICD-10-CM | POA: Diagnosis not present

## 2021-04-01 DIAGNOSIS — S7291XA Unspecified fracture of right femur, initial encounter for closed fracture: Secondary | ICD-10-CM | POA: Diagnosis not present

## 2021-04-04 ENCOUNTER — Ambulatory Visit: Payer: Medicare PPO | Admitting: Nurse Practitioner

## 2021-04-04 ENCOUNTER — Other Ambulatory Visit: Payer: Self-pay

## 2021-04-04 ENCOUNTER — Encounter: Payer: Self-pay | Admitting: Nurse Practitioner

## 2021-04-04 VITALS — BP 119/66 | HR 70 | Temp 98.2°F | Resp 20 | Ht 65.0 in | Wt 209.0 lb

## 2021-04-04 DIAGNOSIS — E119 Type 2 diabetes mellitus without complications: Secondary | ICD-10-CM

## 2021-04-04 DIAGNOSIS — M810 Age-related osteoporosis without current pathological fracture: Secondary | ICD-10-CM

## 2021-04-04 DIAGNOSIS — E782 Mixed hyperlipidemia: Secondary | ICD-10-CM | POA: Diagnosis not present

## 2021-04-04 DIAGNOSIS — Z6838 Body mass index (BMI) 38.0-38.9, adult: Secondary | ICD-10-CM | POA: Diagnosis not present

## 2021-04-04 DIAGNOSIS — E611 Iron deficiency: Secondary | ICD-10-CM | POA: Diagnosis not present

## 2021-04-04 DIAGNOSIS — Z953 Presence of xenogenic heart valve: Secondary | ICD-10-CM | POA: Diagnosis not present

## 2021-04-04 DIAGNOSIS — I10 Essential (primary) hypertension: Secondary | ICD-10-CM | POA: Diagnosis not present

## 2021-04-04 LAB — HEMOGLOBIN, FINGERSTICK: Hemoglobin: 12.6 g/dL (ref 11.1–15.9)

## 2021-04-04 LAB — BAYER DCA HB A1C WAIVED: HB A1C (BAYER DCA - WAIVED): 7.3 % — ABNORMAL HIGH (ref <7.0)

## 2021-04-04 MED ORDER — ATORVASTATIN CALCIUM 10 MG PO TABS: 10.0000 mg | ORAL_TABLET | Freq: Every day | ORAL | 1 refills | Status: DC

## 2021-04-04 MED ORDER — METOPROLOL TARTRATE 25 MG PO TABS
12.5000 mg | ORAL_TABLET | Freq: Two times a day (BID) | ORAL | 1 refills | Status: DC
Start: 2021-04-04 — End: 2021-10-06

## 2021-04-04 MED ORDER — LISINOPRIL 20 MG PO TABS
20.0000 mg | ORAL_TABLET | Freq: Every day | ORAL | 1 refills | Status: DC
Start: 2021-04-04 — End: 2021-10-06

## 2021-04-04 MED ORDER — HYDROCHLOROTHIAZIDE 25 MG PO TABS: 25.0000 mg | ORAL_TABLET | Freq: Every day | ORAL | 1 refills | Status: DC

## 2021-04-04 MED ORDER — METFORMIN HCL ER 500 MG PO TB24: 500.0000 mg | ORAL_TABLET | Freq: Two times a day (BID) | ORAL | 1 refills | Status: DC

## 2021-04-04 NOTE — Progress Notes (Signed)
Subjective:    Patient ID: Toni Cooper, female    DOB: 1946/06/11, 75 y.o.   MRN: [id number]   Chief Complaint: medical management of chronic issues     HPI:  1. Primary hypertension No c/o chest pain ,sob or headache. Does not check blood pressure at home. BP Readings from Last 3 Encounters:  12/07/20 140/62  10/11/20 (!) 147/69  04/08/20 134/68     2. Mixed hyperlipidemia Does try to watch diet but does no exercise. Lab Results  Component Value Date   CHOL 98 (L) 04/04/2017   HDL 38 (L) 04/04/2017   LDLCALC 49 04/04/2017   TRIG 54 04/04/2017   CHOLHDL 2.6 04/04/2017     3. Type 2 diabetes mellitus without complication, without long-term current use of insulin (HCC) Sees Dr. Talmage Nap for diabetes. Has follow up appointment Thursday this week. Her last hgba1c was 7.0. her fasting blood sugars are running around 90-110. tis morning it was 140. She has occasional lows  4. Age-related osteoporosis without current pathological fracture Last dexascan was done. Her t score was -3.4. she does no weight bearing exercises. She has had 2 pathological fractures in both femurs since last visit. They are saying it came from fosamax. They stopped fosamax and started her on forteo. Says her hemoglobin was low after last surgery.  5. Class 2 severe obesity due to excess calories with serious comorbidity and body mass index (BMI) of 38.0 to 38.9 in adult [facility]) No recent weight gains. Wt Readings from Last 3 Encounters:  12/07/20 206 lb 12.8 oz (93.8 kg)  10/11/20 208 lb (94.3 kg)  04/08/20 221 lb (100.2 kg)   BMI Readings from Last 3 Encounters:  12/07/20 34.41 kg/m  10/11/20 34.61 kg/m  04/08/20 36.78 kg/m       Outpatient Encounter Medications as of 04/04/2021  Medication Sig  . acetaminophen (TYLENOL) 500 MG tablet Take 500 mg 2 (two) times daily as needed by mouth for moderate pain or headache.   . Ascorbic Acid (VITAMIN C) 1000 MG tablet Take 1,000 mg daily by mouth.  [name]  aspirin 81 MG EC tablet Take 1 tablet (81 mg total) by mouth daily.  [name] atorvastatin (LIPITOR) 10 MG tablet Take 1 tablet (10 mg total) by mouth daily.  . beta carotene w/minerals (OCUVITE) tablet Take 1 tablet by mouth daily.  . calcium carbonate (TUMS - DOSED IN MG ELEMENTAL CALCIUM) 500 MG chewable tablet Chew 1 tablet by mouth daily as needed for indigestion or heartburn.  . calcium-vitamin D (OSCAL 500/200 D-3) 500-200 MG-UNIT tablet Take 1 tablet by mouth 2 (two) times daily.  . cholecalciferol (VITAMIN D) 1000 UNITS tablet Take 1,000 Units by mouth daily.  . fluticasone (FLONASE) 50 MCG/ACT nasal spray Place 1 spray into both nostrils daily as needed for allergies or rhinitis.  [name] HUMALOG 100 UNIT/ML injection Inject 6-20 Units into the skin 3 (three) times daily with meals. Using sliding scale.  . hydrochlorothiazide (HYDRODIURIL) 25 MG tablet Take 1 tablet (25 mg total) by mouth daily.  [name] LANTUS 100 UNIT/ML injection Inject 35 Units into the skin at bedtime.   [name] lisinopril (ZESTRIL) 20 MG tablet Take 1 tablet (20 mg total) by mouth daily.  [name] LORazepam (ATIVAN) 0.5 MG tablet Take 1 tablet (0.5 mg total) by mouth 2 (two) times daily as needed for anxiety.  . Magnesium 250 MG TABS Take 250 mg by mouth daily.   . metFORMIN (GLUCOPHAGE-XR) 500 MG 24 hr tablet Take 1  tablet (500 mg total) by mouth 2 (two) times daily.  . metoprolol tartrate (LOPRESSOR) 25 MG tablet Take 0.5 tablets (12.5 mg total) by mouth 2 (two) times daily.  . Multiple Vitamins-Minerals (MULTI-BETIC DIABETES) TABS Take 1 tablet daily by mouth.  . pyridOXINE (VITAMIN B-6) 100 MG tablet Take 100 mg by mouth daily.  [name] TEST test strip Test four times daily.   No facility-administered encounter medications on file as of 04/04/2021.    Past Surgical History:  Procedure Laterality Date  . ABDOMINAL HYSTERECTOMY    . APPENDECTOMY    . FEMUR FRACTURE SURGERY  07/30/2020  . FRACTURE SURGERY     shoulder  . TEE WITHOUT  CARDIOVERSION N/A 04/18/2016   Procedure: TRANSESOPHAGEAL ECHOCARDIOGRAM (TEE);  Surgeon: Vesta Mixer, MD;  Location: [facility] ENDOSCOPY;  Service: Cardiovascular;  Laterality: N/A;  . TEE WITHOUT CARDIOVERSION N/A 10/16/2017   Procedure: TRANSESOPHAGEAL ECHOCARDIOGRAM (TEE);  Surgeon: Purcell Nails, MD;  [facility];  Service: Open Heart Surgery;  Laterality: N/A;  . WRIST SURGERY      Family History  Problem Relation Age of Onset  . Stroke Father 3  . Diabetes Father   . Retinopathy of prematurity Father   . Heart failure Mother   . Heart attack Maternal Grandmother   . Heart attack Paternal Grandmother   . Diabetes Paternal Aunt   . Diabetes Paternal Uncle     New complaints: none  Social history:  lives with her husband  Controlled substance contract: n/a    Review of Systems  Constitutional: Negative for diaphoresis.  Eyes: Negative for pain.  Respiratory: Negative for shortness of breath.   Cardiovascular: Negative for chest pain, palpitations and leg swelling.  Gastrointestinal: Negative for abdominal pain.  Endocrine: Negative for polydipsia.  Skin: Negative for rash.  Neurological: Negative for dizziness, weakness and headaches.  Hematological: Does not bruise/bleed easily.  All other systems reviewed and are negative.      Objective:   Physical Exam Vitals and nursing note reviewed.  Constitutional:      General: She is not in acute distress.    Appearance: Normal appearance. She is well-developed.  HENT:     Head: Normocephalic.     Nose: Nose normal.  Eyes:     Pupils: Pupils are equal, round, and reactive to light.  Neck:     Vascular: No carotid bruit or JVD.  Cardiovascular:     Rate and Rhythm: Normal rate and regular rhythm.     Heart sounds: Murmur (2/6 systolic) heard.    Pulmonary:     Effort: Pulmonary effort is normal. No respiratory distress.     Breath sounds: Normal breath sounds. No wheezing or rales.  Chest:     Chest  wall: No tenderness.  Abdominal:     General: Bowel sounds are normal. There is no distension or abdominal bruit.     Palpations: Abdomen is soft. There is no hepatomegaly, splenomegaly, mass or pulsatile mass.     Tenderness: There is no abdominal tenderness.  Musculoskeletal:        General: Normal range of motion.     Cervical back: Normal range of motion and neck supple.     Comments: Walking with cane for balance  Lymphadenopathy:     Cervical: No cervical adenopathy.  Skin:    General: Skin is warm and dry.  Neurological:     Mental Status: She is alert and oriented to person, place, and time.  Deep Tendon Reflexes: Reflexes are normal and symmetric.  Psychiatric:        Behavior: Behavior normal.        Thought Content: Thought content normal.        Judgment: Judgment normal.     BP 119/66   Pulse 70   Temp 98.2 F (36.8 C) (Temporal)   Resp 20   Ht $R'5\' 5"'kH$  (1.651 m)   Wt 209 lb (94.8 kg)   SpO2 98%   BMI 34.78 kg/m   hgb 12.6  HGBA1c 7.3%    Assessment & Plan:  Toni Cooper comes in today with chief complaint of Medical Management of Chronic Issues   Diagnosis and orders addressed:  1. Primary hypertension Low sodium diet - CBC with Differential/Platelet - CMP14+EGFR - hydrochlorothiazide (HYDRODIURIL) 25 MG tablet; Take 1 tablet (25 mg total) by mouth daily.  Dispense: 90 tablet; Refill: 1 - lisinopril (ZESTRIL) 20 MG tablet; Take 1 tablet (20 mg total) by mouth daily.  Dispense: 90 tablet; Refill: 1  2. Mixed hyperlipidemia Low fat diet - Lipid panel - atorvastatin (LIPITOR) 10 MG tablet; Take 1 tablet (10 mg total) by mouth daily.  Dispense: 90 tablet; Refill: 1  3. Type 2 diabetes mellitus without complication, without long-term current use of insulin (HCC) continue to watch carbs in diet Keep follow up with Dr. Chalmers Cater - Bayer DCA Hb A1c Waived - metFORMIN (GLUCOPHAGE-XR) 500 MG 24 hr tablet; Take 1 tablet (500 mg total) by mouth 2 (two) times  daily.  Dispense: 180 tablet; Refill: 1  4. Age-related osteoporosis without current pathological fracture Weight bearing exercise.  5. Low iron - Hemoglobin, fingerstick  6. S/P minimally invasive aortic valve replacement with bioprosthetic valve Keep follow up with cardiology yearly - metoprolol tartrate (LOPRESSOR) 25 MG tablet; Take 0.5 tablets (12.5 mg total) by mouth 2 (two) times daily.  Dispense: 90 tablet; Refill: 1  7. Class 2 severe obesity due to excess calories with serious comorbidity and body mass index (BMI) of 38.0 to 38.9 in adult [facility]) Discussed diet and exercise for person with BMI >25 Will recheck weight in 3-6 months     Labs pending Health Maintenance reviewed Diet and exercise encouraged  Follow up plan: 6 months   Mary-Margaret Hassell Done, FNP

## 2021-04-04 NOTE — Patient Instructions (Signed)
Fall Prevention in the Home, Adult Falls can cause injuries and can happen to people of all ages. There are many things you can do to make your home safe and to help prevent falls. Ask for help when making these changes. What actions can I take to prevent falls? General Instructions  Use good lighting in all rooms. Replace any light bulbs that burn out.  Turn on the lights in dark areas. Use night-lights.  Keep items that you use often in easy-to-reach places. Lower the shelves around your home if needed.  Set up your furniture so you have a clear path. Avoid moving your furniture around.  Do not have throw rugs or other things on the floor that can make you trip.  Avoid walking on wet floors.  If any of your floors are uneven, fix them.  Add color or contrast paint or tape to clearly mark and help you see: ? Grab bars or handrails. ? First and last steps of staircases. ? Where the edge of each step is.  If you use a stepladder: ? Make sure that it is fully opened. Do not climb a closed stepladder. ? Make sure the sides of the stepladder are locked in place. ? Ask someone to hold the stepladder while you use it.  Know where your pets are when moving through your home. What can I do in the bathroom?  Keep the floor dry. Clean up any water on the floor right away.  Remove soap buildup in the tub or shower.  Use nonskid mats or decals on the floor of the tub or shower.  Attach bath mats securely with double-sided, nonslip rug tape.  If you need to sit down in the shower, use a plastic, nonslip stool.  Install grab bars by the toilet and in the tub and shower. Do not use towel bars as grab bars.      What can I do in the bedroom?  Make sure that you have a light by your bed that is easy to reach.  Do not use any sheets or blankets for your bed that hang to the floor.  Have a firm chair with side arms that you can use for support when you get dressed. What can I do in  the kitchen?  Clean up any spills right away.  If you need to reach something above you, use a step stool with a grab bar.  Keep electrical cords out of the way.  Do not use floor polish or wax that makes floors slippery. What can I do with my stairs?  Do not leave any items on the stairs.  Make sure that you have a light switch at the top and the bottom of the stairs.  Make sure that there are handrails on both sides of the stairs. Fix handrails that are broken or loose.  Install nonslip stair treads on all your stairs.  Avoid having throw rugs at the top or bottom of the stairs.  Choose a carpet that does not hide the edge of the steps on the stairs.  Check carpeting to make sure that it is firmly attached to the stairs. Fix carpet that is loose or worn. What can I do on the outside of my home?  Use bright outdoor lighting.  Fix the edges of walkways and driveways and fix any cracks.  Remove anything that might make you trip as you walk through a door, such as a raised step or threshold.  Trim any   bushes or trees on paths to your home.  Check to see if handrails are loose or broken and that both sides of all steps have handrails.  Install guardrails along the edges of any raised decks and porches.  Clear paths of anything that can make you trip, such as tools or rocks.  Have leaves, snow, or ice cleared regularly.  Use sand or salt on paths during winter.  Clean up any spills in your garage right away. This includes grease or oil spills. What other actions can I take?  Wear shoes that: ? Have a low heel. Do not wear high heels. ? Have rubber bottoms. ? Feel good on your feet and fit well. ? Are closed at the toe. Do not wear open-toe sandals.  Use tools that help you move around if needed. These include: ? Canes. ? Walkers. ? Scooters. ? Crutches.  Review your medicines with your doctor. Some medicines can make you feel dizzy. This can increase your chance  of falling. Ask your doctor what else you can do to help prevent falls. Where to find more information  Centers for Disease Control and Prevention, [employer]AD[employer]: www.cdc.gov  National Institute on Aging: www.nia.nih.gov Contact a doctor if:  You are afraid of falling at home.  You feel weak, drowsy, or dizzy at home.  You fall at home. Summary  There are many simple things that you can do to make your home safe and to help prevent falls.  Ways to make your home safe include removing things that can make you trip and installing grab bars in the bathroom.  Ask for help when making these changes in your home. This information is not intended to replace advice given to you by your health care provider. Make sure you discuss any questions you have with your health care provider. Document Revised: 06/08/2020 Document Reviewed: 06/08/2020 Elsevier Patient Education  2021 Elsevier Inc.  

## 2021-04-05 LAB — CMP14+EGFR
ALT: 16 IU/L (ref 0–32)
AST: 18 IU/L (ref 0–40)
Albumin/Globulin Ratio: 1.5 (ref 1.2–2.2)
Albumin: 4.1 g/dL (ref 3.7–4.7)
Alkaline Phosphatase: 116 IU/L (ref 44–121)
BUN/Creatinine Ratio: 31 — ABNORMAL HIGH (ref 12–28)
BUN: 27 mg/dL (ref 8–27)
Bilirubin Total: 0.5 mg/dL (ref 0.0–1.2)
CO2: 25 mmol/L (ref 20–29)
Calcium: 10.3 mg/dL (ref 8.7–10.3)
Chloride: 103 mmol/L (ref 96–106)
Creatinine, Ser: 0.86 mg/dL (ref 0.57–1.00)
Globulin, Total: 2.8 g/dL (ref 1.5–4.5)
Glucose: 115 mg/dL — ABNORMAL HIGH (ref 65–99)
Potassium: 4.2 mmol/L (ref 3.5–5.2)
Sodium: 144 mmol/L (ref 134–144)
Total Protein: 6.9 g/dL (ref 6.0–8.5)
eGFR: 70 mL/min/{1.73_m2} (ref >59)

## 2021-04-05 LAB — LIPID PANEL
Chol/HDL Ratio: 2.8 ratio (ref 0.0–4.4)
Cholesterol, Total: 109 mg/dL (ref 100–199)
HDL: 39 mg/dL — ABNORMAL LOW (ref >39)
LDL Chol Calc (NIH): 49 mg/dL (ref 0–99)
Triglycerides: 112 mg/dL (ref 0–149)
VLDL Cholesterol Cal: 21 mg/dL (ref 5–40)

## 2021-04-05 LAB — CBC WITH DIFFERENTIAL/PLATELET
Basophils Absolute: 0.1 10*3/uL (ref 0.0–0.2)
Basos: 1 %
EOS (ABSOLUTE): 0.1 10*3/uL (ref 0.0–0.4)
Eos: 2 %
Hematocrit: 41.5 % (ref 34.0–46.6)
Hemoglobin: 13.1 g/dL (ref 11.1–15.9)
Immature Grans (Abs): 0 10*3/uL (ref 0.0–0.1)
Immature Granulocytes: 0 %
Lymphocytes Absolute: 2 10*3/uL (ref 0.7–3.1)
Lymphs: 23 %
MCH: 29 pg (ref 26.6–33.0)
MCHC: 31.6 g/dL (ref 31.5–35.7)
MCV: 92 fL (ref 79–97)
Monocytes Absolute: 0.8 10*3/uL (ref 0.1–0.9)
Monocytes: 10 %
Neutrophils Absolute: 5.5 10*3/uL (ref 1.4–7.0)
Neutrophils: 64 %
Platelets: 303 10*3/uL (ref 150–450)
RBC: 4.52 x10E6/uL (ref 3.77–5.28)
RDW: 13.6 % (ref 11.7–15.4)
WBC: 8.6 10*3/uL (ref 3.4–10.8)

## 2021-04-06 DIAGNOSIS — E7800 Pure hypercholesterolemia, unspecified: Secondary | ICD-10-CM | POA: Diagnosis not present

## 2021-04-06 DIAGNOSIS — E559 Vitamin D deficiency, unspecified: Secondary | ICD-10-CM | POA: Diagnosis not present

## 2021-04-06 DIAGNOSIS — E1165 Type 2 diabetes mellitus with hyperglycemia: Secondary | ICD-10-CM | POA: Diagnosis not present

## 2021-04-06 DIAGNOSIS — M899 Disorder of bone, unspecified: Secondary | ICD-10-CM | POA: Diagnosis not present

## 2021-04-06 DIAGNOSIS — I10 Essential (primary) hypertension: Secondary | ICD-10-CM | POA: Diagnosis not present

## 2021-04-06 DIAGNOSIS — G609 Hereditary and idiopathic neuropathy, unspecified: Secondary | ICD-10-CM | POA: Diagnosis not present

## 2021-05-02 DIAGNOSIS — S7291XA Unspecified fracture of right femur, initial encounter for closed fracture: Secondary | ICD-10-CM | POA: Diagnosis not present

## 2021-05-02 DIAGNOSIS — E1165 Type 2 diabetes mellitus with hyperglycemia: Secondary | ICD-10-CM | POA: Diagnosis not present

## 2021-05-11 DIAGNOSIS — H40033 Anatomical narrow angle, bilateral: Secondary | ICD-10-CM | POA: Diagnosis not present

## 2021-05-11 DIAGNOSIS — E119 Type 2 diabetes mellitus without complications: Secondary | ICD-10-CM | POA: Diagnosis not present

## 2021-05-19 DIAGNOSIS — Z1231 Encounter for screening mammogram for malignant neoplasm of breast: Secondary | ICD-10-CM | POA: Diagnosis not present

## 2021-06-01 DIAGNOSIS — S7291XA Unspecified fracture of right femur, initial encounter for closed fracture: Secondary | ICD-10-CM | POA: Diagnosis not present

## 2021-06-01 DIAGNOSIS — E1165 Type 2 diabetes mellitus with hyperglycemia: Secondary | ICD-10-CM | POA: Diagnosis not present

## 2021-06-14 DIAGNOSIS — M84352D Stress fracture, left femur, subsequent encounter for fracture with routine healing: Secondary | ICD-10-CM | POA: Diagnosis not present

## 2021-06-14 DIAGNOSIS — S7221XG Displaced subtrochanteric fracture of right femur, subsequent encounter for closed fracture with delayed healing: Secondary | ICD-10-CM | POA: Insufficient documentation

## 2021-06-14 DIAGNOSIS — M461 Sacroiliitis, not elsewhere classified: Secondary | ICD-10-CM | POA: Diagnosis not present

## 2021-06-14 DIAGNOSIS — Z888 Allergy status to other drugs, medicaments and biological substances status: Secondary | ICD-10-CM | POA: Diagnosis not present

## 2021-06-14 DIAGNOSIS — Z96698 Presence of other orthopedic joint implants: Secondary | ICD-10-CM | POA: Diagnosis not present

## 2021-06-14 DIAGNOSIS — Z882 Allergy status to sulfonamides status: Secondary | ICD-10-CM | POA: Diagnosis not present

## 2021-06-14 DIAGNOSIS — M85852 Other specified disorders of bone density and structure, left thigh: Secondary | ICD-10-CM | POA: Diagnosis not present

## 2021-06-14 DIAGNOSIS — M80051G Age-related osteoporosis with current pathological fracture, right femur, subsequent encounter for fracture with delayed healing: Secondary | ICD-10-CM | POA: Diagnosis not present

## 2021-06-14 DIAGNOSIS — M84750D Atypical femoral fracture, unspecified, subsequent encounter for fracture with routine healing: Secondary | ICD-10-CM | POA: Diagnosis not present

## 2021-06-14 DIAGNOSIS — M160 Bilateral primary osteoarthritis of hip: Secondary | ICD-10-CM | POA: Diagnosis not present

## 2021-06-14 DIAGNOSIS — M85851 Other specified disorders of bone density and structure, right thigh: Secondary | ICD-10-CM | POA: Diagnosis not present

## 2021-07-01 ENCOUNTER — Other Ambulatory Visit: Payer: Self-pay | Admitting: Nurse Practitioner

## 2021-07-01 DIAGNOSIS — I10 Essential (primary) hypertension: Secondary | ICD-10-CM

## 2021-07-02 DIAGNOSIS — E1165 Type 2 diabetes mellitus with hyperglycemia: Secondary | ICD-10-CM | POA: Diagnosis not present

## 2021-07-02 DIAGNOSIS — S7291XA Unspecified fracture of right femur, initial encounter for closed fracture: Secondary | ICD-10-CM | POA: Diagnosis not present

## 2021-07-18 ENCOUNTER — Other Ambulatory Visit: Payer: Self-pay

## 2021-07-18 ENCOUNTER — Other Ambulatory Visit: Payer: Self-pay | Admitting: Dermatology

## 2021-07-18 ENCOUNTER — Ambulatory Visit: Payer: Medicare PPO | Admitting: Dermatology

## 2021-07-18 DIAGNOSIS — D179 Benign lipomatous neoplasm, unspecified: Secondary | ICD-10-CM

## 2021-07-18 DIAGNOSIS — L918 Other hypertrophic disorders of the skin: Secondary | ICD-10-CM | POA: Diagnosis not present

## 2021-07-18 DIAGNOSIS — Z86007 Personal history of in-situ neoplasm of skin: Secondary | ICD-10-CM | POA: Diagnosis not present

## 2021-07-18 DIAGNOSIS — Z1283 Encounter for screening for malignant neoplasm of skin: Secondary | ICD-10-CM | POA: Diagnosis not present

## 2021-07-18 DIAGNOSIS — D1722 Benign lipomatous neoplasm of skin and subcutaneous tissue of left arm: Secondary | ICD-10-CM | POA: Diagnosis not present

## 2021-07-18 DIAGNOSIS — L821 Other seborrheic keratosis: Secondary | ICD-10-CM | POA: Diagnosis not present

## 2021-07-18 DIAGNOSIS — D485 Neoplasm of uncertain behavior of skin: Secondary | ICD-10-CM | POA: Diagnosis not present

## 2021-07-18 NOTE — Patient Instructions (Signed)

## 2021-07-25 ENCOUNTER — Encounter: Payer: Self-pay | Admitting: Dermatology

## 2021-07-25 NOTE — Progress Notes (Signed)
   Follow-Up Visit   Subjective  Toni Cooper is a 75 y.o. female who presents for the following: Annual Exam (Here for skin exam. Had previous SCC on left thigh. Marcelina Morel tag mole on left groin crease. ).  General skin examination, growth and irritation of lesion above left leg crease Location:  Duration:  Quality:  Associated Signs/Symptoms: Modifying Factors:  Severity:  Timing: Context:   Objective  Well appearing patient in no apparent distress; mood and affect are within normal limits. Mid Back Full body skin exam: No atypical pigmented lesions.  Left Thigh - Anterior No sign recurrence  Mid Back Multiple 4 to 8 mm flattopped brown textured papules  Left Forearm - Posterior Doughy 2 cm subcutaneous nodule  Left Inguinal Area Soft flesh-colored 9 mm exophytic papule compatible with irritated fibroepithelial polyp.  Patient requests biopsy.       A full examination was performed including scalp, head, eyes, ears, nose, lips, neck, chest, axillae, abdomen, back, buttocks, bilateral upper extremities, bilateral lower extremities, hands, feet, fingers, toes, fingernails, and toenails. All findings within normal limits unless otherwise noted below.  Areas beneath undergarments not fully examined.   Assessment & Plan    Screening for malignant neoplasm of skin Mid Back  Yearly skin exam.  Encouraged to self examine with spouse twice annually.  History of squamous cell carcinoma in situ (SCCIS) Left Thigh - Anterior  Recheck as needed clinical change  Seborrheic keratosis Mid Back  Benign no treatment needed.  Lipoma, unspecified site Left Forearm - Posterior  Leave if stable  Neoplasm of uncertain behavior of skin Left Inguinal Area  Skin / nail biopsy Type of biopsy: tangential   Informed consent: discussed and consent obtained   Timeout: patient name, date of birth, surgical site, and procedure verified   Anesthesia: the lesion was anesthetized in a  standard fashion   Anesthetic:  1% lidocaine w/ epinephrine 1-100,000 local infiltration Instrument used: flexible razor blade   Hemostasis achieved with: ferric subsulfate   Outcome: patient tolerated procedure well   Post-procedure details: wound care instructions given    Specimen 1 - Surgical pathology Differential Diagnosis: Tag  Check Margins: No      I, Lavonna Monarch, MD, have reviewed all documentation for this visit.  The documentation on 07/25/21 for the exam, diagnosis, procedures, and orders are all accurate and complete.

## 2021-08-02 ENCOUNTER — Encounter (HOSPITAL_COMMUNITY): Payer: Self-pay

## 2021-08-02 ENCOUNTER — Emergency Department (HOSPITAL_COMMUNITY)
Admission: EM | Admit: 2021-08-02 | Discharge: 2021-08-02 | Disposition: A | Discharge status: Home / Self Care | Payer: Medicare PPO | Attending: Emergency Medicine | Admitting: Emergency Medicine

## 2021-08-02 DIAGNOSIS — S8992XA Unspecified injury of left lower leg, initial encounter: Secondary | ICD-10-CM | POA: Diagnosis present

## 2021-08-02 DIAGNOSIS — S81802A Unspecified open wound, left lower leg, initial encounter: Secondary | ICD-10-CM | POA: Diagnosis not present

## 2021-08-02 DIAGNOSIS — I10 Essential (primary) hypertension: Secondary | ICD-10-CM | POA: Diagnosis not present

## 2021-08-02 DIAGNOSIS — Z794 Long term (current) use of insulin: Secondary | ICD-10-CM | POA: Diagnosis not present

## 2021-08-02 DIAGNOSIS — Z79899 Other long term (current) drug therapy: Secondary | ICD-10-CM | POA: Insufficient documentation

## 2021-08-02 DIAGNOSIS — Z7984 Long term (current) use of oral hypoglycemic drugs: Secondary | ICD-10-CM | POA: Insufficient documentation

## 2021-08-02 DIAGNOSIS — Y93E1 Activity, personal bathing and showering: Secondary | ICD-10-CM | POA: Diagnosis not present

## 2021-08-02 DIAGNOSIS — E1165 Type 2 diabetes mellitus with hyperglycemia: Secondary | ICD-10-CM | POA: Diagnosis not present

## 2021-08-02 DIAGNOSIS — Z85820 Personal history of malignant melanoma of skin: Secondary | ICD-10-CM | POA: Insufficient documentation

## 2021-08-02 DIAGNOSIS — W268XXA Contact with other sharp object(s), not elsewhere classified, initial encounter: Secondary | ICD-10-CM | POA: Insufficient documentation

## 2021-08-02 DIAGNOSIS — Z7982 Long term (current) use of aspirin: Secondary | ICD-10-CM | POA: Insufficient documentation

## 2021-08-02 DIAGNOSIS — S91012A Laceration without foreign body, left ankle, initial encounter: Secondary | ICD-10-CM | POA: Diagnosis not present

## 2021-08-02 DIAGNOSIS — E119 Type 2 diabetes mellitus without complications: Secondary | ICD-10-CM | POA: Insufficient documentation

## 2021-08-02 DIAGNOSIS — S7291XA Unspecified fracture of right femur, initial encounter for closed fracture: Secondary | ICD-10-CM | POA: Diagnosis not present

## 2021-08-02 DIAGNOSIS — I959 Hypotension, unspecified: Secondary | ICD-10-CM | POA: Diagnosis not present

## 2021-08-02 LAB — CBC WITH DIFFERENTIAL/PLATELET
Abs Immature Granulocytes: 0.07 10*3/uL (ref 0.00–0.07)
Basophils Absolute: 0.1 10*3/uL (ref 0.0–0.1)
Basophils Relative: 1 %
Eosinophils Absolute: 0.1 10*3/uL (ref 0.0–0.5)
Eosinophils Relative: 0 %
HCT: 37.8 % (ref 36.0–46.0)
Hemoglobin: 11.8 g/dL — ABNORMAL LOW (ref 12.0–15.0)
Immature Granulocytes: 1 %
Lymphocytes Relative: 10 %
Lymphs Abs: 1.4 10*3/uL (ref 0.7–4.0)
MCH: 29.9 pg (ref 26.0–34.0)
MCHC: 31.2 g/dL (ref 30.0–36.0)
MCV: 95.7 fL (ref 80.0–100.0)
Monocytes Absolute: 0.7 10*3/uL (ref 0.1–1.0)
Monocytes Relative: 5 %
Neutro Abs: 12.2 10*3/uL — ABNORMAL HIGH (ref 1.7–7.7)
Neutrophils Relative %: 83 %
Platelets: 243 10*3/uL (ref 150–400)
RBC: 3.95 MIL/uL (ref 3.87–5.11)
RDW: 14.6 % (ref 11.5–15.5)
WBC: 14.5 10*3/uL — ABNORMAL HIGH (ref 4.0–10.5)
nRBC: 0 % (ref 0.0–0.2)

## 2021-08-02 NOTE — ED Provider Notes (Signed)
[facility] EMERGENCY DEPARTMENT Provider Note   CSN: [id number] Arrival date & time: 08/02/21  1205     History Chief Complaint  Patient presents with   Extremity Laceration    Pt knicked left lower leg while shaving, lost "a lot of blood" per ems    PACE PUMA is a 75 y.o. female.  HPI  75 year old female with a history of aortic stenosis, cataracts, diabetes, dyspnea, hypertension, osteoporosis, obesity, aortic valve replacement, squamous cell carcinoma, who presents to the emergency department today for evaluation of a wound to the left lower extremity.  Patient states she was shaving her legs today prior to getting a pedicure when she cut her varicose vein.  She reports that she can get the bleeding to stop at home so contacted EMS who advised her to come to the ED.  She is not anticoagulated.  Past Medical History:  Diagnosis Date   Aortic stenosis, severe 2015   Cataract    Diabetes mellitus without complication ([place])    Dyspnea    Hypertension    Obesity    Osteoporosis    S/P minimally invasive aortic valve replacement with bioprosthetic valve 10/16/2017   23 mm Edwards Intuity Elite rapid deployment bovine stented bioprosthetic tissue valve via partial upper sternotomy   Squamous cell carcinoma of skin 03/30/2020   KA on left thigh, anterior(CX35FU)    Patient Active Problem List   Diagnosis Date Noted   Obesity    S/P minimally invasive aortic valve replacement with bioprosthetic valve 10/16/2017   Murmur 05/12/2014   Hypertension 09/07/2013   Hyperlipidemia 09/07/2013   Diabetes ([place]) 09/07/2013   Osteoporosis 09/07/2013    Past Surgical History:  Procedure Laterality Date   APPENDECTOMY     FEMUR FRACTURE SURGERY  07/30/2020   FRACTURE SURGERY     shoulder   TEE WITHOUT CARDIOVERSION N/A 04/18/2016   Procedure: TRANSESOPHAGEAL ECHOCARDIOGRAM (TEE);  Surgeon: Thayer Headings, MD;  Location: [place];  Service: Cardiovascular;  Laterality: N/A;    TEE WITHOUT CARDIOVERSION N/A 10/16/2017   Procedure: TRANSESOPHAGEAL ECHOCARDIOGRAM (TEE);  Surgeon: Rexene Alberts, MD;  Location: [place];  Service: Open Heart Surgery;  Laterality: N/A;   TOTAL ABDOMINAL HYSTERECTOMY     WRIST SURGERY       OB History   No obstetric history on file.     Family History  Problem Relation Age of Onset   Stroke Father 43   Diabetes Father    Retinopathy of prematurity Father    Heart failure Mother    Heart attack Maternal Grandmother    Heart attack Paternal Grandmother    Diabetes Paternal Aunt    Diabetes Paternal Uncle     Social History   Tobacco Use   Smoking status: Never   Smokeless tobacco: Never  Substance Use Topics   Alcohol use: No    Alcohol/week: 0.0 standard drinks   Drug use: No    Home Medications Prior to Admission medications   Medication Sig Start Date End Date Taking? Authorizing Provider  acetaminophen (TYLENOL) 500 MG tablet Take 500 mg 2 (two) times daily as needed by mouth for moderate pain or headache.     [provider]  Ascorbic Acid (VITAMIN C) 1000 MG tablet Take 1,000 mg daily by mouth.    [provider]  aspirin 81 MG EC tablet Take 1 tablet (81 mg total) by mouth daily. 01/13/18   Rexene Alberts, MD  atorvastatin (LIPITOR) 10 MG tablet  Take 1 tablet (10 mg total) by mouth daily. 04/04/21   Hassell Done Mary-Margaret, FNP  beta carotene w/minerals (OCUVITE) tablet Take 1 tablet by mouth daily.    [provider]  calcium-vitamin D (OSCAL 500/200 D-3) 500-200 MG-UNIT tablet Take 1 tablet by mouth 2 (two) times daily. 12/10/16   Cherre Robins, PharmD  cholecalciferol (VITAMIN D) 1000 UNITS tablet Take 1,000 Units by mouth daily.    [provider]  fluticasone (FLONASE) 50 MCG/ACT nasal spray Place 1 spray into both nostrils daily as needed for allergies or rhinitis.    [provider]  HUMALOG 100 UNIT/ML injection Inject 6-20 Units into the skin 3 (three) times  daily with meals. Using sliding scale. 08/27/13   [provider]  hydrochlorothiazide (HYDRODIURIL) 25 MG tablet TAKE 1 TABLET EVERY DAY 07/03/21   Hassell Done, Mary-Margaret, FNP  LANTUS 100 UNIT/ML injection Inject 35 Units into the skin at bedtime.  07/25/13   [provider]  lisinopril (ZESTRIL) 20 MG tablet Take 1 tablet (20 mg total) by mouth daily. 04/04/21   Hassell Done Mary-Margaret, FNP  Magnesium 250 MG TABS Take 250 mg by mouth daily.     [provider]  Magnesium Oxide 250 MG TABS 1 tablet with a meal    [provider]  metFORMIN (GLUCOPHAGE-XR) 500 MG 24 hr tablet Take 1 tablet (500 mg total) by mouth 2 (two) times daily. 04/04/21   Hassell Done Mary-Margaret, FNP  metoprolol tartrate (LOPRESSOR) 25 MG tablet Take 0.5 tablets (12.5 mg total) by mouth 2 (two) times daily. 04/04/21   Hassell Done Mary-Margaret, FNP  Multiple Vitamins-Minerals (MULTI-BETIC DIABETES) TABS Take 1 tablet daily by mouth.    [provider]  pyridOXINE (VITAMIN B-6) 100 MG tablet Take 100 mg by mouth daily.    [provider]  Teriparatide, Recombinant, (FORTEO [place]) Inject into the skin.    [provider]  TRUETRACK TEST test strip Test four times daily. 08/19/13   [provider]    Allergies    Sulfa antibiotics  Review of Systems   Review of Systems  Constitutional:  Negative for fever.  Skin:  Positive for wound.   Physical Exam Updated Vital Signs BP (!) 138/55   Pulse 73   Temp 98.1 F (36.7 C) (Oral)   Resp 18   Ht '5\' 4"'$  (1.626 m)   Wt 96.6 kg   SpO2 97%   BMI 36.56 kg/m   Physical Exam Vitals and nursing note reviewed.  Constitutional:      General: She is not in acute distress.    Appearance: She is well-developed.  HENT:     Head: Normocephalic and atraumatic.  Eyes:     Conjunctiva/sclera: Conjunctivae normal.  Cardiovascular:     Rate and Rhythm: Normal rate.  Pulmonary:     Effort: Pulmonary effort is normal.   Musculoskeletal:        General: Normal range of motion.     Cervical back: Neck supple.  Skin:    General: Skin is warm and dry.     Comments: Punctate area of venous bleeding to the LLE  along a varicose vein   Neurological:     Mental Status: She is alert.    ED Results / Procedures / Treatments   Labs (all labs ordered are listed, but only abnormal results are displayed) Labs Reviewed  CBC WITH DIFFERENTIAL/PLATELET - Abnormal; Notable for the following components:      Result Value   WBC 14.5 (*)  Hemoglobin 11.8 (*)    Neutro Abs 12.2 (*)    All other components within normal limits    EKG None  Radiology No results found.  Procedures Procedures   Medications Ordered in ED Medications - No data to display  ED Course  I have reviewed the triage vital signs and the nursing notes.  Pertinent labs & imaging results that were available during my care of the patient were reviewed by me and considered in my medical decision making (see chart for details).    MDM Rules/Calculators/A&P                          75 year old female presenting the emergency department today for evaluation of a bleeding varicose vein that occurred while shaving prior to arrival.  Quick clot and a pressure dressing was applied.  On reassessment hemostasis has been achieved. Dermabond was placed over the wound to assist with hemostasis going forward.  Patient hemodynamically stable.  Hemoglobin stable.  Feel she is appropriate for discharge with close follow-up with PCP and strict return precautions.   Final Clinical Impression(s) / ED Diagnoses Final diagnoses:  Wound of left lower extremity, initial encounter    Rx / DC Orders ED Discharge Orders     None        Bishop Dublin 08/02/21 1519    Margette Fast, MD 08/06/21 1740

## 2021-08-02 NOTE — Discharge Instructions (Addendum)
Please follow up with your primary care provider within 5-7 days for re-evaluation of your symptoms. If you do not have a primary care provider, information for a healthcare clinic has been provided for you to make arrangements for follow up care. Please return to the emergency department for any new or worsening symptoms. ° °

## 2021-08-23 ENCOUNTER — Other Ambulatory Visit: Payer: Self-pay

## 2021-08-23 ENCOUNTER — Ambulatory Visit (INDEPENDENT_AMBULATORY_CARE_PROVIDER_SITE_OTHER): Payer: Medicare PPO

## 2021-08-23 DIAGNOSIS — Z23 Encounter for immunization: Secondary | ICD-10-CM | POA: Diagnosis not present

## 2021-09-01 DIAGNOSIS — E1165 Type 2 diabetes mellitus with hyperglycemia: Secondary | ICD-10-CM | POA: Diagnosis not present

## 2021-10-02 DIAGNOSIS — E1165 Type 2 diabetes mellitus with hyperglycemia: Secondary | ICD-10-CM | POA: Diagnosis not present

## 2021-10-03 ENCOUNTER — Telehealth: Payer: Self-pay | Admitting: Nurse Practitioner

## 2021-10-03 NOTE — Telephone Encounter (Signed)
Left message for patient to call back and schedule Medicare Annual Wellness Visit (AWV) either virtually or phone I left my number for patient to call [phone].also left office #   due 06/20/2011 awvi per [facility]  please schedule at anytime with health coach  This should be a 45 minute visit.

## 2021-10-06 ENCOUNTER — Encounter: Payer: Self-pay | Admitting: Nurse Practitioner

## 2021-10-06 ENCOUNTER — Other Ambulatory Visit: Payer: Self-pay

## 2021-10-06 ENCOUNTER — Ambulatory Visit: Payer: Medicare PPO | Admitting: Nurse Practitioner

## 2021-10-06 VITALS — BP 140/71 | HR 67 | Temp 98.8°F | Resp 20 | Ht 64.0 in | Wt 216.0 lb

## 2021-10-06 DIAGNOSIS — I10 Essential (primary) hypertension: Secondary | ICD-10-CM | POA: Diagnosis not present

## 2021-10-06 DIAGNOSIS — F419 Anxiety disorder, unspecified: Secondary | ICD-10-CM

## 2021-10-06 DIAGNOSIS — E782 Mixed hyperlipidemia: Secondary | ICD-10-CM

## 2021-10-06 DIAGNOSIS — E119 Type 2 diabetes mellitus without complications: Secondary | ICD-10-CM

## 2021-10-06 DIAGNOSIS — Z953 Presence of xenogenic heart valve: Secondary | ICD-10-CM | POA: Diagnosis not present

## 2021-10-06 DIAGNOSIS — Z6838 Body mass index (BMI) 38.0-38.9, adult: Secondary | ICD-10-CM

## 2021-10-06 DIAGNOSIS — M810 Age-related osteoporosis without current pathological fracture: Secondary | ICD-10-CM

## 2021-10-06 LAB — BAYER DCA HB A1C WAIVED: HB A1C (BAYER DCA - WAIVED): 6.9 % — ABNORMAL HIGH (ref 4.8–5.6)

## 2021-10-06 MED ORDER — METFORMIN HCL ER 500 MG PO TB24: 500.0000 mg | ORAL_TABLET | Freq: Two times a day (BID) | ORAL | 1 refills | Status: DC

## 2021-10-06 MED ORDER — METOPROLOL TARTRATE 25 MG PO TABS: 12.5000 mg | ORAL_TABLET | Freq: Two times a day (BID) | ORAL | 1 refills | Status: DC

## 2021-10-06 MED ORDER — HYDROCHLOROTHIAZIDE 25 MG PO TABS: 25.0000 mg | ORAL_TABLET | Freq: Every day | ORAL | 1 refills | Status: DC

## 2021-10-06 MED ORDER — LORAZEPAM 0.5 MG PO TABS
0.5000 mg | ORAL_TABLET | Freq: Two times a day (BID) | ORAL | 1 refills | Status: DC | PRN
Start: 2021-10-06 — End: 2022-04-05

## 2021-10-06 MED ORDER — ATORVASTATIN CALCIUM 10 MG PO TABS: 10.0000 mg | ORAL_TABLET | Freq: Every day | ORAL | 1 refills | Status: DC

## 2021-10-06 MED ORDER — LISINOPRIL 20 MG PO TABS: 20.0000 mg | ORAL_TABLET | Freq: Every day | ORAL | 1 refills | Status: DC

## 2021-10-06 NOTE — Progress Notes (Signed)
Subjective:    Patient ID: Toni Cooper, female    DOB: Nov 02, 1946, 75 y.o.   MRN: [id number]   Chief Complaint: No orders of the defined types were placed in this encounter.    HPI:  1. Mixed hyperlipidemia Does not watch diet and does no dedicated exercise. Lab Results  Component Value Date   CHOL 109 04/04/2021   HDL 39 (L) 04/04/2021   LDLCALC 49 04/04/2021   TRIG 112 04/04/2021   CHOLHDL 2.8 04/04/2021     2. Type 2 diabetes mellitus without complication, without long-term current use of insulin (HCC) Patient checks her blood sugars 4x a day. She has Elenor Legato and that has really helped. She is on sliding scale insulin 3x a day. She says her blood sugars have been like a yoyo. She sees Dr. Chalmers Cater  3. Primary hypertension No C/O chest pain, sob or headache. Does not check blood pressure at home. BP Readings from Last 3 Encounters:  10/06/21 140/71  08/02/21 (!) 138/55  04/04/21 119/66     4. Age-related osteoporosis without current pathological fracture Last dexascan was done 11/2016. T score was -3.4. patient is on forteo. She see osteoporosis specialist at [facility] and has follow up appointment in Jan 2023.  5. Class 2 severe obesity due to excess calories with serious comorbidity and body mass index (BMI) of 38.0 to 38.9 in adult [facility]) No recent weight changes Wt Readings from Last 3 Encounters:  10/06/21 216 lb (98 kg)  08/02/21 213 lb (96.6 kg)  04/04/21 209 lb (94.8 kg)   BMI Readings from Last 3 Encounters:  10/06/21 37.08 kg/m  08/02/21 36.56 kg/m  04/04/21 34.78 kg/m      Outpatient Encounter Medications as of 10/06/2021  Medication Sig   acetaminophen (TYLENOL) 500 MG tablet Take 500 mg 2 (two) times daily as needed by mouth for moderate pain or headache.    Ascorbic Acid (VITAMIN C) 1000 MG tablet Take 1,000 mg daily by mouth.   aspirin 81 MG EC tablet Take 1 tablet (81 mg total) by mouth daily.   atorvastatin (LIPITOR) 10 MG tablet Take 1 tablet (10  mg total) by mouth daily.   beta carotene w/minerals (OCUVITE) tablet Take 1 tablet by mouth daily.   calcium-vitamin D (OSCAL 500/200 D-3) 500-200 MG-UNIT tablet Take 1 tablet by mouth 2 (two) times daily.   cholecalciferol (VITAMIN D) 1000 UNITS tablet Take 1,000 Units by mouth daily.   fluticasone (FLONASE) 50 MCG/ACT nasal spray Place 1 spray into both nostrils daily as needed for allergies or rhinitis.   HUMALOG 100 UNIT/ML injection Inject 6-20 Units into the skin 3 (three) times daily with meals. Using sliding scale.   hydrochlorothiazide (HYDRODIURIL) 25 MG tablet TAKE 1 TABLET EVERY DAY   LANTUS 100 UNIT/ML injection Inject 35 Units into the skin at bedtime.    lisinopril (ZESTRIL) 20 MG tablet Take 1 tablet (20 mg total) by mouth daily.   Magnesium 250 MG TABS Take 250 mg by mouth daily.    Magnesium Oxide 250 MG TABS 1 tablet with a meal   metFORMIN (GLUCOPHAGE-XR) 500 MG 24 hr tablet Take 1 tablet (500 mg total) by mouth 2 (two) times daily.   metoprolol tartrate (LOPRESSOR) 25 MG tablet Take 0.5 tablets (12.5 mg total) by mouth 2 (two) times daily.   Multiple Vitamins-Minerals (MULTI-BETIC DIABETES) TABS Take 1 tablet daily by mouth.   pyridOXINE (VITAMIN B-6) 100 MG tablet Take 100 mg by mouth daily.  Teriparatide, Recombinant, (FORTEO [place]) Inject into the skin.   TRUETRACK TEST test strip Test four times daily.   No facility-administered encounter medications on file as of 10/06/2021.    Past Surgical History:  Procedure Laterality Date   APPENDECTOMY     FEMUR FRACTURE SURGERY  07/30/2020   FRACTURE SURGERY     shoulder   TEE WITHOUT CARDIOVERSION N/A 04/18/2016   Procedure: TRANSESOPHAGEAL ECHOCARDIOGRAM (TEE);  Surgeon: Thayer Headings, MD;  Location: [facility];  Service: Cardiovascular;  Laterality: N/A;   TEE WITHOUT CARDIOVERSION N/A 10/16/2017   Procedure: TRANSESOPHAGEAL ECHOCARDIOGRAM (TEE);  Surgeon: Rexene Alberts, MD;  Location: [place];  Service: Open Heart  Surgery;  Laterality: N/A;   TOTAL ABDOMINAL HYSTERECTOMY     WRIST SURGERY      Family History  Problem Relation Age of Onset   Stroke Father 35   Diabetes Father    Retinopathy of prematurity Father    Heart failure Mother    Heart attack Maternal Grandmother    Heart attack Paternal Grandmother    Diabetes Paternal Aunt    Diabetes Paternal Uncle     New complaints: Has been having a lot of anxiety lately. She use to be on lorazepam which helped. She stopped that several years ago and feelsl ike she needs to have again. GAD 7 : Generalized Anxiety Score 10/06/2021 04/08/2020 10/09/2019  Nervous, Anxious, on Edge 0 0 0  Control/stop worrying 0 0 0  Worry too much - different things 0 0 0  Trouble relaxing 0 0 0  Restless 0 0 0  Easily annoyed or irritable 0 0 0  Afraid - awful might happen 0 0 0  Total GAD 7 Score 0 0 0  Anxiety Difficulty Not difficult at all - Not difficult at all      Social history: Lives with her husband  Controlled substance contract: n/a     Review of Systems  Constitutional:  Negative for diaphoresis.  Eyes:  Negative for pain.  Respiratory:  Negative for shortness of breath.   Cardiovascular:  Negative for chest pain, palpitations and leg swelling.  Gastrointestinal:  Negative for abdominal pain.  Endocrine: Negative for polydipsia.  Skin:  Negative for rash.  Neurological:  Negative for dizziness, weakness and headaches.  Hematological:  Does not bruise/bleed easily.  All other systems reviewed and are negative.     Objective:   Physical Exam Vitals and nursing note reviewed.  Constitutional:      General: She is not in acute distress.    Appearance: Normal appearance. She is well-developed.  HENT:     Head: Normocephalic.     Right Ear: Tympanic membrane normal.     Left Ear: Tympanic membrane normal.     Nose: Nose normal.     Mouth/Throat:     Mouth: Mucous membranes are moist.  Eyes:     Pupils: Pupils are equal,  round, and reactive to light.  Neck:     Vascular: No carotid bruit or JVD.  Cardiovascular:     Rate and Rhythm: Normal rate and regular rhythm.     Heart sounds: Normal heart sounds.  Pulmonary:     Effort: Pulmonary effort is normal. No respiratory distress.     Breath sounds: Normal breath sounds. No wheezing or rales.  Chest:     Chest wall: No tenderness.  Abdominal:     General: Bowel sounds are normal. There is no distension or abdominal bruit.  Palpations: Abdomen is soft. There is no hepatomegaly, splenomegaly, mass or pulsatile mass.     Tenderness: There is no abdominal tenderness.  Musculoskeletal:        General: Normal range of motion.     Cervical back: Normal range of motion and neck supple.     Right lower leg: Edema (1+) present.     Left lower leg: Edema (1+) present.  Lymphadenopathy:     Cervical: No cervical adenopathy.  Skin:    General: Skin is warm and dry.  Neurological:     Mental Status: She is alert and oriented to person, place, and time.     Deep Tendon Reflexes: Reflexes are normal and symmetric.  Psychiatric:        Behavior: Behavior normal.        Thought Content: Thought content normal.        Judgment: Judgment normal.    BP 140/71   Pulse 67   Temp 98.8 F (37.1 C) (Temporal)   Resp 20   Ht $R'5\' 4"'ep$  (1.626 m)   Wt 216 lb (98 kg)   SpO2 100%   BMI 37.08 kg/m   HGBA1c 6.9%     Assessment & Plan:  Toni Cooper comes in today with chief complaint of Medical Management of Chronic Issues   Diagnosis and orders addressed:  1. Mixed hyperlipidemia Low fat diet - Lipid panel - atorvastatin (LIPITOR) 10 MG tablet; Take 1 tablet (10 mg total) by mouth daily.  Dispense: 90 tablet; Refill: 1  2. Type 2 diabetes mellitus without complication, without long-term current use of insulin (HCC) Continue to follow up with Dr. Chalmers Cater - Bayer DCA Hb A1c Waived - metFORMIN (GLUCOPHAGE-XR) 500 MG 24 hr tablet; Take 1 tablet (500 mg total) by  mouth 2 (two) times daily.  Dispense: 180 tablet; Refill: 1  3. Primary hypertension Low sodium diet - CBC with Differential/Platelet - CMP14+EGFR - hydrochlorothiazide (HYDRODIURIL) 25 MG tablet; Take 1 tablet (25 mg total) by mouth daily.  Dispense: 90 tablet; Refill: 1 - lisinopril (ZESTRIL) 20 MG tablet; Take 1 tablet (20 mg total) by mouth daily.  Dispense: 90 tablet; Refill: 1  4. Age-related osteoporosis without current pathological fracture Weight bearing exercise Keep appointment with specialist  5. Class 2 severe obesity due to excess calories with serious comorbidity and body mass index (BMI) of 38.0 to 38.9 in adult [facility]) Discussed diet and exercise for person with BMI >25 Will recheck weight in 3-6 months   6. S/P minimally invasive aortic valve replacement with bioprosthetic valve Keep follow up with cardiology - metoprolol tartrate (LOPRESSOR) 25 MG tablet; Take 0.5 tablets (12.5 mg total) by mouth 2 (two) times daily.  Dispense: 90 tablet; Refill: 1  7. Anxiety Stress management - LORazepam (ATIVAN) 0.5 MG tablet; Take 1 tablet (0.5 mg total) by mouth 2 (two) times daily as needed for anxiety.  Dispense: 30 tablet; Refill: 1   Labs pending Health Maintenance reviewed Diet and exercise encouraged  Follow up plan: 6 months   Mary-Margaret Hassell Done, FNP

## 2021-10-06 NOTE — Patient Instructions (Signed)

## 2021-10-07 LAB — CBC WITH DIFFERENTIAL/PLATELET
Basophils Absolute: 0.1 10*3/uL (ref 0.0–0.2)
Basos: 1 %
EOS (ABSOLUTE): 0.3 10*3/uL (ref 0.0–0.4)
Eos: 3 %
Hematocrit: 34.9 % (ref 34.0–46.6)
Hemoglobin: 10.7 g/dL — ABNORMAL LOW (ref 11.1–15.9)
Immature Grans (Abs): 0 10*3/uL (ref 0.0–0.1)
Immature Granulocytes: 0 %
Lymphocytes Absolute: 1.9 10*3/uL (ref 0.7–3.1)
Lymphs: 24 %
MCH: 26.8 pg (ref 26.6–33.0)
MCHC: 30.7 g/dL — ABNORMAL LOW (ref 31.5–35.7)
MCV: 88 fL (ref 79–97)
Monocytes Absolute: 0.8 10*3/uL (ref 0.1–0.9)
Monocytes: 10 %
Neutrophils Absolute: 4.9 10*3/uL (ref 1.4–7.0)
Neutrophils: 62 %
Platelets: 323 10*3/uL (ref 150–450)
RBC: 3.99 x10E6/uL (ref 3.77–5.28)
RDW: 13.9 % (ref 11.7–15.4)
WBC: 7.9 10*3/uL (ref 3.4–10.8)

## 2021-10-07 LAB — CMP14+EGFR
ALT: 15 IU/L (ref 0–32)
AST: 16 IU/L (ref 0–40)
Albumin/Globulin Ratio: 1.5 (ref 1.2–2.2)
Albumin: 4 g/dL (ref 3.7–4.7)
Alkaline Phosphatase: 104 IU/L (ref 44–121)
BUN/Creatinine Ratio: 27 (ref 12–28)
BUN: 22 mg/dL (ref 8–27)
Bilirubin Total: 0.2 mg/dL (ref 0.0–1.2)
CO2: 26 mmol/L (ref 20–29)
Calcium: 10.1 mg/dL (ref 8.7–10.3)
Chloride: 103 mmol/L (ref 96–106)
Creatinine, Ser: 0.82 mg/dL (ref 0.57–1.00)
Globulin, Total: 2.6 g/dL (ref 1.5–4.5)
Glucose: 161 mg/dL — ABNORMAL HIGH (ref 70–99)
Potassium: 4.4 mmol/L (ref 3.5–5.2)
Sodium: 144 mmol/L (ref 134–144)
Total Protein: 6.6 g/dL (ref 6.0–8.5)
eGFR: 74 mL/min/{1.73_m2} (ref >59)

## 2021-10-07 LAB — LIPID PANEL
Chol/HDL Ratio: 2.9 ratio (ref 0.0–4.4)
Cholesterol, Total: 109 mg/dL (ref 100–199)
HDL: 38 mg/dL — ABNORMAL LOW (ref >39)
LDL Chol Calc (NIH): 53 mg/dL (ref 0–99)
Triglycerides: 91 mg/dL (ref 0–149)
VLDL Cholesterol Cal: 18 mg/dL (ref 5–40)

## 2021-10-09 NOTE — Progress Notes (Signed)
Returning nurse call.

## 2021-10-11 DIAGNOSIS — E559 Vitamin D deficiency, unspecified: Secondary | ICD-10-CM | POA: Diagnosis not present

## 2021-10-11 DIAGNOSIS — G609 Hereditary and idiopathic neuropathy, unspecified: Secondary | ICD-10-CM | POA: Diagnosis not present

## 2021-10-11 DIAGNOSIS — M899 Disorder of bone, unspecified: Secondary | ICD-10-CM | POA: Diagnosis not present

## 2021-10-11 DIAGNOSIS — E1165 Type 2 diabetes mellitus with hyperglycemia: Secondary | ICD-10-CM | POA: Diagnosis not present

## 2021-10-11 DIAGNOSIS — I10 Essential (primary) hypertension: Secondary | ICD-10-CM | POA: Diagnosis not present

## 2021-10-11 DIAGNOSIS — E7800 Pure hypercholesterolemia, unspecified: Secondary | ICD-10-CM | POA: Diagnosis not present

## 2021-10-16 ENCOUNTER — Ambulatory Visit (INDEPENDENT_AMBULATORY_CARE_PROVIDER_SITE_OTHER): Payer: Medicare PPO

## 2021-10-16 VITALS — Ht 64.0 in | Wt 216.0 lb

## 2021-10-16 DIAGNOSIS — G609 Hereditary and idiopathic neuropathy, unspecified: Secondary | ICD-10-CM | POA: Insufficient documentation

## 2021-10-16 DIAGNOSIS — E1140 Type 2 diabetes mellitus with diabetic neuropathy, unspecified: Secondary | ICD-10-CM | POA: Insufficient documentation

## 2021-10-16 DIAGNOSIS — E1165 Type 2 diabetes mellitus with hyperglycemia: Secondary | ICD-10-CM | POA: Insufficient documentation

## 2021-10-16 DIAGNOSIS — Z0000 Encounter for general adult medical examination without abnormal findings: Secondary | ICD-10-CM | POA: Diagnosis not present

## 2021-10-16 NOTE — Patient Instructions (Signed)
Toni Cooper , Thank you for taking time to come for your Medicare Wellness Visit. I appreciate your ongoing commitment to your health goals. Please review the following plan we discussed and let me know if I can assist you in the future.   Screening recommendations/referrals: Colonoscopy: No longer required.  Mammogram: Done 05/19/2021 Repeat annually  Bone Density: 07/2020, Osteoporosis Repeat every 2 years  Recommended yearly ophthalmology/optometry visit for glaucoma screening and checkup Recommended yearly dental visit for hygiene and checkup  Vaccinations: Influenza vaccine: Done 08/23/2021 Repeat annually  Pneumococcal vaccine: Done 09/07/2013 and 09/13/2014 Tdap vaccine: Due Repeat in 10 years  Shingles vaccine: Shingrix discussed. Please contact your pharmacy for coverage information.     Covid-19:Done 01/18/2020, 02/15/2020 and 10/03/2020  Advanced directives: Please bring a copy of your health care power of attorney and living will to the office to be added to your chart at your convenience.   Conditions/risks identified: Aim for 30 minutes of exercise or walking each day, drink 6-8 glasses of water and eat lots of fruits and vegetables.   Next appointment: Follow up in one year for your annual wellness visit 2023.   Preventive Care 11 Years and Older, Female Preventive care refers to lifestyle choices and visits with your health care provider that can promote health and wellness. What does preventive care include? A yearly physical exam. This is also called an annual well check. Dental exams once or twice a year. Routine eye exams. Ask your health care provider how often you should have your eyes checked. Personal lifestyle choices, including: Daily care of your teeth and gums. Regular physical activity. Eating a healthy diet. Avoiding tobacco and drug use. Limiting alcohol use. Practicing safe sex. Taking low-dose aspirin every day. Taking vitamin and mineral  supplements as recommended by your health care provider. What happens during an annual well check? The services and screenings done by your health care provider during your annual well check will depend on your age, overall health, lifestyle risk factors, and family history of disease. Counseling  Your health care provider may ask you questions about your: Alcohol use. Tobacco use. Drug use. Emotional well-being. Home and relationship well-being. Sexual activity. Eating habits. History of falls. Memory and ability to understand (cognition). Work and work Statistician. Reproductive health. Screening  You may have the following tests or measurements: Height, weight, and BMI. Blood pressure. Lipid and cholesterol levels. These may be checked every 5 years, or more frequently if you are over 20 years old. Skin check. Lung cancer screening. You may have this screening every year starting at age 61 if you have a 30-pack-year history of smoking and currently smoke or have quit within the past 15 years. Fecal occult blood test (FOBT) of the stool. You may have this test every year starting at age 63. Flexible sigmoidoscopy or colonoscopy. You may have a sigmoidoscopy every 5 years or a colonoscopy every 10 years starting at age 45. Hepatitis C blood test. Hepatitis B blood test. Sexually transmitted disease (STD) testing. Diabetes screening. This is done by checking your blood sugar (glucose) after you have not eaten for a while (fasting). You may have this done every 1-3 years. Bone density scan. This is done to screen for osteoporosis. You may have this done starting at age 50. Mammogram. This may be done every 1-2 years. Talk to your health care provider about how often you should have regular mammograms. Talk with your health care provider about your test results, treatment options, and  if necessary, the need for more tests. Vaccines  Your health care provider may recommend certain  vaccines, such as: Influenza vaccine. This is recommended every year. Tetanus, diphtheria, and acellular pertussis (Tdap, Td) vaccine. You may need a Td booster every 10 years. Zoster vaccine. You may need this after age 67. Pneumococcal 13-valent conjugate (PCV13) vaccine. One dose is recommended after age 51. Pneumococcal polysaccharide (PPSV23) vaccine. One dose is recommended after age 91. Talk to your health care provider about which screenings and vaccines you need and how often you need them. This information is not intended to replace advice given to you by your health care provider. Make sure you discuss any questions you have with your health care provider. Document Released: 12/02/2015 Document Revised: 07/25/2016 Document Reviewed: 09/06/2015 Elsevier Interactive Patient Education  2017 [place] Prevention in the Home Falls can cause injuries. They can happen to people of all ages. There are many things you can do to make your home safe and to help prevent falls. What can I do on the outside of my home? Regularly fix the edges of walkways and driveways and fix any cracks. Remove anything that might make you trip as you walk through a door, such as a raised step or threshold. Trim any bushes or trees on the path to your home. Use bright outdoor lighting. Clear any walking paths of anything that might make someone trip, such as rocks or tools. Regularly check to see if handrails are loose or broken. Make sure that both sides of any steps have handrails. Any raised decks and porches should have guardrails on the edges. Have any leaves, snow, or ice cleared regularly. Use sand or salt on walking paths during winter. Clean up any spills in your garage right away. This includes oil or grease spills. What can I do in the bathroom? Use night lights. Install grab bars by the toilet and in the tub and shower. Do not use towel bars as grab bars. Use non-skid mats or decals in  the tub or shower. If you need to sit down in the shower, use a plastic, non-slip stool. Keep the floor dry. Clean up any water that spills on the floor as soon as it happens. Remove soap buildup in the tub or shower regularly. Attach bath mats securely with double-sided non-slip rug tape. Do not have throw rugs and other things on the floor that can make you trip. What can I do in the bedroom? Use night lights. Make sure that you have a light by your bed that is easy to reach. Do not use any sheets or blankets that are too big for your bed. They should not hang down onto the floor. Have a firm chair that has side arms. You can use this for support while you get dressed. Do not have throw rugs and other things on the floor that can make you trip. What can I do in the kitchen? Clean up any spills right away. Avoid walking on wet floors. Keep items that you use a lot in easy-to-reach places. If you need to reach something above you, use a strong step stool that has a grab bar. Keep electrical cords out of the way. Do not use floor polish or wax that makes floors slippery. If you must use wax, use non-skid floor wax. Do not have throw rugs and other things on the floor that can make you trip. What can I do with my stairs? Do not leave any  items on the stairs. Make sure that there are handrails on both sides of the stairs and use them. Fix handrails that are broken or loose. Make sure that handrails are as long as the stairways. Check any carpeting to make sure that it is firmly attached to the stairs. Fix any carpet that is loose or worn. Avoid having throw rugs at the top or bottom of the stairs. If you do have throw rugs, attach them to the floor with carpet tape. Make sure that you have a light switch at the top of the stairs and the bottom of the stairs. If you do not have them, ask someone to add them for you. What else can I do to help prevent falls? Wear shoes that: Do not have high  heels. Have rubber bottoms. Are comfortable and fit you well. Are closed at the toe. Do not wear sandals. If you use a stepladder: Make sure that it is fully opened. Do not climb a closed stepladder. Make sure that both sides of the stepladder are locked into place. Ask someone to hold it for you, if possible. Clearly mark and make sure that you can see: Any grab bars or handrails. First and last steps. Where the edge of each step is. Use tools that help you move around (mobility aids) if they are needed. These include: Canes. Walkers. Scooters. Crutches. Turn on the lights when you go into a dark area. Replace any light bulbs as soon as they burn out. Set up your furniture so you have a clear path. Avoid moving your furniture around. If any of your floors are uneven, fix them. If there are any pets around you, be aware of where they are. Review your medicines with your doctor. Some medicines can make you feel dizzy. This can increase your chance of falling. Ask your doctor what other things that you can do to help prevent falls. This information is not intended to replace advice given to you by your health care provider. Make sure you discuss any questions you have with your health care provider. Document Released: 09/01/2009 Document Revised: 04/12/2016 Document Reviewed: 12/10/2014 Elsevier Interactive Patient Education  2017 [employer].

## 2021-10-16 NOTE — Progress Notes (Signed)
Subjective:   Toni Cooper is a 75 y.o. female who presents for an Initial Medicare Annual Wellness Visit. Virtual Visit via Telephone Note  I connected with  Toni Cooper on 10/16/21 at  9:00 AM EST by telephone and verified that I am speaking with the correct person using two identifiers.  Location: Patient: Home Provider: [facility] Persons participating in the virtual visit: patient/Nurse Health Advisor   I discussed the limitations, risks, security and privacy concerns of performing an evaluation and management service by telephone and the availability of in person appointments. The patient expressed understanding and agreed to proceed.  Interactive audio and video telecommunications were attempted between this nurse and patient, however failed, due to patient having technical difficulties OR patient did not have access to video capability.  We continued and completed visit with audio only.  Some vital signs may be absent or patient reported.   Chriss Driver, LPN  Review of Systems     Cardiac Risk Factors include: advanced age (>87men, >10 women);diabetes mellitus;hypertension;dyslipidemia;sedentary lifestyle;obesity (BMI >30kg/m2)     Objective:    Today's Vitals   10/16/21 0855 10/16/21 0856  Weight: 216 lb (98 kg)   Height: 5\' 4"  (1.626 m)   PainSc:  2    Body mass index is 37.08 kg/m.  Advanced Directives 10/16/2021 12/06/2017 10/16/2017 10/14/2017 04/18/2016 09/13/2014  Does Patient Have a Medical Advance Directive? Yes Yes Yes Yes Yes No  Type of Paramedic of [place];Living will [place];Living will [place];Living will [place];Living will [place];Living will -  Does patient want to make changes to medical advance directive? - No - Patient declined No - Patient declined - - -  Copy of [place] in Chart? No - copy requested Yes - - No - copy  requested -  Would patient like information on creating a medical advance directive? - - - - - Yes - Educational materials given    Current Medications (verified) Outpatient Encounter Medications as of 10/16/2021  Medication Sig   acetaminophen (TYLENOL) 500 MG tablet Take 500 mg 2 (two) times daily as needed by mouth for moderate pain or headache.    Apoaequorin (PREVAGEN PO) Take by mouth.   aspirin 81 MG EC tablet Take 1 tablet (81 mg total) by mouth daily. (Patient taking differently: Take 81 mg by mouth in the morning and at bedtime.)   atorvastatin (LIPITOR) 10 MG tablet Take 1 tablet (10 mg total) by mouth daily.   beta carotene w/minerals (OCUVITE) tablet Take 1 tablet by mouth daily.   cholecalciferol (VITAMIN D) 1000 UNITS tablet Take 1,000 Units by mouth daily.   ferrous sulfate 324 MG TBEC 1 tablet   fluticasone (FLONASE) 50 MCG/ACT nasal spray    hydrochlorothiazide (HYDRODIURIL) 25 MG tablet Take 1 tablet (25 mg total) by mouth daily.   LANTUS 100 UNIT/ML injection Inject 35 Units into the skin at bedtime.    lisinopril (ZESTRIL) 20 MG tablet Take 1 tablet (20 mg total) by mouth daily.   LORazepam (ATIVAN) 0.5 MG tablet Take 1 tablet (0.5 mg total) by mouth 2 (two) times daily as needed for anxiety.   Magnesium 250 MG TABS Take 250 mg by mouth daily.    metFORMIN (GLUCOPHAGE-XR) 500 MG 24 hr tablet Take 1 tablet (500 mg total) by mouth 2 (two) times daily.   metoprolol tartrate (LOPRESSOR) 25 MG tablet Take 0.5 tablets (12.5  mg total) by mouth 2 (two) times daily.   Multiple Vitamins-Minerals (MULTI-BETIC DIABETES) TABS Take 1 tablet daily by mouth.   pyridOXINE (VITAMIN B-6) 100 MG tablet Take 100 mg by mouth daily.   Teriparatide, Recombinant, (FORTEO [place]) Inject into the skin.   vitamin C (ASCORBIC ACID) 500 MG tablet Take 500 mg by mouth daily.   [DISCONTINUED] calcium-vitamin D (OSCAL 500/200 D-3) 500-200 MG-UNIT tablet Take 1 tablet by mouth 2 (two) times daily.    [DISCONTINUED] fluticasone (FLONASE) 50 MCG/ACT nasal spray Place 1 spray into both nostrils daily as needed for allergies or rhinitis.   [DISCONTINUED] HUMALOG 100 UNIT/ML injection Inject 6-20 Units into the skin 3 (three) times daily with meals. Using sliding scale.   calcium citrate (CALCITRATE - DOSED IN MG ELEMENTAL CALCIUM) 950 (200 Ca) MG tablet 1 tablet   NOVOLOG 100 UNIT/ML injection Inject into the skin.   TRUETRACK TEST test strip Test four times daily. (Patient not taking: Reported on 10/16/2021)   No facility-administered encounter medications on file as of 10/16/2021.    Allergies (verified) Sulfa antibiotics, Alendronate, and Sulfamethoxazole-trimethoprim   History: Past Medical History:  Diagnosis Date   Aortic stenosis, severe 2015   Cataract    Diabetes mellitus without complication ([place])    Dyspnea    Hypertension    Obesity    Osteoporosis    S/P minimally invasive aortic valve replacement with bioprosthetic valve 10/16/2017   23 mm Edwards Intuity Elite rapid deployment bovine stented bioprosthetic tissue valve via partial upper sternotomy   Squamous cell carcinoma of skin 03/30/2020   KA on left thigh, anterior(CX35FU)   Past Surgical History:  Procedure Laterality Date   APPENDECTOMY     FEMUR FRACTURE SURGERY  07/30/2020   FRACTURE SURGERY     shoulder   TEE WITHOUT CARDIOVERSION N/A 04/18/2016   Procedure: TRANSESOPHAGEAL ECHOCARDIOGRAM (TEE);  Surgeon: Thayer Headings, MD;  Location: [place];  Service: Cardiovascular;  Laterality: N/A;   TEE WITHOUT CARDIOVERSION N/A 10/16/2017   Procedure: TRANSESOPHAGEAL ECHOCARDIOGRAM (TEE);  Surgeon: Rexene Alberts, MD;  Location: [place];  Service: Open Heart Surgery;  Laterality: N/A;   TOTAL ABDOMINAL HYSTERECTOMY     WRIST SURGERY     Family History  Problem Relation Age of Onset   Stroke Father 67   Diabetes Father    Retinopathy of prematurity Father    Heart failure Mother    Heart attack  Maternal Grandmother    Heart attack Paternal Grandmother    Diabetes Paternal Aunt    Diabetes Paternal Uncle    Social History   Socioeconomic History   Marital status: Married    Spouse name: John   Number of children: 2   Years of education: Not on file   Highest education level: Not on file  Occupational History   Not on file  Tobacco Use   Smoking status: Never   Smokeless tobacco: Never  Substance and Sexual Activity   Alcohol use: No    Alcohol/week: 0.0 standard drinks   Drug use: No   Sexual activity: Not on file  Other Topics Concern   Not on file  Social History Narrative   Lives at home with husband. Married x 56 years in 2022.   3 grandchildren.   Social Determinants of Health   Financial Resource Strain: Low Risk    Difficulty of Paying Living Expenses: Not hard at all  Food Insecurity: No Food Insecurity   Worried About Running Out of  Food in the Last Year: Never true   [place] in the Last Year: Never true  Transportation Needs: No Transportation Needs   Lack of Transportation (Medical): No   Lack of Transportation (Non-Medical): No  Physical Activity: Insufficiently Active   Days of Exercise per Week: 4 days   Minutes of Exercise per Session: 30 min  Stress: No Stress Concern Present   Feeling of Stress : Not at all  Social Connections: Socially Integrated   Frequency of Communication with Friends and Family: More than three times a week   Frequency of Social Gatherings with Friends and Family: More than three times a week   Attends Religious Services: More than 4 times per year   Active Member of [employer] or Organizations: Yes   Attends Music therapist: More than 4 times per year   Marital Status: Married    Tobacco Counseling Counseling given: Not Answered   Clinical Intake:  Pre-visit preparation completed: Yes  Pain : 0-10 Pain Score: 2  Pain Type: Chronic pain Pain Location: Leg Pain Descriptors / Indicators:  Aching Pain Onset: More than a month ago Pain Frequency: Intermittent     BMI - recorded: 37.08 Nutritional Status: BMI > 30  Obese Nutritional Risks: None Diabetes: Yes  How often do you need to have someone help you when you read instructions, pamphlets, or other written materials from your doctor or pharmacy?: 1 - Never  Diabetic?Nutrition Risk Assessment:  Has the patient had any N/V/D within the last 2 months?  No  Does the patient have any non-healing wounds?  No  Has the patient had any unintentional weight loss or weight gain?  No   Diabetes:  Is the patient diabetic?  Yes  If diabetic, was a CBG obtained today?  No  Did the patient bring in their glucometer from home?  No  How often do you monitor your CBG's? QID.   Financial Strains and Diabetes Management:  Are you having any financial strains with the device, your supplies or your medication? No .  Does the patient want to be seen by Chronic Care Management for management of their diabetes?  No  Would the patient like to be referred to a Nutritionist or for Diabetic Management?  No   Diabetic Exams:  Diabetic Eye Exam: Completed 2022. Pt has been advised about the importance in completing this exam.  Diabetic Foot Exam: Completed 10/06/2021.[name] Pt has been advised about the importance in completing this exam.Interpreter Needed?: No  Information entered by :: MJ Lenora Gomes, LPN   Activities of Daily Living In your present state of health, do you have any difficulty performing the following activities: 10/16/2021  Hearing? N  Vision? N  Difficulty concentrating or making decisions? N  Walking or climbing stairs? N  Dressing or bathing? N  Doing errands, shopping? N  Preparing Food and eating ? N  Using the Toilet? N  In the past six months, have you accidently leaked urine? N  Do you have problems with loss of bowel control? N  Managing your Medications? N  Managing your Finances? N  Housekeeping or managing  your Housekeeping? N  Some recent data might be hidden    Patient Care Team: [name], FNP as PCP - General (Nurse Practitioner) Jacelyn Pi, MD as Consulting Physician (Endocrinology) Lavonna Monarch, MD as Consulting Physician (Dermatology)  Indicate any recent Medical Services you may have received from other than [facility] providers in the past year (date may  be approximate).     Assessment:   This is a routine wellness examination for [facility].  Hearing/Vision screen Hearing Screening - Comments:: No hearing issues.  Vision Screening - Comments:: Glasses. [facility] [place]. 2022.  Dietary issues and exercise activities discussed: Current Exercise Habits: Home exercise routine, Type of exercise: walking, Time (Minutes): 30, Frequency (Times/Week): 4, Weekly Exercise (Minutes/Week): 120, Intensity: Mild, Exercise limited by: cardiac condition(s)   Goals Addressed             This Visit's Progress    Exercise 3x per week (30 min per time)       Continue to exercise and eat healthy.       Depression Screen PHQ 2/9 Scores 10/16/2021 10/06/2021 04/08/2020 03/07/2020 10/09/2019 04/09/2019 10/13/2018  PHQ - 2 Score 0 0 0 0 0 0 0  PHQ- 9 Score - 0 - - - - -  Exception Documentation - - - - - - -    Fall Risk Fall Risk  10/16/2021 10/06/2021 04/04/2021 10/11/2020 04/08/2020  Falls in the past year? 0 0 1 1 0  Number falls in past yr: 0 - 0 0 -  Comment - - - - -  Injury with Fall? 0 - 1 1 -  Risk for fall due to : History of fall(s);Impaired mobility;Impaired balance/gait - History of fall(s) History of fall(s) -  Follow up Falls prevention discussed - Education provided Education provided -    FALL RISK PREVENTION PERTAINING TO THE HOME:  Any stairs in or around the home? Yes  If so, are there any without handrails? No  Home free of loose throw rugs in walkways, pet beds, electrical cords, etc? No  Adequate lighting in your home to reduce risk of falls? No    ASSISTIVE DEVICES UTILIZED TO PREVENT FALLS:  Life alert? No  Use of a cane, walker or w/c? Yes  Grab bars in the bathroom? Yes  Shower chair or bench in shower? Yes  Elevated toilet seat or a handicapped toilet? Yes   TIMED UP AND GO:  Was the test performed? No . Phone visit.   Cognitive Function:     6CIT Screen 10/16/2021  What Year? 0 points  What month? 0 points  What time? 0 points  Count back from 20 0 points  Months in reverse 0 points  Repeat phrase 2 points  Total Score 2    Immunizations Immunization History  Administered Date(s) Administered   Fluad Quad(high Dose 65+) 08/17/2019, 09/23/2020, 08/23/2021   Influenza, High Dose Seasonal PF 08/16/2011, 08/19/2012, 08/21/2013, 09/08/2014, 10/01/2016, 09/24/2017, 09/02/2018, 08/17/2019, 09/23/2020   Moderna Sars-Covid-2 Vaccination 01/18/2020, 02/15/2020, 10/03/2020   Pneumococcal Conjugate-13 09/13/2014   Pneumococcal Polysaccharide-23 09/07/2013   Zoster, Live 10/15/2014    TDAP status: Due, Education has been provided regarding the importance of this vaccine. Advised may receive this vaccine at local pharmacy or Health Dept. Aware to provide a copy of the vaccination record if obtained from local pharmacy or Health Dept. Verbalized acceptance and understanding.  Flu Vaccine status: Up to date  Pneumococcal vaccine status: Up to date  Covid-19 vaccine status: Information provided on how to obtain vaccines.   Qualifies for Shingles Vaccine? Yes   Zostavax completed Yes   Shingrix Completed?: No.    Education has been provided regarding the importance of this vaccine. Patient has been advised to call insurance company to determine out of pocket expense if they have not yet received this vaccine. Advised may also receive vaccine at local  pharmacy or Health Dept. Verbalized acceptance and understanding.  Screening Tests Health Maintenance  Topic Date Due   PAP SMEAR-Modifier  08/26/2014   TETANUS/TDAP   06/19/2017   OPHTHALMOLOGY EXAM  08/06/2017   COVID-19 Vaccine (4 - Booster for Moderna series) 10/22/2021 (Originally 11/28/2020)   Zoster Vaccines- Shingrix (1 of 2) 01/06/2022 (Originally 06/26/1965)   LIPID PANEL  01/06/2022   HEMOGLOBIN A1C  04/05/2022   FOOT EXAM  10/06/2022   Pneumonia Vaccine 70+ Years old  Completed   INFLUENZA VACCINE  Completed   DEXA SCAN  Completed   Hepatitis C Screening  Completed   HPV VACCINES  Aged Out    Health Maintenance  Health Maintenance Due  Topic Date Due   PAP SMEAR-Modifier  08/26/2014   TETANUS/TDAP  06/19/2017   OPHTHALMOLOGY EXAM  08/06/2017    Colorectal cancer screening: Type of screening: FOBT/FIT. Completed 05/13/2018. Repeat every 3 years  Mammogram status: Completed 05/19/2021. Repeat every year  Bone Density status: Completed 07/2020 per pt. Results reflect: Bone density results: OSTEOPOROSIS. Repeat every 2 years.  Lung Cancer Screening: (Low Dose CT Chest recommended if Age 71-80 years, 30 pack-year currently smoking OR have quit w/in 15years.) does not qualify.  Non smoker.  Additional Screening:  Hepatitis C Screening: does qualify; Completed 04/02/2016  Vision Screening: Recommended annual ophthalmology exams for early detection of glaucoma and other disorders of the eye. Is the patient up to date with their annual eye exam?  Yes  Who is the provider or what is the name of the office in which the patient attends annual eye exams? [facility] If pt is not [place] with a provider, would they like to be referred to a provider to establish care? No .   Dental Screening: Recommended annual dental exams for proper oral hygiene  Community Resource Referral / Chronic Care Management: CRR required this visit?  No   CCM required this visit?  No      Plan:     I have personally reviewed and noted the following in the patient's chart:   Medical and social history Use of alcohol, tobacco or illicit drugs  Current  medications and supplements including opioid prescriptions. Patient is not currently taking opioid prescriptions. Functional ability and status Nutritional status Physical activity Advanced directives List of other physicians Hospitalizations, surgeries, and ER visits in previous 12 months Vitals Screenings to include cognitive, depression, and falls Referrals and appointments  In addition, I have reviewed and discussed with patient certain preventive protocols, quality metrics, and best practice recommendations. A written personalized care plan for preventive services as well as general preventive health recommendations were provided to patient.     Chriss Driver, LPN   [id number]/95/[id number]   Nurse Notes: Phone visit. Up to date on health maintenance. Discussed Shingles vaccine and how to obtain. Pt states she had a Dexa after her fall in 2021.

## 2021-11-01 DIAGNOSIS — E1165 Type 2 diabetes mellitus with hyperglycemia: Secondary | ICD-10-CM | POA: Diagnosis not present

## 2021-11-29 DIAGNOSIS — S7221XG Displaced subtrochanteric fracture of right femur, subsequent encounter for closed fracture with delayed healing: Secondary | ICD-10-CM | POA: Diagnosis not present

## 2021-12-02 DIAGNOSIS — E1165 Type 2 diabetes mellitus with hyperglycemia: Secondary | ICD-10-CM | POA: Diagnosis not present

## 2021-12-04 NOTE — Progress Notes (Signed)
Cardiology Office Note   Date:  12/06/2021   ID:  Toni Cooper, DOB [birth year], MRN [id number]  PCP:  Toni Pretty, FNP  Cardiologist:   None   Chief Complaint  Patient presents with   AVR      History of Present Illness: Toni Cooper is a 76 y.o. female who presents for follow up after SAVR.   Since I last saw her she has done well.  She is finally recovered from surgery for her femur fractures.  She gets around slowly with a cane.  She has not yet started doing a lot of physical activity as she just had wound healing and was released from the orthopedist. The patient denies any new symptoms such as chest discomfort, neck or arm discomfort. There has been no new shortness of breath, PND or orthopnea. There have been no reported palpitations, presyncope or syncope.   Past Medical History:  Diagnosis Date   Aortic stenosis, severe 2015   Cataract    Diabetes mellitus without complication ([place])    Dyspnea    Hypertension    Obesity    Osteoporosis    S/P minimally invasive aortic valve replacement with bioprosthetic valve 10/16/2017   23 mm Edwards Intuity Elite rapid deployment bovine stented bioprosthetic tissue valve via partial upper sternotomy   Squamous cell carcinoma of skin 03/30/2020   KA on left thigh, anterior(CX35FU)    Past Surgical History:  Procedure Laterality Date   APPENDECTOMY     FEMUR FRACTURE SURGERY  07/30/2020   FRACTURE SURGERY     shoulder   TEE WITHOUT CARDIOVERSION N/A 04/18/2016   Procedure: TRANSESOPHAGEAL ECHOCARDIOGRAM (TEE);  Surgeon: Toni Headings, Cooper;  Location: [place];  Service: Cardiovascular;  Laterality: N/A;   TEE WITHOUT CARDIOVERSION N/A 10/16/2017   Procedure: TRANSESOPHAGEAL ECHOCARDIOGRAM (TEE);  Surgeon: Toni Alberts, Cooper;  Location: [place];  Service: Open Heart Surgery;  Laterality: N/A;   TOTAL ABDOMINAL HYSTERECTOMY     WRIST SURGERY       Current Outpatient Medications  Medication Sig Dispense Refill    acetaminophen (TYLENOL) 500 MG tablet Take 500 mg 2 (two) times daily as needed by mouth for moderate pain or headache.      Apoaequorin (PREVAGEN PO) Take by mouth.     aspirin 81 MG EC tablet Take 1 tablet (81 mg total) by mouth daily. (Patient taking differently: Take 81 mg by mouth in the morning and at bedtime.)     atorvastatin (LIPITOR) 10 MG tablet Take 1 tablet (10 mg total) by mouth daily. 90 tablet 1   beta carotene w/minerals (OCUVITE) tablet Take 1 tablet by mouth daily.     calcium citrate (CALCITRATE - DOSED IN MG ELEMENTAL CALCIUM) 950 (200 Ca) MG tablet 1 tablet     cholecalciferol (VITAMIN D) 1000 UNITS tablet Take 1,000 Units by mouth daily.     ferrous sulfate 324 MG TBEC 1 tablet     fluticasone (FLONASE) 50 MCG/ACT nasal spray      hydrochlorothiazide (HYDRODIURIL) 25 MG tablet Take 1 tablet (25 mg total) by mouth daily. 90 tablet 1   LANTUS 100 UNIT/ML injection Inject 35 Units into the skin at bedtime.      lisinopril (ZESTRIL) 20 MG tablet Take 1 tablet (20 mg total) by mouth daily. 90 tablet 1   LORazepam (ATIVAN) 0.5 MG tablet Take 1 tablet (0.5 mg total) by mouth 2 (two) times daily as needed for anxiety. 30 tablet 1  Magnesium 250 MG TABS Take 250 mg by mouth daily.      metFORMIN (GLUCOPHAGE-XR) 500 MG 24 hr tablet Take 1 tablet (500 mg total) by mouth 2 (two) times daily. 180 tablet 1   metoprolol tartrate (LOPRESSOR) 25 MG tablet Take 0.5 tablets (12.5 mg total) by mouth 2 (two) times daily. 90 tablet 1   Multiple Vitamins-Minerals (MULTI-BETIC DIABETES) TABS Take 1 tablet daily by mouth.     NOVOLOG 100 UNIT/ML injection Inject into the skin.     pyridOXINE (VITAMIN B-6) 100 MG tablet Take 100 mg by mouth daily.     Teriparatide, Recombinant, (FORTEO [place]) Inject into the skin.     vitamin C (ASCORBIC ACID) 500 MG tablet Take 500 mg by mouth daily.     TRUETRACK TEST test strip Test four times daily. (Patient not taking: Reported on 10/16/2021)     No current  facility-administered medications for this visit.    Allergies:   Sulfa antibiotics, Alendronate, and Sulfamethoxazole-trimethoprim    ROS:  Please see the history of present illness.   Otherwise, review of systems are positive for none.   All other systems are reviewed and negative.    PHYSICAL EXAM: VS:  BP 140/70 (BP Location: Left Arm, Patient Position: Sitting, Cuff Size: Normal)    Pulse 77    Ht 5\' 4"  (1.626 m)    Wt 216 lb 12.8 oz (98.3 kg)    SpO2 98%    BMI 37.21 kg/m  , BMI Body mass index is 37.21 kg/m. GENERAL:  Well appearing NECK:  No jugular venous distention, waveform within normal limits, carotid upstroke brisk and symmetric, no bruits, no thyromegaly LUNGS:  Clear to auscultation bilaterally CHEST:  Unremarkable HEART:  PMI not displaced or sustained,S1 and S2 within normal limits, no S3, no S4, no clicks, no rubs, very soft brief systolic murmur radiating slightly at the aortic outflow tract, no diastolic murmurs ABD:  Flat, positive bowel sounds normal in frequency in pitch, no bruits, no rebound, no guarding, no midline pulsatile mass, no hepatomegaly, no splenomegaly EXT:  2 plus pulses throughout, no edema, no cyanosis no clubbing   EKG:  EKG is  ordered today. The ekg ordered today demonstrates sinus rhythm, 77, borderline interventricular conduction delay.  PVC   Recent Labs: 10/06/2021: ALT 15; BUN 22; Creatinine, Ser 0.82; Hemoglobin 10.7; Platelets 323; Potassium 4.4; Sodium 144    Lipid Panel    Component Value Date/Time   CHOL 109 10/06/2021 0800   TRIG 91 10/06/2021 0800   TRIG 74 02/25/2014 0000   HDL 38 (L) 10/06/2021 0800   CHOLHDL 2.9 10/06/2021 0800   LDLCALC 53 10/06/2021 0800      Wt Readings from Last 3 Encounters:  12/06/21 216 lb 12.8 oz (98.3 kg)  10/16/21 216 lb (98 kg)  10/06/21 216 lb (98 kg)      Other studies Reviewed: Additional studies/ records that were reviewed today include: Labs. Review of the above records  demonstrates:  Please see elsewhere in the note.     ASSESSMENT AND PLAN:  SAVR:   The patient has no new cardiovascular complaints.  Her physical exam is unchanged.  I will likely repeat an echocardiogram next year when I see her.    OVERWEIGHT:   I continue to encourage weight loss.  Certainly this should be paying attention to some diet.  But I gave her specific instructions about getting back on her stationary bicycle and some goals for that.  DM:  A1C was 6.9.  No change in therapy.   HTN:    Blood pressure is at target although I would not want any higher than this.  No change in therapy.    Current medicines are reviewed at length with the patient today.  The patient does not have concerns regarding medicines.  The following changes have been made: None  Labs/ tests ordered today include: None  No orders of the defined types were placed in this encounter.    Disposition:   FU with me 1 year   Signed, [name], Cooper  12/06/2021 11:52 AM    [place]

## 2021-12-06 ENCOUNTER — Other Ambulatory Visit: Payer: Self-pay

## 2021-12-06 ENCOUNTER — Ambulatory Visit (INDEPENDENT_AMBULATORY_CARE_PROVIDER_SITE_OTHER): Payer: Medicare PPO | Admitting: Cardiology

## 2021-12-06 ENCOUNTER — Encounter: Payer: Self-pay | Admitting: Cardiology

## 2021-12-06 VITALS — BP 140/70 | HR 77 | Ht 64.0 in | Wt 216.8 lb

## 2021-12-06 DIAGNOSIS — I10 Essential (primary) hypertension: Secondary | ICD-10-CM

## 2021-12-06 DIAGNOSIS — Z952 Presence of prosthetic heart valve: Secondary | ICD-10-CM

## 2021-12-06 DIAGNOSIS — E118 Type 2 diabetes mellitus with unspecified complications: Secondary | ICD-10-CM

## 2021-12-06 NOTE — Patient Instructions (Signed)
Medication Instructions:  The current medical regimen is effective;  continue present plan and medications.  *If you need a refill on your cardiac medications before your next appointment, please call your pharmacy*  Follow-Up: At [facility], you and your health needs are our priority.  As part of our continuing mission to provide you with exceptional heart care, we have created designated Provider Care Teams.  These Care Teams include your primary Cardiologist (physician) and Advanced Practice Providers (APPs -  Physician Assistants and Nurse Practitioners) who all work together to provide you with the care you need, when you need it.  We recommend signing up for the patient portal called "MyChart".  Sign up information is provided on this After Visit Summary.  MyChart is used to connect with patients for Virtual Visits (Telemedicine).  Patients are able to view lab/test results, encounter notes, upcoming appointments, etc.  Non-urgent messages can be sent to your provider as well.   To learn more about what you can do with MyChart, go to [email or web address].    Your next appointment:   1 year(s)  The format for your next appointment:   In Person  Provider:   James Hochrein, MD   Thank you for choosing [facility]!!    

## 2021-12-15 DIAGNOSIS — M810 Age-related osteoporosis without current pathological fracture: Secondary | ICD-10-CM | POA: Diagnosis not present

## 2021-12-20 ENCOUNTER — Other Ambulatory Visit: Payer: Self-pay | Admitting: Nurse Practitioner

## 2021-12-20 DIAGNOSIS — E782 Mixed hyperlipidemia: Secondary | ICD-10-CM

## 2021-12-20 DIAGNOSIS — I10 Essential (primary) hypertension: Secondary | ICD-10-CM

## 2022-01-02 DIAGNOSIS — E1165 Type 2 diabetes mellitus with hyperglycemia: Secondary | ICD-10-CM | POA: Diagnosis not present

## 2022-01-30 DIAGNOSIS — E1165 Type 2 diabetes mellitus with hyperglycemia: Secondary | ICD-10-CM | POA: Diagnosis not present

## 2022-02-02 DIAGNOSIS — E1165 Type 2 diabetes mellitus with hyperglycemia: Secondary | ICD-10-CM | POA: Diagnosis not present

## 2022-03-02 DIAGNOSIS — E1165 Type 2 diabetes mellitus with hyperglycemia: Secondary | ICD-10-CM | POA: Diagnosis not present

## 2022-03-17 ENCOUNTER — Other Ambulatory Visit: Payer: Self-pay | Admitting: Nurse Practitioner

## 2022-03-17 DIAGNOSIS — Z953 Presence of xenogenic heart valve: Secondary | ICD-10-CM

## 2022-03-17 DIAGNOSIS — I10 Essential (primary) hypertension: Secondary | ICD-10-CM

## 2022-04-01 DIAGNOSIS — E1165 Type 2 diabetes mellitus with hyperglycemia: Secondary | ICD-10-CM | POA: Diagnosis not present

## 2022-04-05 ENCOUNTER — Ambulatory Visit: Payer: Medicare PPO | Admitting: Nurse Practitioner

## 2022-04-05 ENCOUNTER — Encounter: Payer: Self-pay | Admitting: Nurse Practitioner

## 2022-04-05 VITALS — BP 134/70 | HR 59 | Temp 98.0°F | Resp 20 | Ht 64.0 in | Wt 218.0 lb

## 2022-04-05 DIAGNOSIS — Z6838 Body mass index (BMI) 38.0-38.9, adult: Secondary | ICD-10-CM

## 2022-04-05 DIAGNOSIS — M810 Age-related osteoporosis without current pathological fracture: Secondary | ICD-10-CM | POA: Diagnosis not present

## 2022-04-05 DIAGNOSIS — Z953 Presence of xenogenic heart valve: Secondary | ICD-10-CM

## 2022-04-05 DIAGNOSIS — I10 Essential (primary) hypertension: Secondary | ICD-10-CM | POA: Diagnosis not present

## 2022-04-05 DIAGNOSIS — F419 Anxiety disorder, unspecified: Secondary | ICD-10-CM | POA: Diagnosis not present

## 2022-04-05 DIAGNOSIS — E782 Mixed hyperlipidemia: Secondary | ICD-10-CM

## 2022-04-05 DIAGNOSIS — E119 Type 2 diabetes mellitus without complications: Secondary | ICD-10-CM | POA: Diagnosis not present

## 2022-04-05 DIAGNOSIS — S7221XG Displaced subtrochanteric fracture of right femur, subsequent encounter for closed fracture with delayed healing: Secondary | ICD-10-CM | POA: Diagnosis not present

## 2022-04-05 DIAGNOSIS — G609 Hereditary and idiopathic neuropathy, unspecified: Secondary | ICD-10-CM | POA: Diagnosis not present

## 2022-04-05 DIAGNOSIS — D72829 Elevated white blood cell count, unspecified: Secondary | ICD-10-CM | POA: Diagnosis not present

## 2022-04-05 DIAGNOSIS — Z9181 History of falling: Secondary | ICD-10-CM | POA: Diagnosis not present

## 2022-04-05 LAB — BAYER DCA HB A1C WAIVED: HB A1C (BAYER DCA - WAIVED): 7 % — ABNORMAL HIGH (ref 4.8–5.6)

## 2022-04-05 MED ORDER — OZEMPIC (0.25 OR 0.5 MG/DOSE) 2 MG/1.5ML ~~LOC~~ SOPN: 0.5000 mg | PEN_INJECTOR | SUBCUTANEOUS | 1 refills | Status: DC

## 2022-04-05 MED ORDER — METOPROLOL TARTRATE 25 MG PO TABS: 12.5000 mg | ORAL_TABLET | Freq: Two times a day (BID) | ORAL | 1 refills | Status: DC

## 2022-04-05 MED ORDER — ATORVASTATIN CALCIUM 10 MG PO TABS: 10.0000 mg | ORAL_TABLET | Freq: Every day | ORAL | 1 refills | Status: DC

## 2022-04-05 MED ORDER — LISINOPRIL 20 MG PO TABS: 20.0000 mg | ORAL_TABLET | Freq: Every day | ORAL | 1 refills | Status: DC

## 2022-04-05 MED ORDER — METFORMIN HCL ER 500 MG PO TB24: 500.0000 mg | ORAL_TABLET | Freq: Two times a day (BID) | ORAL | 1 refills | Status: DC

## 2022-04-05 MED ORDER — HYDROCHLOROTHIAZIDE 25 MG PO TABS: 25.0000 mg | ORAL_TABLET | Freq: Every day | ORAL | 1 refills | Status: DC

## 2022-04-05 MED ORDER — LORAZEPAM 0.5 MG PO TABS: 0.5000 mg | ORAL_TABLET | Freq: Two times a day (BID) | ORAL | 1 refills | Status: DC | PRN

## 2022-04-05 NOTE — Patient Instructions (Signed)
Bone Health Bones protect organs, store calcium, anchor muscles, and support the whole body. Keeping your bones strong is important, especially as you get older. You can take actions to help keep your bones strong and healthy. Why is keeping my bones healthy important?  Keeping your bones healthy is important because your body constantly replaces bone cells. Cells get old, and new cells take their place. As we age, we lose bone cells because the body may not be able to make enough new cells to replace the old cells. The amount of bone cells and bone tissue you have is referred to as bone mass. The higher your bone mass, the stronger your bones. The aging process leads to an overall loss of bone mass in the body, which can increase the likelihood of: Broken bones. A condition in which the bones become weak and brittle (osteoporosis). A large decline in bone mass occurs in older adults. In women, it occurs about the time of menopause. What actions can I take to keep my bones healthy? Good health habits are important for maintaining healthy bones. This includes eating nutritious foods and exercising regularly. To have healthy bones, you need to get enough of the right minerals and vitamins. Most nutrition experts recommend getting these nutrients from the foods that you eat. In some cases, taking supplements may also be recommended. Doing certain types of exercise is also important for bone health. What are the nutritional recommendations for healthy bones?  Eating a well-balanced diet with plenty of calcium and vitamin D will help to protect your bones. Nutritional recommendations vary from person to person. Ask your health care provider what is healthy for you. Here are some general guidelines. Get enough calcium Calcium is the most important (essential) mineral for bone health. Most people can get enough calcium from their diet, but supplements may be recommended for people who are at risk for  osteoporosis. Good sources of calcium include: Dairy products, such as low-fat or nonfat milk, cheese, and yogurt. Dark green leafy vegetables, such as bok choy and broccoli. Foods that have calcium added to them (are fortified). Foods that may be fortified with calcium include orange juice, cereal, bread, soy beverages, and tofu products. Nuts, such as almonds. Follow these recommended amounts for daily calcium intake: Infants, 0-6 months: 200 mg. Infants, 6-12 months: 260 mg. Children, age 1-3: 700 mg. Children, age 4-8: 1,000 mg. Children, age 9-13: 1,300 mg. Teens, age 14-18: 1,300 mg. Adults, age 19-50: 1,000 mg. Adults, age 51-70: Men: 1,000 mg. Women: 1,200 mg. Adults, age 71 or older: 1,200 mg. Pregnant and breastfeeding females: Teens: 1,300 mg. Adults: 1,000 mg. Get enough vitamin D Vitamin D is the most essential vitamin for bone health. It helps the body absorb calcium. Sunlight stimulates the skin to make vitamin D, so be sure to get enough sunlight. If you live in a cold climate or you do not get outside often, your health care provider may recommend that you take vitamin D supplements. Good sources of vitamin D in your diet include: Egg yolks. Saltwater fish. Milk and cereal fortified with vitamin D. Follow these recommended amounts for daily vitamin D intake: Infants, 0-12 months: 400 international units (IU). Children and teens, age 1-18: 600 international units. Adults, age 59 or younger: 600 international units. Adults, age 60 or older: 600-1,000 international units. Get other important nutrients Other nutrients that are important for bone health include: Phosphorus. This mineral is found in meat, poultry, dairy foods, nuts, and legumes. The   recommended daily intake for adult men and adult women is 700 mg. Magnesium. This mineral is found in seeds, nuts, dark green vegetables, and legumes. The recommended daily intake for adult men is 400-420 mg. For adult women,  it is 310-320 mg. Vitamin K. This vitamin is found in green leafy vegetables. The recommended daily intake is 120 mcg for adult men and 90 mcg for adult women. What type of physical activity is best for building and maintaining healthy bones? Weight-bearing and strength-building activities are important for building and maintaining healthy bones. Weight-bearing activities cause muscles and bones to work against gravity. Strength-building activities increase the strength of the muscles that support bones. Weight-bearing and muscle-building activities include: Walking and hiking. Jogging and running. Dancing. Gym exercises. Lifting weights. Tennis and racquetball. Climbing stairs. Aerobics. Adults should get at least 30 minutes of moderate physical activity on most days. Children should get at least 60 minutes of moderate physical activity on most days. Ask your health care provider what type of exercise is best for you. How can I find out if my bone mass is low? Bone mass can be measured with an X-ray test called a bone mineral density (BMD) test. This test is recommended for all women who are age 65 or older. It may also be recommended for: Men who are age 70 or older. People who are at risk for osteoporosis because of: Having a long-term disease that weakens bones, such as kidney disease or rheumatoid arthritis. Having menopause earlier than normal. Taking medicine that weakens bones, such as steroids, thyroid hormones, or hormone treatment for breast cancer or prostate cancer. Smoking. Drinking three or more alcoholic drinks a day. Being underweight. Sedentary lifestyle. If you find that you have a low bone mass, you may be able to prevent osteoporosis or further bone loss by changing your diet and lifestyle. Where can I find more information? Bone Health & Osteoporosis Foundation: www.nof.org/patients National Institutes of Health: www.bones.nih.gov International Osteoporosis  Foundation: www.iofbonehealth.org Summary The aging process leads to an overall loss of bone mass in the body, which can increase the likelihood of broken bones and osteoporosis. Eating a well-balanced diet with plenty of calcium and vitamin D will help to protect your bones. Weight-bearing and strength-building activities are also important for building and maintaining strong bones. Bone mass can be measured with an X-ray test called a bone mineral density (BMD) test. This information is not intended to replace advice given to you by your health care provider. Make sure you discuss any questions you have with your health care provider. Document Revised: 04/19/2021 Document Reviewed: 04/19/2021 Elsevier Patient Education  2023 Elsevier Inc.  

## 2022-04-05 NOTE — Progress Notes (Signed)
Subjective:    Patient ID: Toni Cooper, female    DOB: 1946-07-05, 76 y.o.   MRN: [id number]   Chief Complaint: medical management of chronic issues    HPI:  Toni Cooper is a 76 y.o. who identifies as a female who was assigned female at birth.   Social history: Lives with: husband Work history: retired   Scientist, forensic in today for follow up of the following chronic medical issues:  1. Primary hypertension No c/o chest pain, sob or headache. Does not check blood pressure at home very often. BP Readings from Last 3 Encounters:  12/06/21 140/70  10/06/21 140/71  08/02/21 (!) 138/55     2. Type 2 diabetes mellitus without complication, without long-term current use of insulin (HCC) Fasting blood sugar are up an down. She does not watch her diet as closely as she should. Lab Results  Component Value Date   HGBA1C 6.9 (H) 10/06/2021     3. Mixed hyperlipidemia Does try to watch diet but does no exercise. Lab Results  Component Value Date   CHOL 109 10/06/2021   HDL 38 (L) 10/06/2021   LDLCALC 53 10/06/2021   TRIG 91 10/06/2021   CHOLHDL 2.9 10/06/2021     4. S/P minimally invasive aortic valve replacement with bioprosthetic valve Is doing well. Sees cardiology yearly. Last cardiology appointmnet was 12/06/21 with no change to plan of care.  5. Leukocytosis, unspecified type Lab Results  Component Value Date   WBC 7.9 10/06/2021   HGB 10.7 (L) 10/06/2021   HCT 34.9 10/06/2021   MCV 88 10/06/2021   PLT 323 10/06/2021     6. Age-related osteoporosis without current pathological fracture Last dexascan was done on 11/1016. She doe snot want to do anymore. She had a bad reaction to bisphosphonate, so there is nothing she can do anyway since she cannot exercise much.she has been doing forteo.  7. Closed displaced subtrochanteric fracture of right femur with delayed healing Fracture has finally jealed but she is still scared when she is walking that she may fall. Is walking  with a cane.  8. At risk for falls [place] and broke her femur over a year ago.  9. Hereditary and idiopathic neuropathy, unspecified Has some numbness and tingling in her feet ocaasionally.   10.  Anxiety Has ativan availbale to take but very seldom takes it.    04/05/2022    8:09 AM 10/06/2021    8:12 AM 04/08/2020    8:16 AM 10/09/2019    8:19 AM  GAD 7 : Generalized Anxiety Score  Nervous, Anxious, on Edge 0 0 0 0  Control/stop worrying 0 0 0 0  Worry too much - different things 0 0 0 0  Trouble relaxing 0 0 0 0  Restless 0 0 0 0  Easily annoyed or irritable 0 0 0 0  Afraid - awful might happen 0 0 0 0  Total GAD 7 Score 0 0 0 0  Anxiety Difficulty Not difficult at all Not difficult at all  Not difficult at all       11. Class 2 severe obesity due to excess calories with serious comorbidity and body mass index (BMI) of 38.0 to 38.9 in adult [facility]) No recent weight changes. Wt Readings from Last 3 Encounters:  04/05/22 218 lb (98.9 kg)  12/06/21 216 lb 12.8 oz (98.3 kg)  10/16/21 216 lb (98 kg)   BMI Readings from Last 3 Encounters:  04/05/22 37.42 kg/m  12/06/21  37.21 kg/m  10/16/21 37.08 kg/m     New complaints: None today  Allergies  Allergen Reactions   Sulfa Antibiotics Nausea And Vomiting   Alendronate Other (See Comments)   Sulfamethoxazole-Trimethoprim Other (See Comments)   Outpatient Encounter Medications as of 04/05/2022  Medication Sig   acetaminophen (TYLENOL) 500 MG tablet Take 500 mg 2 (two) times daily as needed by mouth for moderate pain or headache.    Apoaequorin (PREVAGEN PO) Take by mouth.   aspirin 81 MG EC tablet Take 1 tablet (81 mg total) by mouth daily. (Patient taking differently: Take 81 mg by mouth in the morning and at bedtime.)   atorvastatin (LIPITOR) 10 MG tablet TAKE 1 TABLET (10 MG TOTAL) BY MOUTH DAILY.   beta carotene w/minerals (OCUVITE) tablet Take 1 tablet by mouth daily.   calcium citrate (CALCITRATE - DOSED IN MG  ELEMENTAL CALCIUM) 950 (200 Ca) MG tablet 1 tablet   cholecalciferol (VITAMIN D) 1000 UNITS tablet Take 1,000 Units by mouth daily.   ferrous sulfate 324 MG TBEC 1 tablet   fluticasone (FLONASE) 50 MCG/ACT nasal spray    hydrochlorothiazide (HYDRODIURIL) 25 MG tablet TAKE 1 TABLET (25 MG TOTAL) BY MOUTH DAILY.   LANTUS 100 UNIT/ML injection Inject 35 Units into the skin at bedtime.    lisinopril (ZESTRIL) 20 MG tablet TAKE 1 TABLET (20 MG TOTAL) BY MOUTH DAILY.   LORazepam (ATIVAN) 0.5 MG tablet Take 1 tablet (0.5 mg total) by mouth 2 (two) times daily as needed for anxiety.   Magnesium 250 MG TABS Take 250 mg by mouth daily.    metFORMIN (GLUCOPHAGE-XR) 500 MG 24 hr tablet Take 1 tablet (500 mg total) by mouth 2 (two) times daily.   metoprolol tartrate (LOPRESSOR) 25 MG tablet TAKE 1/2 TABLET TWICE DAILY   Multiple Vitamins-Minerals (MULTI-BETIC DIABETES) TABS Take 1 tablet daily by mouth.   NOVOLOG 100 UNIT/ML injection Inject into the skin.   pyridOXINE (VITAMIN B-6) 100 MG tablet Take 100 mg by mouth daily.   Teriparatide, Recombinant, (FORTEO [place]) Inject into the skin.   TRUETRACK TEST test strip Test four times daily. (Patient not taking: Reported on 10/16/2021)   vitamin C (ASCORBIC ACID) 500 MG tablet Take 500 mg by mouth daily.   No facility-administered encounter medications on file as of 04/05/2022.    Past Surgical History:  Procedure Laterality Date   APPENDECTOMY     FEMUR FRACTURE SURGERY  07/30/2020   FRACTURE SURGERY     shoulder   TEE WITHOUT CARDIOVERSION N/A 04/18/2016   Procedure: TRANSESOPHAGEAL ECHOCARDIOGRAM (TEE);  Surgeon: Vesta Mixer, MD;  Location: [facility] ENDOSCOPY;  Service: Cardiovascular;  Laterality: N/A;   TEE WITHOUT CARDIOVERSION N/A 10/16/2017   Procedure: TRANSESOPHAGEAL ECHOCARDIOGRAM (TEE);  Surgeon: Purcell Nails, MD;  [facility] OR;  Service: Open Heart Surgery;  Laterality: N/A;   TOTAL ABDOMINAL HYSTERECTOMY     WRIST SURGERY      Family  History  Problem Relation Age of Onset   Stroke Father 51   Diabetes Father    Retinopathy of prematurity Father    Heart failure Mother    Heart attack Maternal Grandmother    Heart attack Paternal Grandmother    Diabetes Paternal Aunt    Diabetes Paternal Uncle       Controlled substance contract: 04/05/22-      Review of Systems  Constitutional:  Negative for diaphoresis.  Eyes:  Negative for pain.  Respiratory:  Negative for shortness of breath.  Cardiovascular:  Negative for chest pain, palpitations and leg swelling.  Gastrointestinal:  Negative for abdominal pain.  Endocrine: Negative for polydipsia.  Skin:  Negative for rash.  Neurological:  Negative for dizziness, weakness and headaches.  Hematological:  Does not bruise/bleed easily.  All other systems reviewed and are negative.     Objective:   Physical Exam Vitals and nursing note reviewed.  Constitutional:      General: She is not in acute distress.    Appearance: Normal appearance. She is well-developed.  HENT:     Head: Normocephalic.     Right Ear: Tympanic membrane normal.     Left Ear: Tympanic membrane normal.     Nose: Nose normal.     Mouth/Throat:     Mouth: Mucous membranes are moist.  Eyes:     Pupils: Pupils are equal, round, and reactive to light.  Neck:     Vascular: No carotid bruit or JVD.  Cardiovascular:     Rate and Rhythm: Normal rate and regular rhythm.     Heart sounds: Normal heart sounds.  Pulmonary:     Effort: Pulmonary effort is normal. No respiratory distress.     Breath sounds: Normal breath sounds. No wheezing or rales.  Chest:     Chest wall: No tenderness.  Abdominal:     General: Bowel sounds are normal. There is no distension or abdominal bruit.     Palpations: Abdomen is soft. There is no hepatomegaly, splenomegaly, mass or pulsatile mass.     Tenderness: There is no abdominal tenderness.  Musculoskeletal:        General: Normal range of motion.     Cervical  back: Normal range of motion and neck supple.  Lymphadenopathy:     Cervical: No cervical adenopathy.  Skin:    General: Skin is warm and dry.  Neurological:     Mental Status: She is alert and oriented to person, place, and time.     Deep Tendon Reflexes: Reflexes are normal and symmetric.  Psychiatric:        Behavior: Behavior normal.        Thought Content: Thought content normal.        Judgment: Judgment normal.   BP 134/70   Pulse (!) 59   Temp 98 F (36.7 C) (Temporal)   Resp 20   Ht 5\' 4"  (1.626 m)   Wt 218 lb (98.9 kg)   SpO2 99%   BMI 37.42 kg/m   Hgba1c 7.0%      Assessment & Plan:   Toni Cooper comes in today with chief complaint of Medical Management of Chronic Issues   Diagnosis and orders addressed:  1. Primary hypertension Low sodium diet - CBC with Differential/Platelet - CMP14+EGFR - hydrochlorothiazide (HYDRODIURIL) 25 MG tablet; Take 1 tablet (25 mg total) by mouth daily.  Dispense: 90 tablet; Refill: 1 - lisinopril (ZESTRIL) 20 MG tablet; Take 1 tablet (20 mg total) by mouth daily.  Dispense: 90 tablet; Refill: 1  2. Type 2 diabetes mellitus without complication, without long-term current use of insulin (HCC) Continue to watch carbs in diet - Bayer DCA Hb A1c Waived - Semaglutide,0.25 or 0.5MG /DOS, (OZEMPIC, 0.25 OR 0.5 MG/DOSE,) 2 MG/1.5ML SOPN; Inject 0.5 mg into the skin once a week.  Dispense: 4.5 mL; Refill: 1 - metFORMIN (GLUCOPHAGE-XR) 500 MG 24 hr tablet; Take 1 tablet (500 mg total) by mouth 2 (two) times daily.  Dispense: 180 tablet; Refill: 1  3. Mixed hyperlipidemia Low  fat diet - Lipid panel - atorvastatin (LIPITOR) 10 MG tablet; Take 1 tablet (10 mg total) by mouth daily.  Dispense: 90 tablet; Refill: 1  4. S/P minimally invasive aortic valve replacement with bioprosthetic valve Keep followup with cardiology - metoprolol tartrate (LOPRESSOR) 25 MG tablet; Take 0.5 tablets (12.5 mg total) by mouth 2 (two) times daily.  Dispense:  90 tablet; Refill: 1  5. Leukocytosis, unspecified type Labs pending  6. Age-related osteoporosis without current pathological fracture Continue at osteoporosis clinic  7. Closed displaced subtrochanteric fracture of right femur with delayed healing Continue to use cane when walking  8. At risk for falls Fall prevention  9. Hereditary and idiopathic neuropathy, unspecified Do not go bare footed  10. Class 2 severe obesity due to excess calories with serious comorbidity and body mass index (BMI) of 38.0 to 38.9 in adult [facility]) Discussed diet and exercise for person with BMI >25 Will recheck weight in 3-6 months   11. Anxiety Stress manaegment - Drug Screen 10 W/Conf, Se - LORazepam (ATIVAN) 0.5 MG tablet; Take 1 tablet (0.5 mg total) by mouth 2 (two) times daily as needed for anxiety.  Dispense: 30 tablet; Refill: 1   Labs pending Health Maintenance reviewed Diet and exercise encouraged  Follow up plan: 3 months   Mary-Margaret Hassell Done, FNP

## 2022-04-10 LAB — CMP14+EGFR
ALT: 20 IU/L (ref 0–32)
AST: 19 IU/L (ref 0–40)
Albumin/Globulin Ratio: 1.8 (ref 1.2–2.2)
Albumin: 4.4 g/dL (ref 3.7–4.7)
Alkaline Phosphatase: 94 IU/L (ref 44–121)
BUN/Creatinine Ratio: 28 (ref 12–28)
BUN: 24 mg/dL (ref 8–27)
Bilirubin Total: 0.4 mg/dL (ref 0.0–1.2)
CO2: 27 mmol/L (ref 20–29)
Calcium: 10.7 mg/dL — ABNORMAL HIGH (ref 8.7–10.3)
Chloride: 103 mmol/L (ref 96–106)
Creatinine, Ser: 0.87 mg/dL (ref 0.57–1.00)
Globulin, Total: 2.5 g/dL (ref 1.5–4.5)
Glucose: 95 mg/dL (ref 70–99)
Potassium: 4.1 mmol/L (ref 3.5–5.2)
Sodium: 143 mmol/L (ref 134–144)
Total Protein: 6.9 g/dL (ref 6.0–8.5)
eGFR: 69 mL/min/{1.73_m2} (ref >59)

## 2022-04-10 LAB — CBC WITH DIFFERENTIAL/PLATELET
Basophils Absolute: 0.1 10*3/uL (ref 0.0–0.2)
Basos: 1 %
EOS (ABSOLUTE): 0.2 10*3/uL (ref 0.0–0.4)
Eos: 2 %
Hematocrit: 41.9 % (ref 34.0–46.6)
Hemoglobin: 13.5 g/dL (ref 11.1–15.9)
Immature Grans (Abs): 0.1 10*3/uL (ref 0.0–0.1)
Immature Granulocytes: 1 %
Lymphocytes Absolute: 2.1 10*3/uL (ref 0.7–3.1)
Lymphs: 25 %
MCH: 28.9 pg (ref 26.6–33.0)
MCHC: 32.2 g/dL (ref 31.5–35.7)
MCV: 90 fL (ref 79–97)
Monocytes Absolute: 0.8 10*3/uL (ref 0.1–0.9)
Monocytes: 9 %
Neutrophils Absolute: 5.3 10*3/uL (ref 1.4–7.0)
Neutrophils: 62 %
Platelets: 277 10*3/uL (ref 150–450)
RBC: 4.67 x10E6/uL (ref 3.77–5.28)
RDW: 15.2 % (ref 11.7–15.4)
WBC: 8.4 10*3/uL (ref 3.4–10.8)

## 2022-04-10 LAB — DRUG SCREEN 10 W/CONF, SERUM
Amphetamines, IA: NEGATIVE ng/mL
Barbiturates, IA: NEGATIVE ug/mL
Benzodiazepines, IA: NEGATIVE ng/mL
Cocaine & Metabolite, IA: NEGATIVE ng/mL
Methadone, IA: NEGATIVE ng/mL
Opiates, IA: NEGATIVE ng/mL
Oxycodones, IA: NEGATIVE ng/mL
Phencyclidine, IA: NEGATIVE ng/mL
Propoxyphene, IA: NEGATIVE ng/mL
THC(Marijuana) Metabolite, IA: NEGATIVE ng/mL

## 2022-04-10 LAB — LIPID PANEL
Chol/HDL Ratio: 2.6 ratio (ref 0.0–4.4)
Cholesterol, Total: 110 mg/dL (ref 100–199)
HDL: 43 mg/dL (ref >39)
LDL Chol Calc (NIH): 48 mg/dL (ref 0–99)
Triglycerides: 103 mg/dL (ref 0–149)
VLDL Cholesterol Cal: 19 mg/dL (ref 5–40)

## 2022-05-02 DIAGNOSIS — E1165 Type 2 diabetes mellitus with hyperglycemia: Secondary | ICD-10-CM | POA: Diagnosis not present

## 2022-05-10 DIAGNOSIS — E119 Type 2 diabetes mellitus without complications: Secondary | ICD-10-CM | POA: Diagnosis not present

## 2022-05-10 DIAGNOSIS — H40033 Anatomical narrow angle, bilateral: Secondary | ICD-10-CM | POA: Diagnosis not present

## 2022-05-30 DIAGNOSIS — M899 Disorder of bone, unspecified: Secondary | ICD-10-CM | POA: Diagnosis not present

## 2022-05-30 DIAGNOSIS — E7800 Pure hypercholesterolemia, unspecified: Secondary | ICD-10-CM | POA: Diagnosis not present

## 2022-05-30 DIAGNOSIS — E1165 Type 2 diabetes mellitus with hyperglycemia: Secondary | ICD-10-CM | POA: Diagnosis not present

## 2022-05-30 DIAGNOSIS — G609 Hereditary and idiopathic neuropathy, unspecified: Secondary | ICD-10-CM | POA: Diagnosis not present

## 2022-05-30 DIAGNOSIS — I10 Essential (primary) hypertension: Secondary | ICD-10-CM | POA: Diagnosis not present

## 2022-05-30 DIAGNOSIS — E559 Vitamin D deficiency, unspecified: Secondary | ICD-10-CM | POA: Diagnosis not present

## 2022-06-01 DIAGNOSIS — E1165 Type 2 diabetes mellitus with hyperglycemia: Secondary | ICD-10-CM | POA: Diagnosis not present

## 2022-06-01 DIAGNOSIS — Z1231 Encounter for screening mammogram for malignant neoplasm of breast: Secondary | ICD-10-CM | POA: Diagnosis not present

## 2022-07-02 DIAGNOSIS — E1165 Type 2 diabetes mellitus with hyperglycemia: Secondary | ICD-10-CM | POA: Diagnosis not present

## 2022-07-09 ENCOUNTER — Ambulatory Visit: Payer: Medicare PPO | Admitting: Nurse Practitioner

## 2022-07-18 ENCOUNTER — Ambulatory Visit: Payer: Medicare PPO | Admitting: Dermatology

## 2022-08-02 DIAGNOSIS — E1165 Type 2 diabetes mellitus with hyperglycemia: Secondary | ICD-10-CM | POA: Diagnosis not present

## 2022-08-29 DIAGNOSIS — G609 Hereditary and idiopathic neuropathy, unspecified: Secondary | ICD-10-CM | POA: Diagnosis not present

## 2022-08-29 DIAGNOSIS — E1165 Type 2 diabetes mellitus with hyperglycemia: Secondary | ICD-10-CM | POA: Diagnosis not present

## 2022-08-29 DIAGNOSIS — E7800 Pure hypercholesterolemia, unspecified: Secondary | ICD-10-CM | POA: Diagnosis not present

## 2022-08-29 DIAGNOSIS — E559 Vitamin D deficiency, unspecified: Secondary | ICD-10-CM | POA: Diagnosis not present

## 2022-08-29 DIAGNOSIS — M899 Disorder of bone, unspecified: Secondary | ICD-10-CM | POA: Diagnosis not present

## 2022-08-29 DIAGNOSIS — I10 Essential (primary) hypertension: Secondary | ICD-10-CM | POA: Diagnosis not present

## 2022-09-01 DIAGNOSIS — E1165 Type 2 diabetes mellitus with hyperglycemia: Secondary | ICD-10-CM | POA: Diagnosis not present

## 2022-09-05 ENCOUNTER — Ambulatory Visit (INDEPENDENT_AMBULATORY_CARE_PROVIDER_SITE_OTHER): Payer: Medicare PPO

## 2022-09-05 DIAGNOSIS — Z23 Encounter for immunization: Secondary | ICD-10-CM | POA: Diagnosis not present

## 2022-09-11 ENCOUNTER — Encounter: Payer: Self-pay | Admitting: Nurse Practitioner

## 2022-09-11 ENCOUNTER — Ambulatory Visit: Payer: Medicare PPO | Admitting: Nurse Practitioner

## 2022-09-11 VITALS — BP 132/72 | HR 69 | Temp 97.9°F | Resp 20 | Ht 64.0 in | Wt 204.0 lb

## 2022-09-11 DIAGNOSIS — F419 Anxiety disorder, unspecified: Secondary | ICD-10-CM | POA: Diagnosis not present

## 2022-09-11 DIAGNOSIS — M810 Age-related osteoporosis without current pathological fracture: Secondary | ICD-10-CM

## 2022-09-11 DIAGNOSIS — E119 Type 2 diabetes mellitus without complications: Secondary | ICD-10-CM | POA: Diagnosis not present

## 2022-09-11 DIAGNOSIS — Z953 Presence of xenogenic heart valve: Secondary | ICD-10-CM | POA: Diagnosis not present

## 2022-09-11 DIAGNOSIS — I10 Essential (primary) hypertension: Secondary | ICD-10-CM | POA: Diagnosis not present

## 2022-09-11 DIAGNOSIS — Z6838 Body mass index (BMI) 38.0-38.9, adult: Secondary | ICD-10-CM | POA: Diagnosis not present

## 2022-09-11 DIAGNOSIS — E782 Mixed hyperlipidemia: Secondary | ICD-10-CM | POA: Diagnosis not present

## 2022-09-11 LAB — LIPID PANEL
Chol/HDL Ratio: 2.8 ratio (ref 0.0–4.4)
Cholesterol, Total: 100 mg/dL (ref 100–199)
HDL: 36 mg/dL — ABNORMAL LOW (ref >39)
LDL Chol Calc (NIH): 44 mg/dL (ref 0–99)
Triglycerides: 104 mg/dL (ref 0–149)
VLDL Cholesterol Cal: 20 mg/dL (ref 5–40)

## 2022-09-11 LAB — CBC WITH DIFFERENTIAL/PLATELET
Basophils Absolute: 0.1 10*3/uL (ref 0.0–0.2)
Basos: 1 %
EOS (ABSOLUTE): 0.2 10*3/uL (ref 0.0–0.4)
Eos: 3 %
Hematocrit: 40.6 % (ref 34.0–46.6)
Hemoglobin: 13.6 g/dL (ref 11.1–15.9)
Immature Grans (Abs): 0 10*3/uL (ref 0.0–0.1)
Immature Granulocytes: 0 %
Lymphocytes Absolute: 2.3 10*3/uL (ref 0.7–3.1)
Lymphs: 25 %
MCH: 31.3 pg (ref 26.6–33.0)
MCHC: 33.5 g/dL (ref 31.5–35.7)
MCV: 93 fL (ref 79–97)
Monocytes Absolute: 0.8 10*3/uL (ref 0.1–0.9)
Monocytes: 9 %
Neutrophils Absolute: 5.9 10*3/uL (ref 1.4–7.0)
Neutrophils: 62 %
Platelets: 286 10*3/uL (ref 150–450)
RBC: 4.35 x10E6/uL (ref 3.77–5.28)
RDW: 13 % (ref 11.7–15.4)
WBC: 9.4 10*3/uL (ref 3.4–10.8)

## 2022-09-11 LAB — BAYER DCA HB A1C WAIVED: HB A1C (BAYER DCA - WAIVED): 6.9 % — ABNORMAL HIGH (ref 4.8–5.6)

## 2022-09-11 LAB — CMP14+EGFR
ALT: 15 IU/L (ref 0–32)
AST: 15 IU/L (ref 0–40)
Albumin/Globulin Ratio: 2 (ref 1.2–2.2)
Albumin: 4.3 g/dL (ref 3.8–4.8)
Alkaline Phosphatase: 89 IU/L (ref 44–121)
BUN/Creatinine Ratio: 19 (ref 12–28)
BUN: 17 mg/dL (ref 8–27)
Bilirubin Total: 0.4 mg/dL (ref 0.0–1.2)
CO2: 25 mmol/L (ref 20–29)
Calcium: 10.7 mg/dL — ABNORMAL HIGH (ref 8.7–10.3)
Chloride: 101 mmol/L (ref 96–106)
Creatinine, Ser: 0.88 mg/dL (ref 0.57–1.00)
Globulin, Total: 2.2 g/dL (ref 1.5–4.5)
Glucose: 154 mg/dL — ABNORMAL HIGH (ref 70–99)
Potassium: 4.3 mmol/L (ref 3.5–5.2)
Sodium: 142 mmol/L (ref 134–144)
Total Protein: 6.5 g/dL (ref 6.0–8.5)
eGFR: 68 mL/min/{1.73_m2} (ref >59)

## 2022-09-11 MED ORDER — HYDROCHLOROTHIAZIDE 25 MG PO TABS: 25.0000 mg | ORAL_TABLET | Freq: Every day | ORAL | 1 refills | Status: DC

## 2022-09-11 MED ORDER — LORAZEPAM 0.5 MG PO TABS: 0.5000 mg | ORAL_TABLET | Freq: Two times a day (BID) | ORAL | 1 refills | Status: DC | PRN

## 2022-09-11 MED ORDER — LISINOPRIL 20 MG PO TABS: 20.0000 mg | ORAL_TABLET | Freq: Every day | ORAL | 1 refills | Status: DC

## 2022-09-11 MED ORDER — ATORVASTATIN CALCIUM 10 MG PO TABS: 10.0000 mg | ORAL_TABLET | Freq: Every day | ORAL | 1 refills | Status: DC

## 2022-09-11 MED ORDER — METOPROLOL TARTRATE 25 MG PO TABS: 12.5000 mg | ORAL_TABLET | Freq: Two times a day (BID) | ORAL | 1 refills | Status: DC

## 2022-09-11 MED ORDER — METFORMIN HCL ER 500 MG PO TB24: 500.0000 mg | ORAL_TABLET | Freq: Two times a day (BID) | ORAL | 1 refills | Status: DC

## 2022-09-11 NOTE — Progress Notes (Signed)
Subjective:    Patient ID: Toni Cooper, female    DOB: 1945/11/28, 76 y.o.   MRN: [id number]   Chief Complaint: medical management of chronic issues     HPI:  Toni Cooper is a 76 y.o. who identifies as a female who was assigned female at birth.   Social history: Lives with: husband Work history: rtired   Comes in today for follow up of the following chronic medical issues:  1. Primary hypertension No c/o chest pain, sob or headache. Doe snot check blood pressure at home. BP Readings from Last 3 Encounters:  04/05/22 134/70  12/06/21 140/70  10/06/21 140/71     2. Mixed hyperlipidemia Does not watch diet and does no exercise at all. Lab Results  Component Value Date   CHOL 110 04/05/2022   HDL 43 04/05/2022   LDLCALC 48 04/05/2022   TRIG 103 04/05/2022   CHOLHDL 2.6 04/05/2022     3. Type 2 diabetes mellitus without complication, without long-term current use of insulin (HCC) Fasting blood sugars are running around 120-140. No low blood sugars. She sees Dr. Chalmers Cater Lab Results  Component Value Date   HGBA1C 7.0 (H) 04/05/2022     4. Age-related osteoporosis without current pathological fracture Last dexascan was done on 11/28/2016. She has refused to repeat in th wpast.  5. Class 2 severe obesity due to excess calories with serious comorbidity and body mass index (BMI) of 38.0 to 38.9 in adult [facility]) Weight is down 14lbs Wt Readings from Last 3 Encounters:  09/11/22 204 lb (92.5 kg)  04/05/22 218 lb (98.9 kg)  12/06/21 216 lb 12.8 oz (98.3 kg)   BMI Readings from Last 3 Encounters:  09/11/22 35.02 kg/m  04/05/22 37.42 kg/m  12/06/21 37.21 kg/m     New complaints: None today  Allergies  Allergen Reactions   Sulfa Antibiotics Nausea And Vomiting   Alendronate Other (See Comments)   Sulfamethoxazole-Trimethoprim Other (See Comments)   Outpatient Encounter Medications as of 09/11/2022  Medication Sig   acetaminophen (TYLENOL) 500 MG tablet Take  500 mg 2 (two) times daily as needed by mouth for moderate pain or headache.    Apoaequorin (PREVAGEN PO) Take by mouth.   aspirin 81 MG EC tablet Take 1 tablet (81 mg total) by mouth daily. (Patient taking differently: Take 81 mg by mouth in the morning and at bedtime.)   atorvastatin (LIPITOR) 10 MG tablet Take 1 tablet (10 mg total) by mouth daily.   beta carotene w/minerals (OCUVITE) tablet Take 1 tablet by mouth daily.   calcium citrate (CALCITRATE - DOSED IN MG ELEMENTAL CALCIUM) 950 (200 Ca) MG tablet 1 tablet   cholecalciferol (VITAMIN D) 1000 UNITS tablet Take 1,000 Units by mouth daily.   ferrous sulfate 324 MG TBEC 1 tablet   fluticasone (FLONASE) 50 MCG/ACT nasal spray    hydrochlorothiazide (HYDRODIURIL) 25 MG tablet Take 1 tablet (25 mg total) by mouth daily.   LANTUS 100 UNIT/ML injection Inject 35 Units into the skin at bedtime.    lisinopril (ZESTRIL) 20 MG tablet Take 1 tablet (20 mg total) by mouth daily.   LORazepam (ATIVAN) 0.5 MG tablet Take 1 tablet (0.5 mg total) by mouth 2 (two) times daily as needed for anxiety.   Magnesium 250 MG TABS Take 250 mg by mouth daily.    metFORMIN (GLUCOPHAGE-XR) 500 MG 24 hr tablet Take 1 tablet (500 mg total) by mouth 2 (two) times daily.   metoprolol tartrate (LOPRESSOR)  25 MG tablet Take 0.5 tablets (12.5 mg total) by mouth 2 (two) times daily.   Multiple Vitamins-Minerals (MULTI-BETIC DIABETES) TABS Take 1 tablet daily by mouth.   NOVOLOG 100 UNIT/ML injection Inject into the skin.   pyridOXINE (VITAMIN B-6) 100 MG tablet Take 100 mg by mouth daily.   Semaglutide,0.25 or 0.5MG/DOS, (OZEMPIC, 0.25 OR 0.5 MG/DOSE,) 2 MG/1.5ML SOPN Inject 0.5 mg into the skin once a week.   Teriparatide, Recombinant, (FORTEO [place]) Inject into the skin.   TRUETRACK TEST test strip Test four times daily.   vitamin C (ASCORBIC ACID) 500 MG tablet Take 500 mg by mouth daily.   No facility-administered encounter medications on file as of 09/11/2022.    Past  Surgical History:  Procedure Laterality Date   APPENDECTOMY     FEMUR FRACTURE SURGERY  07/30/2020   FRACTURE SURGERY     shoulder   TEE WITHOUT CARDIOVERSION N/A 04/18/2016   Procedure: TRANSESOPHAGEAL ECHOCARDIOGRAM (TEE);  Surgeon: Thayer Headings, MD;  Location: [place];  Service: Cardiovascular;  Laterality: N/A;   TEE WITHOUT CARDIOVERSION N/A 10/16/2017   Procedure: TRANSESOPHAGEAL ECHOCARDIOGRAM (TEE);  Surgeon: Rexene Alberts, MD;  Location: [place];  Service: Open Heart Surgery;  Laterality: N/A;   TOTAL ABDOMINAL HYSTERECTOMY     WRIST SURGERY      Family History  Problem Relation Age of Onset   Stroke Father 58   Diabetes Father    Retinopathy of prematurity Father    Heart failure Mother    Heart attack Maternal Grandmother    Heart attack Paternal Grandmother    Diabetes Paternal Aunt    Diabetes Paternal Uncle       Controlled substance contract: n/a     Review of Systems  Constitutional:  Negative for diaphoresis.  Eyes:  Negative for pain.  Respiratory:  Negative for shortness of breath.   Cardiovascular:  Negative for chest pain, palpitations and leg swelling.  Gastrointestinal:  Negative for abdominal pain.  Endocrine: Negative for polydipsia.  Skin:  Negative for rash.  Neurological:  Negative for dizziness, weakness and headaches.  Hematological:  Does not bruise/bleed easily.  All other systems reviewed and are negative.      Objective:   Physical Exam Vitals and nursing note reviewed.  Constitutional:      General: She is not in acute distress.    Appearance: Normal appearance. She is well-developed.  HENT:     Head: Normocephalic.     Right Ear: Tympanic membrane normal.     Left Ear: Tympanic membrane normal.     Nose: Nose normal.     Mouth/Throat:     Mouth: Mucous membranes are moist.  Eyes:     Pupils: Pupils are equal, round, and reactive to light.  Neck:     Vascular: No carotid bruit or JVD.  Cardiovascular:     Rate  and Rhythm: Normal rate and regular rhythm.     Heart sounds: Normal heart sounds.  Pulmonary:     Effort: Pulmonary effort is normal. No respiratory distress.     Breath sounds: Normal breath sounds. No wheezing or rales.  Chest:     Chest wall: No tenderness.  Abdominal:     General: Bowel sounds are normal. There is no distension or abdominal bruit.     Palpations: Abdomen is soft. There is no hepatomegaly, splenomegaly, mass or pulsatile mass.     Tenderness: There is no abdominal tenderness.  Musculoskeletal:  General: Normal range of motion.     Cervical back: Normal range of motion and neck supple.  Lymphadenopathy:     Cervical: No cervical adenopathy.  Skin:    General: Skin is warm and dry.  Neurological:     Mental Status: She is alert and oriented to person, place, and time.     Deep Tendon Reflexes: Reflexes are normal and symmetric.  Psychiatric:        Behavior: Behavior normal.        Thought Content: Thought content normal.        Judgment: Judgment normal.     BP 132/72   Pulse 69   Temp 97.9 F (36.6 C) (Temporal)   Resp 20   Ht 5' 4" (1.626 m)   Wt 204 lb (92.5 kg)   SpO2 98%   BMI 35.02 kg/m   Hgba1c 6.9%     Assessment & Plan:  Toni Cooper comes in today with chief complaint of Medical Management of Chronic Issues   Diagnosis and orders addressed:  1. Primary hypertension Low sodium diet - CBC with Differential/Platelet - CMP14+EGFR - hydrochlorothiazide (HYDRODIURIL) 25 MG tablet; Take 1 tablet (25 mg total) by mouth daily.  Dispense: 90 tablet; Refill: 1 - lisinopril (ZESTRIL) 20 MG tablet; Take 1 tablet (20 mg total) by mouth daily.  Dispense: 90 tablet; Refill: 1  2. Mixed hyperlipidemia Low fat diet - Lipid panel - atorvastatin (LIPITOR) 10 MG tablet; Take 1 tablet (10 mg total) by mouth daily.  Dispense: 90 tablet; Refill: 1  3. Type 2 diabetes mellitus without complication, without long-term current use of insulin  (HCC) Continue to wathc carbs in diet Keep followup appointments with Dr. Chalmers Cater - Bayer DCA Hb A1c Waived - Microalbumin / creatinine urine ratio - metFORMIN (GLUCOPHAGE-XR) 500 MG 24 hr tablet; Take 1 tablet (500 mg total) by mouth 2 (two) times daily.  Dispense: 180 tablet; Refill: 1  4. Age-related osteoporosis without current pathological fracture Refused repeat dexascan  5. Class 2 severe obesity due to excess calories with serious comorbidity and body mass index (BMI) of 38.0 to 38.9 in adult [facility]) Discussed diet and exercise for person with BMI >25 Will recheck weight in 3-6 months   6. Anxiety Stress management - LORazepam (ATIVAN) 0.5 MG tablet; Take 1 tablet (0.5 mg total) by mouth 2 (two) times daily as needed for anxiety.  Dispense: 30 tablet; Refill: 1  7. S/P minimally invasive aortic valve replacement with bioprosthetic valve - metoprolol tartrate (LOPRESSOR) 25 MG tablet; Take 0.5 tablets (12.5 mg total) by mouth 2 (two) times daily.  Dispense: 90 tablet; Refill: 1  Will schedule AWV Labs pending Health Maintenance reviewed Diet and exercise encouraged  Follow up plan: 6 months   Mary-Margaret Hassell Done, FNP

## 2022-09-11 NOTE — Patient Instructions (Signed)
Recombinant Zoster (Shingles) Vaccine: What You Need to Know 1. Why get vaccinated? Recombinant zoster (shingles) vaccine can prevent shingles. Shingles (also called herpes zoster, or just zoster) is a painful skin rash, usually with blisters. In addition to the rash, shingles can cause fever, headache, chills, or upset stomach. Rarely, shingles can lead to complications such as pneumonia, hearing problems, blindness, brain inflammation (encephalitis), or death. The risk of shingles increases with age. The most common complication of shingles is long-term nerve pain called postherpetic neuralgia (PHN). PHN occurs in the areas where the shingles rash was and can last for months or years after the rash goes away. The pain from PHN can be severe and debilitating. The risk of PHN increases with age. An older adult with shingles is more likely to develop PHN and have longer lasting and more severe pain than a younger person. People with weakened immune systems also have a higher risk of getting shingles and complications from the disease. Shingles is caused by varicella-zoster virus, the same virus that causes chickenpox. After you have chickenpox, the virus stays in your body and can cause shingles later in life. Shingles cannot be passed from one person to another, but the virus that causes shingles can spread and cause chickenpox in someone who has never had chickenpox or has never received chickenpox vaccine. 2. Recombinant shingles vaccine Recombinant shingles vaccine provides strong protection against shingles. By preventing shingles, recombinant shingles vaccine also protects against PHN and other complications. Recombinant shingles vaccine is recommended for: Adults 68 years and older Adults 19 years and older who have a weakened immune system because of disease or treatments Shingles vaccine is given as a two-dose series. For most people, the second dose should be given 2 to 6 months after the first  dose. Some people who have or will have a weakened immune system can get the second dose 1 to 2 months after the first dose. Ask your health care provider for guidance. People who have had shingles in the past and people who have received varicella (chickenpox) vaccine are recommended to get recombinant shingles vaccine. The vaccine is also recommended for people who have already gotten another type of shingles vaccine, the live shingles vaccine. There is no live virus in recombinant shingles vaccine. Shingles vaccine may be given at the same time as other vaccines. 3. Talk with your health care provider Tell your vaccination provider if the person getting the vaccine: Has had an allergic reaction after a previous dose of recombinant shingles vaccine, or has any severe, life-threatening allergies Is currently experiencing an episode of shingles Is pregnant In some cases, your health care provider may decide to postpone shingles vaccination until a future visit. People with minor illnesses, such as a cold, may be vaccinated. People who are moderately or severely ill should usually wait until they recover before getting recombinant shingles vaccine. Your health care provider can give you more information. 4. Risks of a vaccine reaction A sore arm with mild or moderate pain is very common after recombinant shingles vaccine. Redness and swelling can also happen at the site of the injection. Tiredness, muscle pain, headache, shivering, fever, stomach pain, and nausea are common after recombinant shingles vaccine. These side effects may temporarily prevent a vaccinated person from doing regular activities. Symptoms usually go away on their own in 2 to 3 days. You should still get the second dose of recombinant shingles vaccine even if you had one of these reactions after the first dose. Guillain-Barr  syndrome (GBS), a serious nervous system disorder, has been reported very rarely after recombinant zoster  vaccine. People sometimes faint after medical procedures, including vaccination. Tell your provider if you feel dizzy or have vision changes or ringing in the ears. As with any medicine, there is a very remote chance of a vaccine causing a severe allergic reaction, other serious injury, or death. 5. What if there is a serious problem? An allergic reaction could occur after the vaccinated person leaves the clinic. If you see signs of a severe allergic reaction (hives, swelling of the face and throat, difficulty breathing, a fast heartbeat, dizziness, or weakness), call 9-1-1 and get the person to the nearest hospital. For other signs that concern you, call your health care provider. Adverse reactions should be reported to the Vaccine Adverse Event Reporting System (VAERS). Your health care provider will usually file this report, or you can do it yourself. Visit the VAERS website at www.vaers.SamedayNews.es or call 2011911673. VAERS is only for reporting reactions, and VAERS staff members do not give medical advice. 6. How can I learn more? Ask your health care provider. Call your local or state health department. Visit the website of the Food and Drug Administration (FDA) for vaccine package inserts and additional information at http://lopez-wang.org/. Contact the Centers for Disease Control and Prevention (CDC): Call 402-321-3175 (1-800-CDC-INFO) or Visit CDC's website at http://hunter.com/. Source: CDC Vaccine Information Statement Recombinant Zoster Vaccine (12/23/2020) This same material is available at http://www.wolf.info/ for no charge. This information is not intended to replace advice given to you by your health care provider. Make sure you discuss any questions you have with your health care provider. Document Revised: 10/04/2021 Document Reviewed: 01/06/2021 Elsevier Patient Education  [place].

## 2022-09-13 LAB — MICROALBUMIN / CREATININE URINE RATIO
Creatinine, Urine: 245.5 mg/dL
Microalb/Creat Ratio: 12 mg/g creat (ref 0–29)
Microalbumin, Urine: 28.3 ug/mL

## 2022-10-02 DIAGNOSIS — E1165 Type 2 diabetes mellitus with hyperglycemia: Secondary | ICD-10-CM | POA: Diagnosis not present

## 2022-11-01 DIAGNOSIS — E1165 Type 2 diabetes mellitus with hyperglycemia: Secondary | ICD-10-CM | POA: Diagnosis not present

## 2022-12-02 DIAGNOSIS — E1165 Type 2 diabetes mellitus with hyperglycemia: Secondary | ICD-10-CM | POA: Diagnosis not present

## 2022-12-05 DIAGNOSIS — Z780 Asymptomatic menopausal state: Secondary | ICD-10-CM | POA: Diagnosis not present

## 2022-12-05 DIAGNOSIS — M810 Age-related osteoporosis without current pathological fracture: Secondary | ICD-10-CM | POA: Diagnosis not present

## 2022-12-17 DIAGNOSIS — M810 Age-related osteoporosis without current pathological fracture: Secondary | ICD-10-CM | POA: Diagnosis not present

## 2022-12-30 NOTE — Progress Notes (Unsigned)
Cardiology Office Note   Date:  12/30/2022   ID:  Dustie, Tacke August 04, 1946, MRN [id number]  PCP:  [name], FNP  Cardiologist:   None   No chief complaint on file.     History of Present Illness: Toni Cooper is a 77 y.o. female who presents for follow up after SAVR.   Since I last saw her ***   ***  she has done well.  She is finally recovered from surgery for her femur fractures.  She gets around slowly with a cane.  She has not yet started doing a lot of physical activity as she just had wound healing and was released from the orthopedist. The patient denies any new symptoms such as chest discomfort, neck or arm discomfort. There has been no new shortness of breath, PND or orthopnea. There have been no reported palpitations, presyncope or syncope.   Past Medical History:  Diagnosis Date   Aortic stenosis, severe 2015   Cataract    Diabetes mellitus without complication ([place])    Dyspnea    Hypertension    Obesity    Osteoporosis    S/P minimally invasive aortic valve replacement with bioprosthetic valve 10/16/2017   23 mm [employer] Intuity Elite rapid deployment bovine stented bioprosthetic tissue valve via partial upper sternotomy   Squamous cell carcinoma of skin 03/30/2020   KA on left thigh, anterior([id number])    Past Surgical History:  Procedure Laterality Date   APPENDECTOMY     FEMUR FRACTURE SURGERY  07/30/2020   FRACTURE SURGERY     shoulder   TEE WITHOUT CARDIOVERSION N/A 04/18/2016   Procedure: TRANSESOPHAGEAL ECHOCARDIOGRAM (TEE);  Surgeon: Thayer Headings, MD;  Location: [place];  Service: Cardiovascular;  Laterality: N/A;   TEE WITHOUT CARDIOVERSION N/A 10/16/2017   Procedure: TRANSESOPHAGEAL ECHOCARDIOGRAM (TEE);  Surgeon: Rexene Alberts, MD;  Location: [place];  Service: Open Heart Surgery;  Laterality: N/A;   TOTAL ABDOMINAL HYSTERECTOMY     WRIST SURGERY       Current Outpatient Medications  Medication Sig Dispense Refill    acetaminophen (TYLENOL) 500 MG tablet Take 500 mg 2 (two) times daily as needed by mouth for moderate pain or headache.      Apoaequorin (PREVAGEN PO) Take by mouth.     aspirin 81 MG EC tablet Take 1 tablet (81 mg total) by mouth daily. (Patient taking differently: Take 81 mg by mouth in the morning and at bedtime.)     atorvastatin (LIPITOR) 10 MG tablet Take 1 tablet (10 mg total) by mouth daily. 90 tablet 1   beta carotene w/minerals (OCUVITE) tablet Take 1 tablet by mouth daily.     calcium citrate (CALCITRATE - DOSED IN MG ELEMENTAL CALCIUM) 950 (200 Ca) MG tablet 1 tablet     cholecalciferol (VITAMIN D) 1000 UNITS tablet Take 1,000 Units by mouth daily.     ferrous sulfate 324 MG TBEC 1 tablet     fluticasone (FLONASE) 50 MCG/ACT nasal spray      hydrochlorothiazide (HYDRODIURIL) 25 MG tablet Take 1 tablet (25 mg total) by mouth daily. 90 tablet 1   LANTUS 100 UNIT/ML injection Inject 35 Units into the skin at bedtime.      lisinopril (ZESTRIL) 20 MG tablet Take 1 tablet (20 mg total) by mouth daily. 90 tablet 1   LORazepam (ATIVAN) 0.5 MG tablet Take 1 tablet (0.5 mg total) by mouth 2 (two) times daily as needed for anxiety. 30 tablet 1  Magnesium 250 MG TABS Take 250 mg by mouth daily.      metFORMIN (GLUCOPHAGE-XR) 500 MG 24 hr tablet Take 1 tablet (500 mg total) by mouth 2 (two) times daily. 180 tablet 1   metoprolol tartrate (LOPRESSOR) 25 MG tablet Take 0.5 tablets (12.5 mg total) by mouth 2 (two) times daily. 90 tablet 1   Multiple Vitamins-Minerals (MULTI-BETIC DIABETES) TABS Take 1 tablet daily by mouth.     NOVOLOG 100 UNIT/ML injection Inject into the skin.     pyridOXINE (VITAMIN B-6) 100 MG tablet Take 100 mg by mouth daily.     Semaglutide,0.25 or 0.5MG/DOS, (OZEMPIC, 0.25 OR 0.5 MG/DOSE,) 2 MG/1.5ML SOPN Inject 0.5 mg into the skin once a week. 4.5 mL 1   Teriparatide, Recombinant, (FORTEO [place]) Inject into the skin.     TRUETRACK TEST test strip Test four times daily.      vitamin C (ASCORBIC ACID) 500 MG tablet Take 500 mg by mouth daily.     No current facility-administered medications for this visit.    Allergies:   Sulfa antibiotics, Alendronate, and Sulfamethoxazole-trimethoprim    ROS:  Please see the history of present illness.   Otherwise, review of systems are positive for ***.   All other systems are reviewed and negative.    PHYSICAL EXAM: VS:  There were no vitals taken for this visit. , BMI There is no height or weight on file to calculate BMI. ***GENERAL:  Well appearing NECK:  No jugular venous distention, waveform within normal limits, carotid upstroke brisk and symmetric, no bruits, no thyromegaly LUNGS:  Clear to auscultation bilaterally CHEST:  Unremarkable HEART:  PMI not displaced or sustained,S1 and S2 within normal limits, no S3, no S4, no clicks, no rubs, *** murmurs ABD:  Flat, positive bowel sounds normal in frequency in pitch, no bruits, no rebound, no guarding, no midline pulsatile mass, no hepatomegaly, no splenomegaly EXT:  2 plus pulses throughout, no edema, no cyanosis no clubbing    ***GENERAL:  Well appearing NECK:  No jugular venous distention, waveform within normal limits, carotid upstroke brisk and symmetric, no bruits, no thyromegaly LUNGS:  Clear to auscultation bilaterally CHEST:  Unremarkable HEART:  PMI not displaced or sustained,S1 and S2 within normal limits, no S3, no S4, no clicks, no rubs, very soft brief systolic murmur radiating slightly at the aortic outflow tract, no diastolic murmurs ABD:  Flat, positive bowel sounds normal in frequency in pitch, no bruits, no rebound, no guarding, no midline pulsatile mass, no hepatomegaly, no splenomegaly EXT:  2 plus pulses throughout, no edema, no cyanosis no clubbing   EKG:  EKG is *** ordered today. The ekg ordered today demonstrates sinus rhythm, ***, borderline interventricular conduction delay.  ***   Recent Labs: 09/11/2022: ALT 15; BUN 17; Creatinine,  Ser 0.88; Hemoglobin 13.6; Platelets 286; Potassium 4.3; Sodium 142    Lipid Panel    Component Value Date/Time   CHOL 100 09/11/2022 0804   TRIG 104 09/11/2022 0804   TRIG 74 02/25/2014 0000   HDL 36 (L) 09/11/2022 0804   CHOLHDL 2.8 09/11/2022 0804   LDLCALC 44 09/11/2022 0804      Wt Readings from Last 3 Encounters:  09/11/22 204 lb (92.5 kg)  04/05/22 218 lb (98.9 kg)  12/06/21 216 lb 12.8 oz (98.3 kg)      Other studies Reviewed: Additional studies/ records that were reviewed today include: *** Review of the above records demonstrates:  Please see elsewhere in the note.  ASSESSMENT AND PLAN:  SAVR:    *** The patient has no new cardiovascular complaints.  Her physical exam is unchanged.  I will likely repeat an echocardiogram next year when I see her.    OVERWEIGHT:   ***   I continue to encourage weight loss.  Certainly this should be paying attention to some diet.  But I gave her specific instructions about getting back on her stationary bicycle and some goals for that.    DM:  A1C was *** 6.9.  No change in therapy.   HTN:   ***  Blood pressure is at target although I would not want any higher than this.  No change in therapy.    Current medicines are reviewed at length with the patient today.  The patient does not have concerns regarding medicines.  The following changes have been made: ***  Labs/ tests ordered today include: ***  No orders of the defined types were placed in this encounter.    Disposition:   FU with me ***   Signed, [name], MD  12/30/2022 8:39 PM    [place]

## 2023-01-02 ENCOUNTER — Encounter: Payer: Self-pay | Admitting: Cardiology

## 2023-01-02 ENCOUNTER — Ambulatory Visit: Payer: Medicare PPO | Admitting: Cardiology

## 2023-01-02 VITALS — BP 130/68 | HR 68 | Ht 64.0 in | Wt 200.0 lb

## 2023-01-02 DIAGNOSIS — Z952 Presence of prosthetic heart valve: Secondary | ICD-10-CM

## 2023-01-02 DIAGNOSIS — E1165 Type 2 diabetes mellitus with hyperglycemia: Secondary | ICD-10-CM | POA: Diagnosis not present

## 2023-01-02 DIAGNOSIS — E118 Type 2 diabetes mellitus with unspecified complications: Secondary | ICD-10-CM

## 2023-01-02 DIAGNOSIS — I10 Essential (primary) hypertension: Secondary | ICD-10-CM | POA: Diagnosis not present

## 2023-01-02 NOTE — Patient Instructions (Signed)
Medication Instructions:  The current medical regimen is effective;  continue present plan and medications.  *If you need a refill on your cardiac medications before your next appointment, please call your pharmacy*  Testing/Procedures: Your physician has requested that you have an echocardiogram. Echocardiography is a painless test that uses sound waves to create images of your heart. It provides your doctor with information about the size and shape of your heart and how well your heart's chambers and valves are working. This procedure takes approximately one hour. There are no restrictions for this procedure. Please do NOT wear cologne, perfume, aftershave, or lotions (deodorant is allowed). Please arrive 15 minutes prior to your appointment time. You will be contacted to be scheduled at [facility] office.  Follow-Up: At [facility], you and your health needs are our priority.  As part of our continuing mission to provide you with exceptional heart care, we have created designated Provider Care Teams.  These Care Teams include your primary Cardiologist (physician) and Advanced Practice Providers (APPs -  Physician Assistants and Nurse Practitioners) who all work together to provide you with the care you need, when you need it.  We recommend signing up for the patient portal called "MyChart".  Sign up information is provided on this After Visit Summary.  MyChart is used to connect with patients for Virtual Visits (Telemedicine).  Patients are able to view lab/test results, encounter notes, upcoming appointments, etc.  Non-urgent messages can be sent to your provider as well.   To learn more about what you can do with MyChart, go to [email or web address].    Your next appointment:   1 year(s)  Provider:   [name], MD

## 2023-01-08 ENCOUNTER — Telehealth: Payer: Self-pay | Admitting: Nurse Practitioner

## 2023-01-08 NOTE — Telephone Encounter (Signed)
Contacted [name] to schedule their annual wellness visit. Patient declined to schedule AWV at this time. Does not want to schedule this year  Thank you,  [place] ??[phone]

## 2023-01-29 ENCOUNTER — Ambulatory Visit (HOSPITAL_COMMUNITY): Payer: Medicare PPO | Attending: Cardiology

## 2023-01-29 DIAGNOSIS — I10 Essential (primary) hypertension: Secondary | ICD-10-CM

## 2023-01-29 DIAGNOSIS — Z952 Presence of prosthetic heart valve: Secondary | ICD-10-CM | POA: Insufficient documentation

## 2023-01-30 LAB — ECHOCARDIOGRAM COMPLETE
AR max vel: 1.19 cm2
AV Area VTI: 1.72 cm2
AV Area mean vel: 1.43 cm2
AV Mean grad: 7 mmHg
AV Peak grad: 14.1 mmHg
Ao pk vel: 1.88 m/s
Area-P 1/2: 3.72 cm2
S' Lateral: 2.7 cm

## 2023-01-31 DIAGNOSIS — H40033 Anatomical narrow angle, bilateral: Secondary | ICD-10-CM | POA: Diagnosis not present

## 2023-01-31 DIAGNOSIS — E119 Type 2 diabetes mellitus without complications: Secondary | ICD-10-CM | POA: Diagnosis not present

## 2023-01-31 DIAGNOSIS — E1165 Type 2 diabetes mellitus with hyperglycemia: Secondary | ICD-10-CM | POA: Diagnosis not present

## 2023-02-06 ENCOUNTER — Encounter: Payer: Self-pay | Admitting: *Deleted

## 2023-02-19 ENCOUNTER — Ambulatory Visit: Payer: Medicare PPO | Admitting: Nurse Practitioner

## 2023-02-19 ENCOUNTER — Encounter: Payer: Self-pay | Admitting: Nurse Practitioner

## 2023-02-19 VITALS — BP 141/74 | HR 67 | Temp 97.2°F | Resp 20 | Ht 64.0 in | Wt 200.0 lb

## 2023-02-19 DIAGNOSIS — M810 Age-related osteoporosis without current pathological fracture: Secondary | ICD-10-CM | POA: Diagnosis not present

## 2023-02-19 DIAGNOSIS — Z953 Presence of xenogenic heart valve: Secondary | ICD-10-CM

## 2023-02-19 DIAGNOSIS — G609 Hereditary and idiopathic neuropathy, unspecified: Secondary | ICD-10-CM

## 2023-02-19 DIAGNOSIS — F419 Anxiety disorder, unspecified: Secondary | ICD-10-CM | POA: Diagnosis not present

## 2023-02-19 DIAGNOSIS — I10 Essential (primary) hypertension: Secondary | ICD-10-CM

## 2023-02-19 DIAGNOSIS — E785 Hyperlipidemia, unspecified: Secondary | ICD-10-CM

## 2023-02-19 DIAGNOSIS — Z6834 Body mass index (BMI) 34.0-34.9, adult: Secondary | ICD-10-CM

## 2023-02-19 DIAGNOSIS — E782 Mixed hyperlipidemia: Secondary | ICD-10-CM | POA: Diagnosis not present

## 2023-02-19 DIAGNOSIS — E1169 Type 2 diabetes mellitus with other specified complication: Secondary | ICD-10-CM

## 2023-02-19 DIAGNOSIS — E119 Type 2 diabetes mellitus without complications: Secondary | ICD-10-CM

## 2023-02-19 LAB — CBC WITH DIFFERENTIAL/PLATELET
Basophils Absolute: 0.1 10*3/uL (ref 0.0–0.2)
Basos: 1 %
EOS (ABSOLUTE): 0.2 10*3/uL (ref 0.0–0.4)
Eos: 2 %
Hematocrit: 40.5 % (ref 34.0–46.6)
Hemoglobin: 13.1 g/dL (ref 11.1–15.9)
Immature Grans (Abs): 0 10*3/uL (ref 0.0–0.1)
Immature Granulocytes: 0 %
Lymphocytes Absolute: 2.3 10*3/uL (ref 0.7–3.1)
Lymphs: 26 %
MCH: 30.9 pg (ref 26.6–33.0)
MCHC: 32.3 g/dL (ref 31.5–35.7)
MCV: 96 fL (ref 79–97)
Monocytes Absolute: 0.7 10*3/uL (ref 0.1–0.9)
Monocytes: 8 %
Neutrophils Absolute: 5.3 10*3/uL (ref 1.4–7.0)
Neutrophils: 63 %
Platelets: 266 10*3/uL (ref 150–450)
RBC: 4.24 x10E6/uL (ref 3.77–5.28)
RDW: 13.1 % (ref 11.7–15.4)
WBC: 8.6 10*3/uL (ref 3.4–10.8)

## 2023-02-19 LAB — CMP14+EGFR
ALT: 17 IU/L (ref 0–32)
AST: 21 IU/L (ref 0–40)
Albumin/Globulin Ratio: 1.8 (ref 1.2–2.2)
Albumin: 4.1 g/dL (ref 3.8–4.8)
Alkaline Phosphatase: 64 IU/L (ref 44–121)
BUN/Creatinine Ratio: 27 (ref 12–28)
BUN: 24 mg/dL (ref 8–27)
Bilirubin Total: 0.4 mg/dL (ref 0.0–1.2)
CO2: 25 mmol/L (ref 20–29)
Calcium: 10.2 mg/dL (ref 8.7–10.3)
Chloride: 103 mmol/L (ref 96–106)
Creatinine, Ser: 0.88 mg/dL (ref 0.57–1.00)
Globulin, Total: 2.3 g/dL (ref 1.5–4.5)
Glucose: 148 mg/dL — ABNORMAL HIGH (ref 70–99)
Potassium: 4.7 mmol/L (ref 3.5–5.2)
Sodium: 141 mmol/L (ref 134–144)
Total Protein: 6.4 g/dL (ref 6.0–8.5)
eGFR: 68 mL/min/{1.73_m2} (ref >59)

## 2023-02-19 LAB — BAYER DCA HB A1C WAIVED: HB A1C (BAYER DCA - WAIVED): 6.8 % — ABNORMAL HIGH (ref 4.8–5.6)

## 2023-02-19 LAB — LIPID PANEL
Chol/HDL Ratio: 2.6 ratio (ref 0.0–4.4)
Cholesterol, Total: 106 mg/dL (ref 100–199)
HDL: 41 mg/dL (ref >39)
LDL Chol Calc (NIH): 47 mg/dL (ref 0–99)
Triglycerides: 93 mg/dL (ref 0–149)
VLDL Cholesterol Cal: 18 mg/dL (ref 5–40)

## 2023-02-19 MED ORDER — LISINOPRIL 20 MG PO TABS
20.0000 mg | ORAL_TABLET | Freq: Every day | ORAL | 1 refills | Status: AC
Start: 2023-02-19 — End: ?

## 2023-02-19 MED ORDER — LORAZEPAM 0.5 MG PO TABS
0.5000 mg | ORAL_TABLET | Freq: Two times a day (BID) | ORAL | 5 refills | Status: AC | PRN
Start: 2023-02-19 — End: ?

## 2023-02-19 MED ORDER — HYDROCHLOROTHIAZIDE 25 MG PO TABS: 25.0000 mg | ORAL_TABLET | Freq: Every day | ORAL | 1 refills | Status: DC

## 2023-02-19 MED ORDER — METOPROLOL TARTRATE 25 MG PO TABS: 12.5000 mg | ORAL_TABLET | Freq: Two times a day (BID) | ORAL | 1 refills | Status: DC

## 2023-02-19 MED ORDER — ATORVASTATIN CALCIUM 10 MG PO TABS
10.0000 mg | ORAL_TABLET | Freq: Every day | ORAL | 1 refills | Status: AC
Start: 2023-02-19 — End: ?

## 2023-02-19 NOTE — Progress Notes (Addendum)
Subjective:    Patient ID: Toni Cooper, female    DOB: 1946/08/25, 77 y.o.   MRN: [id number]   Chief Complaint: medical management of chronic issues     HPI:  Toni Cooper is a 77 y.o. who identifies as a female who was assigned female at birth.   Social history: Lives with: husband Work history: retired   Scientist, forensic in today for follow up of the following chronic medical issues:  1. Primary hypertension No c/o chest pain, sob or headache. Does not check blood pressure at home. BP Readings from Last 3 Encounters:  01/02/23 130/68  09/11/22 132/72  04/05/22 134/70     2. Type 2 diabetes mellitus without complication, without long-term current use of insulin Fasting blood sugars are running around 120--130. No low blodd sugars. Sees DR. Balan. Lab Results  Component Value Date   HGBA1C 6.9 (H) 09/11/2022     3. Hyperlipidemia associated with type 2 diabetes mellitus Does not watch diet and does no dedicated exercise. Lab Results  Component Value Date   CHOL 100 09/11/2022   HDL 36 (L) 09/11/2022   LDLCALC 44 09/11/2022   TRIG 104 09/11/2022   CHOLHDL 2.8 09/11/2022     4. Hereditary and idiopathic neuropathy, unspecified Is not bad enough to take any meds.  5. Age-related osteoporosis without current pathological fracture Last dexascan was done in 2018. She said she had on in 2024 in [place]. T score was -1.7.   6. GAd Ativan only as needed    02/19/2023    8:15 AM 09/11/2022    8:00 AM 04/05/2022    8:09 AM 10/06/2021    8:12 AM  GAD 7 : Generalized Anxiety Score  Nervous, Anxious, on Edge 0 0 0 0  Control/stop worrying 0 0 0 0  Worry too much - different things 0 0 0 0  Trouble relaxing 0 0 0 0  Restless 0 0 0 0  Easily annoyed or irritable 0 0 0 0  Afraid - awful might happen 0 0 0 0  Total GAD 7 Score 0 0 0 0  Anxiety Difficulty Not difficult at all Not difficult at all Not difficult at all Not difficult at all      7. Class 2 severe obesity  due to excess calories with serious comorbidity and body mass index (BMI) of 38.0 to 38.9 in adult No recent weight changes. Wt Readings from Last 3 Encounters:  02/19/23 200 lb (90.7 kg)  01/02/23 200 lb (90.7 kg)  09/11/22 204 lb (92.5 kg)   BMI Readings from Last 3 Encounters:  02/19/23 34.33 kg/m  01/02/23 34.33 kg/m  09/11/22 35.02 kg/m     New complaints: None today  Allergies  Allergen Reactions   Sulfa Antibiotics Nausea And Vomiting   Alendronate Other (See Comments)   Sulfamethoxazole-Trimethoprim Other (See Comments)   Outpatient Encounter Medications as of 02/19/2023  Medication Sig   acetaminophen (TYLENOL) 500 MG tablet Take 500 mg 2 (two) times daily as needed by mouth for moderate pain or headache.    Apoaequorin (PREVAGEN PO) Take by mouth.   aspirin 81 MG EC tablet Take 1 tablet (81 mg total) by mouth daily. (Patient taking differently: Take 81 mg by mouth in the morning and at bedtime.)   atorvastatin (LIPITOR) 10 MG tablet Take 1 tablet (10 mg total) by mouth daily.   beta carotene w/minerals (OCUVITE) tablet Take 1 tablet by mouth daily.   calcium citrate (CALCITRATE - DOSED  IN MG ELEMENTAL CALCIUM) 950 (200 Ca) MG tablet 1 tablet   cholecalciferol (VITAMIN D) 1000 UNITS tablet Take 1,000 Units by mouth daily.   denosumab (PROLIA) 60 MG/ML SOSY injection Inject 60 mg into the skin every 6 (six) months.   ferrous sulfate 324 MG TBEC 1 tablet   fluticasone (FLONASE) 50 MCG/ACT nasal spray 1 spray as needed.   hydrochlorothiazide (HYDRODIURIL) 25 MG tablet Take 1 tablet (25 mg total) by mouth daily.   LANTUS 100 UNIT/ML injection Inject 35 Units into the skin at bedtime.    lisinopril (ZESTRIL) 20 MG tablet Take 1 tablet (20 mg total) by mouth daily.   LORazepam (ATIVAN) 0.5 MG tablet Take 1 tablet (0.5 mg total) by mouth 2 (two) times daily as needed for anxiety.   Magnesium 250 MG TABS Take 250 mg by mouth daily.    metFORMIN (GLUCOPHAGE-XR) 500 MG 24 hr  tablet Take 1 tablet (500 mg total) by mouth 2 (two) times daily.   metoprolol tartrate (LOPRESSOR) 25 MG tablet Take 0.5 tablets (12.5 mg total) by mouth 2 (two) times daily.   Multiple Vitamins-Minerals (MULTI-BETIC DIABETES) TABS Take 1 tablet daily by mouth.   NOVOLOG 100 UNIT/ML injection Inject into the skin.   pyridOXINE (VITAMIN B-6) 100 MG tablet Take 100 mg by mouth daily.   Semaglutide,0.25 or 0.5MG /DOS, (OZEMPIC, 0.25 OR 0.5 MG/DOSE,) 2 MG/1.5ML SOPN Inject 0.5 mg into the skin once a week. (Patient taking differently: Inject 0.25 mg into the skin once a week.)   vitamin C (ASCORBIC ACID) 500 MG tablet Take 500 mg by mouth daily.   No facility-administered encounter medications on file as of 02/19/2023.    Past Surgical History:  Procedure Laterality Date   APPENDECTOMY     FEMUR FRACTURE SURGERY  07/30/2020   FRACTURE SURGERY     shoulder   TEE WITHOUT CARDIOVERSION N/A 04/18/2016   Procedure: TRANSESOPHAGEAL ECHOCARDIOGRAM (TEE);  Surgeon: Thayer Headings, MD;  Location: [place];  Service: Cardiovascular;  Laterality: N/A;   TEE WITHOUT CARDIOVERSION N/A 10/16/2017   Procedure: TRANSESOPHAGEAL ECHOCARDIOGRAM (TEE);  Surgeon: Rexene Alberts, MD;  Location: [place];  Service: Open Heart Surgery;  Laterality: N/A;   TOTAL ABDOMINAL HYSTERECTOMY     WRIST SURGERY      Family History  Problem Relation Age of Onset   Stroke Father 43   Diabetes Father    Retinopathy of prematurity Father    Heart failure Mother    Heart attack Maternal Grandmother    Heart attack Paternal Grandmother    Diabetes Paternal Aunt    Diabetes Paternal Uncle       Controlled substance contract: n/a     Review of Systems  Constitutional:  Negative for diaphoresis.  Eyes:  Negative for pain.  Respiratory:  Negative for shortness of breath.   Cardiovascular:  Negative for chest pain, palpitations and leg swelling.  Gastrointestinal:  Negative for abdominal pain.  Endocrine: Negative  for polydipsia.  Skin:  Negative for rash.  Neurological:  Negative for dizziness, weakness and headaches.  Hematological:  Does not bruise/bleed easily.  All other systems reviewed and are negative.      Objective:   Physical Exam Vitals and nursing note reviewed.  Constitutional:      General: She is not in acute distress.    Appearance: Normal appearance. She is well-developed.  HENT:     Head: Normocephalic.     Right Ear: Tympanic membrane normal.  Left Ear: Tympanic membrane normal.     Nose: Nose normal.     Mouth/Throat:     Mouth: Mucous membranes are moist.  Eyes:     Pupils: Pupils are equal, round, and reactive to light.  Neck:     Vascular: No carotid bruit or JVD.  Cardiovascular:     Rate and Rhythm: Normal rate and regular rhythm.     Heart sounds: Murmur (3/6) heard.  Pulmonary:     Effort: Pulmonary effort is normal. No respiratory distress.     Breath sounds: Normal breath sounds. No wheezing or rales.  Chest:     Chest wall: No tenderness.  Abdominal:     General: Bowel sounds are normal. There is no distension or abdominal bruit.     Palpations: Abdomen is soft. There is no hepatomegaly, splenomegaly, mass or pulsatile mass.     Tenderness: There is no abdominal tenderness.  Musculoskeletal:        General: Normal range of motion.     Cervical back: Normal range of motion and neck supple.  Lymphadenopathy:     Cervical: No cervical adenopathy.  Skin:    General: Skin is warm and dry.  Neurological:     Mental Status: She is alert and oriented to person, place, and time.     Deep Tendon Reflexes: Reflexes are normal and symmetric.  Psychiatric:        Behavior: Behavior normal.        Thought Content: Thought content normal.        Judgment: Judgment normal.     BP (!) 141/74   Pulse 67   Temp (!) 97.2 F (36.2 C) (Temporal)   Resp 20   Ht 5\' 4"  (1.626 m)   Wt 200 lb (90.7 kg)   SpO2 99%   BMI 34.33 kg/m   Hgba1c 6.8       Assessment & Plan:  [name] in today with chief complaint of Medical Management of Chronic Issues   1. Primary hypertension Low sodium diet - CBC with Differential/Platelet - CMP14+EGFR - hydrochlorothiazide (HYDRODIURIL) 25 MG tablet; Take 1 tablet (25 mg total) by mouth daily.  Dispense: 90 tablet; Refill: 1 - lisinopril (ZESTRIL) 20 MG tablet; Take 1 tablet (20 mg total) by mouth daily.  Dispense: 90 tablet; Refill: 1  2. Type 2 diabetes mellitus without complication, without long-term current use of insulin Continue follow up with DR. Balan - Bayer DCA Hb A1c Waived  3. Hyperlipidemia associated with type 2 diabetes mellitus Low fat diet - Lipid panel - atorvastatin (LIPITOR) 10 MG tablet; Take 1 tablet (10 mg total) by mouth daily.  Dispense: 90 tablet; Refill: 1  4. Hereditary and idiopathic neuropathy, unspecified Do not go barefooted Continue monthly pedicures  5. Age-related osteoporosis without current pathological fracture Weight bearing exercises encouraged  6. Class 2 severe obesity due to excess calories with serious comorbidity and body mass index (BMI) of 38.0 to 38.9 in adult Discussed diet and exercise for person with BMI >25 Will recheck weight in 3-6 months   7. Anxiety Stress management - LORazepam (ATIVAN) 0.5 MG tablet; Take 1 tablet (0.5 mg total) by mouth 2 (two) times daily as needed for anxiety.  Dispense: 60 tablet; Refill: 5   8. S/P minimally invasive aortic valve replacement with bioprosthetic valve Keep follow up with cardiology - metoprolol tartrate (LOPRESSOR) 25 MG tablet; Take 0.5 tablets (12.5 mg total) by mouth 2 (two) times daily.  Dispense:  90 tablet; Refill: 1    The above assessment and management plan was discussed with the patient. The patient verbalized understanding of and has agreed to the management plan. Patient is aware to call the clinic if symptoms persist or worsen. Patient is aware when to return to the clinic for a  follow-up visit. Patient educated on when it is appropriate to go to the emergency department.   Mary-Margaret Hassell Done, FNP

## 2023-02-19 NOTE — Patient Instructions (Signed)

## 2023-02-28 DIAGNOSIS — E1165 Type 2 diabetes mellitus with hyperglycemia: Secondary | ICD-10-CM | POA: Diagnosis not present

## 2023-02-28 DIAGNOSIS — E7800 Pure hypercholesterolemia, unspecified: Secondary | ICD-10-CM | POA: Diagnosis not present

## 2023-02-28 DIAGNOSIS — G609 Hereditary and idiopathic neuropathy, unspecified: Secondary | ICD-10-CM | POA: Diagnosis not present

## 2023-02-28 DIAGNOSIS — E559 Vitamin D deficiency, unspecified: Secondary | ICD-10-CM | POA: Diagnosis not present

## 2023-02-28 DIAGNOSIS — M899 Disorder of bone, unspecified: Secondary | ICD-10-CM | POA: Diagnosis not present

## 2023-02-28 DIAGNOSIS — I10 Essential (primary) hypertension: Secondary | ICD-10-CM | POA: Diagnosis not present

## 2023-03-03 DIAGNOSIS — E1165 Type 2 diabetes mellitus with hyperglycemia: Secondary | ICD-10-CM | POA: Diagnosis not present

## 2023-03-14 ENCOUNTER — Ambulatory Visit: Payer: Medicare PPO | Admitting: Nurse Practitioner

## 2023-04-02 DIAGNOSIS — E1165 Type 2 diabetes mellitus with hyperglycemia: Secondary | ICD-10-CM | POA: Diagnosis not present

## 2023-04-05 ENCOUNTER — Telehealth: Payer: Self-pay | Admitting: Nurse Practitioner

## 2023-05-03 DIAGNOSIS — E1165 Type 2 diabetes mellitus with hyperglycemia: Secondary | ICD-10-CM | POA: Diagnosis not present

## 2023-06-02 DIAGNOSIS — E1165 Type 2 diabetes mellitus with hyperglycemia: Secondary | ICD-10-CM | POA: Diagnosis not present

## 2023-06-14 DIAGNOSIS — Z1231 Encounter for screening mammogram for malignant neoplasm of breast: Secondary | ICD-10-CM | POA: Diagnosis not present

## 2023-06-17 DIAGNOSIS — M810 Age-related osteoporosis without current pathological fracture: Secondary | ICD-10-CM | POA: Diagnosis not present

## 2023-06-18 ENCOUNTER — Ambulatory Visit: Payer: Self-pay | Admitting: Nurse Practitioner

## 2023-06-18 ENCOUNTER — Encounter: Payer: Self-pay | Admitting: Nurse Practitioner

## 2023-07-03 DIAGNOSIS — E1165 Type 2 diabetes mellitus with hyperglycemia: Secondary | ICD-10-CM | POA: Diagnosis not present

## 2023-07-23 ENCOUNTER — Encounter: Payer: Self-pay | Admitting: *Deleted

## 2023-08-03 DIAGNOSIS — E1165 Type 2 diabetes mellitus with hyperglycemia: Secondary | ICD-10-CM | POA: Diagnosis not present

## 2023-08-15 LAB — HM DEXA SCAN

## 2023-08-15 LAB — HM MAMMOGRAPHY

## 2023-08-20 ENCOUNTER — Encounter: Payer: Self-pay | Admitting: Nurse Practitioner

## 2023-08-20 ENCOUNTER — Other Ambulatory Visit: Payer: Medicare PPO

## 2023-08-20 ENCOUNTER — Ambulatory Visit: Payer: Medicare PPO | Admitting: Nurse Practitioner

## 2023-08-20 VITALS — BP 109/60 | HR 76 | Temp 98.0°F | Resp 20 | Ht 64.0 in | Wt 194.0 lb

## 2023-08-20 DIAGNOSIS — M810 Age-related osteoporosis without current pathological fracture: Secondary | ICD-10-CM

## 2023-08-20 DIAGNOSIS — Z953 Presence of xenogenic heart valve: Secondary | ICD-10-CM | POA: Diagnosis not present

## 2023-08-20 DIAGNOSIS — Z7985 Long-term (current) use of injectable non-insulin antidiabetic drugs: Secondary | ICD-10-CM

## 2023-08-20 DIAGNOSIS — Z7984 Long term (current) use of oral hypoglycemic drugs: Secondary | ICD-10-CM

## 2023-08-20 DIAGNOSIS — E1169 Type 2 diabetes mellitus with other specified complication: Secondary | ICD-10-CM | POA: Diagnosis not present

## 2023-08-20 DIAGNOSIS — E119 Type 2 diabetes mellitus without complications: Secondary | ICD-10-CM | POA: Diagnosis not present

## 2023-08-20 DIAGNOSIS — I10 Essential (primary) hypertension: Secondary | ICD-10-CM

## 2023-08-20 DIAGNOSIS — E785 Hyperlipidemia, unspecified: Secondary | ICD-10-CM

## 2023-08-20 DIAGNOSIS — F419 Anxiety disorder, unspecified: Secondary | ICD-10-CM

## 2023-08-20 DIAGNOSIS — E66812 Obesity, class 2: Secondary | ICD-10-CM | POA: Diagnosis not present

## 2023-08-20 DIAGNOSIS — Z6838 Body mass index (BMI) 38.0-38.9, adult: Secondary | ICD-10-CM

## 2023-08-20 LAB — BAYER DCA HB A1C WAIVED: HB A1C (BAYER DCA - WAIVED): 6.3 % — ABNORMAL HIGH (ref 4.8–5.6)

## 2023-08-20 LAB — LIPID PANEL

## 2023-08-20 MED ORDER — METFORMIN HCL ER 500 MG PO TB24: 500.0000 mg | ORAL_TABLET | Freq: Two times a day (BID) | ORAL | 1 refills | Status: DC

## 2023-08-20 MED ORDER — HYDROCHLOROTHIAZIDE 25 MG PO TABS
25.0000 mg | ORAL_TABLET | Freq: Every day | ORAL | 1 refills | Status: DC
Start: 2023-08-20 — End: 2023-11-22

## 2023-08-20 MED ORDER — LISINOPRIL 20 MG PO TABS: 20.0000 mg | ORAL_TABLET | Freq: Every day | ORAL | 1 refills | Status: DC

## 2023-08-20 MED ORDER — ATORVASTATIN CALCIUM 10 MG PO TABS: 10.0000 mg | ORAL_TABLET | Freq: Every day | ORAL | 1 refills | Status: DC

## 2023-08-20 MED ORDER — METOPROLOL TARTRATE 25 MG PO TABS
12.5000 mg | ORAL_TABLET | Freq: Two times a day (BID) | ORAL | 1 refills | Status: DC
Start: 2023-08-20 — End: 2023-11-22

## 2023-08-20 MED ORDER — LORAZEPAM 0.5 MG PO TABS: 0.5000 mg | ORAL_TABLET | Freq: Two times a day (BID) | ORAL | 5 refills | Status: DC | PRN

## 2023-08-20 NOTE — Progress Notes (Signed)
Subjective:    Patient ID: Toni Cooper, female    DOB: Apr 15, 1946, 77 y.o.   MRN: [id number]   Chief Complaint: medical management of chronic issues     HPI:  Toni Cooper is a 77 y.o. who identifies as a female who was assigned female at birth.   Social history: Lives with: husband Work history: retired   Water engineer in today for follow up of the following chronic medical issues:  1. Primary hypertension No c/o chest pain, sob or headache. Does not check blood pressure at home. BP Readings from Last 3 Encounters:  02/19/23 (!) 141/74  01/02/23 130/68  09/11/22 132/72     2. Diabetes mellitus treated with oral medication (HCC ) 3. Diabetes mellitus treated with injections of non-insulin medication (HCC) Fasting blood sugars are running around 110-150. Lab Results  Component Value Date   HGBA1C 6.8 (H) 02/19/2023     4. Hyperlipidemia associated with type 2 diabetes mellitus (HCC) Does not watch diet very closely but does no dedicated exercise. Lab Results  Component Value Date   CHOL 106 02/19/2023   HDL 41 02/19/2023   LDLCALC 47 02/19/2023   TRIG 93 02/19/2023   CHOLHDL 2.6 02/19/2023     5. Age-related osteoporosis without current pathological fracture Last dexascan was done on 11/28/16. P[place]ing no longer wants to do these.  6. Class 2 severe obesity due to excess calories with serious comorbidity and body mass index (BMI) of 38.0 to 38.9 in adult [facility]) Weight is down 6lbs Wt Readings from Last 3 Encounters:  08/20/23 194 lb (88 kg)  02/19/23 200 lb (90.7 kg)  01/02/23 200 lb (90.7 kg)   BMI Readings from Last 3 Encounters:  08/20/23 33.30 kg/m  02/19/23 34.33 kg/m  01/02/23 34.33 kg/m      New complaints: None today  Allergies  Allergen Reactions   Sulfa Antibiotics Nausea And Vomiting   Alendronate Other (See Comments)   Sulfamethoxazole-Trimethoprim Other (See Comments)   Outpatient Encounter Medications as of 08/20/2023  Medication Sig    acetaminophen (TYLENOL) 500 MG tablet Take 500 mg 2 (two) times daily as needed by mouth for moderate pain or headache.    Apoaequorin (PREVAGEN PO) Take by mouth.   aspirin 81 MG EC tablet Take 1 tablet (81 mg total) by mouth daily. (Patient taking differently: Take 81 mg by mouth in the morning and at bedtime.)   atorvastatin (LIPITOR) 10 MG tablet Take 1 tablet (10 mg total) by mouth daily.   beta carotene w/minerals (OCUVITE) tablet Take 1 tablet by mouth daily.   calcium citrate (CALCITRATE - DOSED IN MG ELEMENTAL CALCIUM) 950 (200 Ca) MG tablet 1 tablet   cholecalciferol (VITAMIN D) 1000 UNITS tablet Take 1,000 Units by mouth daily.   denosumab (PROLIA) 60 MG/ML SOSY injection Inject 60 mg into the skin every 6 (six) months.   ferrous sulfate 324 MG TBEC 1 tablet   fluticasone (FLONASE) 50 MCG/ACT nasal spray 1 spray as needed.   hydrochlorothiazide (HYDRODIURIL) 25 MG tablet Take 1 tablet (25 mg total) by mouth daily.   LANTUS 100 UNIT/ML injection Inject 35 Units into the skin at bedtime.    lisinopril (ZESTRIL) 20 MG tablet Take 1 tablet (20 mg total) by mouth daily.   LORazepam (ATIVAN) 0.5 MG tablet Take 1 tablet (0.5 mg total) by mouth 2 (two) times daily as needed for anxiety.   Magnesium 250 MG TABS Take 250 mg by mouth daily.    metFORMIN (  GLUCOPHAGE-XR) 500 MG 24 hr tablet Take 1 tablet (500 mg total) by mouth 2 (two) times daily.   metoprolol tartrate (LOPRESSOR) 25 MG tablet Take 0.5 tablets (12.5 mg total) by mouth 2 (two) times daily.   Multiple Vitamins-Minerals (MULTI-BETIC DIABETES) TABS Take 1 tablet daily by mouth.   NOVOLOG 100 UNIT/ML injection Inject into the skin.   pyridOXINE (VITAMIN B-6) 100 MG tablet Take 100 mg by mouth daily.   Semaglutide,0.25 or 0.5MG /DOS, (OZEMPIC, 0.25 OR 0.5 MG/DOSE,) 2 MG/1.5ML SOPN Inject 0.5 mg into the skin once a week. (Patient taking differently: Inject 0.25 mg into the skin once a week.)   vitamin C (ASCORBIC ACID) 500 MG tablet  Take 500 mg by mouth daily.   No facility-administered encounter medications on file as of 08/20/2023.    Past Surgical History:  Procedure Laterality Date   APPENDECTOMY     FEMUR FRACTURE SURGERY  07/30/2020   FRACTURE SURGERY     shoulder   TEE WITHOUT CARDIOVERSION N/A 04/18/2016   Procedure: TRANSESOPHAGEAL ECHOCARDIOGRAM (TEE);  Surgeon: Vesta Mixer, MD;  Location: [facility] ENDOSCOPY;  Service: Cardiovascular;  Laterality: N/A;   TEE WITHOUT CARDIOVERSION N/A 10/16/2017   Procedure: TRANSESOPHAGEAL ECHOCARDIOGRAM (TEE);  Surgeon: Purcell Nails, MD;  Location: [facility] OR;  Service: Open Heart Surgery;  Laterality: N/A;   TOTAL ABDOMINAL HYSTERECTOMY     WRIST SURGERY      Family History  Problem Relation Age of Onset   Stroke Father 67   Diabetes Father    Retinopathy of prematurity Father    Heart failure Mother    Heart attack Maternal Grandmother    Heart attack Paternal Grandmother    Diabetes Paternal Aunt    Diabetes Paternal Uncle       Controlled substance contract: n/a     Review of Systems  Constitutional:  Negative for diaphoresis.  Eyes:  Negative for pain.  Respiratory:  Negative for shortness of breath.   Cardiovascular:  Negative for chest pain, palpitations and leg swelling.  Gastrointestinal:  Negative for abdominal pain.  Endocrine: Negative for polydipsia.  Skin:  Negative for rash.  Neurological:  Negative for dizziness, weakness and headaches.  Hematological:  Does not bruise/bleed easily.  All other systems reviewed and are negative.      Objective:   Physical Exam Vitals and nursing note reviewed.  Constitutional:      General: She is not in acute distress.    Appearance: Normal appearance. She is well-developed.  HENT:     Head: Normocephalic.     Right Ear: Tympanic membrane normal.     Left Ear: Tympanic membrane normal.     Nose: Nose normal.     Mouth/Throat:     Mouth: Mucous membranes are moist.  Eyes:     Pupils: Pupils  are equal, round, and reactive to light.  Neck:     Vascular: No carotid bruit or JVD.  Cardiovascular:     Rate and Rhythm: Normal rate and regular rhythm.     Heart sounds: Normal heart sounds.  Pulmonary:     Effort: Pulmonary effort is normal. No respiratory distress.     Breath sounds: Normal breath sounds. No wheezing or rales.  Chest:     Chest wall: No tenderness.  Abdominal:     General: Bowel sounds are normal. There is no distension or abdominal bruit.     Palpations: Abdomen is soft. There is no hepatomegaly, splenomegaly, mass or pulsatile mass.  Tenderness: There is no abdominal tenderness.  Musculoskeletal:        General: Normal range of motion.     Cervical back: Normal range of motion and neck supple.  Lymphadenopathy:     Cervical: No cervical adenopathy.  Skin:    General: Skin is warm and dry.  Neurological:     Mental Status: She is alert and oriented to person, place, and time.     Deep Tendon Reflexes: Reflexes are normal and symmetric.  Psychiatric:        Behavior: Behavior normal.        Thought Content: Thought content normal.        Judgment: Judgment normal.     BP 109/60   Pulse 76   Temp 98 F (36.7 C) (Temporal)   Resp 20   Ht 5\' 4"  (1.626 m)   Wt 194 lb (88 kg)   SpO2 97%   BMI 33.30 kg/m  HGBA1c 6.3%     Assessment & Plan:   BONNYE HALLE comes in today with chief complaint of Medical Management of Chronic Issues   Diagnosis and orders addressed:  1. Primary hypertension Low sodium diet - CBC with Differential/Platelet - CMP14+EGFR - hydrochlorothiazide (HYDRODIURIL) 25 MG tablet; Take 1 tablet (25 mg total) by mouth daily.  Dispense: 90 tablet; Refill: 1 - lisinopril (ZESTRIL) 20 MG tablet; Take 1 tablet (20 mg total) by mouth daily.  Dispense: 90 tablet; Refill: 1  2. Diabetes mellitus treated with oral medication (HCC) Continue to wtatch carbs in diet - Bayer DCA Hb A1c Waived - Microalbumin / creatinine urine  ratio  3. Diabetes mellitus treated with injections of non-insulin medication (HCC) - metFORMIN (GLUCOPHAGE-XR) 500 MG 24 hr tablet; Take 1 tablet (500 mg total) by mouth 2 (two) times daily.  Dispense: 180 tablet; Refill: 1  4. Hyperlipidemia associated with type 2 diabetes mellitus (HCC) Low fat diet - Lipid panel - atorvastatin (LIPITOR) 10 MG tablet; Take 1 tablet (10 mg total) by mouth daily.  Dispense: 90 tablet; Refill: 1  5. Age-related osteoporosis without current pathological fracture Weight bearing exercises  6. Class 2 severe obesity due to excess calories with serious comorbidity and body mass index (BMI) of 38.0 to 38.9 in adult [facility]) Discussed diet and exercise for person with BMI >25 Will recheck weight in 3-6 months   7. Anxiety Stress management - ToxASSURE Select 13 (MW), Urine - LORazepam (ATIVAN) 0.5 MG tablet; Take 1 tablet (0.5 mg total) by mouth 2 (two) times daily as needed for anxiety.  Dispense: 60 tablet; Refill: 5  8. S/P minimally invasive aortic valve replacement with bioprosthetic valve Keep follow up with cardiology - metoprolol tartrate (LOPRESSOR) 25 MG tablet; Take 0.5 tablets (12.5 mg total) by mouth 2 (two) times daily.  Dispense: 90 tablet; Refill: 1   Labs pending Health Maintenance reviewed Diet and exercise encouraged  Follow up plan: 6 months   Mary-Margaret Daphine Deutscher, FNP

## 2023-08-20 NOTE — Patient Instructions (Signed)

## 2023-08-21 LAB — CMP14+EGFR
ALT: 19 IU/L (ref 0–32)
AST: 22 IU/L (ref 0–40)
Albumin: 3.9 g/dL (ref 3.8–4.8)
Alkaline Phosphatase: 56 IU/L (ref 44–121)
BUN/Creatinine Ratio: 28 (ref 12–28)
BUN: 27 mg/dL (ref 8–27)
Bilirubin Total: 0.4 mg/dL (ref 0.0–1.2)
CO2: 26 mmol/L (ref 20–29)
Calcium: 10.5 mg/dL — ABNORMAL HIGH (ref 8.7–10.3)
Chloride: 102 mmol/L (ref 96–106)
Creatinine, Ser: 0.98 mg/dL (ref 0.57–1.00)
Globulin, Total: 2.5 g/dL (ref 1.5–4.5)
Glucose: 88 mg/dL (ref 70–99)
Potassium: 4.2 mmol/L (ref 3.5–5.2)
Sodium: 143 mmol/L (ref 134–144)
Total Protein: 6.4 g/dL (ref 6.0–8.5)
eGFR: 59 mL/min/{1.73_m2} — ABNORMAL LOW (ref >59)

## 2023-08-21 LAB — CBC WITH DIFFERENTIAL/PLATELET
Basophils Absolute: 0.1 10*3/uL (ref 0.0–0.2)
Basos: 1 %
EOS (ABSOLUTE): 0.2 10*3/uL (ref 0.0–0.4)
Eos: 3 %
Hematocrit: 39.9 % (ref 34.0–46.6)
Hemoglobin: 12.7 g/dL (ref 11.1–15.9)
Immature Grans (Abs): 0 10*3/uL (ref 0.0–0.1)
Immature Granulocytes: 0 %
Lymphocytes Absolute: 2.6 10*3/uL (ref 0.7–3.1)
Lymphs: 28 %
MCH: 30.7 pg (ref 26.6–33.0)
MCHC: 31.8 g/dL (ref 31.5–35.7)
MCV: 96 fL (ref 79–97)
Monocytes Absolute: 0.8 10*3/uL (ref 0.1–0.9)
Monocytes: 9 %
Neutrophils Absolute: 5.5 10*3/uL (ref 1.4–7.0)
Neutrophils: 59 %
Platelets: 241 10*3/uL (ref 150–450)
RBC: 4.14 x10E6/uL (ref 3.77–5.28)
RDW: 12.8 % (ref 11.7–15.4)
WBC: 9.1 10*3/uL (ref 3.4–10.8)

## 2023-08-21 LAB — MICROALBUMIN / CREATININE URINE RATIO
Creatinine, Urine: 114 mg/dL
Microalb/Creat Ratio: 5 mg/g{creat} (ref 0–29)
Microalbumin, Urine: 5.7 ug/mL

## 2023-08-21 LAB — LIPID PANEL
Cholesterol, Total: 103 mg/dL (ref 100–199)
HDL: 36 mg/dL — ABNORMAL LOW (ref >39)
LDL CALC COMMENT:: 2.9 ratio (ref 0.0–4.4)
LDL Chol Calc (NIH): 48 mg/dL (ref 0–99)
Triglycerides: 99 mg/dL (ref 0–149)
VLDL Cholesterol Cal: 19 mg/dL (ref 5–40)

## 2023-08-22 ENCOUNTER — Ambulatory Visit: Payer: Medicare PPO | Admitting: Nurse Practitioner

## 2023-08-23 LAB — TOXASSURE SELECT 13 (MW), URINE

## 2023-08-28 ENCOUNTER — Encounter: Payer: Self-pay | Admitting: Nurse Practitioner

## 2023-08-28 ENCOUNTER — Ambulatory Visit: Payer: Medicare PPO

## 2023-09-02 DIAGNOSIS — E1165 Type 2 diabetes mellitus with hyperglycemia: Secondary | ICD-10-CM | POA: Diagnosis not present

## 2023-09-03 ENCOUNTER — Ambulatory Visit (INDEPENDENT_AMBULATORY_CARE_PROVIDER_SITE_OTHER): Payer: Medicare PPO

## 2023-09-03 DIAGNOSIS — Z23 Encounter for immunization: Secondary | ICD-10-CM | POA: Diagnosis not present

## 2023-10-03 DIAGNOSIS — E1165 Type 2 diabetes mellitus with hyperglycemia: Secondary | ICD-10-CM | POA: Diagnosis not present

## 2023-10-04 DIAGNOSIS — E1165 Type 2 diabetes mellitus with hyperglycemia: Secondary | ICD-10-CM | POA: Diagnosis not present

## 2023-10-04 DIAGNOSIS — I10 Essential (primary) hypertension: Secondary | ICD-10-CM | POA: Diagnosis not present

## 2023-10-04 DIAGNOSIS — E559 Vitamin D deficiency, unspecified: Secondary | ICD-10-CM | POA: Diagnosis not present

## 2023-10-04 DIAGNOSIS — E7800 Pure hypercholesterolemia, unspecified: Secondary | ICD-10-CM | POA: Diagnosis not present

## 2023-10-04 DIAGNOSIS — M899 Disorder of bone, unspecified: Secondary | ICD-10-CM | POA: Diagnosis not present

## 2023-10-04 DIAGNOSIS — G609 Hereditary and idiopathic neuropathy, unspecified: Secondary | ICD-10-CM | POA: Diagnosis not present

## 2023-11-02 DIAGNOSIS — E1165 Type 2 diabetes mellitus with hyperglycemia: Secondary | ICD-10-CM | POA: Diagnosis not present

## 2023-11-22 ENCOUNTER — Encounter: Payer: Self-pay | Admitting: Nurse Practitioner

## 2023-11-22 ENCOUNTER — Ambulatory Visit: Payer: Medicare PPO | Admitting: Nurse Practitioner

## 2023-11-22 VITALS — BP 137/69 | HR 80 | Temp 98.4°F | Resp 20 | Ht 64.0 in | Wt 196.0 lb

## 2023-11-22 DIAGNOSIS — F419 Anxiety disorder, unspecified: Secondary | ICD-10-CM

## 2023-11-22 DIAGNOSIS — E1140 Type 2 diabetes mellitus with diabetic neuropathy, unspecified: Secondary | ICD-10-CM

## 2023-11-22 DIAGNOSIS — Z794 Long term (current) use of insulin: Secondary | ICD-10-CM

## 2023-11-22 DIAGNOSIS — Z7985 Long-term (current) use of injectable non-insulin antidiabetic drugs: Secondary | ICD-10-CM

## 2023-11-22 DIAGNOSIS — Z953 Presence of xenogenic heart valve: Secondary | ICD-10-CM

## 2023-11-22 DIAGNOSIS — M810 Age-related osteoporosis without current pathological fracture: Secondary | ICD-10-CM

## 2023-11-22 DIAGNOSIS — Z6838 Body mass index (BMI) 38.0-38.9, adult: Secondary | ICD-10-CM

## 2023-11-22 DIAGNOSIS — I10 Essential (primary) hypertension: Secondary | ICD-10-CM

## 2023-11-22 DIAGNOSIS — E1169 Type 2 diabetes mellitus with other specified complication: Secondary | ICD-10-CM | POA: Diagnosis not present

## 2023-11-22 DIAGNOSIS — E559 Vitamin D deficiency, unspecified: Secondary | ICD-10-CM

## 2023-11-22 DIAGNOSIS — E119 Type 2 diabetes mellitus without complications: Secondary | ICD-10-CM | POA: Insufficient documentation

## 2023-11-22 DIAGNOSIS — Z7984 Long term (current) use of oral hypoglycemic drugs: Secondary | ICD-10-CM

## 2023-11-22 DIAGNOSIS — E785 Hyperlipidemia, unspecified: Secondary | ICD-10-CM | POA: Diagnosis not present

## 2023-11-22 LAB — BAYER DCA HB A1C WAIVED: HB A1C (BAYER DCA - WAIVED): 6.3 % — ABNORMAL HIGH (ref 4.8–5.6)

## 2023-11-22 LAB — LIPID PANEL

## 2023-11-22 MED ORDER — METFORMIN HCL ER 500 MG PO TB24: 500.0000 mg | ORAL_TABLET | Freq: Two times a day (BID) | ORAL | 1 refills | Status: AC

## 2023-11-22 MED ORDER — METOPROLOL TARTRATE 25 MG PO TABS: 12.5000 mg | ORAL_TABLET | Freq: Two times a day (BID) | ORAL | 1 refills | Status: DC

## 2023-11-22 MED ORDER — ATORVASTATIN CALCIUM 10 MG PO TABS: 10.0000 mg | ORAL_TABLET | Freq: Every day | ORAL | 1 refills | Status: DC

## 2023-11-22 MED ORDER — HYDROCHLOROTHIAZIDE 25 MG PO TABS: 25.0000 mg | ORAL_TABLET | Freq: Every day | ORAL | 1 refills | Status: DC

## 2023-11-22 MED ORDER — OZEMPIC (0.25 OR 0.5 MG/DOSE) 2 MG/1.5ML ~~LOC~~ SOPN: 0.5000 mg | PEN_INJECTOR | SUBCUTANEOUS | 1 refills | Status: AC

## 2023-11-22 MED ORDER — LISINOPRIL 20 MG PO TABS: 20.0000 mg | ORAL_TABLET | Freq: Every day | ORAL | 1 refills | Status: DC

## 2023-11-22 MED ORDER — LORAZEPAM 0.5 MG PO TABS: 0.5000 mg | ORAL_TABLET | Freq: Two times a day (BID) | ORAL | 5 refills | Status: DC | PRN

## 2023-11-22 NOTE — Patient Instructions (Signed)
 Bone Health Bones protect organs, store calcium, anchor muscles, and support the whole body. Keeping your bones strong is important, especially as you get older. You can take actions to help keep your bones strong and healthy. Why is keeping my bones healthy important?  Keeping your bones healthy is important because your body constantly replaces bone cells. Cells get old, and new cells take their place. As we age, we lose bone cells because the body may not be able to make enough new cells to replace the old cells. The amount of bone cells and bone tissue you have is referred to as bone mass. The higher your bone mass, the stronger your bones. The aging process leads to an overall loss of bone mass in the body, which can increase the likelihood of: Broken bones. A condition in which the bones become weak and brittle (osteoporosis). A large decline in bone mass occurs in older adults. In women, it occurs about the time of menopause. What actions can I take to keep my bones healthy? Good health habits are important for maintaining healthy bones. This includes eating nutritious foods and exercising regularly. To have healthy bones, you need to get enough of the right minerals and vitamins. Most nutrition experts recommend getting these nutrients from the foods that you eat. In some cases, taking supplements may also be recommended. Doing certain types of exercise is also important for bone health. What are the nutritional recommendations for healthy bones?  Eating a well-balanced diet with plenty of calcium and vitamin D will help to protect your bones. Nutritional recommendations vary from person to person. Ask your health care provider what is healthy for you. Here are some general guidelines. Get enough calcium Calcium is the most important (essential) mineral for bone health. Most people can get enough calcium from their diet, but supplements may be recommended for people who are at risk for  osteoporosis. Good sources of calcium include: Dairy products, such as low-fat or nonfat milk, cheese, and yogurt. Dark green leafy vegetables, such as bok choy and broccoli. Foods that have calcium added to them (are fortified). Foods that may be fortified with calcium include orange juice, cereal, bread, soy beverages, and tofu products. Nuts, such as almonds. Follow these recommended amounts for daily calcium intake: Infants, 0-6 months: 200 mg. Infants, 6-12 months: 260 mg. Children, age 647-3: 700 mg. Children, age 64-8: 1,000 mg. Children, age 642-13: 1,300 mg. Teens, age 38-18: 1,300 mg. Adults, age 79-50: 1,000 mg. Adults, age 23-70: Men: 1,000 mg. Women: 1,200 mg. Adults, age 97 or older: 1,200 mg. Pregnant and breastfeeding females: Teens: 1,300 mg. Adults: 1,000 mg. Get enough vitamin D Vitamin D is the most essential vitamin for bone health. It helps the body absorb calcium. Sunlight stimulates the skin to make vitamin D, so be sure to get enough sunlight. If you live in a cold climate or you do not get outside often, your health care provider may recommend that you take vitamin D supplements. Good sources of vitamin D in your diet include: Egg yolks. Saltwater fish. Milk and cereal fortified with vitamin D. Follow these recommended amounts for daily vitamin D intake: Infants, 0-12 months: 400 international units (IU). Children and teens, age 647-18: 600 international units. Adults, age 31 or younger: 600 international units. Adults, age 9 or older: 600-1,000 international units. Get other important nutrients Other nutrients that are important for bone health include: Phosphorus. This mineral is found in meat, poultry, dairy foods, nuts, and legumes. The  recommended daily intake for adult men and adult women is 700 mg. Magnesium. This mineral is found in seeds, nuts, dark green vegetables, and legumes. The recommended daily intake for adult men is 400-420 mg. For adult women,  it is 310-320 mg. Vitamin K. This vitamin is found in green leafy vegetables. The recommended daily intake is 120 mcg for adult men and 90 mcg for adult women. What type of physical activity is best for building and maintaining healthy bones? Weight-bearing and strength-building activities are important for building and maintaining healthy bones. Weight-bearing activities cause muscles and bones to work against gravity. Strength-building activities increase the strength of the muscles that support bones. Weight-bearing and muscle-building activities include: Walking and hiking. Jogging and running. Dancing. Gym exercises. Lifting weights. Tennis and racquetball. Climbing stairs. Aerobics. Adults should get at least 30 minutes of moderate physical activity on most days. Children should get at least 60 minutes of moderate physical activity on most days. Ask your health care provider what type of exercise is best for you. How can I find out if my bone mass is low? Bone mass can be measured with an X-ray test called a bone mineral density (BMD) test. This test is recommended for all women who are age 19 or older. It may also be recommended for: Men who are age 55 or older. People who are at risk for osteoporosis because of: Having a long-term disease that weakens bones, such as kidney disease or rheumatoid arthritis. Having menopause earlier than normal. Taking medicine that weakens bones, such as steroids, thyroid hormones, or hormone treatment for breast cancer or prostate cancer. Smoking. Drinking three or more alcoholic drinks a day. Being underweight. Sedentary lifestyle. If you find that you have a low bone mass, you may be able to prevent osteoporosis or further bone loss by changing your diet and lifestyle. Where can I find more information? Bone Health & Osteoporosis Foundation: https://carlson-fletcher.info/ [employer]: www.bones.http://www.myers.net/ International Osteoporosis  Foundation: Investment banker, operational.iofbonehealth.org Summary The aging process leads to an overall loss of bone mass in the body, which can increase the likelihood of broken bones and osteoporosis. Eating a well-balanced diet with plenty of calcium and vitamin D will help to protect your bones. Weight-bearing and strength-building activities are also important for building and maintaining strong bones. Bone mass can be measured with an X-ray test called a bone mineral density (BMD) test. This information is not intended to replace advice given to you by your health care provider. Make sure you discuss any questions you have with your health care provider. Document Revised: 04/19/2021 Document Reviewed: 04/19/2021 Elsevier Patient Education  2024 [employer].

## 2023-11-22 NOTE — Progress Notes (Signed)
 Subjective:    Patient ID: Toni Cooper, female    DOB: April 22, 1946, 78 y.o.   MRN: [id number]   Chief Complaint: medical management of chronic issues     HPI:  Toni Cooper is a 78 y.o. who identifies as a female who was assigned female at birth.   Social history: Lives with: husband Work history: retired   Water Engineer in today for follow up of the following chronic medical issues:  1. Primary hypertension No c/o chest pain, sob or headache. Does not check blood pressure at home. BP Readings from Last 3 Encounters:  08/20/23 109/60  02/19/23 (!) 141/74  01/02/23 130/68     2. Diabetes mellitus treated with oral medication (HCC ) 3. Diabetes mellitus treated with injections of insulin  medication (HCC) 4. Diabetes with insulin  therapy Fasting blood sugars running around 100-150. She is on lantus  daily as well as sliding scale as needed. She sees Dr. Tommas Lab Results  Component Value Date   HGBA1C 6.3 (H) 08/20/2023    5. Diabetic neuropathy No change  6. Hyperlipidemia associated with type 2 diabetes mellitus (HCC) Does not watch diet very closely but does no dedicated exercise. Lab Results  Component Value Date   CHOL 103 08/20/2023   HDL 36 (L) 08/20/2023   LDLCALC 48 08/20/2023   TRIG 99 08/20/2023   CHOLHDL 2.9 08/20/2023     7. HX of aortic valve replacement Last saw cardiology on 01/02/23. According to office note no changes were made to plan of care. She was instructed to follow up in one year.  8. Age-related osteoporosis without current pathological fracture Last dexascan was done on 11/28/16. Patient  no longer wants to do these.  9. Class 2 severe obesity due to excess calories with serious comorbidity and body mass index (BMI) of 38.0 to 38.9 in adult [facility]) Weight is down 6lbs  Wt Readings from Last 3 Encounters:  11/22/23 196 lb (88.9 kg)  08/20/23 194 lb (88 kg)  02/19/23 200 lb (90.7 kg)   BMI Readings from Last 3 Encounters:  11/22/23 33.64 kg/m   08/20/23 33.30 kg/m  02/19/23 34.33 kg/m       08/20/2023    8:35 AM 02/19/2023    8:15 AM 09/11/2022    8:00 AM 04/05/2022    8:09 AM  GAD 7 : Generalized Anxiety Score  Nervous, Anxious, on Edge 0 0 0 0  Control/stop worrying 0 0 0 0  Worry too much - different things 0 0 0 0  Trouble relaxing 0 0 0 0  Restless 0 0 0 0  Easily annoyed or irritable 0 0 0 0  Afraid - awful might happen 0 0 0 0  Total GAD 7 Score 0 0 0 0  Anxiety Difficulty Not difficult at all Not difficult at all Not difficult at all Not difficult at all       New complaints: None today  Allergies  Allergen Reactions   Sulfa Antibiotics Nausea And Vomiting   Alendronate  Other (See Comments)   Sulfamethoxazole-Trimethoprim Other (See Comments)   Outpatient Encounter Medications as of 11/22/2023  Medication Sig   acetaminophen  (TYLENOL ) 500 MG tablet Take 500 mg 2 (two) times daily as needed by mouth for moderate pain or headache.    Apoaequorin (PREVAGEN PO) Take by mouth.   aspirin  81 MG EC tablet Take 1 tablet (81 mg total) by mouth daily. (Patient taking differently: Take 81 mg by mouth in the morning and at bedtime.)  atorvastatin  (LIPITOR) 10 MG tablet Take 1 tablet (10 mg total) by mouth daily.   beta carotene w/minerals (OCUVITE) tablet Take 1 tablet by mouth daily.   calcium  citrate (CALCITRATE - DOSED IN MG ELEMENTAL CALCIUM ) 950 (200 Ca) MG tablet 1 tablet   cholecalciferol  (VITAMIN D ) 1000 UNITS tablet Take 1,000 Units by mouth daily.   denosumab  (PROLIA ) 60 MG/ML SOSY injection Inject 60 mg into the skin every 6 (six) months.   ferrous sulfate 324 MG TBEC 1 tablet (Patient not taking: Reported on 08/20/2023)   fluticasone (FLONASE) 50 MCG/ACT nasal spray 1 spray as needed.   hydrochlorothiazide  (HYDRODIURIL ) 25 MG tablet Take 1 tablet (25 mg total) by mouth daily.   LANTUS  100 UNIT/ML injection Inject 35 Units into the skin at bedtime.    lisinopril  (ZESTRIL ) 20 MG tablet Take 1 tablet (20 mg  total) by mouth daily.   LORazepam  (ATIVAN ) 0.5 MG tablet Take 1 tablet (0.5 mg total) by mouth 2 (two) times daily as needed for anxiety.   Magnesium  250 MG TABS Take 250 mg by mouth daily.    metFORMIN  (GLUCOPHAGE -XR) 500 MG 24 hr tablet Take 1 tablet (500 mg total) by mouth 2 (two) times daily.   metoprolol  tartrate (LOPRESSOR ) 25 MG tablet Take 0.5 tablets (12.5 mg total) by mouth 2 (two) times daily.   Multiple Vitamins-Minerals (MULTI-BETIC DIABETES) TABS Take 1 tablet daily by mouth.   NOVOLOG  100 UNIT/ML injection Inject into the skin.   pyridOXINE  (VITAMIN B-6) 100 MG tablet Take 100 mg by mouth daily.   Semaglutide ,0.25 or 0.5MG /DOS, (OZEMPIC , 0.25 OR 0.5 MG/DOSE,) 2 MG/1.5ML SOPN Inject 0.5 mg into the skin once a week. (Patient taking differently: Inject 0.25 mg into the skin once a week.)   vitamin C  (ASCORBIC ACID ) 500 MG tablet Take 500 mg by mouth daily.   No facility-administered encounter medications on file as of 11/22/2023.    Past Surgical History:  Procedure Laterality Date   APPENDECTOMY     FEMUR FRACTURE SURGERY  07/30/2020   FRACTURE SURGERY     shoulder   TEE WITHOUT CARDIOVERSION N/A 04/18/2016   Procedure: TRANSESOPHAGEAL ECHOCARDIOGRAM (TEE);  Surgeon: Aleene JINNY Passe, MD;  Location: [facility] ENDOSCOPY;  Service: Cardiovascular;  Laterality: N/A;   TEE WITHOUT CARDIOVERSION N/A 10/16/2017   Procedure: TRANSESOPHAGEAL ECHOCARDIOGRAM (TEE);  Surgeon: Dusty Sudie DEL, MD;  Location: [facility] OR;  Service: Open Heart Surgery;  Laterality: N/A;   TOTAL ABDOMINAL HYSTERECTOMY     WRIST SURGERY      Family History  Problem Relation Age of Onset   Stroke Father 33   Diabetes Father    Retinopathy of prematurity Father    Heart failure Mother    Heart attack Maternal Grandmother    Heart attack Paternal Grandmother    Diabetes Paternal Aunt    Diabetes Paternal Uncle       Controlled substance contract: n/a     Review of Systems  Constitutional:  Negative for  diaphoresis.  Eyes:  Negative for pain.  Respiratory:  Negative for shortness of breath.   Cardiovascular:  Negative for chest pain, palpitations and leg swelling.  Gastrointestinal:  Negative for abdominal pain.  Endocrine: Negative for polydipsia.  Skin:  Negative for rash.  Neurological:  Negative for dizziness, weakness and headaches.  Hematological:  Does not bruise/bleed easily.  All other systems reviewed and are negative.      Objective:   Physical Exam Vitals and nursing note reviewed.  Constitutional:  General: She is not in acute distress.    Appearance: Normal appearance. She is well-developed.  HENT:     Head: Normocephalic.     Right Ear: Tympanic membrane normal.     Left Ear: Tympanic membrane normal.     Nose: Nose normal.     Mouth/Throat:     Mouth: Mucous membranes are moist.  Eyes:     Pupils: Pupils are equal, round, and reactive to light.  Neck:     Vascular: No carotid bruit or JVD.  Cardiovascular:     Rate and Rhythm: Normal rate and regular rhythm.     Heart sounds: Murmur (3/6 murmur) heard.  Pulmonary:     Effort: Pulmonary effort is normal. No respiratory distress.     Breath sounds: Normal breath sounds. No wheezing or rales.  Chest:     Chest wall: No tenderness.  Abdominal:     General: Bowel sounds are normal. There is no distension or abdominal bruit.     Palpations: Abdomen is soft. There is no hepatomegaly, splenomegaly, mass or pulsatile mass.     Tenderness: There is no abdominal tenderness.  Musculoskeletal:        General: Normal range of motion.     Cervical back: Normal range of motion and neck supple.  Lymphadenopathy:     Cervical: No cervical adenopathy.  Skin:    General: Skin is warm and dry.  Neurological:     Mental Status: She is alert and oriented to person, place, and time.     Deep Tendon Reflexes: Reflexes are normal and symmetric.  Psychiatric:        Behavior: Behavior normal.        Thought Content:  Thought content normal.        Judgment: Judgment normal.     BP 137/69   Pulse 80   Temp 98.4 F (36.9 C) (Temporal)   Resp 20   Ht 5' 4 (1.626 m)   Wt 196 lb (88.9 kg)   SpO2 97%   BMI 33.64 kg/m   HGBA1c 6.3%     Assessment & Plan:  Toni Cooper comes in today with chief complaint of Medical Management of Chronic Issues   Diagnosis and orders addressed:  1. Primary hypertension (Primary) Low sodium diet - CBC with Differential/Platelet - CMP14+EGFR  2. Diabetes mellitus treated with oral medication (HCC) - Bayer DCA Hb A1c Waived  3. Diabetes mellitus treated with injections of non-insulin  medication (HCC)  4. Type 2 diabetes mellitus with insulin  therapy (HCC)  5. Type 2 diabetes mellitus with diabetic neuropathy, with long-term current use of insulin  (HCC) Keep follow up with Dr. Tommas  6. Hyperlipidemia associated with type 2 diabetes mellitus (HCC) Low fat diet - Lipid panel  7. History of aortic valve replacement with bioprosthetic valve Keep yearly follow up with cardiology  8. Age-related osteoporosis without current pathological fracture Weight bearing exercise  9. Class 2 severe obesity due to excess calories with serious comorbidity and body mass index (BMI) of 38.0 to 38.9 in adult [facility]) Discussed diet and exercise for person with BMI >25 Will recheck weight in 3-6 months   10. Vitamin D  deficiency Continue vitamin d  supplement - VITAMIN D  25 Hydroxy (Vit-D Deficiency, Fractures)  11. Anxiety Stress management   Labs pending Health Maintenance reviewed Diet and exercise encouraged  Follow up plan: 6 months   Mary-Margaret Gladis, FNP

## 2023-11-23 LAB — CMP14+EGFR
ALT: 20 [IU]/L (ref 0–32)
AST: 19 [IU]/L (ref 0–40)
Albumin: 4.1 g/dL (ref 3.8–4.8)
Alkaline Phosphatase: 64 [IU]/L (ref 44–121)
BUN/Creatinine Ratio: 22 (ref 12–28)
BUN: 19 mg/dL (ref 8–27)
Bilirubin Total: 0.4 mg/dL (ref 0.0–1.2)
CO2: 26 mmol/L (ref 20–29)
Calcium: 10 mg/dL (ref 8.7–10.3)
Chloride: 102 mmol/L (ref 96–106)
Creatinine, Ser: 0.86 mg/dL (ref 0.57–1.00)
Globulin, Total: 2.2 g/dL (ref 1.5–4.5)
Glucose: 133 mg/dL — ABNORMAL HIGH (ref 70–99)
Potassium: 4.3 mmol/L (ref 3.5–5.2)
Sodium: 143 mmol/L (ref 134–144)
Total Protein: 6.3 g/dL (ref 6.0–8.5)
eGFR: 69 mL/min/{1.73_m2} (ref >59)

## 2023-11-23 LAB — CBC WITH DIFFERENTIAL/PLATELET
Basophils Absolute: 0.1 10*3/uL (ref 0.0–0.2)
Basos: 1 %
EOS (ABSOLUTE): 0.3 10*3/uL (ref 0.0–0.4)
Eos: 4 %
Hematocrit: 41 % (ref 34.0–46.6)
Hemoglobin: 13.2 g/dL (ref 11.1–15.9)
Immature Grans (Abs): 0 10*3/uL (ref 0.0–0.1)
Immature Granulocytes: 1 %
Lymphocytes Absolute: 2 10*3/uL (ref 0.7–3.1)
Lymphs: 23 %
MCH: 30.6 pg (ref 26.6–33.0)
MCHC: 32.2 g/dL (ref 31.5–35.7)
MCV: 95 fL (ref 79–97)
Monocytes Absolute: 1 10*3/uL — ABNORMAL HIGH (ref 0.1–0.9)
Monocytes: 11 %
Neutrophils Absolute: 5.2 10*3/uL (ref 1.4–7.0)
Neutrophils: 60 %
Platelets: 291 10*3/uL (ref 150–450)
RBC: 4.31 x10E6/uL (ref 3.77–5.28)
RDW: 12.3 % (ref 11.7–15.4)
WBC: 8.6 10*3/uL (ref 3.4–10.8)

## 2023-11-23 LAB — VITAMIN D 25 HYDROXY (VIT D DEFICIENCY, FRACTURES): Vit D, 25-Hydroxy: 62.2 ng/mL (ref 30.0–100.0)

## 2023-11-23 LAB — LIPID PANEL
Cholesterol, Total: 103 mg/dL (ref 100–199)
HDL: 41 mg/dL (ref >39)
LDL CALC COMMENT:: 2.5 ratio (ref 0.0–4.4)
LDL Chol Calc (NIH): 45 mg/dL (ref 0–99)
Triglycerides: 86 mg/dL (ref 0–149)
VLDL Cholesterol Cal: 17 mg/dL (ref 5–40)

## 2023-11-25 ENCOUNTER — Encounter: Payer: Self-pay | Admitting: Nurse Practitioner

## 2023-11-25 NOTE — Progress Notes (Signed)
 Letter mailed

## 2023-12-03 DIAGNOSIS — E1165 Type 2 diabetes mellitus with hyperglycemia: Secondary | ICD-10-CM | POA: Diagnosis not present

## 2023-12-23 DIAGNOSIS — M810 Age-related osteoporosis without current pathological fracture: Secondary | ICD-10-CM | POA: Diagnosis not present

## 2023-12-23 DIAGNOSIS — Z780 Asymptomatic menopausal state: Secondary | ICD-10-CM | POA: Diagnosis not present

## 2024-01-03 DIAGNOSIS — E1165 Type 2 diabetes mellitus with hyperglycemia: Secondary | ICD-10-CM | POA: Diagnosis not present

## 2024-01-07 ENCOUNTER — Ambulatory Visit: Payer: Medicare PPO | Admitting: Nurse Practitioner

## 2024-01-07 ENCOUNTER — Encounter: Payer: Self-pay | Admitting: Nurse Practitioner

## 2024-01-07 VITALS — BP 154/76 | HR 67 | Temp 97.6°F | Ht 64.0 in | Wt 190.6 lb

## 2024-01-07 DIAGNOSIS — R051 Acute cough: Secondary | ICD-10-CM

## 2024-01-07 MED ORDER — GUAIFENESIN 400 MG PO TABS: 400.0000 mg | ORAL_TABLET | Freq: Four times a day (QID) | ORAL | 0 refills | Status: DC | PRN

## 2024-01-07 NOTE — Progress Notes (Signed)
Acute Office Visit  Subjective:     Patient ID: Toni Cooper, female    DOB: June 02, 1946, 78 y.o.   MRN: [id number]  Chief Complaint  Patient presents with   Cough    Cough on and off since christmas     HPI Toni Cooper 78 year old female presents hemorrhoids in 2025 for an acute visit complaining of cough since Christmas Reports that she saw her PCP in 11/22/2023 there was told that it was a viral infection Cough: Patient complains of productive cough with sputum described as white.  Symptoms began 2 months ago.  The cough is without wheezing, dyspnea or hemoptysis, productive of clear sputum and is aggravated by nothing Associated symptoms include:chest pain, chills, fever, heartburn, hemoptysis, and postnasal drip. Patient does not have new pets. Patient does not have a history of asthma. Patient does not have a history of environmental allergens. Patient does not have recent travel. Patient does not have a history of smoking.   Active Ambulatory Problems    Diagnosis Date Noted   Hypertension 09/07/2013   Hyperlipidemia associated with type 2 diabetes mellitus (HCC) 09/07/2013   Diabetes mellitus treated with injections of non-insulin medication (HCC) 09/07/2013   Osteoporosis 09/07/2013   Murmur 05/12/2014   History of aortic valve replacement with bioprosthetic valve 10/16/2017   Obesity    Adverse effect of bisphosphonate 10/14/2020   At risk for falls 10/14/2020   Closed displaced subtrochanteric fracture of right femur with delayed healing 06/14/2021   Combined form of age-related cataract, left eye 12/02/2019   Glaucoma suspect of right eye 12/02/2019   Type 2 diabetes mellitus with diabetic neuropathy, unspecified (HCC) 10/16/2021   Type 2 diabetes mellitus with insulin therapy (HCC) 11/22/2023   Diabetes mellitus treated with oral medication (HCC) 11/22/2023   Resolved Ambulatory Problems    Diagnosis Date Noted   CAD (coronary artery disease) 05/12/2014   Aortic  stenosis 01/12/2016   Palpitations 07/17/2017   Overweight 12/06/2020   Hyperglycemia due to type 2 diabetes mellitus (HCC) 10/16/2021   Leukocytosis 08/02/2020   Past Medical History:  Diagnosis Date   Aortic stenosis, severe 2015   Cataract    Diabetes mellitus without complication (HCC)    Dyspnea    S/P minimally invasive aortic valve replacement with bioprosthetic valve 10/16/2017   Squamous cell carcinoma of skin 03/30/2020     Review of Systems  Constitutional:  Negative for chills and fever.  HENT:  Negative for congestion and sore throat.   Respiratory:  Positive for cough and sputum production.        Clear sputum  Cardiovascular:  Negative for chest pain and leg swelling.  Gastrointestinal:  Negative for abdominal pain, diarrhea, nausea and vomiting.  Musculoskeletal: Negative.   Skin:  Negative for itching and rash.  Neurological:  Negative for dizziness and headaches.   Negative unless indicated in HPI    Objective:    BP (!) 154/76   Pulse 67   Temp 97.6 F (36.4 C) (Temporal)   Ht 5\' 4"  (1.626 m)   Wt 190 lb 9.6 oz (86.5 kg)   SpO2 94%   BMI 32.72 kg/m  BP Readings from Last 3 Encounters:  01/07/24 (!) 154/76  11/22/23 137/69  08/20/23 109/60   Wt Readings from Last 3 Encounters:  01/07/24 190 lb 9.6 oz (86.5 kg)  11/22/23 196 lb (88.9 kg)  08/20/23 194 lb (88 kg)      Physical Exam Vitals and nursing note reviewed.  Constitutional:      General: She is not in acute distress.    Appearance: She is obese.  HENT:     Head: Normocephalic and atraumatic.     Right Ear: Tympanic membrane, ear canal and external ear normal. There is no impacted cerumen.     Left Ear: Tympanic membrane, ear canal and external ear normal.     Nose: Nose normal. No congestion.     Mouth/Throat:     Mouth: Mucous membranes are moist.  Eyes:     General: No scleral icterus.    Extraocular Movements: Extraocular movements intact.     Conjunctiva/sclera:  Conjunctivae normal.     Pupils: Pupils are equal, round, and reactive to light.  Cardiovascular:     Heart sounds: Normal heart sounds.  Pulmonary:     Effort: Pulmonary effort is normal.     Breath sounds: Normal breath sounds.  Musculoskeletal:        General: Normal range of motion.     Cervical back: Normal range of motion. No rigidity or tenderness.     Right lower leg: No edema.     Left lower leg: No edema.  Skin:    General: Skin is warm and dry.     Capillary Refill: Capillary refill takes less than 2 seconds.     Findings: No rash.  Neurological:     Mental Status: She is alert and oriented to person, place, and time.  Psychiatric:        Mood and Affect: Mood normal.        Behavior: Behavior normal.        Thought Content: Thought content normal.        Judgment: Judgment normal.     No results found for any visits on 01/07/24.      Assessment & Plan:  Acute cough -     guaiFENesin; Take 1 tablet (400 mg total) by mouth every 6 (six) hours as needed.  Dispense: 30 tablet; Refill: 0   Toni Cooper is a 78 year old Caucasian female seen today for cough, no acute distress Guaifenesin 400 mg 1 tablet 3 times a day as needed for cough Increase hydration  Encourage healthy lifestyle choices, including diet (rich in fruits, vegetables, and lean proteins, and low in salt and simple carbohydrates) and exercise (at least 30 minutes of moderate physical activity daily).     The above assessment and management plan was discussed with the patient. The patient verbalized understanding of and has agreed to the management plan. Patient is aware to call the clinic if they develop any new symptoms or if symptoms persist or worsen. Patient is aware when to return to the clinic for a follow-up visit. Patient educated on when it is appropriate to go to the emergency department.  Return if symptoms worsen or fail to improve.  Arrie Aran Santa Lighter, [facility]  Medicine [address] [place], [place] [address] [phone]  Note: This document was prepared by [employer]ubin Milan voice dictation technology and any errors that results from this process are unintentional.

## 2024-01-26 NOTE — Progress Notes (Unsigned)
  Cardiology Office Note:   Date:  01/26/2024  ID:  GABRIELLE WAKELAND, DOB 05-03-1946, MRN [id number] PCP: Bennie Pierini, FNP  [facility] Providers Cardiologist:  None {  History of Present Illness:   Toni Cooper is a 78 y.o. female who presents for follow up after SAVR.   Since I last saw her ***   ***  she has had no new problems.  She is following reports our last year and had femur fractures that she has recovered from.  She is careful not to fall again.   The patient denies any new symptoms such as chest discomfort, neck or arm discomfort. There has been no new shortness of breath, PND or orthopnea. There have been no reported palpitations, presyncope or syncope.    ROS: ***  Studies Reviewed:    EKG:       ***  Risk Assessment/Calculations:   {Does this patient have ATRIAL FIBRILLATION?[phone]} No BP recorded.  {Refresh Note OR Click here to enter BP  :1}***        Physical Exam:   VS:  There were no vitals taken for this visit.   Wt Readings from Last 3 Encounters:  01/07/24 190 lb 9.6 oz (86.5 kg)  11/22/23 196 lb (88.9 kg)  08/20/23 194 lb (88 kg)     GEN: Well nourished, well developed in no acute distress NECK: No JVD; No carotid bruits CARDIAC: ***RR, *** murmurs, rubs, gallops RESPIRATORY:  Clear to auscultation without rales, wheezing or rhonchi  ABDOMEN: Soft, non-tender, non-distended EXTREMITIES:  No edema; No deformity   ASSESSMENT AND PLAN:   SAVR:    ***  I will check an echo as it has been five years.  She understands endocarditis prophylaxis.    OVERWEIGHT:   ***   She is lost about 15 pounds on Ozempic.  She is being followed by her endocrinologist.  We talked about low carbohydrates.   DM:  A1C was ***  6.9 unchanged from previous.  This is followed as above.    HTN:   Blood pressure is *** well-controlled.  No change in therapy.       Follow up ***  Signed, Rollene Rotunda, MD

## 2024-01-29 ENCOUNTER — Ambulatory Visit: Payer: Medicare PPO | Admitting: Cardiology

## 2024-01-29 ENCOUNTER — Encounter: Payer: Self-pay | Admitting: Cardiology

## 2024-01-29 VITALS — BP 138/70 | HR 72 | Ht 63.0 in | Wt 192.0 lb

## 2024-01-29 DIAGNOSIS — Z952 Presence of prosthetic heart valve: Secondary | ICD-10-CM | POA: Diagnosis not present

## 2024-01-29 DIAGNOSIS — I10 Essential (primary) hypertension: Secondary | ICD-10-CM | POA: Diagnosis not present

## 2024-01-29 DIAGNOSIS — E118 Type 2 diabetes mellitus with unspecified complications: Secondary | ICD-10-CM | POA: Diagnosis not present

## 2024-01-29 NOTE — Patient Instructions (Signed)

## 2024-01-31 DIAGNOSIS — E1165 Type 2 diabetes mellitus with hyperglycemia: Secondary | ICD-10-CM | POA: Diagnosis not present

## 2024-02-12 DIAGNOSIS — M810 Age-related osteoporosis without current pathological fracture: Secondary | ICD-10-CM | POA: Diagnosis not present

## 2024-03-02 DIAGNOSIS — E1165 Type 2 diabetes mellitus with hyperglycemia: Secondary | ICD-10-CM | POA: Diagnosis not present

## 2024-03-19 ENCOUNTER — Other Ambulatory Visit: Payer: Self-pay

## 2024-03-19 DIAGNOSIS — E1169 Type 2 diabetes mellitus with other specified complication: Secondary | ICD-10-CM

## 2024-03-19 DIAGNOSIS — I10 Essential (primary) hypertension: Secondary | ICD-10-CM

## 2024-03-19 DIAGNOSIS — E119 Type 2 diabetes mellitus without complications: Secondary | ICD-10-CM

## 2024-03-24 ENCOUNTER — Other Ambulatory Visit

## 2024-03-24 DIAGNOSIS — E785 Hyperlipidemia, unspecified: Secondary | ICD-10-CM | POA: Diagnosis not present

## 2024-03-24 DIAGNOSIS — E1169 Type 2 diabetes mellitus with other specified complication: Secondary | ICD-10-CM

## 2024-03-24 DIAGNOSIS — E119 Type 2 diabetes mellitus without complications: Secondary | ICD-10-CM

## 2024-03-24 DIAGNOSIS — I10 Essential (primary) hypertension: Secondary | ICD-10-CM

## 2024-03-24 DIAGNOSIS — Z7984 Long term (current) use of oral hypoglycemic drugs: Secondary | ICD-10-CM | POA: Diagnosis not present

## 2024-03-24 LAB — LIPID PANEL

## 2024-03-24 LAB — BAYER DCA HB A1C WAIVED: HB A1C (BAYER DCA - WAIVED): 6.3 % — ABNORMAL HIGH (ref 4.8–5.6)

## 2024-03-25 LAB — CBC WITH DIFFERENTIAL/PLATELET
Basophils Absolute: 0.1 10*3/uL (ref 0.0–0.2)
Basos: 1 %
EOS (ABSOLUTE): 0.2 10*3/uL (ref 0.0–0.4)
Eos: 3 %
Hematocrit: 40.1 % (ref 34.0–46.6)
Hemoglobin: 13.1 g/dL (ref 11.1–15.9)
Immature Grans (Abs): 0 10*3/uL (ref 0.0–0.1)
Immature Granulocytes: 0 %
Lymphocytes Absolute: 2.4 10*3/uL (ref 0.7–3.1)
Lymphs: 28 %
MCH: 30.7 pg (ref 26.6–33.0)
MCHC: 32.7 g/dL (ref 31.5–35.7)
MCV: 94 fL (ref 79–97)
Monocytes Absolute: 0.7 10*3/uL (ref 0.1–0.9)
Monocytes: 8 %
Neutrophils Absolute: 5.3 10*3/uL (ref 1.4–7.0)
Neutrophils: 60 %
Platelets: 267 10*3/uL (ref 150–450)
RBC: 4.27 x10E6/uL (ref 3.77–5.28)
RDW: 13.1 % (ref 11.7–15.4)
WBC: 8.7 10*3/uL (ref 3.4–10.8)

## 2024-03-25 LAB — CMP14+EGFR
ALT: 19 IU/L (ref 0–32)
AST: 19 IU/L (ref 0–40)
Albumin: 4.1 g/dL (ref 3.8–4.8)
Alkaline Phosphatase: 60 IU/L (ref 44–121)
BUN/Creatinine Ratio: 25 (ref 12–28)
BUN: 22 mg/dL (ref 8–27)
Bilirubin Total: 0.5 mg/dL (ref 0.0–1.2)
CO2: 24 mmol/L (ref 20–29)
Calcium: 10.3 mg/dL (ref 8.7–10.3)
Chloride: 104 mmol/L (ref 96–106)
Creatinine, Ser: 0.89 mg/dL (ref 0.57–1.00)
Globulin, Total: 2.2 g/dL (ref 1.5–4.5)
Glucose: 94 mg/dL (ref 70–99)
Potassium: 4.2 mmol/L (ref 3.5–5.2)
Sodium: 144 mmol/L (ref 134–144)
Total Protein: 6.3 g/dL (ref 6.0–8.5)
eGFR: 66 mL/min/{1.73_m2} (ref >59)

## 2024-03-25 LAB — LIPID PANEL
Cholesterol, Total: 107 mg/dL (ref 100–199)
HDL: 41 mg/dL (ref >39)
LDL CALC COMMENT:: 2.6 ratio (ref 0.0–4.4)
LDL Chol Calc (NIH): 49 mg/dL (ref 0–99)
Triglycerides: 88 mg/dL (ref 0–149)
VLDL Cholesterol Cal: 17 mg/dL (ref 5–40)

## 2024-03-25 LAB — VITAMIN B12: Vitamin B-12: 200 pg/mL — ABNORMAL LOW (ref 232–1245)

## 2024-03-26 MED ORDER — CYANOCOBALAMIN 1000 MCG/ML IJ SOLN
INTRAMUSCULAR | 11 refills | Status: DC
Start: 2024-03-26 — End: 2024-03-26

## 2024-03-26 MED ORDER — CYANOCOBALAMIN 1000 MCG/ML IJ SOLN: INTRAMUSCULAR | 11 refills | Status: AC

## 2024-04-02 DIAGNOSIS — M899 Disorder of bone, unspecified: Secondary | ICD-10-CM | POA: Diagnosis not present

## 2024-04-02 DIAGNOSIS — I10 Essential (primary) hypertension: Secondary | ICD-10-CM | POA: Diagnosis not present

## 2024-04-02 DIAGNOSIS — E559 Vitamin D deficiency, unspecified: Secondary | ICD-10-CM | POA: Diagnosis not present

## 2024-04-02 DIAGNOSIS — E1165 Type 2 diabetes mellitus with hyperglycemia: Secondary | ICD-10-CM | POA: Diagnosis not present

## 2024-04-02 DIAGNOSIS — E7800 Pure hypercholesterolemia, unspecified: Secondary | ICD-10-CM | POA: Diagnosis not present

## 2024-04-02 DIAGNOSIS — G609 Hereditary and idiopathic neuropathy, unspecified: Secondary | ICD-10-CM | POA: Diagnosis not present

## 2024-05-02 DIAGNOSIS — E1165 Type 2 diabetes mellitus with hyperglycemia: Secondary | ICD-10-CM | POA: Diagnosis not present

## 2024-05-19 ENCOUNTER — Encounter: Payer: Self-pay | Admitting: Nurse Practitioner

## 2024-05-19 ENCOUNTER — Ambulatory Visit: Payer: Medicare PPO | Admitting: Nurse Practitioner

## 2024-05-19 VITALS — BP 138/65 | HR 71 | Temp 98.1°F | Ht 63.0 in | Wt 193.0 lb

## 2024-05-19 DIAGNOSIS — Z7984 Long term (current) use of oral hypoglycemic drugs: Secondary | ICD-10-CM | POA: Diagnosis not present

## 2024-05-19 DIAGNOSIS — E785 Hyperlipidemia, unspecified: Secondary | ICD-10-CM | POA: Diagnosis not present

## 2024-05-19 DIAGNOSIS — E119 Type 2 diabetes mellitus without complications: Secondary | ICD-10-CM | POA: Diagnosis not present

## 2024-05-19 DIAGNOSIS — E1169 Type 2 diabetes mellitus with other specified complication: Secondary | ICD-10-CM | POA: Diagnosis not present

## 2024-05-19 DIAGNOSIS — Z6838 Body mass index (BMI) 38.0-38.9, adult: Secondary | ICD-10-CM

## 2024-05-19 DIAGNOSIS — E1140 Type 2 diabetes mellitus with diabetic neuropathy, unspecified: Secondary | ICD-10-CM

## 2024-05-19 DIAGNOSIS — F419 Anxiety disorder, unspecified: Secondary | ICD-10-CM

## 2024-05-19 DIAGNOSIS — E66812 Obesity, class 2: Secondary | ICD-10-CM

## 2024-05-19 DIAGNOSIS — Z794 Long term (current) use of insulin: Secondary | ICD-10-CM

## 2024-05-19 DIAGNOSIS — I10 Essential (primary) hypertension: Secondary | ICD-10-CM

## 2024-05-19 MED ORDER — HYDROCHLOROTHIAZIDE 25 MG PO TABS: 25.0000 mg | ORAL_TABLET | Freq: Every day | ORAL | 1 refills | Status: DC

## 2024-05-19 MED ORDER — LISINOPRIL 20 MG PO TABS: 20.0000 mg | ORAL_TABLET | Freq: Every day | ORAL | 1 refills | Status: DC

## 2024-05-19 MED ORDER — LORAZEPAM 0.5 MG PO TABS: 0.5000 mg | ORAL_TABLET | Freq: Two times a day (BID) | ORAL | 5 refills | Status: DC | PRN

## 2024-05-19 MED ORDER — ATORVASTATIN CALCIUM 10 MG PO TABS: 10.0000 mg | ORAL_TABLET | Freq: Every day | ORAL | 1 refills | Status: DC

## 2024-05-19 MED ORDER — METOPROLOL TARTRATE 25 MG PO TABS: 12.5000 mg | ORAL_TABLET | Freq: Two times a day (BID) | ORAL | 1 refills | Status: DC

## 2024-05-19 NOTE — Progress Notes (Signed)
 Subjective:    Patient ID: Toni Cooper, female    DOB: Sep 02, 1946, 78 y.o.   MRN: [id number]   Chief Complaint: medical management of chronic issues     HPI:  Toni Cooper is a 78 y.o. who identifies as a female who was assigned female at birth.   Social history: Lives with: husband Work history: retired   Water engineer in today for follow up of the following chronic medical issues:  1. Primary hypertension No c/o chest pain, sob or headache. Does not check blood pressure at home. BP Readings from Last 3 Encounters:  01/29/24 138/70  01/07/24 (!) 154/76  11/22/23 137/69     2. Diabetes mellitus treated with oral medication (HCC ) 3. Diabetes mellitus treated with injections of insulin  medication (HCC) 4. Diabetes with insulin  therapy Fasting blood sugars running around 100-150. She is on lantus  daily as well as sliding scale as needed. She sees Dr. Tommas Lab Results  Component Value Date   HGBA1C 6.3 (H) 03/24/2024    5. Diabetic neuropathy No change  6. Hyperlipidemia associated with type 2 diabetes mellitus (HCC) Does not watch diet very closely but does no dedicated exercise. Lab Results  Component Value Date   CHOL 107 03/24/2024   HDL 41 03/24/2024   LDLCALC 49 03/24/2024   TRIG 88 03/24/2024   CHOLHDL 2.6 03/24/2024     7. HX of aortic valve replacement Last saw cardiology on 01/02/23. According to office note no changes were made to plan of care. She was instructed to follow up in one year.  8. Age-related osteoporosis without current pathological fracture Last dexascan was done on 11/28/16. Patient  no longer wants to do these.  9. Class 2 severe obesity due to excess calories with serious comorbidity and body mass index (BMI) of 38.0 to 38.9 in adult [facility]) Weight is down 6lbs  Wt Readings from Last 3 Encounters:  05/19/24 193 lb (87.5 kg)  01/29/24 192 lb (87.1 kg)  01/07/24 190 lb 9.6 oz (86.5 kg)   BMI Readings from Last 3 Encounters:  05/19/24 34.19  kg/m  01/29/24 34.01 kg/m  01/07/24 32.72 kg/m      New complaints: None today  Allergies  Allergen Reactions   Sulfa Antibiotics Nausea And Vomiting   Alendronate  Other (See Comments)   Sulfamethoxazole-Trimethoprim Other (See Comments)   Outpatient Encounter Medications as of 05/19/2024  Medication Sig   acetaminophen  (TYLENOL ) 500 MG tablet Take 500 mg 2 (two) times daily as needed by mouth for moderate pain or headache.    Apoaequorin (PREVAGEN PO) Take by mouth.   aspirin  81 MG EC tablet Take 1 tablet (81 mg total) by mouth daily. (Patient taking differently: Take 81 mg by mouth in the morning and at bedtime.)   atorvastatin  (LIPITOR) 10 MG tablet Take 1 tablet (10 mg total) by mouth daily.   beta carotene w/minerals (OCUVITE) tablet Take 1 tablet by mouth daily.   calcium  citrate (CALCITRATE - DOSED IN MG ELEMENTAL CALCIUM ) 950 (200 Ca) MG tablet 1 tablet   cholecalciferol  (VITAMIN D ) 1000 UNITS tablet Take 1,000 Units by mouth daily.   cyanocobalamin  (VITAMIN B12) 1000 MCG/ML injection 1 ml weekly for 4 weeks then QOW for 4 weeks then monthly   denosumab  (PROLIA ) 60 MG/ML SOSY injection Inject 60 mg into the skin every 6 (six) months.   fluticasone (FLONASE) 50 MCG/ACT nasal spray 1 spray as needed.   guaifenesin  (HUMIBID E) 400 MG TABS tablet Take 1 tablet (  400 mg total) by mouth every 6 (six) hours as needed.   hydrochlorothiazide  (HYDRODIURIL ) 25 MG tablet Take 1 tablet (25 mg total) by mouth daily.   LANTUS  100 UNIT/ML injection Inject 35 Units into the skin at bedtime.    lisinopril  (ZESTRIL ) 20 MG tablet Take 1 tablet (20 mg total) by mouth daily.   LORazepam  (ATIVAN ) 0.5 MG tablet Take 1 tablet (0.5 mg total) by mouth 2 (two) times daily as needed for anxiety.   Magnesium  250 MG TABS Take 250 mg by mouth daily.    metFORMIN  (GLUCOPHAGE -XR) 500 MG 24 hr tablet Take 1 tablet (500 mg total) by mouth 2 (two) times daily.   metoprolol  tartrate (LOPRESSOR ) 25 MG tablet  Take 0.5 tablets (12.5 mg total) by mouth 2 (two) times daily.   NOVOLOG  100 UNIT/ML injection Inject into the skin.   pyridOXINE  (VITAMIN B-6) 100 MG tablet Take 100 mg by mouth daily.   Semaglutide ,0.25 or 0.5MG /DOS, (OZEMPIC , 0.25 OR 0.5 MG/DOSE,) 2 MG/1.5ML SOPN Inject 0.5 mg into the skin once a week.   vitamin C  (ASCORBIC ACID ) 500 MG tablet Take 500 mg by mouth daily.   No facility-administered encounter medications on file as of 05/19/2024.    Past Surgical History:  Procedure Laterality Date   APPENDECTOMY     FEMUR FRACTURE SURGERY  07/30/2020   FRACTURE SURGERY     shoulder   TEE WITHOUT CARDIOVERSION N/A 04/18/2016   Procedure: TRANSESOPHAGEAL ECHOCARDIOGRAM (TEE);  Surgeon: Aleene JINNY Passe, MD;  Location: [facility] ENDOSCOPY;  Service: Cardiovascular;  Laterality: N/A;   TEE WITHOUT CARDIOVERSION N/A 10/16/2017   Procedure: TRANSESOPHAGEAL ECHOCARDIOGRAM (TEE);  Surgeon: Dusty Sudie DEL, MD;  Location: [facility] OR;  Service: Open Heart Surgery;  Laterality: N/A;   TOTAL ABDOMINAL HYSTERECTOMY     WRIST SURGERY      Family History  Problem Relation Age of Onset   Stroke Father 25   Diabetes Father    Retinopathy of prematurity Father    Heart failure Mother    Heart attack Maternal Grandmother    Heart attack Paternal Grandmother    Diabetes Paternal Aunt    Diabetes Paternal Uncle       Controlled substance contract: n/a     Review of Systems  Constitutional:  Negative for diaphoresis.  Eyes:  Negative for pain.  Respiratory:  Negative for shortness of breath.   Cardiovascular:  Negative for chest pain, palpitations and leg swelling.  Gastrointestinal:  Negative for abdominal pain.  Endocrine: Negative for polydipsia.  Skin:  Negative for rash.  Neurological:  Negative for dizziness, weakness and headaches.  Hematological:  Does not bruise/bleed easily.  All other systems reviewed and are negative.      Objective:   Physical Exam Vitals and nursing note  reviewed.  Constitutional:      General: She is not in acute distress.    Appearance: Normal appearance. She is well-developed.  HENT:     Head: Normocephalic.     Right Ear: Tympanic membrane normal.     Left Ear: Tympanic membrane normal.     Nose: Nose normal.     Mouth/Throat:     Mouth: Mucous membranes are moist.   Eyes:     Pupils: Pupils are equal, round, and reactive to light.   Neck:     Vascular: No carotid bruit or JVD.   Cardiovascular:     Rate and Rhythm: Normal rate and regular rhythm.     Heart sounds: Murmur (3/6  murmur) heard.  Pulmonary:     Effort: Pulmonary effort is normal. No respiratory distress.     Breath sounds: Normal breath sounds. No wheezing or rales.  Chest:     Chest wall: No tenderness.  Abdominal:     General: Bowel sounds are normal. There is no distension or abdominal bruit.     Palpations: Abdomen is soft. There is no hepatomegaly, splenomegaly, mass or pulsatile mass.     Tenderness: There is no abdominal tenderness.   Musculoskeletal:        General: Normal range of motion.     Cervical back: Normal range of motion and neck supple.  Lymphadenopathy:     Cervical: No cervical adenopathy.   Skin:    General: Skin is warm and dry.   Neurological:     Mental Status: She is alert and oriented to person, place, and time.     Deep Tendon Reflexes: Reflexes are normal and symmetric.   Psychiatric:        Behavior: Behavior normal.        Thought Content: Thought content normal.        Judgment: Judgment normal.     BP 138/65   Pulse 71   Temp 98.1 F (36.7 C) (Temporal)   Ht 5' 3 (1.6 m)   Wt 193 lb (87.5 kg)   SpO2 96%   BMI 34.19 kg/m    HGBA1c 6.3%     Assessment & Plan:  LAPORCHIA NAKAJIMA comes in today with chief complaint of medical management of chronic issues    Diagnosis and orders addressed:  1. Primary hypertension (Primary) Low sodium diet - CBC with Differential/Platelet - CMP14+EGFR  2. Diabetes  mellitus treated with oral medication (HCC) - Bayer DCA Hb A1c Waived  3. Diabetes mellitus treated with injections of non-insulin  medication (HCC)  4. Type 2 diabetes mellitus with insulin  therapy (HCC)  5. Type 2 diabetes mellitus with diabetic neuropathy, with long-term current use of insulin  (HCC) Keep follow up with Dr. Tommas  6. Hyperlipidemia associated with type 2 diabetes mellitus (HCC) Low fat diet - Lipid panel  7. History of aortic valve replacement with bioprosthetic valve Keep yearly follow up with cardiology  8. Age-related osteoporosis without current pathological fracture Weight bearing exercise  9. Class 2 severe obesity due to excess calories with serious comorbidity and body mass index (BMI) of 38.0 to 38.9 in adult [facility]) Discussed diet and exercise for person with BMI >25 Will recheck weight in 3-6 months   10. Vitamin D  deficiency Continue vitamin d  supplement - VITAMIN D  25 Hydroxy (Vit-D Deficiency, Fractures)  11. Anxiety Stress management  Meds ordered this encounter  Medications   LORazepam  (ATIVAN ) 0.5 MG tablet    Sig: Take 1 tablet (0.5 mg total) by mouth 2 (two) times daily as needed for anxiety.    Dispense:  60 tablet    Refill:  5    Supervising Provider:   DETTINGER, JOSHUA A [[id number]]   atorvastatin  (LIPITOR) 10 MG tablet    Sig: Take 1 tablet (10 mg total) by mouth daily.    Dispense:  90 tablet    Refill:  1    Supervising Provider:   DETTINGER, JOSHUA A [[id number]]   metoprolol  tartrate (LOPRESSOR ) 25 MG tablet    Sig: Take 0.5 tablets (12.5 mg total) by mouth 2 (two) times daily.    Dispense:  90 tablet    Refill:  1    Supervising Provider:  DETTINGER, JOSHUA A [[id number]]   hydrochlorothiazide  (HYDRODIURIL ) 25 MG tablet    Sig: Take 1 tablet (25 mg total) by mouth daily.    Dispense:  90 tablet    Refill:  1    Supervising Provider:   DETTINGER, JOSHUA A [[id number]]   lisinopril  (ZESTRIL ) 20 MG tablet    Sig: Take 1 tablet  (20 mg total) by mouth daily.    Dispense:  90 tablet    Refill:  1    Supervising Provider:   MARYANNE CHEW A [id number]    Labs pending Health Maintenance reviewed Diet and exercise encouraged  Follow up plan: 6 months   Mary-Margaret Gladis, FNP

## 2024-05-19 NOTE — Patient Instructions (Signed)
 Vitamin B12 Deficiency Vitamin B12 deficiency means that your body does not have enough vitamin B12. The body needs this important vitamin: To make red blood cells. To make genes (DNA). To help the nerves work. If you do not have enough vitamin B12 in your body, you can have health problems, such as not having enough red blood cells in the blood (anemia). What are the causes? Not eating enough foods that contain vitamin B12. Not being able to take in (absorb) vitamin B12 from the food that you eat. Certain diseases. A condition in which the body does not make enough of a certain protein. This results in your body not taking in enough vitamin B12. Having a surgery in which part of the stomach or small intestine is taken out. Taking medicines that make it hard for the body to take in vitamin B12. These include: Heartburn medicines. Some medicines that are used to treat diabetes. What increases the risk? Being an older adult. Eating a vegetarian or vegan diet that does not include any foods that come from animals. Not eating enough foods that contain vitamin B12 while you are pregnant. Taking certain medicines. Having alcoholism. What are the signs or symptoms? In some cases, there are no symptoms. If the condition leads to too few blood cells or nerve damage, symptoms can occur, such as: Feeling weak or tired. Not being hungry. Losing feeling (numbness) or tingling in your hands and feet. Redness and burning of the tongue. Feeling sad (depressed). Confusion or memory problems. Trouble walking. If anemia is very bad, symptoms can include: Being short of breath. Being dizzy. Having a very fast heartbeat. How is this treated? Changing the way you eat and drink, such as: Eating more foods that contain vitamin B12. Drinking little or no alcohol. Getting vitamin B12 shots. Taking vitamin B12 supplements by mouth (orally). Your doctor will tell you the dose that is best for you. Follow  these instructions at home: Eating and drinking  Eat foods that come from animals and have a lot of vitamin B12 in them. These include: Meats and poultry. This includes beef, pork, chicken, [place], and organ meats, such as liver. Seafood, such as clams, rainbow trout, salmon, tuna, and haddock. Eggs. Dairy foods such as milk, yogurt, and cheese. Eat breakfast cereals that have vitamin B12 added to them (are fortified). Check the label. The items listed above may not be a complete list of foods and beverages you can eat and drink. Contact a dietitian for more information. Alcohol use Do not drink alcohol if: Your doctor tells you not to drink. You are pregnant, may be pregnant, or are planning to become pregnant. If you drink alcohol: Limit how much you have to: 0-1 drink a day for women. 0-2 drinks a day for men. Know how much alcohol is in your drink. In the [place] one drink equals one 12 oz bottle of beer (355 mL), one 5 oz glass of wine (148 mL), or one 1 oz glass of hard liquor (44 mL). General instructions Get any vitamin B12 shots if told by your doctor. Take supplements only as told by your doctor. Follow the directions. Keep all follow-up visits. Contact a doctor if: Your symptoms come back. Your symptoms get worse or do not get better with treatment. Get help right away if: You have trouble breathing. You have a very fast heartbeat. You have chest pain. You get dizzy. You faint. These symptoms may be an emergency. Get help right away. Call 911.  Do not wait to see if the symptoms will go away. Do not drive yourself to the hospital. Summary Vitamin B12 deficiency means that your body is not getting enough of the vitamin. In some cases, there are no symptoms of this condition. Treatment may include making a change in the way you eat and drink, getting shots, or taking supplements. Eat foods that have vitamin B12 in them. This information is not intended to replace advice  given to you by your health care provider. Make sure you discuss any questions you have with your health care provider. Document Revised: 06/30/2021 Document Reviewed: 06/30/2021 Elsevier Patient Education  2024 [employer].

## 2024-06-01 DIAGNOSIS — E1165 Type 2 diabetes mellitus with hyperglycemia: Secondary | ICD-10-CM | POA: Diagnosis not present

## 2024-06-15 DIAGNOSIS — Z1231 Encounter for screening mammogram for malignant neoplasm of breast: Secondary | ICD-10-CM | POA: Diagnosis not present

## 2024-06-15 LAB — HM MAMMOGRAPHY

## 2024-06-19 ENCOUNTER — Ambulatory Visit

## 2024-07-02 DIAGNOSIS — E1165 Type 2 diabetes mellitus with hyperglycemia: Secondary | ICD-10-CM | POA: Diagnosis not present

## 2024-08-02 DIAGNOSIS — E1165 Type 2 diabetes mellitus with hyperglycemia: Secondary | ICD-10-CM | POA: Diagnosis not present

## 2024-09-01 DIAGNOSIS — E1165 Type 2 diabetes mellitus with hyperglycemia: Secondary | ICD-10-CM | POA: Diagnosis not present

## 2024-09-07 DIAGNOSIS — Z5181 Encounter for therapeutic drug level monitoring: Secondary | ICD-10-CM | POA: Diagnosis not present

## 2024-09-07 DIAGNOSIS — M810 Age-related osteoporosis without current pathological fracture: Secondary | ICD-10-CM | POA: Diagnosis not present

## 2024-09-24 ENCOUNTER — Other Ambulatory Visit: Payer: Self-pay | Admitting: Nurse Practitioner

## 2024-09-24 DIAGNOSIS — E119 Type 2 diabetes mellitus without complications: Secondary | ICD-10-CM

## 2024-09-24 DIAGNOSIS — E1169 Type 2 diabetes mellitus with other specified complication: Secondary | ICD-10-CM

## 2024-09-24 DIAGNOSIS — I10 Essential (primary) hypertension: Secondary | ICD-10-CM

## 2024-09-24 DIAGNOSIS — Z6838 Body mass index (BMI) 38.0-38.9, adult: Secondary | ICD-10-CM

## 2024-09-24 NOTE — Progress Notes (Signed)
 Orders Placed This Encounter  Procedures   CBC with Differential/Platelet    Standing Status:   Future    Expected Date:   10/01/2024    Expiration Date:   09/24/2025   CMP14+EGFR    Standing Status:   Future    Expected Date:   10/01/2024    Expiration Date:   09/24/2025   Lipid panel    Standing Status:   Future    Expected Date:   10/01/2024    Expiration Date:   09/24/2025   Thyroid Panel With TSH    Standing Status:   Future    Expected Date:   10/01/2024    Expiration Date:   09/24/2025   Bayer [facility] Hb A1c Waived    Standing Status:   Future    Expected Date:   10/01/2024    Expiration Date:   09/24/2025   Mary-Margaret Gladis, FNP

## 2024-09-30 ENCOUNTER — Other Ambulatory Visit

## 2024-09-30 DIAGNOSIS — E785 Hyperlipidemia, unspecified: Secondary | ICD-10-CM | POA: Diagnosis not present

## 2024-09-30 DIAGNOSIS — I10 Essential (primary) hypertension: Secondary | ICD-10-CM

## 2024-09-30 DIAGNOSIS — E1169 Type 2 diabetes mellitus with other specified complication: Secondary | ICD-10-CM | POA: Diagnosis not present

## 2024-09-30 DIAGNOSIS — Z7984 Long term (current) use of oral hypoglycemic drugs: Secondary | ICD-10-CM | POA: Diagnosis not present

## 2024-09-30 DIAGNOSIS — E119 Type 2 diabetes mellitus without complications: Secondary | ICD-10-CM

## 2024-09-30 LAB — BAYER DCA HB A1C WAIVED: HB A1C (BAYER DCA - WAIVED): 6.6 % — ABNORMAL HIGH (ref 4.8–5.6)

## 2024-10-01 ENCOUNTER — Ambulatory Visit: Payer: Self-pay | Admitting: Nurse Practitioner

## 2024-10-01 LAB — LIPID PANEL
Chol/HDL Ratio: 2.6 ratio (ref 0.0–4.4)
Cholesterol, Total: 110 mg/dL (ref 100–199)
HDL: 43 mg/dL (ref >39)
LDL Chol Calc (NIH): 52 mg/dL (ref 0–99)
Triglycerides: 70 mg/dL (ref 0–149)
VLDL Cholesterol Cal: 15 mg/dL (ref 5–40)

## 2024-10-01 LAB — CBC WITH DIFFERENTIAL/PLATELET
Basophils Absolute: 0 x10E3/uL (ref 0.0–0.2)
Basos: 1 %
EOS (ABSOLUTE): 0.2 x10E3/uL (ref 0.0–0.4)
Eos: 3 %
Hematocrit: 41.8 % (ref 34.0–46.6)
Hemoglobin: 13.3 g/dL (ref 11.1–15.9)
Immature Grans (Abs): 0 x10E3/uL (ref 0.0–0.1)
Immature Granulocytes: 0 %
Lymphocytes Absolute: 2 x10E3/uL (ref 0.7–3.1)
Lymphs: 27 %
MCH: 30.3 pg (ref 26.6–33.0)
MCHC: 31.8 g/dL (ref 31.5–35.7)
MCV: 95 fL (ref 79–97)
Monocytes Absolute: 0.6 x10E3/uL (ref 0.1–0.9)
Monocytes: 8 %
Neutrophils Absolute: 4.6 x10E3/uL (ref 1.4–7.0)
Neutrophils: 61 %
Platelets: 302 x10E3/uL (ref 150–450)
RBC: 4.39 x10E6/uL (ref 3.77–5.28)
RDW: 12.4 % (ref 11.7–15.4)
WBC: 7.5 x10E3/uL (ref 3.4–10.8)

## 2024-10-01 LAB — CMP14+EGFR
ALT: 17 IU/L (ref 0–32)
AST: 19 IU/L (ref 0–40)
Albumin: 4.2 g/dL (ref 3.8–4.8)
Alkaline Phosphatase: 58 IU/L (ref 49–135)
BUN/Creatinine Ratio: 22 (ref 12–28)
BUN: 20 mg/dL (ref 8–27)
Bilirubin Total: 0.4 mg/dL (ref 0.0–1.2)
CO2: 27 mmol/L (ref 20–29)
Calcium: 10.3 mg/dL (ref 8.7–10.3)
Chloride: 103 mmol/L (ref 96–106)
Creatinine, Ser: 0.89 mg/dL (ref 0.57–1.00)
Globulin, Total: 2.2 g/dL (ref 1.5–4.5)
Glucose: 142 mg/dL — ABNORMAL HIGH (ref 70–99)
Potassium: 4.4 mmol/L (ref 3.5–5.2)
Sodium: 144 mmol/L (ref 134–144)
Total Protein: 6.4 g/dL (ref 6.0–8.5)
eGFR: 66 mL/min/1.73 (ref >59)

## 2024-10-01 LAB — THYROID PANEL WITH TSH
Free Thyroxine Index: 2.5 (ref 1.2–4.9)
T3 Uptake Ratio: 33 % (ref 24–39)
T4, Total: 7.5 ug/dL (ref 4.5–12.0)
TSH: 1.61 u[IU]/mL (ref 0.450–4.500)

## 2024-10-02 DIAGNOSIS — E1165 Type 2 diabetes mellitus with hyperglycemia: Secondary | ICD-10-CM | POA: Diagnosis not present

## 2024-10-06 DIAGNOSIS — G609 Hereditary and idiopathic neuropathy, unspecified: Secondary | ICD-10-CM | POA: Diagnosis not present

## 2024-10-06 DIAGNOSIS — E559 Vitamin D deficiency, unspecified: Secondary | ICD-10-CM | POA: Diagnosis not present

## 2024-10-06 DIAGNOSIS — M899 Disorder of bone, unspecified: Secondary | ICD-10-CM | POA: Diagnosis not present

## 2024-10-06 DIAGNOSIS — E1165 Type 2 diabetes mellitus with hyperglycemia: Secondary | ICD-10-CM | POA: Diagnosis not present

## 2024-10-06 DIAGNOSIS — I10 Essential (primary) hypertension: Secondary | ICD-10-CM | POA: Diagnosis not present

## 2024-10-06 DIAGNOSIS — E7800 Pure hypercholesterolemia, unspecified: Secondary | ICD-10-CM | POA: Diagnosis not present

## 2024-11-16 ENCOUNTER — Encounter: Payer: Self-pay | Admitting: Nurse Practitioner

## 2024-11-16 ENCOUNTER — Ambulatory Visit: Payer: Self-pay | Admitting: Nurse Practitioner

## 2024-11-16 VITALS — BP 136/86 | HR 71 | Temp 97.7°F | Ht 63.0 in | Wt 188.0 lb

## 2024-11-16 DIAGNOSIS — M810 Age-related osteoporosis without current pathological fracture: Secondary | ICD-10-CM

## 2024-11-16 DIAGNOSIS — E66812 Obesity, class 2: Secondary | ICD-10-CM

## 2024-11-16 DIAGNOSIS — Z953 Presence of xenogenic heart valve: Secondary | ICD-10-CM

## 2024-11-16 DIAGNOSIS — I10 Essential (primary) hypertension: Secondary | ICD-10-CM

## 2024-11-16 DIAGNOSIS — Z6838 Body mass index (BMI) 38.0-38.9, adult: Secondary | ICD-10-CM

## 2024-11-16 DIAGNOSIS — Z23 Encounter for immunization: Secondary | ICD-10-CM

## 2024-11-16 DIAGNOSIS — Z7985 Long-term (current) use of injectable non-insulin antidiabetic drugs: Secondary | ICD-10-CM

## 2024-11-16 DIAGNOSIS — Z7984 Long term (current) use of oral hypoglycemic drugs: Secondary | ICD-10-CM

## 2024-11-16 DIAGNOSIS — E119 Type 2 diabetes mellitus without complications: Secondary | ICD-10-CM

## 2024-11-16 DIAGNOSIS — E1169 Type 2 diabetes mellitus with other specified complication: Secondary | ICD-10-CM

## 2024-11-16 DIAGNOSIS — F419 Anxiety disorder, unspecified: Secondary | ICD-10-CM

## 2024-11-16 DIAGNOSIS — E109 Type 1 diabetes mellitus without complications: Secondary | ICD-10-CM | POA: Insufficient documentation

## 2024-11-16 DIAGNOSIS — E1140 Type 2 diabetes mellitus with diabetic neuropathy, unspecified: Secondary | ICD-10-CM

## 2024-11-16 MED ORDER — METOPROLOL TARTRATE 25 MG PO TABS: 12.5000 mg | ORAL_TABLET | Freq: Two times a day (BID) | ORAL | 1 refills | Status: AC

## 2024-11-16 MED ORDER — LISINOPRIL 20 MG PO TABS: 20.0000 mg | ORAL_TABLET | Freq: Every day | ORAL | 1 refills | Status: AC

## 2024-11-16 MED ORDER — ATORVASTATIN CALCIUM 10 MG PO TABS: 10.0000 mg | ORAL_TABLET | Freq: Every day | ORAL | 1 refills | Status: AC

## 2024-11-16 MED ORDER — HYDROCHLOROTHIAZIDE 25 MG PO TABS: 25.0000 mg | ORAL_TABLET | Freq: Every day | ORAL | 1 refills | Status: AC

## 2024-11-16 MED ORDER — LORAZEPAM 0.5 MG PO TABS: 0.5000 mg | ORAL_TABLET | Freq: Two times a day (BID) | ORAL | 5 refills | Status: AC | PRN

## 2024-11-16 NOTE — Addendum Note (Signed)
 Addended by: GLADIS MUSTARD on: 11/16/2024 10:22 AM   Modules accepted: Level of Service

## 2024-11-16 NOTE — Progress Notes (Signed)
 "  Subjective:    Patient ID: Toni Cooper, female    DOB: Nov 18, 1946, 78 y.o.   MRN: [id number]   Chief Complaint: medical management of chronic issues     HPI:  Toni Cooper is a 78 y.o. who identifies as a female who was assigned female at birth.   Social history: Lives with: husband Work history: retired   Water Engineer in today for follow up of the following chronic medical issues:  1. Primary hypertension No c/o chest pain, sob or headache. Does not check blood pressure at home. BP Readings from Last 3 Encounters:  05/19/24 138/65  01/29/24 138/70  01/07/24 (!) 154/76     2. Diabetes mellitus treated with oral medication (HCC ) 3. Diabetes mellitus treated with injections of noninsulin medication (HCC) 4. Diabetes with insulin  therapy Fasting blood sugars running around 100-150. She is on lantus  daily as well as sliding scale as needed. She sees Dr. Tommas Lab Results  Component Value Date   HGBA1C 6.6 (H) 09/30/2024    5. Diabetic neuropathy No change  6. Hyperlipidemia associated with type 2 diabetes mellitus (HCC) Does not watch diet very closely but does no dedicated exercise. Lab Results  Component Value Date   CHOL 110 09/30/2024   HDL 43 09/30/2024   LDLCALC 52 09/30/2024   TRIG 70 09/30/2024   CHOLHDL 2.6 09/30/2024     7. HX of aortic valve replacement Last saw cardiology on 01/02/23. According to office note no changes were made to plan of care. She was instructed to follow up in one year.  8. Age-related osteoporosis without current pathological fracture Last dexascan was done on 11/28/16. Patient  no longer wants to do these.  9. GAD Is on ativan  daily as needed    08/20/2023    8:35 AM 02/19/2023    8:15 AM 09/11/2022    8:00 AM 04/05/2022    8:09 AM  GAD 7 : Generalized Anxiety Score  Nervous, Anxious, on Edge 0 0 0 0  Control/stop worrying 0 0 0 0  Worry too much - different things 0 0 0 0  Trouble relaxing 0 0 0 0  Restless 0 0 0 0  Easily  annoyed or irritable 0 0 0 0  Afraid - awful might happen 0 0 0 0  Total GAD 7 Score 0 0 0 0  Anxiety Difficulty Not difficult at all Not difficult at all Not difficult at all Not difficult at all      10. Class 2 severe obesity due to excess calories with serious comorbidity and body mass index (BMI) of 38.0 to 38.9 in adult [facility]) Weight is down 5lbs  Wt Readings from Last 3 Encounters:  11/16/24 188 lb (85.3 kg)  05/19/24 193 lb (87.5 kg)  01/29/24 192 lb (87.1 kg)   BMI Readings from Last 3 Encounters:  11/16/24 33.30 kg/m  05/19/24 34.19 kg/m  01/29/24 34.01 kg/m     New complaints: None today  Allergies  Allergen Reactions   Sulfa Antibiotics Nausea And Vomiting   Alendronate  Other (See Comments)   Sulfamethoxazole-Trimethoprim Other (See Comments)   Outpatient Encounter Medications as of 11/16/2024  Medication Sig   acetaminophen  (TYLENOL ) 500 MG tablet Take 500 mg 2 (two) times daily as needed by mouth for moderate pain or headache.    Apoaequorin (PREVAGEN PO) Take by mouth.   aspirin  81 MG EC tablet Take 1 tablet (81 mg total) by mouth daily. (Patient taking differently: Take 81 mg  by mouth in the morning and at bedtime.)   atorvastatin  (LIPITOR) 10 MG tablet Take 1 tablet (10 mg total) by mouth daily.   beta carotene w/minerals (OCUVITE) tablet Take 1 tablet by mouth daily.   calcium  citrate (CALCITRATE - DOSED IN MG ELEMENTAL CALCIUM ) 950 (200 Ca) MG tablet 1 tablet   cholecalciferol  (VITAMIN D ) 1000 UNITS tablet Take 1,000 Units by mouth daily.   cyanocobalamin  (VITAMIN B12) 1000 MCG/ML injection 1 ml weekly for 4 weeks then QOW for 4 weeks then monthly   denosumab  (PROLIA ) 60 MG/ML SOSY injection Inject 60 mg into the skin every 6 (six) months.   fluticasone (FLONASE) 50 MCG/ACT nasal spray 1 spray as needed.   guaifenesin  (HUMIBID E) 400 MG TABS tablet Take 1 tablet (400 mg total) by mouth every 6 (six) hours as needed.   hydrochlorothiazide  (HYDRODIURIL )  25 MG tablet Take 1 tablet (25 mg total) by mouth daily.   LANTUS  100 UNIT/ML injection Inject 35 Units into the skin at bedtime.    lisinopril  (ZESTRIL ) 20 MG tablet Take 1 tablet (20 mg total) by mouth daily.   LORazepam  (ATIVAN ) 0.5 MG tablet Take 1 tablet (0.5 mg total) by mouth 2 (two) times daily as needed for anxiety.   Magnesium  250 MG TABS Take 250 mg by mouth daily.    metFORMIN  (GLUCOPHAGE -XR) 500 MG 24 hr tablet Take 1 tablet (500 mg total) by mouth 2 (two) times daily.   metoprolol  tartrate (LOPRESSOR ) 25 MG tablet Take 0.5 tablets (12.5 mg total) by mouth 2 (two) times daily.   NOVOLOG  100 UNIT/ML injection Inject into the skin.   pyridOXINE  (VITAMIN B-6) 100 MG tablet Take 100 mg by mouth daily.   Semaglutide ,0.25 or 0.5MG /DOS, (OZEMPIC , 0.25 OR 0.5 MG/DOSE,) 2 MG/1.5ML SOPN Inject 0.5 mg into the skin once a week.   vitamin C  (ASCORBIC ACID ) 500 MG tablet Take 500 mg by mouth daily.   No facility-administered encounter medications on file as of 11/16/2024.    Past Surgical History:  Procedure Laterality Date   APPENDECTOMY     FEMUR FRACTURE SURGERY  07/30/2020   FRACTURE SURGERY     shoulder   TEE WITHOUT CARDIOVERSION N/A 04/18/2016   Procedure: TRANSESOPHAGEAL ECHOCARDIOGRAM (TEE);  Surgeon: Aleene JINNY Passe, MD;  Location: [facility] ENDOSCOPY;  Service: Cardiovascular;  Laterality: N/A;   TEE WITHOUT CARDIOVERSION N/A 10/16/2017   Procedure: TRANSESOPHAGEAL ECHOCARDIOGRAM (TEE);  Surgeon: Dusty Sudie DEL, MD;  Location: [facility] OR;  Service: Open Heart Surgery;  Laterality: N/A;   TOTAL ABDOMINAL HYSTERECTOMY     WRIST SURGERY      Family History  Problem Relation Age of Onset   Stroke Father 33   Diabetes Father    Retinopathy of prematurity Father    Heart failure Mother    Heart attack Maternal Grandmother    Heart attack Paternal Grandmother    Diabetes Paternal Aunt    Diabetes Paternal Uncle       Controlled substance contract: n/a     Review of Systems   Constitutional:  Negative for diaphoresis.  Eyes:  Negative for pain.  Respiratory:  Negative for shortness of breath.   Cardiovascular:  Negative for chest pain, palpitations and leg swelling.  Gastrointestinal:  Negative for abdominal pain.  Endocrine: Negative for polydipsia.  Skin:  Negative for rash.  Neurological:  Negative for dizziness, weakness and headaches.  Hematological:  Does not bruise/bleed easily.  All other systems reviewed and are negative.  Objective:   Physical Exam Vitals and nursing note reviewed.  Constitutional:      General: She is not in acute distress.    Appearance: Normal appearance. She is well-developed.  HENT:     Head: Normocephalic.     Right Ear: Tympanic membrane normal.     Left Ear: Tympanic membrane normal.     Nose: Nose normal.     Mouth/Throat:     Mouth: Mucous membranes are moist.  Eyes:     Pupils: Pupils are equal, round, and reactive to light.  Neck:     Vascular: No carotid bruit or JVD.  Cardiovascular:     Rate and Rhythm: Normal rate and regular rhythm.     Heart sounds: Murmur (3/6 murmur) heard.  Pulmonary:     Effort: Pulmonary effort is normal. No respiratory distress.     Breath sounds: Normal breath sounds. No wheezing or rales.  Chest:     Chest wall: No tenderness.  Abdominal:     General: Bowel sounds are normal. There is no distension or abdominal bruit.     Palpations: Abdomen is soft. There is no hepatomegaly, splenomegaly, mass or pulsatile mass.     Tenderness: There is no abdominal tenderness.  Musculoskeletal:        General: Normal range of motion.     Cervical back: Normal range of motion and neck supple.  Lymphadenopathy:     Cervical: No cervical adenopathy.  Skin:    General: Skin is warm and dry.  Neurological:     Mental Status: She is alert and oriented to person, place, and time.     Deep Tendon Reflexes: Reflexes are normal and symmetric.  Psychiatric:        Behavior: Behavior  normal.        Thought Content: Thought content normal.        Judgment: Judgment normal.     BP 136/86   Pulse 71   Temp 97.7 F (36.5 C) (Temporal)   Ht 5' 3 (1.6 m)   Wt 188 lb (85.3 kg)   SpO2 98%   BMI 33.30 kg/m    HGBA1c 6.7%     Assessment & Plan:  DARYA BIGLER comes in today with chief complaint of Medical Management of Chronic Issues   Diagnosis and orders addressed:  1. Primary hypertension (Primary) Dash diet - hydrochlorothiazide  (HYDRODIURIL ) 25 MG tablet; Take 1 tablet (25 mg total) by mouth daily.  Dispense: 90 tablet; Refill: 1 - lisinopril  (ZESTRIL ) 20 MG tablet; Take 1 tablet (20 mg total) by mouth daily.  Dispense: 90 tablet; Refill: 1  2. Diabetes mellitus treated with oral medication (HCC) 3. Diabetes mellitus treated with injections of non-insulin  medication (HCC) 4. Type 1 diabetes mellitus on insulin  therapy (HCC) Watch carbs in diet and keep follow up with Dr. Balan - insulin  glargine (LANTUS ) 100 UNIT/ML injection; Inject 0.35 mLs (35 Units total) into the skin at bedtime. - NOVOLOG  100 UNIT/ML injection; Inject into the skin.  5. Type 2 diabetes mellitus with diabetic neuropathy, without long-term current use of insulin  (HCC) Do not go barefooted Check feet daioly  6. Hyperlipidemia associated with type 2 diabetes mellitus (HCC) Low fat diet - atorvastatin  (LIPITOR) 10 MG tablet; Take 1 tablet (10 mg total) by mouth daily.  Dispense: 90 tablet; Refill: 1 - metoprolol  tartrate (LOPRESSOR ) 25 MG tablet; Take 0.5 tablets (12.5 mg total) by mouth 2 (two) times daily.  Dispense: 90 tablet; Refill: 1  7.  History of aortic valve replacement with bioprosthetic valve Keep folllow up withy cardiology  8. Age-related osteoporosis without current pathological fracture Weight bearing exercise - denosumab  (PROLIA ) 60 MG/ML SOSY injection; Inject 60 mg into the skin every 6 (six) months.  9. Class 2 severe obesity due to excess calories with serious  comorbidity and body mass index (BMI) of 38.0 to 38.9 in adult Discussed diet and exercise for person with BMI >25 Will recheck weight in 3-6 months   10. Anxiety Stress management - LORazepam  (ATIVAN ) 0.5 MG tablet; Take 1 tablet (0.5 mg total) by mouth 2 (two) times daily as needed for anxiety.  Dispense: 60 tablet; Refill: 5   Labs pending Health Maintenance reviewed Diet and exercise encouraged  Follow up plan: 6 months   Mary-Margaret Gladis, FNP  "

## 2025-02-03 ENCOUNTER — Ambulatory Visit: Admitting: Cardiology

## 2025-05-17 ENCOUNTER — Ambulatory Visit: Admitting: Nurse Practitioner
